# Patient Record
Sex: Male | Born: 1940
Health system: Southern US, Community
[De-identification: ages and names within clinical notes are randomized; demographics above are authoritative.]

## PROBLEM LIST (undated history)

## (undated) DIAGNOSIS — M545 Low back pain: Secondary | ICD-10-CM

## (undated) DIAGNOSIS — E119 Type 2 diabetes mellitus without complications: Secondary | ICD-10-CM

## (undated) DIAGNOSIS — E1140 Type 2 diabetes mellitus with diabetic neuropathy, unspecified: Secondary | ICD-10-CM

## (undated) DIAGNOSIS — B354 Tinea corporis: Secondary | ICD-10-CM

## (undated) DIAGNOSIS — N529 Male erectile dysfunction, unspecified: Secondary | ICD-10-CM

## (undated) DIAGNOSIS — E1121 Type 2 diabetes mellitus with diabetic nephropathy: Secondary | ICD-10-CM

## (undated) DIAGNOSIS — Z0000 Encounter for general adult medical examination without abnormal findings: Secondary | ICD-10-CM

## (undated) DIAGNOSIS — E785 Hyperlipidemia, unspecified: Secondary | ICD-10-CM

## (undated) DIAGNOSIS — I10 Essential (primary) hypertension: Secondary | ICD-10-CM

## (undated) DIAGNOSIS — R972 Elevated prostate specific antigen [PSA]: Secondary | ICD-10-CM

## (undated) DIAGNOSIS — E782 Mixed hyperlipidemia: Secondary | ICD-10-CM

## (undated) DIAGNOSIS — R3911 Hesitancy of micturition: Secondary | ICD-10-CM

## (undated) DIAGNOSIS — D649 Anemia, unspecified: Secondary | ICD-10-CM

## (undated) HISTORY — DX: Hesitancy of micturition: R39.11

## (undated) HISTORY — DX: Type 2 diabetes mellitus with diabetic nephropathy: E11.21

## (undated) HISTORY — DX: Elevated prostate specific antigen (PSA): R97.20

## (undated) HISTORY — DX: Low back pain: M54.5

## (undated) HISTORY — DX: Type 2 diabetes mellitus with diabetic neuropathy, unspecified: E11.40

## (undated) HISTORY — DX: Anemia, unspecified: D64.9

## (undated) HISTORY — DX: Encounter for general adult medical examination without abnormal findings: Z00.00

## (undated) HISTORY — DX: Hyperlipidemia, unspecified: E78.5

## (undated) HISTORY — PX: CATARACT EXTRACTION, BILATERAL: SHX1313

## (undated) HISTORY — DX: Type 2 diabetes mellitus without complications: E11.9

## (undated) HISTORY — DX: Mixed hyperlipidemia: E78.2

## (undated) HISTORY — PX: SKIN GRAFT: SHX250

## (undated) HISTORY — DX: Essential (primary) hypertension: I10

## (undated) HISTORY — DX: Male erectile dysfunction, unspecified: N52.9

## (undated) HISTORY — DX: Tinea corporis: B35.4

## (undated) HISTORY — PX: EYE SURGERY: SHX253

---

## 1988-06-05 DIAGNOSIS — E119 Type 2 diabetes mellitus without complications: Secondary | ICD-10-CM

## 1988-06-05 HISTORY — DX: Type 2 diabetes mellitus without complications: E11.9

## 1998-01-11 ENCOUNTER — Encounter: Admission: RE | Admit: 1998-01-11 | Discharge: 1998-01-11 | Payer: Self-pay | Admitting: Family Medicine

## 1998-02-04 ENCOUNTER — Encounter: Admission: RE | Admit: 1998-02-04 | Discharge: 1998-02-04 | Payer: Self-pay | Admitting: Family Medicine

## 1998-03-30 ENCOUNTER — Encounter: Admission: RE | Admit: 1998-03-30 | Discharge: 1998-03-30 | Payer: Self-pay | Admitting: Sports Medicine

## 1998-08-24 ENCOUNTER — Encounter: Admission: RE | Admit: 1998-08-24 | Discharge: 1998-08-24 | Payer: Self-pay | Admitting: Family Medicine

## 1998-11-16 ENCOUNTER — Emergency Department (HOSPITAL_COMMUNITY): Admission: EM | Admit: 1998-11-16 | Discharge: 1998-11-16 | Payer: Self-pay | Admitting: Emergency Medicine

## 1999-02-03 ENCOUNTER — Encounter: Admission: RE | Admit: 1999-02-03 | Discharge: 1999-02-03 | Payer: Self-pay | Admitting: Family Medicine

## 1999-03-10 ENCOUNTER — Encounter: Admission: RE | Admit: 1999-03-10 | Discharge: 1999-03-10 | Payer: Self-pay | Admitting: Family Medicine

## 1999-04-08 ENCOUNTER — Encounter: Admission: RE | Admit: 1999-04-08 | Discharge: 1999-04-08 | Payer: Self-pay | Admitting: Family Medicine

## 1999-09-13 ENCOUNTER — Encounter: Admission: RE | Admit: 1999-09-13 | Discharge: 1999-09-13 | Payer: Self-pay | Admitting: Family Medicine

## 1999-09-15 ENCOUNTER — Encounter: Admission: RE | Admit: 1999-09-15 | Discharge: 1999-09-15 | Payer: Self-pay | Admitting: Family Medicine

## 1999-09-19 ENCOUNTER — Encounter: Admission: RE | Admit: 1999-09-19 | Discharge: 1999-12-18 | Payer: Self-pay | Admitting: *Deleted

## 1999-10-20 ENCOUNTER — Encounter: Admission: RE | Admit: 1999-10-20 | Discharge: 1999-10-20 | Payer: Self-pay | Admitting: Family Medicine

## 2000-01-28 ENCOUNTER — Emergency Department (HOSPITAL_COMMUNITY): Admission: EM | Admit: 2000-01-28 | Discharge: 2000-01-28 | Payer: Self-pay | Admitting: Emergency Medicine

## 2000-05-31 ENCOUNTER — Encounter: Admission: RE | Admit: 2000-05-31 | Discharge: 2000-05-31 | Payer: Self-pay | Admitting: Family Medicine

## 2000-08-15 ENCOUNTER — Encounter: Admission: RE | Admit: 2000-08-15 | Discharge: 2000-08-15 | Payer: Self-pay | Admitting: Family Medicine

## 2000-10-31 ENCOUNTER — Encounter: Payer: Self-pay | Admitting: Emergency Medicine

## 2000-10-31 ENCOUNTER — Emergency Department (HOSPITAL_COMMUNITY): Admission: EM | Admit: 2000-10-31 | Discharge: 2000-10-31 | Payer: Self-pay | Admitting: Emergency Medicine

## 2001-10-06 ENCOUNTER — Emergency Department (HOSPITAL_COMMUNITY): Admission: EM | Admit: 2001-10-06 | Discharge: 2001-10-06 | Payer: Self-pay | Admitting: *Deleted

## 2004-12-02 ENCOUNTER — Emergency Department (HOSPITAL_COMMUNITY): Admission: EM | Admit: 2004-12-02 | Discharge: 2004-12-03 | Payer: Self-pay | Admitting: Emergency Medicine

## 2004-12-29 ENCOUNTER — Ambulatory Visit (HOSPITAL_COMMUNITY): Admission: RE | Admit: 2004-12-29 | Discharge: 2004-12-30 | Payer: Self-pay | Admitting: Ophthalmology

## 2004-12-29 IMAGING — CR DG CHEST 2V
2 series · 2 of 2 positions shown · non-contrast
Comparison: Report dated [DATE].

CLINICAL DATA: Cough. Upper respiratory tract infection. Hypertension. Vitreous
hemorrhage. Preoperative evaluation.

CHEST - 2 VIEW

[view not recorded (1 of 2)]
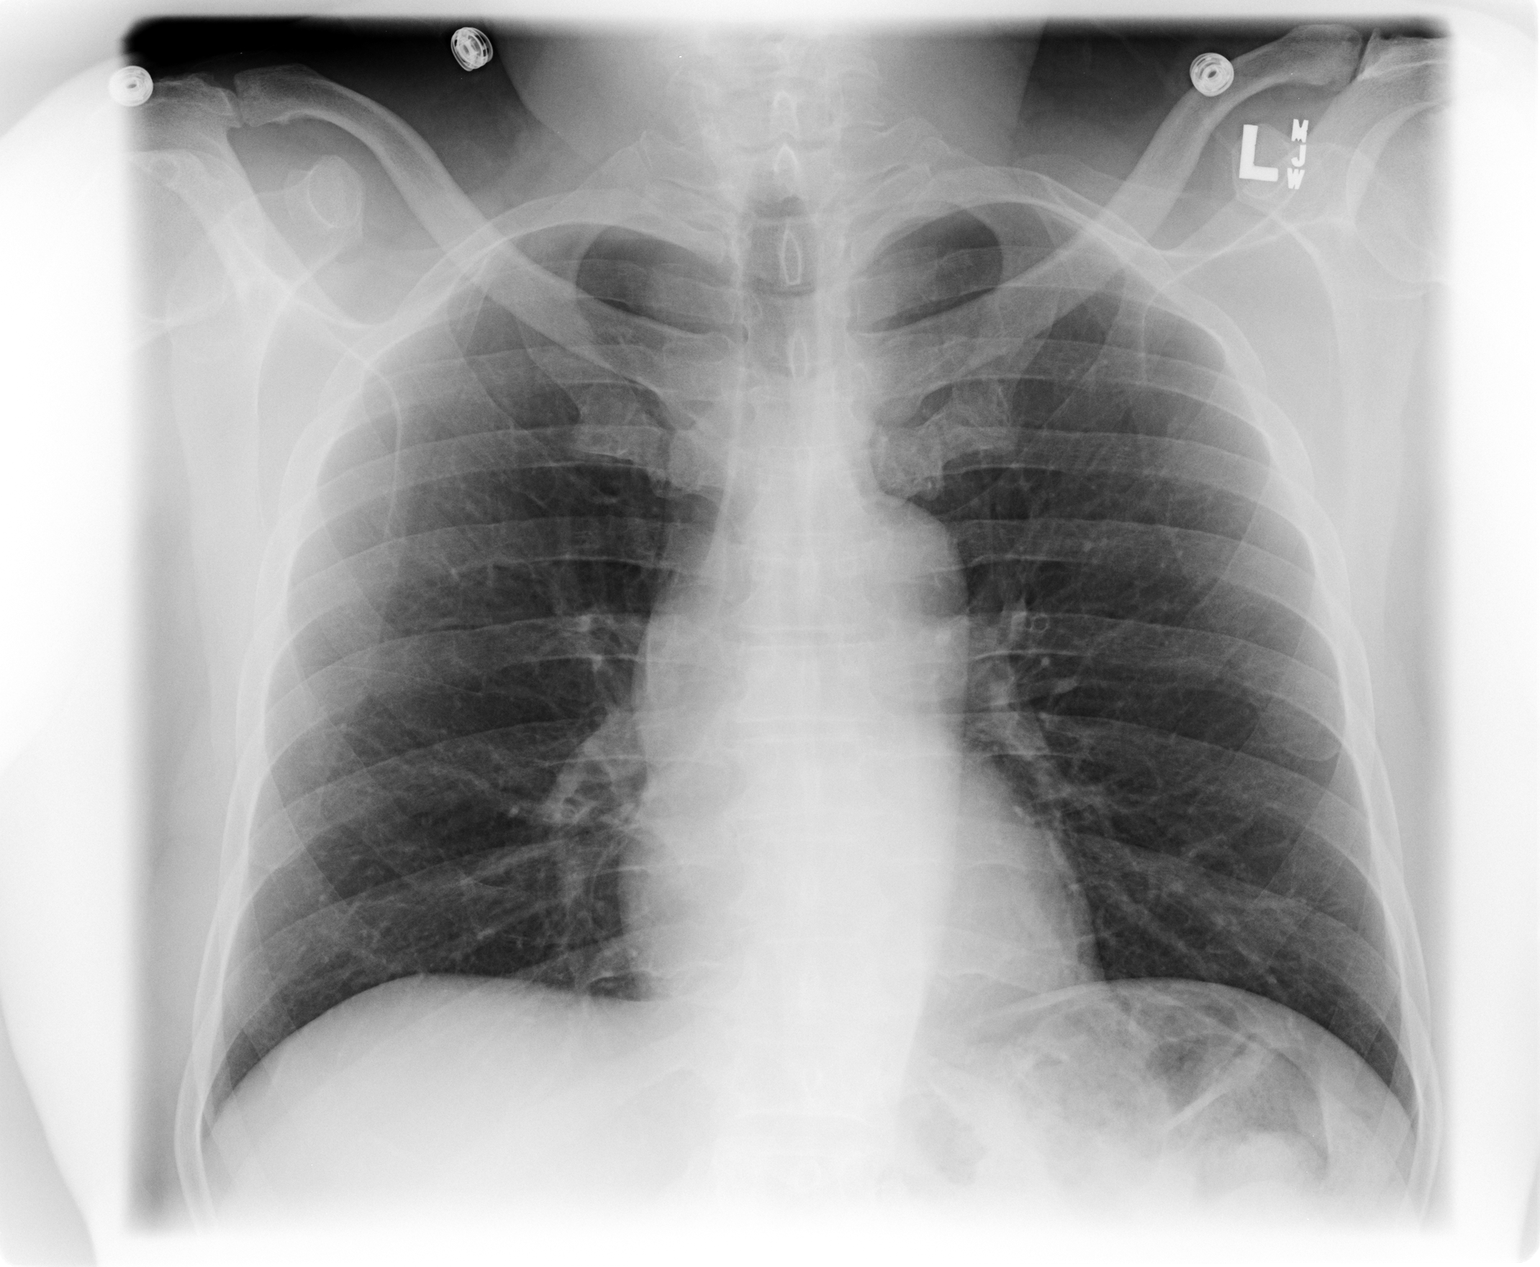

[view not recorded (2 of 2)]
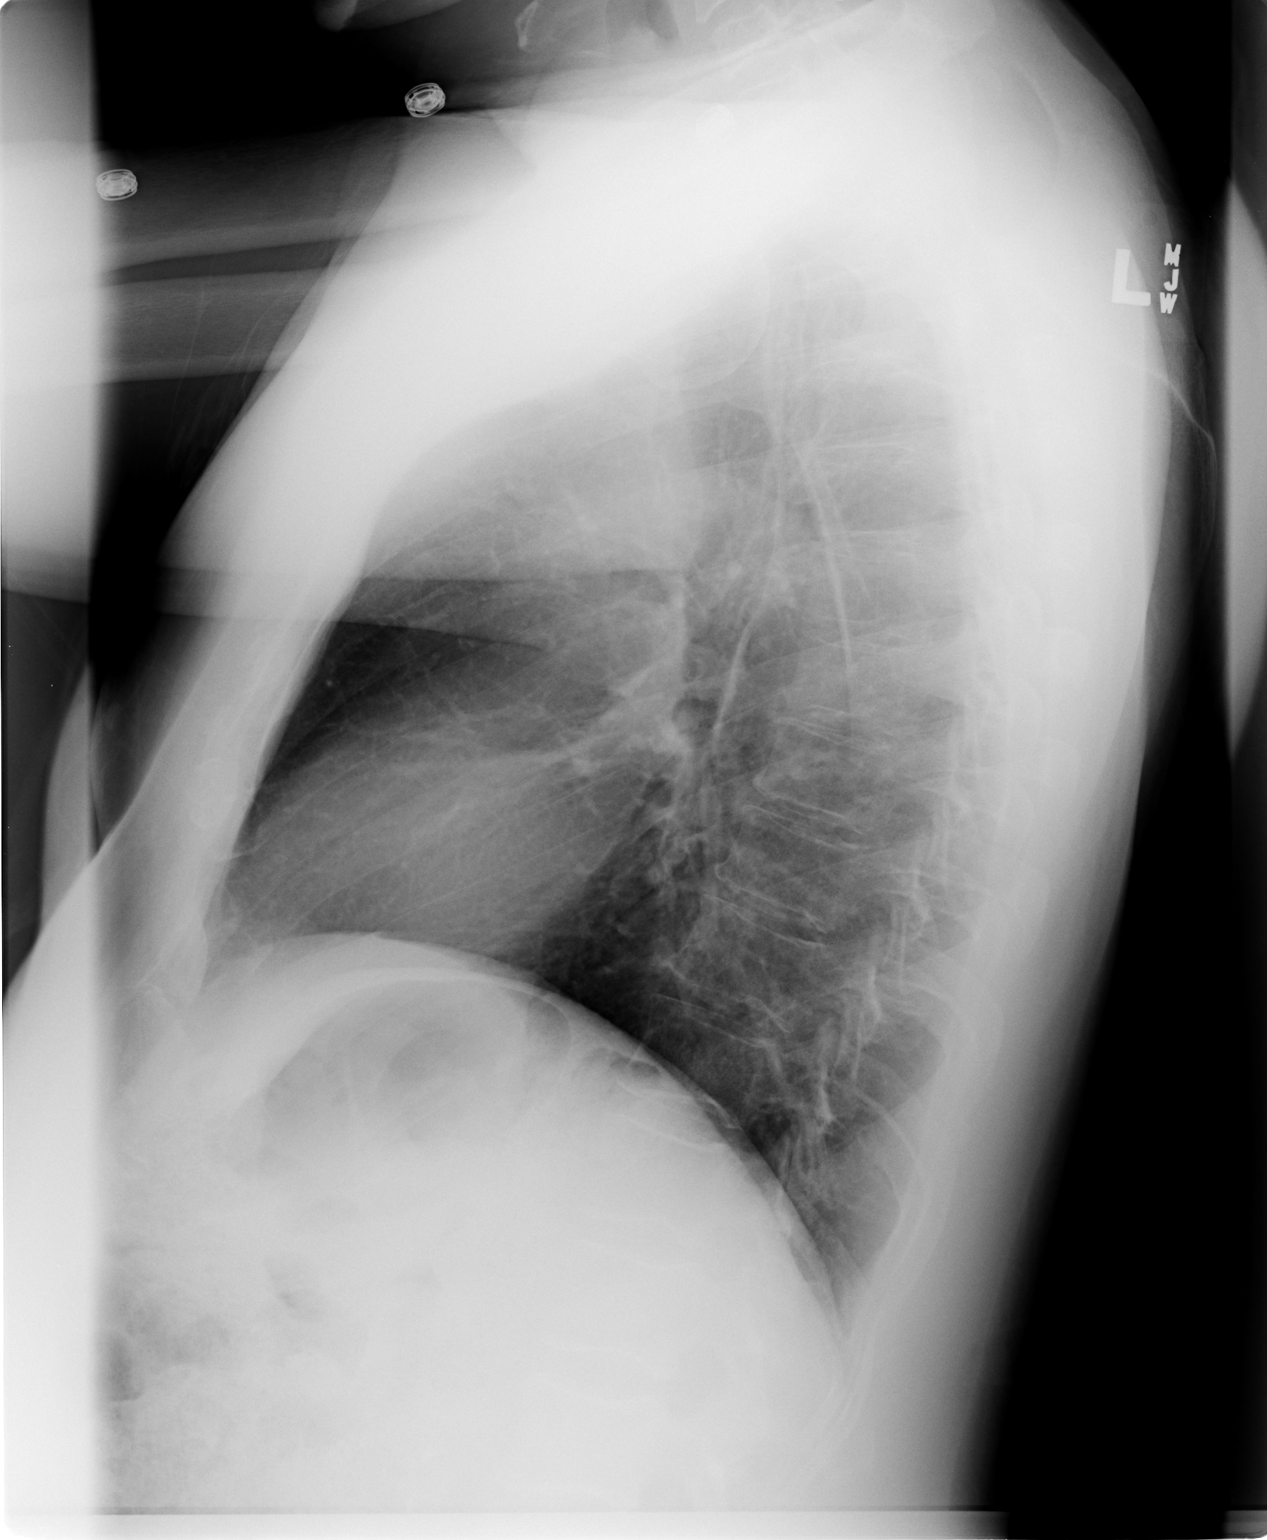

[2 of 2 positions shown; findings below may reference images not displayed]

FINDINGS: The heart remains normal in size. Mild diffuse peribronchial
thickening is again demonstrated. The aorta remains mildly tortuous with mild
prominence of the ascending thoracic aorta and aortic arch. Mild thoracic spine
degenerative changes.

IMPRESSION

1. Mild chronic bronchitic changes.

2. Previously described tortuous aorta with mild prominence of the ascending
thoracic aorta and aortic arch.

## 2005-02-21 ENCOUNTER — Ambulatory Visit (HOSPITAL_COMMUNITY): Admission: RE | Admit: 2005-02-21 | Discharge: 2005-02-22 | Payer: Self-pay | Admitting: Ophthalmology

## 2005-07-15 ENCOUNTER — Emergency Department (HOSPITAL_COMMUNITY): Admission: EM | Admit: 2005-07-15 | Discharge: 2005-07-15 | Payer: Self-pay | Admitting: Emergency Medicine

## 2008-06-15 ENCOUNTER — Ambulatory Visit: Payer: Self-pay | Admitting: *Deleted

## 2008-06-15 DIAGNOSIS — E1165 Type 2 diabetes mellitus with hyperglycemia: Secondary | ICD-10-CM

## 2008-06-15 DIAGNOSIS — E1151 Type 2 diabetes mellitus with diabetic peripheral angiopathy without gangrene: Secondary | ICD-10-CM | POA: Insufficient documentation

## 2008-06-15 DIAGNOSIS — E782 Mixed hyperlipidemia: Secondary | ICD-10-CM

## 2008-06-15 DIAGNOSIS — E1139 Type 2 diabetes mellitus with other diabetic ophthalmic complication: Secondary | ICD-10-CM

## 2008-06-15 DIAGNOSIS — E1149 Type 2 diabetes mellitus with other diabetic neurological complication: Secondary | ICD-10-CM

## 2008-06-15 DIAGNOSIS — I10 Essential (primary) hypertension: Secondary | ICD-10-CM | POA: Insufficient documentation

## 2008-06-15 HISTORY — DX: Mixed hyperlipidemia: E78.2

## 2008-06-15 LAB — CONVERTED CEMR LAB
ALT: 37 units/L (ref 0–53)
AST: 34 units/L (ref 0–37)
Albumin: 3.6 g/dL (ref 3.5–5.2)
Alkaline Phosphatase: 51 units/L (ref 39–117)
BUN: 26 mg/dL — ABNORMAL HIGH (ref 6–23)
Basophils Absolute: 0 10*3/uL (ref 0.0–0.1)
Basophils Relative: 0.3 % (ref 0.0–3.0)
CO2: 28 meq/L (ref 19–32)
Calcium: 9.5 mg/dL (ref 8.4–10.5)
Chloride: 107 meq/L (ref 96–112)
Creatinine, Ser: 1.1 mg/dL (ref 0.4–1.5)
Creatinine,U: 75.7 mg/dL
Eosinophils Absolute: 0.2 10*3/uL (ref 0.0–0.7)
Eosinophils Relative: 2.9 % (ref 0.0–5.0)
GFR calc Af Amer: 86 mL/min
GFR calc non Af Amer: 71 mL/min
Glucose, Bld: 296 mg/dL — ABNORMAL HIGH (ref 70–99)
HCT: 36.4 % — ABNORMAL LOW (ref 39.0–52.0)
Hemoglobin: 12.2 g/dL — ABNORMAL LOW (ref 13.0–17.0)
Hgb A1c MFr Bld: 10.1 % — ABNORMAL HIGH (ref 4.6–6.0)
Lymphocytes Relative: 21.4 % (ref 12.0–46.0)
MCHC: 33.5 g/dL (ref 30.0–36.0)
MCV: 86.3 fL (ref 78.0–100.0)
Microalb Creat Ratio: 236.5 mg/g — ABNORMAL HIGH (ref 0.0–30.0)
Microalb, Ur: 17.9 mg/dL — ABNORMAL HIGH (ref 0.0–1.9)
Monocytes Absolute: 0.5 10*3/uL (ref 0.1–1.0)
Monocytes Relative: 8.5 % (ref 3.0–12.0)
Neutro Abs: 4.1 10*3/uL (ref 1.4–7.7)
Neutrophils Relative %: 66.9 % (ref 43.0–77.0)
Platelets: 216 10*3/uL (ref 150–400)
Potassium: 4.5 meq/L (ref 3.5–5.1)
RBC: 4.22 M/uL (ref 4.22–5.81)
RDW: 12.6 % (ref 11.5–14.6)
Sodium: 140 meq/L (ref 135–145)
TSH: 0.9 microintl units/mL (ref 0.35–5.50)
Total Bilirubin: 0.9 mg/dL (ref 0.3–1.2)
Total Protein: 7.2 g/dL (ref 6.0–8.3)
WBC: 6.1 10*3/uL (ref 4.5–10.5)

## 2008-06-17 DIAGNOSIS — R809 Proteinuria, unspecified: Secondary | ICD-10-CM

## 2008-06-17 DIAGNOSIS — D649 Anemia, unspecified: Secondary | ICD-10-CM | POA: Insufficient documentation

## 2008-06-18 ENCOUNTER — Ambulatory Visit: Payer: Self-pay | Admitting: *Deleted

## 2008-06-18 DIAGNOSIS — N529 Male erectile dysfunction, unspecified: Secondary | ICD-10-CM

## 2008-06-18 DIAGNOSIS — R209 Unspecified disturbances of skin sensation: Secondary | ICD-10-CM

## 2008-06-19 LAB — CONVERTED CEMR LAB
Folate: 10.6 ng/mL
Testosterone: 476.37 ng/dL (ref 350.00–890)
Vitamin B-12: 497 pg/mL (ref 211–911)

## 2008-06-26 ENCOUNTER — Encounter (INDEPENDENT_AMBULATORY_CARE_PROVIDER_SITE_OTHER): Payer: Self-pay | Admitting: *Deleted

## 2008-07-03 ENCOUNTER — Ambulatory Visit: Payer: Self-pay | Admitting: Endocrinology

## 2008-07-03 ENCOUNTER — Telehealth (INDEPENDENT_AMBULATORY_CARE_PROVIDER_SITE_OTHER): Payer: Self-pay | Admitting: *Deleted

## 2008-07-21 ENCOUNTER — Ambulatory Visit: Payer: Self-pay | Admitting: *Deleted

## 2008-07-21 LAB — CONVERTED CEMR LAB
ALT: 27 units/L (ref 0–53)
AST: 25 units/L (ref 0–37)
Albumin: 3.6 g/dL (ref 3.5–5.2)
Alkaline Phosphatase: 52 units/L (ref 39–117)
BUN: 18 mg/dL (ref 6–23)
Basophils Absolute: 0 10*3/uL (ref 0.0–0.1)
Basophils Relative: 0 % (ref 0.0–3.0)
CO2: 29 meq/L (ref 19–32)
Calcium: 9.5 mg/dL (ref 8.4–10.5)
Chloride: 107 meq/L (ref 96–112)
Cholesterol: 133 mg/dL (ref 0–200)
Creatinine, Ser: 1.1 mg/dL (ref 0.4–1.5)
Eosinophils Absolute: 0.2 10*3/uL (ref 0.0–0.7)
Eosinophils Relative: 3.9 % (ref 0.0–5.0)
GFR calc Af Amer: 86 mL/min
GFR calc non Af Amer: 71 mL/min
Glucose, Bld: 312 mg/dL — ABNORMAL HIGH (ref 70–99)
HCT: 36.9 % — ABNORMAL LOW (ref 39.0–52.0)
HDL: 36 mg/dL — ABNORMAL LOW (ref >39.0)
Hemoglobin: 12.8 g/dL — ABNORMAL LOW (ref 13.0–17.0)
Iron: 82 ug/dL (ref 42–165)
LDL Cholesterol: 81 mg/dL (ref 0–99)
Lymphocytes Relative: 21.5 % (ref 12.0–46.0)
MCHC: 34.6 g/dL (ref 30.0–36.0)
MCV: 84.1 fL (ref 78.0–100.0)
Monocytes Absolute: 0.5 10*3/uL (ref 0.1–1.0)
Monocytes Relative: 8.3 % (ref 3.0–12.0)
Neutro Abs: 4.2 10*3/uL (ref 1.4–7.7)
Neutrophils Relative %: 66.3 % (ref 43.0–77.0)
Platelets: 228 10*3/uL (ref 150–400)
Potassium: 4.8 meq/L (ref 3.5–5.1)
RBC: 4.38 M/uL (ref 4.22–5.81)
RDW: 12.6 % (ref 11.5–14.6)
Saturation Ratios: 28.7 % (ref 20.0–50.0)
Sodium: 142 meq/L (ref 135–145)
Total Bilirubin: 0.8 mg/dL (ref 0.3–1.2)
Total CHOL/HDL Ratio: 3.7
Total Protein: 7.5 g/dL (ref 6.0–8.3)
Transferrin: 204.3 mg/dL — ABNORMAL LOW (ref >=212.0)
Triglycerides: 82 mg/dL (ref 0–149)
VLDL: 16 mg/dL (ref 0–40)
Vitamin B-12: 456 pg/mL (ref 211–911)
WBC: 6.2 10*3/uL (ref 4.5–10.5)

## 2008-08-25 ENCOUNTER — Ambulatory Visit: Payer: Self-pay | Admitting: Endocrinology

## 2008-08-25 DIAGNOSIS — J309 Allergic rhinitis, unspecified: Secondary | ICD-10-CM | POA: Insufficient documentation

## 2008-08-25 LAB — CONVERTED CEMR LAB: Hgb A1c MFr Bld: 9.7 % — ABNORMAL HIGH (ref 4.6–6.5)

## 2008-10-05 ENCOUNTER — Telehealth (INDEPENDENT_AMBULATORY_CARE_PROVIDER_SITE_OTHER): Payer: Self-pay | Admitting: *Deleted

## 2008-11-24 ENCOUNTER — Ambulatory Visit: Payer: Self-pay | Admitting: Endocrinology

## 2008-11-24 LAB — CONVERTED CEMR LAB: Hgb A1c MFr Bld: 9 % — ABNORMAL HIGH (ref 4.6–6.5)

## 2008-12-09 ENCOUNTER — Ambulatory Visit: Payer: Self-pay | Admitting: Internal Medicine

## 2008-12-17 ENCOUNTER — Telehealth (INDEPENDENT_AMBULATORY_CARE_PROVIDER_SITE_OTHER): Payer: Self-pay | Admitting: *Deleted

## 2009-02-04 ENCOUNTER — Telehealth: Payer: Self-pay | Admitting: Endocrinology

## 2009-02-05 ENCOUNTER — Ambulatory Visit: Payer: Self-pay | Admitting: Internal Medicine

## 2009-02-05 LAB — CONVERTED CEMR LAB
BUN: 27 mg/dL — ABNORMAL HIGH (ref 6–23)
CO2: 30 meq/L (ref 19–32)
Calcium: 9.7 mg/dL (ref 8.4–10.5)
Chloride: 107 meq/L (ref 96–112)
Creatinine, Ser: 1.3 mg/dL (ref 0.4–1.5)
GFR calc non Af Amer: 71.06 mL/min (ref >60)
Glucose, Bld: 273 mg/dL — ABNORMAL HIGH (ref 70–99)
Potassium: 4.4 meq/L (ref 3.5–5.1)
Sodium: 144 meq/L (ref 135–145)

## 2009-02-09 ENCOUNTER — Ambulatory Visit: Payer: Self-pay | Admitting: Internal Medicine

## 2009-02-09 DIAGNOSIS — J069 Acute upper respiratory infection, unspecified: Secondary | ICD-10-CM | POA: Insufficient documentation

## 2009-02-11 ENCOUNTER — Encounter: Payer: Self-pay | Admitting: Internal Medicine

## 2009-03-16 ENCOUNTER — Telehealth: Payer: Self-pay | Admitting: Internal Medicine

## 2009-04-07 ENCOUNTER — Ambulatory Visit: Payer: Self-pay | Admitting: Endocrinology

## 2009-04-07 LAB — CONVERTED CEMR LAB
Creatinine,U: 134.8 mg/dL
Microalb, Ur: 65.8 mg/dL — ABNORMAL HIGH (ref 0.0–1.9)

## 2009-04-15 ENCOUNTER — Ambulatory Visit: Payer: Self-pay | Admitting: Internal Medicine

## 2009-05-07 ENCOUNTER — Ambulatory Visit: Payer: Self-pay | Admitting: Endocrinology

## 2009-10-20 ENCOUNTER — Telehealth: Payer: Self-pay | Admitting: Internal Medicine

## 2009-11-05 ENCOUNTER — Ambulatory Visit: Payer: Self-pay | Admitting: Internal Medicine

## 2010-01-03 ENCOUNTER — Encounter: Payer: Self-pay | Admitting: Internal Medicine

## 2010-05-17 ENCOUNTER — Telehealth: Payer: Self-pay | Admitting: Internal Medicine

## 2010-05-19 ENCOUNTER — Ambulatory Visit: Payer: Self-pay | Admitting: Internal Medicine

## 2010-05-23 ENCOUNTER — Telehealth: Payer: Self-pay | Admitting: Internal Medicine

## 2010-06-08 ENCOUNTER — Ambulatory Visit: Admit: 2010-06-08 | Payer: Self-pay | Admitting: Endocrinology

## 2010-06-10 ENCOUNTER — Ambulatory Visit
Admission: RE | Admit: 2010-06-10 | Discharge: 2010-06-10 | Payer: Self-pay | Source: Home / Self Care | Attending: Endocrinology | Admitting: Endocrinology

## 2010-06-10 ENCOUNTER — Other Ambulatory Visit: Payer: Self-pay | Admitting: Endocrinology

## 2010-06-10 LAB — HEMOGLOBIN A1C: Hgb A1c MFr Bld: 10 % — ABNORMAL HIGH (ref 4.6–6.5)

## 2010-06-21 ENCOUNTER — Ambulatory Visit: Admit: 2010-06-21 | Payer: Self-pay | Admitting: Internal Medicine

## 2010-06-21 DIAGNOSIS — Z0289 Encounter for other administrative examinations: Secondary | ICD-10-CM

## 2010-07-05 NOTE — Medication Information (Signed)
Summary: Order for Diabetic Supplies  Order for Diabetic Supplies   Imported By: Laural Benes 01/11/2010 14:17:47  _____________________________________________________________________  External Attachment:    Type:   Image     Comment:   External Document

## 2010-07-05 NOTE — Progress Notes (Signed)
Summary: Lisinopril Refill  Phone Note Refill Request Message from:  Fax from Pharmacy on Oct 20, 2009 9:03 AM  Refills Requested: Medication #1:  LISINOPRIL-HYDROCHLOROTHIAZIDE 20-12.5 MG TABS 2 by mouth q am.   Dosage confirmed as above?Dosage Confirmed   Brand Name Necessary? No   Supply Requested: 1 month   Last Refilled: 08/09/2009  Method Requested: Electronic Next Appointment Scheduled: None Initial call taken by: Jiles Garter CMA,  Oct 20, 2009 9:04 AM  Follow-up for Phone Call        refill x 1.  needs ov for addt'l refills Follow-up by: D. Drema Pry DO,  Oct 20, 2009 12:10 PM  Additional Follow-up for Phone Call Additional follow up Details #1::        calll placed to patient at [phone], wife stated patient  was not available. She was advised to inform patient office visit was needed prior to future refills. Patients wife has scheduled office visit for 10/26/2009 Additional Follow-up by: Jiles Garter CMA,  Oct 20, 2009 12:27 PM    Prescriptions: LISINOPRIL-HYDROCHLOROTHIAZIDE 20-12.5 MG TABS (LISINOPRIL-HYDROCHLOROTHIAZIDE) 2 by mouth q am  #60 x 0   Entered by:   Jiles Garter CMA   Authorized by:   D. Drema Pry DO   Signed by:   Jiles Garter CMA on 10/20/2009   Method used:   Electronically to        [facility]. [id number]* (retail)       [address].       [place], [place]  [address]       Ph: [phone] or [phone]       Fax: [phone]   RxID:   [id number]

## 2010-07-07 NOTE — Progress Notes (Signed)
Summary: refill--amlodipine, carvedilol  Phone Note Refill Request Message from:  Fax from [facility] on May 17, 2010 10:29 AM  Refills Requested: Medication #1:  CARVEDILOL 25 MG TABS one by mouth two times a day   Dosage confirmed as above?Dosage Confirmed   Supply Requested: 3 months   Last Refilled: 04/11/2009   Notes: Request was for 12.5mg  1 twice a day. This was changed on 04/15/09  Medication #2:  AMLODIPINE BESYLATE 10 MG TABS one tablet once daily   Dosage confirmed as above?Dosage Confirmed   Supply Requested: 3 months   Last Refilled: 12/09/2008 Pt last seen 04/15/09.  Benazepril requested but pt was taken off of this 04/15/09.  Next Appointment Scheduled: 05/19/10 Dr Shawna Orleans Initial call taken by: Kelle Darting CMA Deborra Medina),  May 17, 2010 10:31 AM  Follow-up for Phone Call        ok to refill amlodipine and carvedilol plz confirm with pt he is taking lisinopril/hctz instead of benazepril Follow-up by: D. Drema Pry DO,  May 17, 2010 12:59 PM  Additional Follow-up for Phone Call Additional follow up Details #1::        Left message for pt to return my call. Gilmore Laroche Fergerson CMA ([place])  May 17, 2010 1:26 PM   Pt returned my call. Pt last took Lisinopril-HCT in 11/2009.  Still had bottle from June. Has recently been taking Benazepril.  Advised pt he should not be taking Benazepril.  Needs to start Lisinopril--HCT.  Will send Lisinopril-HCT refill to [facility] at pt's request.  Advised pt to keep f/u on 05/19/10 with Dr Shawna Orleans.  Called [facility] and spoke to [facility] to verify last refill dates.  Refilled Lisinopril--HCT on 10/20/09.  Benazepril has been deactivated on pt's profile.  Spoke to [place] at [facility] and verified that pt received refills on Amlodipine, Carvedilol, Lipitor and Benazepril last in 5/11 for 3 months supply on each. They did not have Lisinopril-HCT Rx. Med refilled.  Gilmore Laroche Fergerson CMA Deborra Medina)  May 17, 2010 2:38 PM       Prescriptions: LISINOPRIL-HYDROCHLOROTHIAZIDE 20-12.5 MG TABS (LISINOPRIL-HYDROCHLOROTHIAZIDE) 2 by mouth q am  #180 x 3   Entered by:   Kelle Darting CMA ([place])   Authorized by:   D. Drema Pry DO   Signed by:   Kelle Darting CMA ([place]) on 05/17/2010   Method used:   Faxed to ...       [facility] (mail-order)             , [place]         Ph: [phone]       Fax: [phone]   RxID:   [id number] CARVEDILOL 25 MG TABS (CARVEDILOL) one by mouth two times a day  #180 x 3   Entered by:   Kelle Darting CMA ([place])   Authorized by:   D. Drema Pry DO   Signed by:   Kelle Darting CMA ([place]) on 05/17/2010   Method used:   Faxed to ...       [facility] (mail-order)             , [place]         Ph: [phone]       Fax: [phone]   RxID:   [id number] AMLODIPINE BESYLATE 10 MG TABS (AMLODIPINE BESYLATE) one tablet once daily  #90 x 3   Entered by:   Kelle Darting CMA ([place])   Authorized by:   D. Drema Pry DO  Signed by:   Kelle Darting CMA ([place]) on 05/17/2010   Method used:   Faxed to ...       [facility] (mail-order)             , [place]         Ph: [phone]       Fax: [phone]   RxID:   [id number]
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
## 2010-07-07 NOTE — Assessment & Plan Note (Signed)
Summary: fu Jermaine Mitchell  #   Vital Signs:  Patient profile:   70 year old male Height:      69 inches (175.26 cm) Weight:      184.13 pounds (83.70 kg) BMI:     27.29 O2 Sat:      98 % on Room air Temp:     97.9 degrees F (36.61 degrees C) oral Pulse rate:   78 / minute Pulse rhythm:   regular BP sitting:   138 / 80  (left arm) Cuff size:   regular  Vitals Entered By: Rebeca Alert CMA Deborra Medina) (June 10, 2010 1:09 PM)  O2 Flow:  Room air CC: Follow up on DM/aj Is Patient Diabetic? Yes   Primary Provider:  Jennings Books DO  CC:  Follow up on DM/aj.  History of Present Illness: no cbg record, but states cbg's are often "high."  he sometimes skips the pm insulin dose, if cbg is well-controlled in the afternoon.  he says cbg is sometimes in the 80's, and he gets "jittery," but he does not know the time of day this happens.  he says his insulin is too expensive.    Current Medications (verified): 1)  Novolog Mix 70/30 70-30 % Susp (Insulin Aspart Prot & Aspart) .... Inject 26 Units Qam and 22 Units Qpm Under The Skin 2)  Carvedilol 25 Mg Tabs (Carvedilol) .... One By Mouth Two Times A Day 3)  Amlodipine Besylate 10 Mg Tabs (Amlodipine Besylate) .... One Tablet Once Daily 4)  Lipitor 80 Mg Tabs (Atorvastatin Calcium) .... One Tablet By Mouth Every Evening At Bedtime. 5)  Onetouch Ultra Test  Strp (Glucose Blood) .... Use 2-3 Times Daily As Directed 6)  Lisinopril-Hydrochlorothiazide 20-12.5 Mg Tabs (Lisinopril-Hydrochlorothiazide) .... 2 By Mouth Q Am 7)  Gabapentin 100 Mg Caps (Gabapentin) .... One To Two Tabs By Mouth At Bedtime As Needed  Allergies (verified): 1)  ! Codeine  Past History:  Past Medical History: Last updated: 05/19/2010 Diabetes mellitus, type II - 1990 Hyperlipidemia Hypertension (with some white coat HTN on top of essential HTN) diabetic retinopathy - sees optho every 6 months diabetic neuropathy   diabetic nephropathy    anemia ERECTILE DYSFUNCTION,  ORGANIC (ICD-607.84)    Review of Systems  The patient denies syncope.    Physical Exam  General:  normal appearance.   Pulses:  dorsalis pedis intact bilat.   Extremities:  no deformity.  no ulcer on the feet.  feet are of normal color and temp.  no edema. mycotic toenails.   Neurologic:  sensation is intact to touch on the feet.    Impression & Recommendations:  Problem # 1:  DIABETES MELLITUS, WITH RENAL COMPLICATIONS ([id number]) a1c is prob high due to missing pm insulin. therapy limited by pt's request for least expensive meds  Medications Added to Medication List This Visit: 1)  Humulin 70/30 70-30 % Susp (Insulin isophane & regular) .... 22 units two times a day (with the first and last meals of the day).  Other Orders: TLB-A1C / Hgb A1C (Glycohemoglobin) (83036-A1C) Est. Patient Level III [id number]  Patient Instructions: 1)  check your blood sugar 2 times a day.  vary the time of day when you check, between before the 3 meals, and at bedtime.  also check if you have symptoms of your blood sugar being too high or too low.  please keep a record of the readings and bring it to your next appointment here.  please call us  sooner if you are having low blood sugar episodes.   2)  return 1 month.   3)  tests are being ordered for you today.  a few days after the test(s), please call [phone] to hear your test results.   4)  change novolog 70/30 to humulin 70/30, 22 units two times a day (with breakfast, and with evening meal).  5)  blood tests are being ordered for you today.  please call [phone] to hear your test results. 6)  (update: i left message on phone-tree:  rx as we discussed) Prescriptions: HUMULIN 70/30 70-30 % SUSP (INSULIN ISOPHANE & REGULAR) 22 units two times a day (with the first and last meals of the day).  #2 vials x 11   Entered and Authorized by:   Donavan Foil MD   Signed by:   Donavan Foil MD on 06/10/2010   Method used:   Print then Give to Patient    RxID:   [id number] HUMULIN 70/30 70-30 % SUSP (INSULIN ISOPHANE & REGULAR) 22 units two times a day (with the first and last meals of the day).  #2 vials x 11   Entered and Authorized by:   Donavan Foil MD   Signed by:   Donavan Foil MD on 06/10/2010   Method used:   Electronically to        [facility]. [id number]* (retail)       [place].       [place], [place]  [address]       Ph: [phone] or [phone]       Fax: [phone]   RxID:   [id number]    Orders Added: 1)  TLB-A1C / Hgb A1C (Glycohemoglobin) [83036-A1C] 2)  Est. Patient Level III [id number]

## 2010-07-07 NOTE — Progress Notes (Signed)
Summary: refill lisinopril-hctz denied  Phone Note Refill Request Message from:  Fax from Pharmacy on May 23, 2010 9:10 AM  Refills Requested: Medication #1:  LISINOPRIL-HYDROCHLOROTHIAZIDE 20-12.5 MG TABS 2 by mouth q am   Dosage confirmed as above?Dosage Confirmed   Supply Requested: 60   Last Refilled: 10/20/2009 Received electronic refill request from [facility].  Med refilled to [facility] on 05/17/10 #180. Is this an automatic request or is pt needing a  30 day supply sent to local pharmacy? Left message on machine to return my call.  Next Appointment Scheduled: 06-21-09 Dr Shawna Orleans Initial call taken by: Kelle Darting CMA Deborra Medina),  May 23, 2010 9:11 AM  Follow-up for Phone Call        Left message on machine to return my call. Gilmore Laroche Fergerson CMA Deborra Medina)  May 24, 2010 9:47 AM   Pt has not returned my call. Refill denied. Gilmore Laroche Fergerson CMA Deborra Medina)  May 25, 2010 11:34 AM

## 2010-07-11 ENCOUNTER — Ambulatory Visit: Payer: Self-pay | Admitting: Endocrinology

## 2010-10-21 NOTE — Op Note (Signed)
NAME:  Jermaine Mitchell, Jermaine Mitchell               ACCOUNT NO.:  [id number]   MEDICAL RECORD NO.:  [id number]          PATIENT TYPE:  OIB   LOCATION:  5707                         FACILITY:  [facility]   PHYSICIAN:  Chrystie Nose. Zigmund Daniel, M.D. 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   PROCEDURES:  Pars plana vitrectomy, retinal photocoagulation, membrane peel,  intravitreal Kenalog injection, right eye.   SURGEON:  Chrystie Nose. Zigmund Daniel, M.D.   ASSISTANT:  Deatra Ina, MA   ANESTHESIA:  General.   DETAILS:  Usual prep and drape, peritomies at 8, 10 and 2 o'clock.  The 5-mm  infusion port anchored into place at 8 o'clock, the lighted pick and the  cutter were placed at 10 and 2 o'clock, respectively.  Provisc placed on the  corneal surface and the BIOM viewing system moved into place.  Pars plana  vitrectomy was performed removing all vitreous and vitreous blood from the  central vitreous cavity.  Posterior traction was seen along the upper arcade  and the macular region.  This area was circumcised with the vitreous cutter  from the outside in.  Sharp and blunt dissection was performed with vitreous  scissors to remove fingers of the membrane on the upper arcade and nasal  arcades.  The stump of vitreous on the disk was removed with the with the  vitreous forceps.  Membranes were peeled the posterior segment.  Scleral depression was used to gain access to the peripheral vitreous base  and the vitreous base was trimmed for 360 degrees.  All blood was vacuumed  from the macular surface.  The endolaser was positioned in the eye, 753  burns were placed around the retinal periphery with a power 1000 mW, 1000  microns each and 0.1 second  each.  A washout procedure was performed.  Decadron 0.1 mL was injected in the vitreous cavity overlying the macular  region.  The instruments were removed from the eye and 9-0 nylon was used to  close the sclerotomy sites.  They were tested and found to be tight.  The  conjunctiva was closed with wet-field cautery.  Polymyxin and gentamicin  were irrigated into Tenon's space.  Atropine solution was applied, Decadron,  10 mg was injected into the lower subconjunctival space.  TobraDex  ophthalmic ointment, a patch and shield were placed.  The closing pressure  was 10 with a Barraquer keratometer.   COMPLICATIONS:  None.   DURATION:  One  hour.   The patient is awakened and taken to recovery in satisfactory condition.  The patch and shield were placed.       JDM/MEDQ  D:  12/29/2004  T:  12/30/2004  Job:  [id number]

## 2010-10-21 NOTE — Op Note (Signed)
NAME:  [name], Jermaine Mitchell               ACCOUNT NO.:  [id number]   MEDICAL RECORD NO.:  [id number]          PATIENT TYPE:  OIB   LOCATION:  5727                         FACILITY:  [facility]   PHYSICIAN:  Chrystie Nose. Zigmund Daniel, M.D. DATE OF BIRTH:  April 09, 1941   DATE OF PROCEDURE:  02/21/2005  DATE OF DISCHARGE:                                 OPERATIVE REPORT   ADMISSION DIAGNOSES:  1.  Combined traction rhegmatogenous retinal detachment, left eye.  2.  Proliferative diabetic retinopathy.  3.  Vitreous hemorrhage, left eye.   PROCEDURES:  Pars plana vitrectomy with sectioning of vitreous membranes,  retinal photocoagulation, gas-fluid exchange, membrane peel,  perfluoropropane 16% injection, left eye.   SURGEON:  Tempie Hoist, MD.   ASSISTANT:  Deatra Ina, MA   ANESTHESIA:  General.   DETAILS:  Usual prep and drape, peritomies at 10, 2 and 4 o'clock, 5-mm  infusion port anchored into place at 4 o'clock.  The Provisc was placed on  the corneal surface.  the vitrectomy cutter and the lighted pick were placed  at 2 at 10 o'clock, respectively.  The pars plana vitrectomy was begun just  behind the crystalline lens.  Blood and vitreous were carefully removed  under low suction and rapid cutting.  Large areas of vitreal and macular  traction were seen above the fovea and below the fovea.  A large wolf jaw  detachment was noted along the lower arcade which extended onto the disk and  nasally.  All membranes were sectioned with vitreous MPC scissors.  A break  was discovered along the lower arcade.  Blood was vacuumed from the macular  surface.  The vitrectomy was carried into the far periphery and all  anterior/posterior traction was relieved.  The vitreous base was trimmed.  The endolaser was positioned in the eye and 999 burns placed around the  retinal periphery with a power of 1000 milliwatts and 1000 microns each,  duration -- 0.1 seconds each.  The area of rhegmatogenous detachment along  the lower arcade was surrounded with laser.  A total gas-fluid exchange was  then carried out and fluid was removed from the posterior pole.  The  perfluoropropane was mixed to 16%, additional fluid removed with a [facility] brush.  C3F8 16% was exchanged for intravitreal gas.  The  instruments were removed from the eye and 9-0 nylon was used to close the  sclerotomy sites; they were found to be tight with Weck-cel test.  Polymyxin  and gentamicin were irrigated into Tenon's ounces space and atropine  solution was applied.  Decadron 10 mg was injected into the lower  subconjunctival space and Marcaine was injected around the globe for postop  pain.  TobraDex ophthalmic ointment, a patch and shield were placed.  Closing pressure was 10 with a Barraquer keratometer.   COMPLICATIONS:  None.   DURATION:  One hour.      Chrystie Nose. Zigmund Daniel, M.D.  Electronically Signed     JDM/MEDQ  D:  02/21/2005  T:  02/22/2005  Job:  [id number]

## 2011-04-14 ENCOUNTER — Encounter: Payer: Self-pay | Admitting: Internal Medicine

## 2011-04-14 ENCOUNTER — Ambulatory Visit (INDEPENDENT_AMBULATORY_CARE_PROVIDER_SITE_OTHER): Payer: Medicare PPO | Admitting: Internal Medicine

## 2011-04-14 DIAGNOSIS — E119 Type 2 diabetes mellitus without complications: Secondary | ICD-10-CM

## 2011-04-14 DIAGNOSIS — E785 Hyperlipidemia, unspecified: Secondary | ICD-10-CM

## 2011-04-14 DIAGNOSIS — I10 Essential (primary) hypertension: Secondary | ICD-10-CM

## 2011-04-14 DIAGNOSIS — J069 Acute upper respiratory infection, unspecified: Secondary | ICD-10-CM

## 2011-04-14 MED ORDER — INSULIN NPH ISOPHANE & REGULAR (70-30) 100 UNIT/ML ~~LOC~~ SUSP: SUBCUTANEOUS | Status: DC

## 2011-04-14 MED ORDER — AMLODIPINE BESYLATE 10 MG PO TABS: 10.0000 mg | ORAL_TABLET | Freq: Every day | ORAL | Status: DC

## 2011-04-14 MED ORDER — ATORVASTATIN CALCIUM 80 MG PO TABS: 80.0000 mg | ORAL_TABLET | Freq: Every day | ORAL | Status: DC

## 2011-04-14 MED ORDER — GABAPENTIN 100 MG PO CAPS: ORAL_CAPSULE | ORAL | Status: DC

## 2011-04-14 MED ORDER — LISINOPRIL-HYDROCHLOROTHIAZIDE 20-25 MG PO TABS: 2.0000 | ORAL_TABLET | ORAL | Status: DC

## 2011-04-14 MED ORDER — AZITHROMYCIN 250 MG PO TABS: ORAL_TABLET | ORAL | Status: AC

## 2011-04-14 MED ORDER — CARVEDILOL 25 MG PO TABS: 25.0000 mg | ORAL_TABLET | Freq: Two times a day (BID) | ORAL | Status: DC
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
## 2011-04-14 NOTE — Assessment & Plan Note (Signed)
Attempt zpak. Followup if no improvement or worsening.

## 2011-04-14 NOTE — Assessment & Plan Note (Signed)
Obtain lft. Not currently fasting for lipid. Obtain at future visit

## 2011-04-14 NOTE — Assessment & Plan Note (Signed)
suboptimal control. Resume bp medications. Monitor bp as outpt and follow up in one week or sooner if needed.

## 2011-04-14 NOTE — Assessment & Plan Note (Signed)
Obtain cbc, chem7, and a1c. Fasting fsbs goal at least <130.

## 2011-04-15 LAB — CBC
HCT: 37.7 % — ABNORMAL LOW (ref 39.0–52.0)
Hemoglobin: 12.7 g/dL — ABNORMAL LOW (ref 13.0–17.0)
MCH: 27.9 pg (ref 26.0–34.0)
MCHC: 33.7 g/dL (ref 30.0–36.0)
RDW: 13.5 % (ref 11.5–15.5)

## 2011-04-15 LAB — BASIC METABOLIC PANEL
BUN: 23 mg/dL (ref 6–23)
Calcium: 9.2 mg/dL (ref 8.4–10.5)
Creat: 1.38 mg/dL — ABNORMAL HIGH (ref 0.50–1.35)
Glucose, Bld: 166 mg/dL — ABNORMAL HIGH (ref 70–99)

## 2011-04-15 LAB — HEMOGLOBIN A1C
Hgb A1c MFr Bld: 9.2 % — ABNORMAL HIGH (ref <5.7)
Mean Plasma Glucose: 217 mg/dL — ABNORMAL HIGH (ref <117)

## 2011-04-15 LAB — MICROALBUMIN / CREATININE URINE RATIO
Creatinine, Urine: 144.8 mg/dL
Microalb Creat Ratio: 804.4 mg/g — ABNORMAL HIGH (ref 0.0–30.0)
Microalb, Ur: 116.48 mg/dL — ABNORMAL HIGH (ref 0.00–1.89)

## 2011-04-15 LAB — HEPATIC FUNCTION PANEL
ALT: 21 U/L (ref 0–53)
Albumin: 3.8 g/dL (ref 3.5–5.2)
Indirect Bilirubin: 0.6 mg/dL (ref 0.0–0.9)
Total Protein: 7.1 g/dL (ref 6.0–8.3)

## 2011-04-21 ENCOUNTER — Encounter: Payer: Self-pay | Admitting: Internal Medicine

## 2011-04-21 ENCOUNTER — Ambulatory Visit (INDEPENDENT_AMBULATORY_CARE_PROVIDER_SITE_OTHER): Payer: Medicare PPO | Admitting: Internal Medicine

## 2011-04-21 VITALS — BP 140/70 | HR 75 | Temp 98.0°F | Resp 18 | Ht 69.0 in | Wt 186.0 lb

## 2011-04-21 DIAGNOSIS — I10 Essential (primary) hypertension: Secondary | ICD-10-CM

## 2011-04-21 DIAGNOSIS — E119 Type 2 diabetes mellitus without complications: Secondary | ICD-10-CM

## 2011-04-21 DIAGNOSIS — E785 Hyperlipidemia, unspecified: Secondary | ICD-10-CM

## 2011-04-21 LAB — LIPID PANEL
HDL: 29 mg/dL — ABNORMAL LOW (ref >39)
LDL Cholesterol: 88 mg/dL (ref 0–99)
Total CHOL/HDL Ratio: 5 Ratio
Triglycerides: 133 mg/dL (ref <150)
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
## 2011-04-21 NOTE — Patient Instructions (Signed)
Please increase your insulin 2 units every 3 days until your before eating blood sugars are less than 130 without dropping low. Please schedule chem7, a1c 250.0 prior to next visit

## 2011-04-26 NOTE — Assessment & Plan Note (Signed)
Obtain lipid profile. 

## 2011-04-26 NOTE — Assessment & Plan Note (Signed)
suboptimal control. Long discussion held regarding mechanism and dosing of insulin. Provided with guidelines for titrating insulin based on fsbs. Call with any questions or concern re blood sugar prior to next appt.

## 2011-04-26 NOTE — Assessment & Plan Note (Signed)
Improving control since resuming medications. Continue current dosing, monitor bp and report values for review

## 2011-04-28 ENCOUNTER — Other Ambulatory Visit: Payer: Self-pay | Admitting: Internal Medicine

## 2011-04-28 DIAGNOSIS — E785 Hyperlipidemia, unspecified: Secondary | ICD-10-CM

## 2011-07-25 ENCOUNTER — Ambulatory Visit: Payer: Medicare PPO | Admitting: Internal Medicine

## 2011-07-28 ENCOUNTER — Ambulatory Visit: Payer: Medicare PPO | Admitting: Internal Medicine

## 2011-07-28 DIAGNOSIS — Z0289 Encounter for other administrative examinations: Secondary | ICD-10-CM

## 2011-11-22 ENCOUNTER — Telehealth: Payer: Self-pay | Admitting: Internal Medicine

## 2011-11-22 MED ORDER — LISINOPRIL-HYDROCHLOROTHIAZIDE 20-25 MG PO TABS: 2.0000 | ORAL_TABLET | ORAL | Status: DC

## 2011-11-22 MED ORDER — AMLODIPINE BESYLATE 10 MG PO TABS: 10.0000 mg | ORAL_TABLET | Freq: Every day | ORAL | Status: DC

## 2011-11-22 MED ORDER — CARVEDILOL 25 MG PO TABS: 25.0000 mg | ORAL_TABLET | Freq: Two times a day (BID) | ORAL | Status: DC

## 2011-11-22 NOTE — Telephone Encounter (Signed)
30 day supply of each medication sent to pharmacy x no refills with note that office visit is needed for further refills.  Pt was due for follow up in February.  Please call pt to arrange appt.

## 2011-11-22 NOTE — Telephone Encounter (Signed)
Refill- lisino hctz 20-25mg  tab. Take two tablets by mouth in the morning. Qty 60 last fill 5.15.13  Refill- norvasc 10mg  tab. Take one tablet by mouth every day. Qty 30 last fill 5.15.13  Refill- carvedilol 25mg  tab. Take one tablet by mouth twice daily with meals. Qty 60 last fill 5.15.13

## 2011-11-23 NOTE — Telephone Encounter (Signed)
Left message with a man for patient to return my call.

## 2011-11-27 NOTE — Telephone Encounter (Signed)
Left message with a man for patient to return my call.

## 2011-12-12 ENCOUNTER — Ambulatory Visit: Payer: Medicare PPO | Admitting: Internal Medicine

## 2011-12-20 ENCOUNTER — Encounter: Payer: Self-pay | Admitting: Internal Medicine

## 2011-12-20 ENCOUNTER — Ambulatory Visit (INDEPENDENT_AMBULATORY_CARE_PROVIDER_SITE_OTHER): Payer: Medicare PPO | Admitting: Internal Medicine

## 2011-12-20 VITALS — BP 191/95 | HR 66 | Temp 98.0°F | Resp 16 | Wt 183.5 lb

## 2011-12-20 DIAGNOSIS — E1149 Type 2 diabetes mellitus with other diabetic neurological complication: Secondary | ICD-10-CM

## 2011-12-20 DIAGNOSIS — I10 Essential (primary) hypertension: Secondary | ICD-10-CM

## 2011-12-20 DIAGNOSIS — E119 Type 2 diabetes mellitus without complications: Secondary | ICD-10-CM

## 2011-12-20 DIAGNOSIS — E1142 Type 2 diabetes mellitus with diabetic polyneuropathy: Secondary | ICD-10-CM

## 2011-12-20 DIAGNOSIS — E785 Hyperlipidemia, unspecified: Secondary | ICD-10-CM

## 2011-12-20 LAB — LIPID PANEL
HDL: 29 mg/dL — ABNORMAL LOW (ref >39)
LDL Cholesterol: 89 mg/dL (ref 0–99)
Total CHOL/HDL Ratio: 4.8 Ratio
VLDL: 21 mg/dL (ref 0–40)

## 2011-12-20 LAB — CBC WITH DIFFERENTIAL/PLATELET
HCT: 34.2 % — ABNORMAL LOW (ref 39.0–52.0)
Hemoglobin: 11.9 g/dL — ABNORMAL LOW (ref 13.0–17.0)
Lymphocytes Relative: 22 % (ref 12–46)
MCHC: 34.8 g/dL (ref 30.0–36.0)
MCV: 82.8 fL (ref 78.0–100.0)
Monocytes Absolute: 0.5 10*3/uL (ref 0.1–1.0)
Monocytes Relative: 8 % (ref 3–12)
Neutro Abs: 4 10*3/uL (ref 1.7–7.7)

## 2011-12-20 LAB — HEPATIC FUNCTION PANEL
Albumin: 4 g/dL (ref 3.5–5.2)
Total Bilirubin: 0.5 mg/dL (ref 0.3–1.2)

## 2011-12-20 LAB — BASIC METABOLIC PANEL
BUN: 27 mg/dL — ABNORMAL HIGH (ref 6–23)
Chloride: 105 mEq/L (ref 96–112)
Glucose, Bld: 232 mg/dL — ABNORMAL HIGH (ref 70–99)
Potassium: 4.5 mEq/L (ref 3.5–5.3)

## 2011-12-20 MED ORDER — AMLODIPINE BESYLATE 10 MG PO TABS: 10.0000 mg | ORAL_TABLET | Freq: Every day | ORAL | Status: DC

## 2011-12-20 MED ORDER — ATORVASTATIN CALCIUM 80 MG PO TABS: 80.0000 mg | ORAL_TABLET | Freq: Every day | ORAL | Status: DC

## 2011-12-20 MED ORDER — CARVEDILOL 25 MG PO TABS: 25.0000 mg | ORAL_TABLET | Freq: Two times a day (BID) | ORAL | Status: DC

## 2011-12-20 MED ORDER — INSULIN NPH ISOPHANE & REGULAR (70-30) 100 UNIT/ML ~~LOC~~ SUSP: SUBCUTANEOUS | Status: DC

## 2011-12-20 MED ORDER — GABAPENTIN 100 MG PO CAPS: ORAL_CAPSULE | ORAL | Status: DC

## 2011-12-20 MED ORDER — LISINOPRIL-HYDROCHLOROTHIAZIDE 20-25 MG PO TABS: 2.0000 | ORAL_TABLET | ORAL | Status: DC

## 2011-12-20 MED ORDER — NIACIN ER 500 MG PO CPCR: 500.0000 mg | ORAL_CAPSULE | Freq: Every day | ORAL | Status: DC

## 2011-12-20 NOTE — Assessment & Plan Note (Signed)
suboptimal control. Asx. Resume medication. Monitor bp and call results to clinic within 1-2 weeks.
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
## 2011-12-20 NOTE — Patient Instructions (Signed)
Please schedule labs prior to next visit Chem7, a1c-250.00 

## 2011-12-20 NOTE — Assessment & Plan Note (Signed)
Obtain lipid/lft. 

## 2011-12-20 NOTE — Assessment & Plan Note (Signed)
suboptimal control complicated by noncompliance. RF's provided. Strongly encouraged to focus on appropriate diabetic diet with regular cardio exercise (has treadmill at home not being used). Obtain cbc, chem7, a1c, urine microalbumin. States will call optho for his f/u exam. Explained the potential complications of uncontrolled diabetes.

## 2011-12-21 LAB — MICROALBUMIN / CREATININE URINE RATIO
Microalb Creat Ratio: 80.2 mg/g — ABNORMAL HIGH (ref 0.0–30.0)
Microalb, Ur: 12.08 mg/dL — ABNORMAL HIGH (ref 0.00–1.89)

## 2012-01-17 ENCOUNTER — Telehealth: Payer: Self-pay | Admitting: *Deleted

## 2012-01-17 DIAGNOSIS — E119 Type 2 diabetes mellitus without complications: Secondary | ICD-10-CM

## 2012-01-17 DIAGNOSIS — D649 Anemia, unspecified: Secondary | ICD-10-CM

## 2012-01-17 NOTE — Telephone Encounter (Signed)
Second attempt to reach patient via phone, [facility] with contact name & number for pt call back to discuss results & further MD instructions; did inform pt that MD requesting 3-wk daily log of CBGs to be kept. Future lab orders placed for 10.2013/SLS

## 2012-01-17 NOTE — Telephone Encounter (Signed)
Message copied by Rockwell Germany on Wed Jan 17, 2012  5:15 PM ------      Message from: Burnice Logan      Created: Thu Dec 28, 2011  9:44 PM       Blood sugar avg above 200. Poorly controlled. Need blood sugar log in ~3wks. pls add cbc, b12, iron dx-anemia to next labs

## 2012-02-08 NOTE — Telephone Encounter (Signed)
Pt not available, spoke with wife, Jana Half; she is keeping CBGs off of pt's monitor QHs-aware of 10.15.13 appointment & will have fasting labs done prior/SLS

## 2012-03-15 LAB — BASIC METABOLIC PANEL
CO2: 22 mEq/L (ref 19–32)
Glucose, Bld: 205 mg/dL — ABNORMAL HIGH (ref 70–99)
Potassium: 4.1 mEq/L (ref 3.5–5.3)
Sodium: 142 mEq/L (ref 135–145)

## 2012-03-15 LAB — CBC WITH DIFFERENTIAL/PLATELET
Eosinophils Absolute: 0.5 10*3/uL (ref 0.0–0.7)
HCT: 33.2 % — ABNORMAL LOW (ref 39.0–52.0)
Hemoglobin: 11.3 g/dL — ABNORMAL LOW (ref 13.0–17.0)
Lymphs Abs: 1.7 10*3/uL (ref 0.7–4.0)
MCH: 28.3 pg (ref 26.0–34.0)
Monocytes Relative: 10 % (ref 3–12)
Neutro Abs: 3.6 10*3/uL (ref 1.7–7.7)
Neutrophils Relative %: 54 % (ref 43–77)
RBC: 4 MIL/uL — ABNORMAL LOW (ref 4.22–5.81)

## 2012-03-15 LAB — IRON: Iron: 62 ug/dL (ref 42–165)

## 2012-03-15 NOTE — Telephone Encounter (Signed)
Pt presented to the lab, future orders released. 

## 2012-03-15 NOTE — Addendum Note (Signed)
Addended by: Kelle Darting A on: 03/15/2012 09:18 AM   Modules accepted: Orders

## 2012-03-19 ENCOUNTER — Encounter: Payer: Self-pay | Admitting: Internal Medicine

## 2012-03-19 ENCOUNTER — Ambulatory Visit (INDEPENDENT_AMBULATORY_CARE_PROVIDER_SITE_OTHER): Payer: Medicare PPO | Admitting: Internal Medicine

## 2012-03-19 VITALS — BP 130/80 | HR 65 | Temp 97.6°F | Resp 16 | Wt 182.0 lb

## 2012-03-19 DIAGNOSIS — Z1211 Encounter for screening for malignant neoplasm of colon: Secondary | ICD-10-CM

## 2012-03-19 DIAGNOSIS — E1149 Type 2 diabetes mellitus with other diabetic neurological complication: Secondary | ICD-10-CM

## 2012-03-19 DIAGNOSIS — E1142 Type 2 diabetes mellitus with diabetic polyneuropathy: Secondary | ICD-10-CM

## 2012-03-19 DIAGNOSIS — E1165 Type 2 diabetes mellitus with hyperglycemia: Secondary | ICD-10-CM

## 2012-03-19 DIAGNOSIS — D649 Anemia, unspecified: Secondary | ICD-10-CM

## 2012-03-19 MED ORDER — METFORMIN HCL 500 MG PO TABS: 500.0000 mg | ORAL_TABLET | Freq: Two times a day (BID) | ORAL | Status: DC

## 2012-03-19 NOTE — Patient Instructions (Signed)
Please schedule fasting labs prior to next visit Cbc, spep/upep-anemia, chem7, a1c-250.00 and lipid/lft-272.4

## 2012-03-23 NOTE — Assessment & Plan Note (Signed)
Recommend dietary modification to minimize blood sugar variability. Begin metformin 500 mg twice a day. Call clinic with blood sugar report in approximately 2 weeks.

## 2012-03-23 NOTE — Assessment & Plan Note (Signed)
Discussed the need for colorectal cancer screening. Patient understands recommendations and agrees. GI referral.
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
## 2012-05-16 ENCOUNTER — Encounter: Payer: Medicare PPO | Admitting: Internal Medicine

## 2012-06-18 ENCOUNTER — Ambulatory Visit: Payer: Medicare PPO | Admitting: Internal Medicine

## 2012-06-19 ENCOUNTER — Ambulatory Visit: Payer: Medicare PPO | Admitting: Family

## 2012-06-19 DIAGNOSIS — Z0289 Encounter for other administrative examinations: Secondary | ICD-10-CM

## 2012-07-24 ENCOUNTER — Encounter: Payer: Self-pay | Admitting: Family

## 2012-07-24 ENCOUNTER — Ambulatory Visit (INDEPENDENT_AMBULATORY_CARE_PROVIDER_SITE_OTHER): Payer: Medicare PPO | Admitting: Family

## 2012-07-24 VITALS — BP 120/88 | HR 66 | Temp 98.2°F | Resp 16 | Ht 69.0 in | Wt 183.1 lb

## 2012-07-24 DIAGNOSIS — E1165 Type 2 diabetes mellitus with hyperglycemia: Secondary | ICD-10-CM

## 2012-07-24 DIAGNOSIS — I10 Essential (primary) hypertension: Secondary | ICD-10-CM

## 2012-07-24 DIAGNOSIS — D649 Anemia, unspecified: Secondary | ICD-10-CM

## 2012-07-24 DIAGNOSIS — E785 Hyperlipidemia, unspecified: Secondary | ICD-10-CM

## 2012-07-24 DIAGNOSIS — E1149 Type 2 diabetes mellitus with other diabetic neurological complication: Secondary | ICD-10-CM

## 2012-07-24 DIAGNOSIS — E1142 Type 2 diabetes mellitus with diabetic polyneuropathy: Secondary | ICD-10-CM

## 2012-07-24 DIAGNOSIS — Z23 Encounter for immunization: Secondary | ICD-10-CM

## 2012-07-24 LAB — BASIC METABOLIC PANEL
BUN: 32 mg/dL — ABNORMAL HIGH (ref 6–23)
Creat: 1.58 mg/dL — ABNORMAL HIGH (ref 0.50–1.35)

## 2012-07-24 LAB — HEMOGLOBIN A1C: Hgb A1c MFr Bld: 8.8 % — ABNORMAL HIGH (ref <5.7)

## 2012-07-24 MED ORDER — AMLODIPINE BESYLATE 10 MG PO TABS: 10.0000 mg | ORAL_TABLET | Freq: Every day | ORAL | Status: DC

## 2012-07-24 MED ORDER — LISINOPRIL-HYDROCHLOROTHIAZIDE 20-25 MG PO TABS: 2.0000 | ORAL_TABLET | ORAL | Status: DC

## 2012-07-24 MED ORDER — CARVEDILOL 25 MG PO TABS: 25.0000 mg | ORAL_TABLET | Freq: Two times a day (BID) | ORAL | Status: DC

## 2012-07-24 MED ORDER — METFORMIN HCL 500 MG PO TABS: 500.0000 mg | ORAL_TABLET | Freq: Two times a day (BID) | ORAL | Status: DC

## 2012-07-24 MED ORDER — NIACIN ER 500 MG PO CPCR: 500.0000 mg | ORAL_CAPSULE | Freq: Every day | ORAL | Status: DC

## 2012-07-24 MED ORDER — GABAPENTIN 100 MG PO CAPS: ORAL_CAPSULE | ORAL | Status: DC

## 2012-07-24 MED ORDER — ATORVASTATIN CALCIUM 80 MG PO TABS: 80.0000 mg | ORAL_TABLET | Freq: Every day | ORAL | Status: DC
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
## 2012-07-24 NOTE — Patient Instructions (Addendum)
Complete your lab work prior to leaving.  Please call [facility] to reschedule your colonoscopy.  Let us know if you have trouble rescheduling.  [phone]  Schedule a follow up appointment with your eye doctor for your eye exam. Follow up in 3 months.

## 2012-07-25 ENCOUNTER — Encounter: Payer: Self-pay | Admitting: Family

## 2012-07-25 ENCOUNTER — Telehealth: Payer: Self-pay | Admitting: Family

## 2012-07-25 DIAGNOSIS — N289 Disorder of kidney and ureter, unspecified: Secondary | ICD-10-CM | POA: Insufficient documentation

## 2012-07-25 LAB — CBC WITH DIFFERENTIAL/PLATELET
Lymphocytes Relative: 23 % (ref 12–46)
Lymphs Abs: 1.3 10*3/uL (ref 0.7–4.0)
Neutrophils Relative %: 62 % (ref 43–77)
Platelets: 245 10*3/uL (ref 150–400)
RBC: 4.19 MIL/uL — ABNORMAL LOW (ref 4.22–5.81)
WBC: 5.6 10*3/uL (ref 4.0–10.5)

## 2012-07-25 MED ORDER — SITAGLIPTIN PHOSPHATE 50 MG PO TABS: 50.0000 mg | ORAL_TABLET | Freq: Every day | ORAL | Status: DC

## 2012-07-25 NOTE — Telephone Encounter (Signed)
Diabetes uncontrolled.  I would like to set him up to see endocrinology for further management of his diabetes.  Kidney function has worsened slightly. This is likely related to his poorly controlled DM.  He should stop metformin and start Januvia. Rx sent.

## 2012-07-25 NOTE — Telephone Encounter (Signed)
Notified pt's wife of below instructions and she voices understanding.

## 2012-07-28 NOTE — Assessment & Plan Note (Signed)
BP Readings from Last 3 Encounters:  07/24/12 120/88  03/19/12 130/80  12/20/11 191/95   BP stable on lisinopril/hctz, coreg and amlodipine.  Continue same.

## 2012-07-28 NOTE — Assessment & Plan Note (Signed)
LDL at goal, continue statin. 

## 2012-07-28 NOTE — Assessment & Plan Note (Signed)
Uncontrolled. A1C is 8.8.  Stop metformin due to elevated creatinine (1.58). Start Januvia.  Refer to endocrinology for further assist with glucose control.

## 2012-07-28 NOTE — Assessment & Plan Note (Signed)
Reinforced importance of following through with GI referral.  I have asked pt to reschedule his consult.

## 2012-07-29 ENCOUNTER — Telehealth: Payer: Self-pay | Admitting: *Deleted

## 2012-07-29 MED ORDER — INSULIN NPH ISOPHANE & REGULAR (70-30) 100 UNIT/ML ~~LOC~~ SUSP: SUBCUTANEOUS | Status: DC

## 2012-07-29 NOTE — Telephone Encounter (Signed)
Received call from pt's wife stating the pharmacy never received the Januvia rx. Spoke with pharmacist at [facility] and he reports rx never came through. Rx given verbally per 07/25/12 authorization. Notified pt's wife.

## 2012-07-29 NOTE — Telephone Encounter (Signed)
Received message from pt's wife requesting refill on his novolog. Refill sent. Pt has appt with endocrinologist in March. Advised him they will need to send future refills once he establishes with them. Pt voices understanding.

## 2012-08-05 ENCOUNTER — Ambulatory Visit: Payer: Medicare PPO | Admitting: Internal Medicine

## 2012-08-22 ENCOUNTER — Encounter: Payer: Self-pay | Admitting: Internal Medicine

## 2012-08-22 ENCOUNTER — Ambulatory Visit (INDEPENDENT_AMBULATORY_CARE_PROVIDER_SITE_OTHER): Payer: Medicare PPO | Admitting: Internal Medicine

## 2012-08-22 VITALS — BP 130/70 | HR 73 | Temp 97.9°F | Resp 10 | Ht 68.0 in | Wt 184.0 lb

## 2012-08-22 DIAGNOSIS — E1149 Type 2 diabetes mellitus with other diabetic neurological complication: Secondary | ICD-10-CM

## 2012-08-22 DIAGNOSIS — E1142 Type 2 diabetes mellitus with diabetic polyneuropathy: Secondary | ICD-10-CM

## 2012-08-22 MED ORDER — INSULIN NPH ISOPHANE & REGULAR (70-30) 100 UNIT/ML ~~LOC~~ SUSP: SUBCUTANEOUS | Status: DC

## 2012-08-22 NOTE — Patient Instructions (Addendum)
Please return in a month with your sugar log. Please inject the pm insulin 30 min before dinner every day.  Patient instructions for Diabetes mellitus type 2:  DIET AND EXERCISE Diet and exercise is an important part of diabetic treatment.  We recommended aerobic exercise in the form of brisk walking (working between 40-60% of maximal aerobic capacity, similar to brisk walking) for 150 minutes per week (such as 30 minutes five days per week) along with 3 times per week performing 'resistance' training (using various gauge rubber tubes with handles) 5-10 exercises involving the major muscle groups (upper body, lower body and core) performing 10-15 repetitions (or near fatigue) each exercise. Start at half the above goal but build slowly to reach the above goals. If limited by weight, joint pain, or disability, we recommend daily walking in a swimming pool with water up to waist to reduce pressure from joints while allow for adequate exercise.    BLOOD GLUCOSES Monitoring your blood glucoses is important for continued management of your diabetes. Please check your blood glucoses 2-4 times a day: fasting, before meals and at bedtime (you can rotate these measurements - e.g. one day check before the 3 meals, the next day check before 2 of the meals and before bedtime, etc.   HYPOGLYCEMIA (low blood sugar) Hypoglycemia is usually a reaction to not eating, exercising, or taking too much insulin/ other diabetes drugs.  Symptoms include tremors, sweating, hunger, confusion, headache, etc. Treat IMMEDIATELY with 15 grams of Carbs:   4 glucose tablets    cup regular juice/soda   2 tablespoons raisins   4 teaspoons sugar   1 tablespoon honey Recheck blood glucose in 15 mins and repeat above if still symptomatic/blood glucose <100. Please contact our office at [phone] if you have questions about how to next handle your insulin.  RECOMMENDATIONS TO REDUCE YOUR RISK OF DIABETIC COMPLICATIONS: * Take  your prescribed MEDICATION(S). * Follow a DIABETIC diet: Complex carbs, fiber rich foods, heart healthy fish twice weekly, (monounsaturated and polyunsaturated) fats * AVOID saturated/trans fats, high fat foods, >2,300 mg salt per day. * EXERCISE at least 5 times a week for 30 minutes or preferably daily.  * DO NOT SMOKE OR DRINK more than 1 drink a day. * Check your FEET every day. Do not wear tightfitting shoes. Contact us if you develop an ulcer * See your EYE doctor once a year or more if needed * Get a FLU shot once a year * Get a PNEUMONIA vaccine every 5 years and once after age 69 years  GOALS:  * Your Hemoglobin A1c of 7%  * Your Systolic BP should be [id number] or lower  * Your Diastolic BP should be 80 or lower  * Your HDL (Good Cholesterol) should be 40 or higher  * Your LDL (Bad Cholesterol) should be 100 or lower  * Your Triglycerides should be 150 or lower  * Your Urine microalbumin (kidney function) of <30 * Your Body Mass Index should be 25 or lower  We will be glad to help you achieve these goals. Our telephone number is: [phone].
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
## 2012-09-27 ENCOUNTER — Ambulatory Visit (INDEPENDENT_AMBULATORY_CARE_PROVIDER_SITE_OTHER): Payer: Medicare PPO | Admitting: Internal Medicine

## 2012-09-27 ENCOUNTER — Encounter: Payer: Self-pay | Admitting: Internal Medicine

## 2012-09-27 VITALS — BP 146/80 | HR 81 | Wt 184.0 lb

## 2012-09-27 DIAGNOSIS — E1142 Type 2 diabetes mellitus with diabetic polyneuropathy: Secondary | ICD-10-CM

## 2012-09-27 DIAGNOSIS — E1149 Type 2 diabetes mellitus with other diabetic neurological complication: Secondary | ICD-10-CM

## 2012-09-27 NOTE — Patient Instructions (Signed)
Please return in 1 month. Do not forget your sugar log. Please check your sugars twice a day, in a.m., and before meals or at bedtime, rotating check times. Please increase your morning insulin to 35 units, and keep your evening insulin at 20 units. Please remember that these should be taken 30 minutes before a meal.
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
## 2012-10-23 ENCOUNTER — Ambulatory Visit: Payer: Medicare PPO | Admitting: Family

## 2012-10-23 ENCOUNTER — Ambulatory Visit: Payer: Medicare PPO | Admitting: Family Medicine

## 2012-10-25 ENCOUNTER — Ambulatory Visit: Payer: Medicare PPO | Admitting: Internal Medicine

## 2012-10-25 DIAGNOSIS — Z0289 Encounter for other administrative examinations: Secondary | ICD-10-CM

## 2012-11-08 ENCOUNTER — Ambulatory Visit (INDEPENDENT_AMBULATORY_CARE_PROVIDER_SITE_OTHER): Payer: Medicare PPO | Admitting: Family Medicine

## 2012-11-08 ENCOUNTER — Encounter: Payer: Self-pay | Admitting: Family Medicine

## 2012-11-08 VITALS — BP 180/78 | HR 76 | Temp 97.6°F | Ht 69.0 in | Wt 185.0 lb

## 2012-11-08 DIAGNOSIS — E1142 Type 2 diabetes mellitus with diabetic polyneuropathy: Secondary | ICD-10-CM

## 2012-11-08 DIAGNOSIS — E785 Hyperlipidemia, unspecified: Secondary | ICD-10-CM

## 2012-11-08 DIAGNOSIS — I10 Essential (primary) hypertension: Secondary | ICD-10-CM

## 2012-11-08 DIAGNOSIS — E1149 Type 2 diabetes mellitus with other diabetic neurological complication: Secondary | ICD-10-CM

## 2012-11-08 DIAGNOSIS — E119 Type 2 diabetes mellitus without complications: Secondary | ICD-10-CM

## 2012-11-08 DIAGNOSIS — J309 Allergic rhinitis, unspecified: Secondary | ICD-10-CM

## 2012-11-08 DIAGNOSIS — D649 Anemia, unspecified: Secondary | ICD-10-CM

## 2012-11-08 LAB — RENAL FUNCTION PANEL
Albumin: 4 g/dL (ref 3.5–5.2)
BUN: 28 mg/dL — ABNORMAL HIGH (ref 6–23)
Calcium: 9.1 mg/dL (ref 8.4–10.5)
Chloride: 107 mEq/L (ref 96–112)
Phosphorus: 3 mg/dL (ref 2.3–4.6)
Potassium: 4.2 mEq/L (ref 3.5–5.3)
Sodium: 136 mEq/L (ref 135–145)

## 2012-11-08 LAB — LIPID PANEL
HDL: 28 mg/dL — ABNORMAL LOW (ref >39)
LDL Cholesterol: 80 mg/dL (ref 0–99)
Triglycerides: 102 mg/dL (ref <150)
VLDL: 20 mg/dL (ref 0–40)

## 2012-11-08 LAB — HEPATIC FUNCTION PANEL
Albumin: 4 g/dL (ref 3.5–5.2)
Indirect Bilirubin: 0.6 mg/dL (ref 0.0–0.9)
Total Bilirubin: 0.8 mg/dL (ref 0.3–1.2)
Total Protein: 7.4 g/dL (ref 6.0–8.3)

## 2012-11-08 MED ORDER — FLUTICASONE PROPIONATE 50 MCG/ACT NA SUSP: 2.0000 | Freq: Every day | NASAL | Status: DC
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
## 2012-11-08 NOTE — Patient Instructions (Addendum)
Zyrtec 10 mg daily for next month and as needed Mucinex 600 mg po twice a day x 10 days Increase hydration Nasal saline flushes in nose when coming in from outside   Allergies, Generic Allergies may happen from anything your body is sensitive to. This may be food, medicines, pollens, chemicals, and nearly anything around you in everyday life that produces allergens. An allergen is anything that causes an allergy producing substance. Heredity is often a factor in causing these problems. This means you may have some of the same allergies as your parents. Food allergies happen in all age groups. Food allergies are some of the most severe and life threatening. Some common food allergies are cow's milk, seafood, eggs, nuts, wheat, and soybeans. SYMPTOMS   Swelling around the mouth.  An itchy red rash or hives.  Vomiting or diarrhea.  Difficulty breathing. SEVERE ALLERGIC REACTIONS ARE LIFE-THREATENING. This reaction is called anaphylaxis. It can cause the mouth and throat to swell and cause difficulty with breathing and swallowing. In severe reactions only a trace amount of food (for example, peanut oil in a salad) may cause death within seconds. Seasonal allergies occur in all age groups. These are seasonal because they usually occur during the same season every year. They may be a reaction to molds, grass pollens, or tree pollens. Other causes of problems are house dust mite allergens, pet dander, and mold spores. The symptoms often consist of nasal congestion, a runny itchy nose associated with sneezing, and tearing itchy eyes. There is often an associated itching of the mouth and ears. The problems happen when you come in contact with pollens and other allergens. Allergens are the particles in the air that the body reacts to with an allergic reaction. This causes you to release allergic antibodies. Through a chain of events, these eventually cause you to release histamine into the blood stream.  Although it is meant to be protective to the body, it is this release that causes your discomfort. This is why you were given anti-histamines to feel better. If you are unable to pinpoint the offending allergen, it may be determined by skin or blood testing. Allergies cannot be cured but can be controlled with medicine. Hay fever is a collection of all or some of the seasonal allergy problems. It may often be treated with simple over-the-counter medicine such as diphenhydramine. Take medicine as directed. Do not drink alcohol or drive while taking this medicine. Check with your caregiver or package insert for child dosages. If these medicines are not effective, there are many new medicines your caregiver can prescribe. Stronger medicine such as nasal spray, eye drops, and corticosteroids may be used if the first things you try do not work well. Other treatments such as immunotherapy or desensitizing injections can be used if all else fails. Follow up with your caregiver if problems continue. These seasonal allergies are usually not life threatening. They are generally more of a nuisance that can often be handled using medicine. HOME CARE INSTRUCTIONS   If unsure what causes a reaction, keep a diary of foods eaten and symptoms that follow. Avoid foods that cause reactions.  If hives or rash are present:  Take medicine as directed.  You may use an over-the-counter antihistamine (diphenhydramine) for hives and itching as needed.  Apply cold compresses (cloths) to the skin or take baths in cool water. Avoid hot baths or showers. Heat will make a rash and itching worse.  If you are severely allergic:  Following a  treatment for a severe reaction, hospitalization is often required for closer follow-up.  Wear a medic-alert bracelet or necklace stating the allergy.  You and your family must learn how to give adrenaline or use an anaphylaxis kit.  If you have had a severe reaction, always carry your  anaphylaxis kit or EpiPen with you. Use this medicine as directed by your caregiver if a severe reaction is occurring. Failure to do so could have a fatal outcome. SEEK MEDICAL CARE IF:  You suspect a food allergy. Symptoms generally happen within 30 minutes of eating a food.  Your symptoms have not gone away within 2 days or are getting worse.  You develop new symptoms.  You want to retest yourself or your child with a food or drink you think causes an allergic reaction. Never do this if an anaphylactic reaction to that food or drink has happened before. Only do this under the care of a caregiver. SEEK IMMEDIATE MEDICAL CARE IF:   You have difficulty breathing, are wheezing, or have a tight feeling in your chest or throat.  You have a swollen mouth, or you have hives, swelling, or itching all over your body.  You have had a severe reaction that has responded to your anaphylaxis kit or an EpiPen. These reactions may return when the medicine has worn off. These reactions should be considered life threatening. MAKE SURE YOU:   Understand these instructions.  Will watch your condition.  Will get help right away if you are not doing well or get worse. Document Released: 08/15/2002 Document Revised: 08/14/2011 Document Reviewed: 01/20/2008 [facility] Patient Information 2014 [place].

## 2012-11-09 LAB — CBC
MCH: 27.4 pg (ref 26.0–34.0)
MCHC: 33.7 g/dL (ref 30.0–36.0)
MCV: 81.3 fL (ref 78.0–100.0)
Platelets: 238 10*3/uL (ref 150–400)
RBC: 4.27 MIL/uL (ref 4.22–5.81)

## 2012-11-10 NOTE — Assessment & Plan Note (Signed)
Poorly controlled, but did not take his meds this morning, he will take his meds prior to next visit.

## 2012-11-10 NOTE — Assessment & Plan Note (Signed)
Minimize simple carbs, increase insulin doses by 2 units.

## 2012-11-10 NOTE — Assessment & Plan Note (Signed)
Tolerating Lipitor. Avoid trans fats, start krill oil caps daily

## 2012-11-10 NOTE — Assessment & Plan Note (Signed)
Stable and mild, will monitor

## 2012-11-13 NOTE — Addendum Note (Signed)
Addended by: Varney Daily on: 11/13/2012 03:52 PM   Modules accepted: Orders

## 2012-11-13 NOTE — Progress Notes (Signed)
Quick Note:  Patient Informed and voiced understanding.  I updated med list also ______

## 2013-02-12 ENCOUNTER — Other Ambulatory Visit: Payer: Self-pay | Admitting: *Deleted

## 2013-02-12 MED ORDER — INSULIN NPH ISOPHANE & REGULAR (70-30) 100 UNIT/ML ~~LOC~~ SUSP: SUBCUTANEOUS | Status: DC

## 2013-02-12 NOTE — Telephone Encounter (Signed)
Rx request to pharmacy/[facility]  

## 2013-02-14 ENCOUNTER — Encounter: Payer: Self-pay | Admitting: Family Medicine

## 2013-02-14 ENCOUNTER — Ambulatory Visit (INDEPENDENT_AMBULATORY_CARE_PROVIDER_SITE_OTHER): Payer: Medicare PPO | Admitting: Family Medicine

## 2013-02-14 VITALS — BP 120/80 | HR 77 | Temp 97.7°F | Ht 69.0 in | Wt 185.8 lb

## 2013-02-14 DIAGNOSIS — E1142 Type 2 diabetes mellitus with diabetic polyneuropathy: Secondary | ICD-10-CM

## 2013-02-14 DIAGNOSIS — E785 Hyperlipidemia, unspecified: Secondary | ICD-10-CM

## 2013-02-14 DIAGNOSIS — E1149 Type 2 diabetes mellitus with other diabetic neurological complication: Secondary | ICD-10-CM

## 2013-02-14 DIAGNOSIS — B354 Tinea corporis: Secondary | ICD-10-CM

## 2013-02-14 DIAGNOSIS — N289 Disorder of kidney and ureter, unspecified: Secondary | ICD-10-CM

## 2013-02-14 DIAGNOSIS — I10 Essential (primary) hypertension: Secondary | ICD-10-CM

## 2013-02-14 DIAGNOSIS — E119 Type 2 diabetes mellitus without complications: Secondary | ICD-10-CM

## 2013-02-14 HISTORY — DX: Tinea corporis: B35.4

## 2013-02-14 LAB — HEPATIC FUNCTION PANEL
Bilirubin, Direct: 0.1 mg/dL (ref 0.0–0.3)
Indirect Bilirubin: 0.6 mg/dL (ref 0.0–0.9)
Total Bilirubin: 0.7 mg/dL (ref 0.3–1.2)
Total Protein: 7.3 g/dL (ref 6.0–8.3)

## 2013-02-14 LAB — CBC
HCT: 33.5 % — ABNORMAL LOW (ref 39.0–52.0)
Hemoglobin: 11.6 g/dL — ABNORMAL LOW (ref 13.0–17.0)
MCHC: 34.6 g/dL (ref 30.0–36.0)

## 2013-02-14 LAB — LIPID PANEL
LDL Cholesterol: 84 mg/dL (ref 0–99)
VLDL: 31 mg/dL (ref 0–40)

## 2013-02-14 LAB — RENAL FUNCTION PANEL
BUN: 34 mg/dL — ABNORMAL HIGH (ref 6–23)
CO2: 25 mEq/L (ref 19–32)
Chloride: 105 mEq/L (ref 96–112)
Glucose, Bld: 234 mg/dL — ABNORMAL HIGH (ref 70–99)
Phosphorus: 2.9 mg/dL (ref 2.3–4.6)
Potassium: 4.1 mEq/L (ref 3.5–5.3)

## 2013-02-14 MED ORDER — INSULIN NPH ISOPHANE & REGULAR (70-30) 100 UNIT/ML ~~LOC~~ SUSP: SUBCUTANEOUS | Status: DC

## 2013-02-14 MED ORDER — CLOTRIMAZOLE-BETAMETHASONE 1-0.05 % EX CREA: TOPICAL_CREAM | Freq: Two times a day (BID) | CUTANEOUS | Status: DC

## 2013-02-14 NOTE — Assessment & Plan Note (Signed)
Continue current meds recheck lipids today

## 2013-02-14 NOTE — Assessment & Plan Note (Signed)
hgba1c today. Avoid simple carbs has been doing 32 and 20 units of his mix, will increase both by 2 units each dose.

## 2013-02-14 NOTE — Patient Instructions (Signed)
Body Ringworm °Ringworm (tinea corporis) is a fungal infection of the skin on the body. This infection is not caused by worms, but is actually caused by a fungus. Fungus normally lives on the top of your skin and can be useful. However, in the case of ringworms, the fungus grows out of control and causes a skin infection. It can involve any area of skin on the body and can spread easily from one person to another (contagious). Ringworm is a common problem for children, but it can affect adults as well. Ringworm is also often found in athletes, especially wrestlers who share equipment and mats.  °CAUSES  °Ringworm of the body is caused by a fungus called dermatophyte. It can spread by: °· Touching other people who are infected. °· Touching infected pets. °· Touching or sharing objects that have been in contact with the infected person or pet (hats, combs, towels, clothing, sports equipment). °SYMPTOMS  °· Itchy, raised red spots and bumps on the skin. °· Ring-shaped rash. °· Redness near the border of the rash with a clear center. °· Dry and scaly skin on or around the rash. °Not every person develops a ring-shaped rash. Some develop only the red, scaly patches. °DIAGNOSIS  °Most often, ringworm can be diagnosed by performing a skin exam. Your caregiver may choose to take a skin scraping from the affected area. The sample will be examined under the microscope to see if the fungus is present.  °TREATMENT  °Body ringworm may be treated with a topical antifungal cream or ointment. Sometimes, an antifungal shampoo that can be used on your body is prescribed. You may be prescribed antifungal medicines to take by mouth if your ringworm is severe, keeps coming back, or lasts a long time.  °HOME CARE INSTRUCTIONS  °· Only take over-the-counter or prescription medicines as directed by your caregiver. °· Wash the infected area and dry it completely before applying your cream or ointment. °· When using antifungal shampoo to  treat the ringworm, leave the shampoo on the body for 3 5 minutes before rinsing.    °· Wear loose clothing to stop clothes from rubbing and irritating the rash. °· Wash or change your bed sheets every night while you have the rash. °· Have your pet treated by your veterinarian if it has the same infection. °To prevent ringworm:  °· Practice good hygiene. °· Wear sandals or shoes in public places and showers. °· Do not share personal items with others. °· Avoid touching red patches of skin on other people. °· Avoid touching pets that have bald spots or wash your hands after doing so. °SEEK MEDICAL CARE IF:  °· Your rash continues to spread after 7 days of treatment. °· Your rash is not gone in 4 weeks. °· The area around your rash becomes red, warm, tender, and swollen. °Document Released: 05/19/2000 Document Revised: 02/14/2012 Document Reviewed: 12/04/2011 °[employer]® Patient Information ©2014 [employer]. ° °

## 2013-02-14 NOTE — Assessment & Plan Note (Signed)
Lotrisone bid to chest and back as needed
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
## 2013-02-14 NOTE — Assessment & Plan Note (Signed)
Well controlled, no changes 

## 2013-02-14 NOTE — Assessment & Plan Note (Signed)
Stable. Will continue to monitor.

## 2013-02-19 ENCOUNTER — Other Ambulatory Visit: Payer: Self-pay | Admitting: Family

## 2013-02-19 NOTE — Telephone Encounter (Signed)
Rx request to pharmacy/[facility]  

## 2013-02-21 ENCOUNTER — Telehealth: Payer: Self-pay | Admitting: Internal Medicine

## 2013-02-21 MED ORDER — LISINOPRIL-HYDROCHLOROTHIAZIDE 20-25 MG PO TABS: 2.0000 | ORAL_TABLET | ORAL | Status: DC

## 2013-02-21 MED ORDER — AMLODIPINE BESYLATE 10 MG PO TABS: 10.0000 mg | ORAL_TABLET | Freq: Every day | ORAL | Status: DC

## 2013-02-21 NOTE — Telephone Encounter (Signed)
Patients wife is requesting a refill of amlodipine and lisinopril to be sent to [facility] on [address]

## 2013-02-26 ENCOUNTER — Ambulatory Visit (INDEPENDENT_AMBULATORY_CARE_PROVIDER_SITE_OTHER): Payer: Medicare PPO | Admitting: Family Medicine

## 2013-02-26 ENCOUNTER — Encounter: Payer: Self-pay | Admitting: Family Medicine

## 2013-02-26 VITALS — BP 160/80 | HR 72 | Temp 98.2°F | Ht 69.0 in | Wt 185.0 lb

## 2013-02-26 DIAGNOSIS — E1142 Type 2 diabetes mellitus with diabetic polyneuropathy: Secondary | ICD-10-CM

## 2013-02-26 DIAGNOSIS — J309 Allergic rhinitis, unspecified: Secondary | ICD-10-CM

## 2013-02-26 DIAGNOSIS — E785 Hyperlipidemia, unspecified: Secondary | ICD-10-CM

## 2013-02-26 DIAGNOSIS — E1149 Type 2 diabetes mellitus with other diabetic neurological complication: Secondary | ICD-10-CM

## 2013-02-26 DIAGNOSIS — I10 Essential (primary) hypertension: Secondary | ICD-10-CM

## 2013-02-26 DIAGNOSIS — E119 Type 2 diabetes mellitus without complications: Secondary | ICD-10-CM

## 2013-02-26 MED ORDER — LISINOPRIL-HYDROCHLOROTHIAZIDE 20-12.5 MG PO TABS: 1.0000 | ORAL_TABLET | Freq: Every day | ORAL | Status: DC

## 2013-02-26 MED ORDER — INSULIN NPH ISOPHANE & REGULAR (70-30) 100 UNIT/ML ~~LOC~~ SUSP: SUBCUTANEOUS | Status: DC

## 2013-02-26 MED ORDER — LISINOPRIL-HYDROCHLOROTHIAZIDE 20-12.5 MG PO TABS: 1.0000 | ORAL_TABLET | Freq: Two times a day (BID) | ORAL | Status: DC

## 2013-02-26 NOTE — Assessment & Plan Note (Signed)
Encouraged heart healthy diet, increase exercise, avoid trans fats, consider a krill oil cap daily 

## 2013-02-26 NOTE — Patient Instructions (Addendum)
Start a probiotic daily such as Digestive Advantage 1 cap daily or a generic Start a fish oil cap or a krill oil cap (MegaRed) daily  Basic Carbohydrate Counting Basic carbohydrate counting is a way to plan meals. It is done by counting the amount of carbohydrate in foods. Foods that have carbohydrates are starches (grains, beans, starchy vegetables) and sweets. Eating carbohydrates increases blood glucose (sugar) levels. People with diabetes use carbohydrate counting to help keep their blood glucose at a normal level.  COUNTING CARBOHYDRATES IN FOODS The first step in counting carbohydrates is to learn how many carbohydrate servings you should have in every meal. A dietitian can plan this for you. After learning the amount of carbohydrates to include in your meal plan, you can start to choose the carbohydrate-containing foods you want to eat.  There are 2 ways to identify the amount of carbohydrates in the foods you eat.  Read the Nutrition Facts panel on food labels. You need 2 pieces of information from the Nutrition Facts panel to count carbohydrates this way:  Serving size.  Total carbohydrate (in grams). Decide how many servings you will be eating. If it is 1 serving, you will be eating the amount of carbohydrate listed on the panel. If you will be eating 2 servings, you will be eating double the amount of carbohydrate listed on the panel.   Learn serving sizes. A serving size of most carbohydrate-containing foods is about 15 grams (g). Listed below are single serving sizes of common carbohydrate-containing foods:  1 slice bread.   cup unsweetened, dry cereal.   cup hot cereal.   cup rice.   cup mashed potatoes.   cup pasta.  1 cup fresh fruit.   cup canned fruit.  1 cup milk (whole, 2%, or skim).   cup starchy vegetables (peas, corn, or potatoes). Counting carbohydrates this way is similar to looking on the Nutrition Facts panel. Decide how many servings you will eat  first. Multiply the number of servings you eat by 15 g. For example, if you have 2 cups of strawberries, you had 2 servings. That means you had 30 g of carbohydrate (2 servings x 15 g = 30 g). CALCULATING CARBOHYDRATES IN A MEAL Sample dinner  3 oz chicken breast.   cup brown rice.   cup corn.  1 cup fat-free milk.  1 cup strawberries with sugar-free whipped topping. Carbohydrate calculation First, identify the foods that contain carbohydrate:  Rice.  Corn.  Milk.  Strawberries. Calculate the number of servings eaten:  2 servings rice.  1 serving corn.  1 serving milk.  1 serving strawberries. Multiply the number of servings by 15 g:  2 servings rice x 15 g = 30 g.  1 serving corn x 15 g = 15 g.  1 serving milk x 15 g = 15 g.  1 serving strawberries x 15 g = 15 g. Add the amounts to find the total carbohydrates eaten: 30 g + 15 g + 15 g + 15 g = 75 g carbohydrate eaten at dinner. Document Released: 05/22/2005 Document Revised: 08/14/2011 Document Reviewed: 04/07/2011 [facility] Patient Information 2014 [place], [place].

## 2013-03-02 ENCOUNTER — Encounter: Payer: Self-pay | Admitting: Family Medicine

## 2013-03-02 NOTE — Assessment & Plan Note (Signed)
Will adjust lisinopril hct to 20/12.5 bid, Norvasc at 10 mg and Coreg at 25 bid, encouraged DASH diet and monitor

## 2013-03-02 NOTE — Assessment & Plan Note (Signed)
Encouraged daily antihistamine and Flonase, add a probiotics and report worsening symptoms
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
## 2013-03-02 NOTE — Assessment & Plan Note (Signed)
Increase Novolin 70/30 by 2 units each dose, remember to use complex carbs and one per meal. Protein needed with each meal. Monitor. Declines flu shot today

## 2013-05-19 ENCOUNTER — Ambulatory Visit: Payer: Medicare PPO | Admitting: Family Medicine

## 2013-05-22 ENCOUNTER — Encounter: Payer: Self-pay | Admitting: Family Medicine

## 2013-05-22 ENCOUNTER — Telehealth: Payer: Self-pay | Admitting: Family Medicine

## 2013-05-22 ENCOUNTER — Ambulatory Visit (INDEPENDENT_AMBULATORY_CARE_PROVIDER_SITE_OTHER): Payer: Medicare PPO | Admitting: Family Medicine

## 2013-05-22 VITALS — BP 144/80 | HR 84 | Temp 98.4°F | Ht 69.0 in | Wt 186.1 lb

## 2013-05-22 DIAGNOSIS — E785 Hyperlipidemia, unspecified: Secondary | ICD-10-CM

## 2013-05-22 DIAGNOSIS — IMO0002 Reserved for concepts with insufficient information to code with codable children: Secondary | ICD-10-CM

## 2013-05-22 DIAGNOSIS — E119 Type 2 diabetes mellitus without complications: Secondary | ICD-10-CM

## 2013-05-22 DIAGNOSIS — I10 Essential (primary) hypertension: Secondary | ICD-10-CM

## 2013-05-22 DIAGNOSIS — E1149 Type 2 diabetes mellitus with other diabetic neurological complication: Secondary | ICD-10-CM

## 2013-05-22 DIAGNOSIS — E1142 Type 2 diabetes mellitus with diabetic polyneuropathy: Secondary | ICD-10-CM

## 2013-05-22 DIAGNOSIS — N289 Disorder of kidney and ureter, unspecified: Secondary | ICD-10-CM

## 2013-05-22 DIAGNOSIS — J209 Acute bronchitis, unspecified: Secondary | ICD-10-CM

## 2013-05-22 LAB — HEPATIC FUNCTION PANEL
ALT: 13 U/L (ref 0–53)
Albumin: 3.5 g/dL (ref 3.5–5.2)
Alkaline Phosphatase: 55 U/L (ref 39–117)
Indirect Bilirubin: 0.4 mg/dL (ref 0.0–0.9)
Total Protein: 7 g/dL (ref 6.0–8.3)

## 2013-05-22 LAB — RENAL FUNCTION PANEL
BUN: 29 mg/dL — ABNORMAL HIGH (ref 6–23)
Calcium: 8.8 mg/dL (ref 8.4–10.5)
Creat: 1.36 mg/dL — ABNORMAL HIGH (ref 0.50–1.35)
Glucose, Bld: 249 mg/dL — ABNORMAL HIGH (ref 70–99)
Phosphorus: 3.1 mg/dL (ref 2.3–4.6)
Potassium: 4.2 mEq/L (ref 3.5–5.3)

## 2013-05-22 LAB — CBC
MCH: 27.3 pg (ref 26.0–34.0)
MCV: 81.1 fL (ref 78.0–100.0)
Platelets: 265 10*3/uL (ref 150–400)
RDW: 14.2 % (ref 11.5–15.5)
WBC: 6.3 10*3/uL (ref 4.0–10.5)

## 2013-05-22 LAB — LIPID PANEL
Cholesterol: 135 mg/dL (ref 0–200)
Total CHOL/HDL Ratio: 4.5 Ratio
Triglycerides: 90 mg/dL (ref <150)
VLDL: 18 mg/dL (ref 0–40)

## 2013-05-22 LAB — HEMOGLOBIN A1C: Mean Plasma Glucose: 203 mg/dL — ABNORMAL HIGH (ref <117)

## 2013-05-22 MED ORDER — AMOXICILLIN-POT CLAVULANATE 875-125 MG PO TABS: 1.0000 | ORAL_TABLET | Freq: Two times a day (BID) | ORAL | Status: DC

## 2013-05-22 NOTE — Progress Notes (Signed)
Pre visit review using our clinic review tool, if applicable. No additional management support is needed unless otherwise documented below in the visit note. 

## 2013-05-22 NOTE — Telephone Encounter (Signed)
LAB ORDER WEEK OF 08-11-2013 Labs prior to next visit. Lipid, renal, cbc, hepatic, tsh, hgba1c

## 2013-05-22 NOTE — Patient Instructions (Signed)
Mucinex 600 mg twice daily x 10 days Probiotics daily, Align etc, generic is fine Zinc 50 mg daily when feeling ill  Basic Carbohydrate Counting Basic carbohydrate counting is a way to plan meals. It is done by counting the amount of carbohydrate in foods. Foods that have carbohydrates are starches (grains, beans, starchy vegetables) and sweets. Eating carbohydrates increases blood glucose (sugar) levels. People with diabetes use carbohydrate counting to help keep their blood glucose at a normal level.  COUNTING CARBOHYDRATES IN FOODS The first step in counting carbohydrates is to learn how many carbohydrate servings you should have in every meal. A dietitian can plan this for you. After learning the amount of carbohydrates to include in your meal plan, you can start to choose the carbohydrate-containing foods you want to eat.  There are 2 ways to identify the amount of carbohydrates in the foods you eat.  Read the Nutrition Facts panel on food labels. You need 2 pieces of information from the Nutrition Facts panel to count carbohydrates this way:  Serving size.  Total carbohydrate (in grams). Decide how many servings you will be eating. If it is 1 serving, you will be eating the amount of carbohydrate listed on the panel. If you will be eating 2 servings, you will be eating double the amount of carbohydrate listed on the panel.   Learn serving sizes. A serving size of most carbohydrate-containing foods is about 15 grams (g). Listed below are single serving sizes of common carbohydrate-containing foods:  1 slice bread.   cup unsweetened, dry cereal.   cup hot cereal.   cup rice.   cup mashed potatoes.   cup pasta.  1 cup fresh fruit.   cup canned fruit.  1 cup milk (whole, 2%, or skim).   cup starchy vegetables (peas, corn, or potatoes). Counting carbohydrates this way is similar to looking on the Nutrition Facts panel. Decide how many servings you will eat first. Multiply  the number of servings you eat by 15 g. For example, if you have 2 cups of strawberries, you had 2 servings. That means you had 30 g of carbohydrate (2 servings x 15 g = 30 g). CALCULATING CARBOHYDRATES IN A MEAL Sample dinner  3 oz chicken breast.   cup brown rice.   cup corn.  1 cup fat-free milk.  1 cup strawberries with sugar-free whipped topping. Carbohydrate calculation First, identify the foods that contain carbohydrate:  Rice.  Corn.  Milk.  Strawberries. Calculate the number of servings eaten:  2 servings rice.  1 serving corn.  1 serving milk.  1 serving strawberries. Multiply the number of servings by 15 g:  2 servings rice x 15 g = 30 g.  1 serving corn x 15 g = 15 g.  1 serving milk x 15 g = 15 g.  1 serving strawberries x 15 g = 15 g. Add the amounts to find the total carbohydrates eaten: 30 g + 15 g + 15 g + 15 g = 75 g carbohydrate eaten at dinner. Document Released: 05/22/2005 Document Revised: 08/14/2011 Document Reviewed: 04/07/2011 [facility] Patient Information 2014 [place], [place].

## 2013-05-23 ENCOUNTER — Other Ambulatory Visit: Payer: Self-pay | Admitting: Family Medicine

## 2013-05-23 NOTE — Telephone Encounter (Signed)
Lab order placed.

## 2013-05-26 ENCOUNTER — Encounter: Payer: Self-pay | Admitting: Family Medicine

## 2013-05-26 DIAGNOSIS — J209 Acute bronchitis, unspecified: Secondary | ICD-10-CM | POA: Insufficient documentation

## 2013-05-26 NOTE — Assessment & Plan Note (Signed)
Good response to Atorvastatin, avoid trans fats

## 2013-05-26 NOTE — Assessment & Plan Note (Signed)
Started on Augmetin, mucinex and probiotics

## 2013-05-26 NOTE — Assessment & Plan Note (Signed)
hgba1c improved since last visit needs more control, avoid simple carbs and increase insulin doses by 2 units.

## 2013-05-26 NOTE — Assessment & Plan Note (Signed)
Improved on recheck, no changes today

## 2013-05-26 NOTE — Assessment & Plan Note (Signed)
Somewhat improved since last visit, no changes
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
## 2013-05-27 ENCOUNTER — Encounter (INDEPENDENT_AMBULATORY_CARE_PROVIDER_SITE_OTHER): Payer: Medicare PPO | Admitting: Ophthalmology

## 2013-05-27 DIAGNOSIS — H35039 Hypertensive retinopathy, unspecified eye: Secondary | ICD-10-CM

## 2013-05-27 DIAGNOSIS — E11359 Type 2 diabetes mellitus with proliferative diabetic retinopathy without macular edema: Secondary | ICD-10-CM

## 2013-05-27 DIAGNOSIS — H43819 Vitreous degeneration, unspecified eye: Secondary | ICD-10-CM

## 2013-05-27 DIAGNOSIS — H2510 Age-related nuclear cataract, unspecified eye: Secondary | ICD-10-CM

## 2013-05-27 DIAGNOSIS — E1139 Type 2 diabetes mellitus with other diabetic ophthalmic complication: Secondary | ICD-10-CM

## 2013-05-27 DIAGNOSIS — I10 Essential (primary) hypertension: Secondary | ICD-10-CM

## 2013-06-24 ENCOUNTER — Other Ambulatory Visit: Payer: Self-pay | Admitting: Family Medicine

## 2013-07-04 ENCOUNTER — Telehealth: Payer: Self-pay | Admitting: Family Medicine

## 2013-07-04 NOTE — Telephone Encounter (Signed)
Needs [employer] referral to Dr. Monna Fam NPI [id number], Dx [phone]

## 2013-07-06 ENCOUNTER — Other Ambulatory Visit: Payer: Self-pay | Admitting: Family Medicine

## 2013-07-06 DIAGNOSIS — E119 Type 2 diabetes mellitus without complications: Secondary | ICD-10-CM

## 2013-07-06 NOTE — Telephone Encounter (Signed)
So I placed the referral but this is a good example of why I need certain info. I reviewed her chart and could notfind any notes from Dr Ryken Deaner let alone what he sees Dr Azarion Deaner for. So may of these patient have been seeing these specialists since well before I got to this office this year. I happen to know Dr Avion Deaner is an opthamologist but I often do not know what specialty. No reference in chart as to what he sees her so I placed referral for diabetes. Long story short, need to know what the specialist does and what the patient sees them for. If the patient calls ask them, if the office calls ask them.   Thanks [employer]

## 2013-07-07 NOTE — Telephone Encounter (Signed)
Can you please call and ask pt what he sees Roberto Deaner for?

## 2013-07-07 NOTE — Telephone Encounter (Signed)
Left message for patient to return my call.

## 2013-07-25 ENCOUNTER — Other Ambulatory Visit: Payer: Self-pay | Admitting: Family Medicine

## 2013-07-25 NOTE — Telephone Encounter (Signed)
Rx request to pharmacy/[facility]  

## 2013-08-15 LAB — RENAL FUNCTION PANEL
Albumin: 3.7 g/dL (ref 3.5–5.2)
BUN: 30 mg/dL — ABNORMAL HIGH (ref 6–23)
CO2: 24 mEq/L (ref 19–32)
Calcium: 9 mg/dL (ref 8.4–10.5)
Chloride: 106 mEq/L (ref 96–112)
Creat: 1.38 mg/dL — ABNORMAL HIGH (ref 0.50–1.35)
Glucose, Bld: 230 mg/dL — ABNORMAL HIGH (ref 70–99)
Phosphorus: 2.9 mg/dL (ref 2.3–4.6)
Potassium: 3.9 mEq/L (ref 3.5–5.3)
Sodium: 140 mEq/L (ref 135–145)

## 2013-08-15 LAB — LIPID PANEL
Cholesterol: 142 mg/dL (ref 0–200)
HDL: 31 mg/dL — ABNORMAL LOW (ref >39)
LDL Cholesterol: 87 mg/dL (ref 0–99)
Total CHOL/HDL Ratio: 4.6 Ratio
Triglycerides: 120 mg/dL (ref <150)
VLDL: 24 mg/dL (ref 0–40)

## 2013-08-15 LAB — HEMOGLOBIN A1C
Hgb A1c MFr Bld: 9 % — ABNORMAL HIGH (ref <5.7)
Mean Plasma Glucose: 212 mg/dL — ABNORMAL HIGH (ref <117)

## 2013-08-15 LAB — CBC
HCT: 35.2 % — ABNORMAL LOW (ref 39.0–52.0)
Hemoglobin: 12.1 g/dL — ABNORMAL LOW (ref 13.0–17.0)
MCH: 28.5 pg (ref 26.0–34.0)
MCHC: 34.4 g/dL (ref 30.0–36.0)
MCV: 82.8 fL (ref 78.0–100.0)
Platelets: 262 10*3/uL (ref 150–400)
RBC: 4.25 MIL/uL (ref 4.22–5.81)
RDW: 14.4 % (ref 11.5–15.5)
WBC: 6.6 10*3/uL (ref 4.0–10.5)

## 2013-08-15 LAB — HEPATIC FUNCTION PANEL
ALT: 17 U/L (ref 0–53)
AST: 19 U/L (ref 0–37)
Albumin: 3.7 g/dL (ref 3.5–5.2)
Alkaline Phosphatase: 54 U/L (ref 39–117)
Bilirubin, Direct: 0.1 mg/dL (ref 0.0–0.3)
Indirect Bilirubin: 0.3 mg/dL (ref 0.2–1.2)
Total Bilirubin: 0.4 mg/dL (ref 0.2–1.2)
Total Protein: 6.9 g/dL (ref 6.0–8.3)

## 2013-08-15 LAB — TSH: TSH: 1.516 u[IU]/mL (ref 0.350–4.500)

## 2013-08-18 ENCOUNTER — Ambulatory Visit (INDEPENDENT_AMBULATORY_CARE_PROVIDER_SITE_OTHER): Payer: Medicare PPO | Admitting: Family Medicine

## 2013-08-18 ENCOUNTER — Encounter: Payer: Self-pay | Admitting: Family Medicine

## 2013-08-18 VITALS — BP 188/84 | HR 78 | Temp 98.5°F | Ht 69.0 in | Wt 182.1 lb

## 2013-08-18 DIAGNOSIS — Z0000 Encounter for general adult medical examination without abnormal findings: Secondary | ICD-10-CM

## 2013-08-18 DIAGNOSIS — E119 Type 2 diabetes mellitus without complications: Secondary | ICD-10-CM

## 2013-08-18 DIAGNOSIS — IMO0002 Reserved for concepts with insufficient information to code with codable children: Secondary | ICD-10-CM

## 2013-08-18 DIAGNOSIS — I10 Essential (primary) hypertension: Secondary | ICD-10-CM

## 2013-08-18 DIAGNOSIS — E785 Hyperlipidemia, unspecified: Secondary | ICD-10-CM

## 2013-08-18 DIAGNOSIS — E1165 Type 2 diabetes mellitus with hyperglycemia: Secondary | ICD-10-CM

## 2013-08-18 DIAGNOSIS — E1149 Type 2 diabetes mellitus with other diabetic neurological complication: Secondary | ICD-10-CM

## 2013-08-18 MED ORDER — INSULIN NPH ISOPHANE & REGULAR (70-30) 100 UNIT/ML ~~LOC~~ SUSP: SUBCUTANEOUS | Status: DC

## 2013-08-18 NOTE — Assessment & Plan Note (Signed)
Poor control but declines referral to nutrition and/or endocrine

## 2013-08-18 NOTE — Progress Notes (Signed)
Pre visit review using our clinic review tool, if applicable. No additional management support is needed unless otherwise documented below in the visit note. 

## 2013-08-18 NOTE — Patient Instructions (Signed)
DASH Diet  The DASH diet stands for "Dietary Approaches to Stop Hypertension." It is a healthy eating plan that has been shown to reduce high blood pressure (hypertension) in as little as 14 days, while also possibly providing other significant health benefits. These other health benefits include reducing the risk of breast cancer after menopause and reducing the risk of type 2 diabetes, heart disease, colon cancer, and stroke. Health benefits also include weight loss and slowing kidney failure in patients with chronic kidney disease.   DIET GUIDELINES  · Limit salt (sodium). Your diet should contain less than 1500 mg of sodium daily.  · Limit refined or processed carbohydrates. Your diet should include mostly whole grains. Desserts and added sugars should be used sparingly.  · Include small amounts of heart-healthy fats. These types of fats include nuts, oils, and tub margarine. Limit saturated and trans fats. These fats have been shown to be harmful in the body.  CHOOSING FOODS   The following food groups are based on a 2000 calorie diet. See your Registered Dietitian for individual calorie needs.  Grains and Grain Products (6 to 8 servings daily)  · Eat More Often: Whole-wheat bread, brown rice, whole-grain or wheat pasta, quinoa, popcorn without added fat or salt (air popped).  · Eat Less Often: White bread, white pasta, white rice, cornbread.  Vegetables (4 to 5 servings daily)  · Eat More Often: Fresh, frozen, and canned vegetables. Vegetables may be raw, steamed, roasted, or grilled with a minimal amount of fat.  · Eat Less Often/Avoid: Creamed or fried vegetables. Vegetables in a cheese sauce.  Fruit (4 to 5 servings daily)  · Eat More Often: All fresh, canned (in natural juice), or frozen fruits. Dried fruits without added sugar. One hundred percent fruit juice (½ cup [237 mL] daily).  · Eat Less Often: Dried fruits with added sugar. Canned fruit in light or heavy syrup.  Lean Meats, Fish, and Poultry (2  servings or less daily. One serving is 3 to 4 oz [85-114 g]).  · Eat More Often: Ninety percent or leaner ground beef, tenderloin, sirloin. Round cuts of beef, chicken breast, turkey breast. All fish. Grill, bake, or broil your meat. Nothing should be fried.  · Eat Less Often/Avoid: Fatty cuts of meat, turkey, or chicken leg, thigh, or wing. Fried cuts of meat or fish.  Dairy (2 to 3 servings)  · Eat More Often: Low-fat or fat-free milk, low-fat plain or light yogurt, reduced-fat or part-skim cheese.  · Eat Less Often/Avoid: Milk (whole, 2%). Whole milk yogurt. Full-fat cheeses.  Nuts, Seeds, and Legumes (4 to 5 servings per week)  · Eat More Often: All without added salt.  · Eat Less Often/Avoid: Salted nuts and seeds, canned beans with added salt.  Fats and Sweets (limited)  · Eat More Often: Vegetable oils, tub margarines without trans fats, sugar-free gelatin. Mayonnaise and salad dressings.  · Eat Less Often/Avoid: Coconut oils, palm oils, butter, stick margarine, cream, half and half, cookies, candy, pie.  FOR MORE INFORMATION  The Dash Diet Eating Plan: www.dashdiet.org  Document Released: 05/11/2011 Document Revised: 08/14/2011 Document Reviewed: 05/11/2011  [employer]® Patient Information ©2014 [employer].

## 2013-08-18 NOTE — Assessment & Plan Note (Signed)
Poorly controlled but did not take meds today and usually takes all 3 in am, encouraged DASH diet, minimize caffeine and obtain adequate sleep. Report concerning symptoms and follow up as directed and as needed. Recheck with meds in 6 weeks

## 2013-08-18 NOTE — Assessment & Plan Note (Signed)
Tolerating statin, encouraged heart healthy diet, avoid trans fats, minimize simple carbs and saturated fats. Increase exercise as tolerated 

## 2013-08-19 ENCOUNTER — Telehealth: Payer: Self-pay | Admitting: Family Medicine

## 2013-08-19 NOTE — Telephone Encounter (Signed)
Relevant patient education assigned to patient using Emmi. ° °

## 2013-08-21 ENCOUNTER — Telehealth: Payer: Self-pay

## 2013-08-21 ENCOUNTER — Other Ambulatory Visit: Payer: Self-pay | Admitting: Family Medicine

## 2013-08-21 NOTE — Telephone Encounter (Signed)
Relevant patient education assigned to patient using Emmi. ° °

## 2013-08-24 ENCOUNTER — Encounter: Payer: Self-pay | Admitting: Family Medicine

## 2013-08-24 DIAGNOSIS — Z0000 Encounter for general adult medical examination without abnormal findings: Secondary | ICD-10-CM

## 2013-08-24 HISTORY — DX: Encounter for general adult medical examination without abnormal findings: Z00.00

## 2013-08-24 NOTE — Assessment & Plan Note (Signed)
Discussed need for colonoscopy again and patient continues to decline. Reminded of increased risk of colon cancer and colon cancer death if do not screen.
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
## 2013-08-29 ENCOUNTER — Telehealth: Payer: Self-pay | Admitting: Family Medicine

## 2013-08-29 NOTE — Telephone Encounter (Signed)
Left detailed message informing patient that jury duty forms are ready for pickup

## 2013-08-29 NOTE — Telephone Encounter (Signed)
Please inform pts spouse this is ready.

## 2013-08-29 NOTE — Telephone Encounter (Signed)
Patient wife called in stating that she had dropped off jury duty forms last week to have filled out. She is calling to see if forms have been completed.

## 2013-09-18 ENCOUNTER — Other Ambulatory Visit: Payer: Self-pay | Admitting: Family Medicine

## 2013-09-19 ENCOUNTER — Telehealth: Payer: Self-pay | Admitting: Family Medicine

## 2013-09-19 MED ORDER — ATORVASTATIN CALCIUM 80 MG PO TABS: 80.0000 mg | ORAL_TABLET | Freq: Every day | ORAL | Status: DC

## 2013-09-19 NOTE — Telephone Encounter (Signed)
Rx sent to pharmacy   

## 2013-09-19 NOTE — Telephone Encounter (Signed)
Refill lipitor

## 2013-09-23 ENCOUNTER — Ambulatory Visit: Payer: Medicare PPO | Admitting: Family Medicine

## 2013-10-07 ENCOUNTER — Ambulatory Visit: Payer: Medicare PPO | Admitting: Family Medicine

## 2013-10-07 DIAGNOSIS — Z0289 Encounter for other administrative examinations: Secondary | ICD-10-CM

## 2013-10-30 ENCOUNTER — Ambulatory Visit (INDEPENDENT_AMBULATORY_CARE_PROVIDER_SITE_OTHER): Payer: Commercial Managed Care - HMO | Admitting: Family Medicine

## 2013-10-30 ENCOUNTER — Encounter: Payer: Self-pay | Admitting: Family Medicine

## 2013-10-30 VITALS — BP 186/82 | HR 90 | Temp 98.1°F | Ht 69.0 in | Wt 185.1 lb

## 2013-10-30 DIAGNOSIS — E119 Type 2 diabetes mellitus without complications: Secondary | ICD-10-CM

## 2013-10-30 DIAGNOSIS — G609 Hereditary and idiopathic neuropathy, unspecified: Secondary | ICD-10-CM

## 2013-10-30 DIAGNOSIS — N289 Disorder of kidney and ureter, unspecified: Secondary | ICD-10-CM

## 2013-10-30 DIAGNOSIS — E785 Hyperlipidemia, unspecified: Secondary | ICD-10-CM

## 2013-10-30 DIAGNOSIS — E1149 Type 2 diabetes mellitus with other diabetic neurological complication: Secondary | ICD-10-CM

## 2013-10-30 DIAGNOSIS — E1165 Type 2 diabetes mellitus with hyperglycemia: Secondary | ICD-10-CM

## 2013-10-30 DIAGNOSIS — I10 Essential (primary) hypertension: Secondary | ICD-10-CM

## 2013-10-30 DIAGNOSIS — IMO0002 Reserved for concepts with insufficient information to code with codable children: Secondary | ICD-10-CM

## 2013-10-30 MED ORDER — CARVEDILOL 25 MG PO TABS: 25.0000 mg | ORAL_TABLET | Freq: Two times a day (BID) | ORAL | Status: DC

## 2013-10-30 MED ORDER — LISINOPRIL-HYDROCHLOROTHIAZIDE 20-12.5 MG PO TABS: 1.0000 | ORAL_TABLET | Freq: Two times a day (BID) | ORAL | Status: DC

## 2013-10-30 MED ORDER — AMLODIPINE BESYLATE 10 MG PO TABS: 10.0000 mg | ORAL_TABLET | Freq: Every day | ORAL | Status: DC

## 2013-10-30 MED ORDER — GABAPENTIN 100 MG PO CAPS: ORAL_CAPSULE | ORAL | Status: DC

## 2013-10-30 NOTE — Patient Instructions (Addendum)
  Needs BP check with nurse in 2 weeks   Hypertension As your heart beats, it forces blood through your arteries. This force is your blood pressure. If the pressure is too high, it is called hypertension (HTN) or high blood pressure. HTN is dangerous because you may have it and not know it. High blood pressure may mean that your heart has to work harder to pump blood. Your arteries may be narrow or stiff. The extra work puts you at risk for heart disease, stroke, and other problems.  Blood pressure consists of two numbers, a higher number over a lower, 110/72, for example. It is stated as "110 over 72." The ideal is below 120 for the top number (systolic) and under 80 for the bottom (diastolic). Write down your blood pressure today. You should pay close attention to your blood pressure if you have certain conditions such as:  Heart failure.  Prior heart attack.  Diabetes  Chronic kidney disease.  Prior stroke.  Multiple risk factors for heart disease. To see if you have HTN, your blood pressure should be measured while you are seated with your arm held at the level of the heart. It should be measured at least twice. A one-time elevated blood pressure reading (especially in the Emergency Department) does not mean that you need treatment. There may be conditions in which the blood pressure is different between your right and left arms. It is important to see your caregiver soon for a recheck. Most people have essential hypertension which means that there is not a specific cause. This type of high blood pressure may be lowered by changing lifestyle factors such as:  Stress.  Smoking.  Lack of exercise.  Excessive weight.  Drug/tobacco/alcohol use.  Eating less salt. Most people do not have symptoms from high blood pressure until it has caused damage to the body. Effective treatment can often prevent, delay or reduce that damage. TREATMENT  When a cause has been identified, treatment  for high blood pressure is directed at the cause. There are a large number of medications to treat HTN. These fall into several categories, and your caregiver will help you select the medicines that are best for you. Medications may have side effects. You should review side effects with your caregiver. If your blood pressure stays high after you have made lifestyle changes or started on medicines,   Your medication(s) may need to be changed.  Other problems may need to be addressed.  Be certain you understand your prescriptions, and know how and when to take your medicine.  Be sure to follow up with your caregiver within the time frame advised (usually within two weeks) to have your blood pressure rechecked and to review your medications.  If you are taking more than one medicine to lower your blood pressure, make sure you know how and at what times they should be taken. Taking two medicines at the same time can result in blood pressure that is too low. SEEK IMMEDIATE MEDICAL CARE IF:  You develop a severe headache, blurred or changing vision, or confusion.  You have unusual weakness or numbness, or a faint feeling.  You have severe chest or abdominal pain, vomiting, or breathing problems. MAKE SURE YOU:   Understand these instructions.  Will watch your condition.  Will get help right away if you are not doing well or get worse. Document Released: 05/22/2005 Document Revised: 08/14/2011 Document Reviewed: 01/10/2008 [facility] Patient Information 2014 [place].

## 2013-10-30 NOTE — Progress Notes (Signed)
Pre visit review using our clinic review tool, if applicable. No additional management support is needed unless otherwise documented below in the visit note. 

## 2013-11-02 ENCOUNTER — Encounter: Payer: Self-pay | Admitting: Family Medicine

## 2013-11-02 MED ORDER — GABAPENTIN 100 MG PO CAPS: ORAL_CAPSULE | ORAL | Status: DC

## 2013-11-02 NOTE — Assessment & Plan Note (Signed)
stable °

## 2013-11-02 NOTE — Assessment & Plan Note (Signed)
Agrees to return for labs and minimize simple carbs, continue Insulin, eat small frequent meals with lean proteins and complex carbs.
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
## 2013-11-02 NOTE — Assessment & Plan Note (Signed)
Has been out of his bp meds for several days. Poorly controlled will restart  medications, encouraged DASH diet, minimize caffeine and obtain adequate sleep. Report concerning symptoms and follow up as directed and as needed

## 2013-11-02 NOTE — Assessment & Plan Note (Signed)
Tolerating statin, encouraged heart healthy diet, avoid trans fats, minimize simple carbs and saturated fats. Increase exercise as tolerated 

## 2013-11-03 DEATH — deceased

## 2014-02-02 ENCOUNTER — Ambulatory Visit: Payer: Commercial Managed Care - HMO | Admitting: Family Medicine

## 2014-02-26 ENCOUNTER — Ambulatory Visit (INDEPENDENT_AMBULATORY_CARE_PROVIDER_SITE_OTHER): Payer: Commercial Managed Care - HMO | Admitting: Family Medicine

## 2014-02-26 ENCOUNTER — Encounter: Payer: Self-pay | Admitting: Family Medicine

## 2014-02-26 VITALS — BP 126/84 | HR 76 | Temp 98.0°F | Ht 69.0 in | Wt 188.6 lb

## 2014-02-26 DIAGNOSIS — E1165 Type 2 diabetes mellitus with hyperglycemia: Secondary | ICD-10-CM

## 2014-02-26 DIAGNOSIS — E1149 Type 2 diabetes mellitus with other diabetic neurological complication: Secondary | ICD-10-CM

## 2014-02-26 DIAGNOSIS — D649 Anemia, unspecified: Secondary | ICD-10-CM

## 2014-02-26 DIAGNOSIS — IMO0002 Reserved for concepts with insufficient information to code with codable children: Secondary | ICD-10-CM

## 2014-02-26 DIAGNOSIS — I10 Essential (primary) hypertension: Secondary | ICD-10-CM

## 2014-02-26 DIAGNOSIS — E785 Hyperlipidemia, unspecified: Secondary | ICD-10-CM

## 2014-02-26 DIAGNOSIS — N289 Disorder of kidney and ureter, unspecified: Secondary | ICD-10-CM

## 2014-02-26 NOTE — Patient Instructions (Signed)

## 2014-02-26 NOTE — Progress Notes (Signed)
Pre visit review using our clinic review tool, if applicable. No additional management support is needed unless otherwise documented below in the visit note. 

## 2014-02-27 LAB — RENAL FUNCTION PANEL
Albumin: 3.9 g/dL (ref 3.5–5.2)
BUN: 27 mg/dL — ABNORMAL HIGH (ref 6–23)
CO2: 23 mEq/L (ref 19–32)
CREATININE: 1.6 mg/dL — AB (ref 0.4–1.5)
Calcium: 9.4 mg/dL (ref 8.4–10.5)
Chloride: 109 mEq/L (ref 96–112)
GFR: 53.92 mL/min — AB (ref >60.00)
GLUCOSE: 99 mg/dL (ref 70–99)
Phosphorus: 2.8 mg/dL (ref 2.3–4.6)
Potassium: 4.2 mEq/L (ref 3.5–5.1)
Sodium: 139 mEq/L (ref 135–145)

## 2014-02-27 LAB — CBC
HCT: 35.9 % — ABNORMAL LOW (ref 39.0–52.0)
HEMOGLOBIN: 12 g/dL — AB (ref 13.0–17.0)
MCHC: 33.5 g/dL (ref 30.0–36.0)
MCV: 85 fl (ref 78.0–100.0)
Platelets: 240 10*3/uL (ref 150.0–400.0)
RBC: 4.23 Mil/uL (ref 4.22–5.81)
RDW: 13.6 % (ref 11.5–15.5)
WBC: 7.5 10*3/uL (ref 4.0–10.5)

## 2014-02-27 LAB — LIPID PANEL
Cholesterol: 142 mg/dL (ref 0–200)
HDL: 29 mg/dL — ABNORMAL LOW (ref >39.00)
LDL CALC: 85 mg/dL (ref 0–99)
NONHDL: 113
Total CHOL/HDL Ratio: 5
Triglycerides: 139 mg/dL (ref 0.0–149.0)
VLDL: 27.8 mg/dL (ref 0.0–40.0)

## 2014-02-27 LAB — HEPATIC FUNCTION PANEL
ALT: 19 U/L (ref 0–53)
AST: 26 U/L (ref 0–37)
Albumin: 3.9 g/dL (ref 3.5–5.2)
Alkaline Phosphatase: 52 U/L (ref 39–117)
BILIRUBIN TOTAL: 0.6 mg/dL (ref 0.2–1.2)
Bilirubin, Direct: 0.1 mg/dL (ref 0.0–0.3)
Total Protein: 8.1 g/dL (ref 6.0–8.3)

## 2014-02-27 LAB — HEMOGLOBIN A1C: Hgb A1c MFr Bld: 8.7 % — ABNORMAL HIGH (ref 4.6–6.5)

## 2014-02-27 LAB — TSH: TSH: 1.25 u[IU]/mL (ref 0.35–4.50)

## 2014-03-01 ENCOUNTER — Encounter: Payer: Self-pay | Admitting: Family Medicine

## 2014-03-01 NOTE — Assessment & Plan Note (Signed)
Improving but inadequate. Encouraged to minimize simple carbs and increase insulin by units every 3 days if all sugars above 130

## 2014-03-01 NOTE — Assessment & Plan Note (Signed)
Improved on recheck, no changes
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
## 2014-03-01 NOTE — Assessment & Plan Note (Signed)
Mild, increase leafy greens and red meat. monitor

## 2014-03-01 NOTE — Assessment & Plan Note (Signed)
Mild, maintain adequate hydration and monitor

## 2014-03-01 NOTE — Assessment & Plan Note (Signed)
Tolerating statin, encouraged heart healthy diet, avoid trans fats, minimize simple carbs and saturated fats. Increase exercise as tolerated 

## 2014-03-26 ENCOUNTER — Other Ambulatory Visit: Payer: Self-pay | Admitting: Family Medicine

## 2014-04-24 ENCOUNTER — Other Ambulatory Visit: Payer: Self-pay | Admitting: Family Medicine

## 2014-06-01 ENCOUNTER — Ambulatory Visit (INDEPENDENT_AMBULATORY_CARE_PROVIDER_SITE_OTHER): Payer: Medicare PPO | Admitting: Ophthalmology

## 2014-06-04 ENCOUNTER — Other Ambulatory Visit: Payer: Self-pay | Admitting: Family Medicine

## 2014-06-04 NOTE — Telephone Encounter (Signed)
Med filled.  

## 2014-07-23 ENCOUNTER — Other Ambulatory Visit: Payer: Self-pay | Admitting: Family Medicine

## 2014-08-04 ENCOUNTER — Telehealth: Payer: Self-pay | Admitting: *Deleted

## 2014-08-04 ENCOUNTER — Encounter: Payer: Self-pay | Admitting: *Deleted

## 2014-08-04 NOTE — Telephone Encounter (Signed)
Pre-Visit Call completed with patient and chart updated.   Pre-Visit Info documented in Specialty Comments under [employer].  eal

## 2014-08-06 ENCOUNTER — Ambulatory Visit (INDEPENDENT_AMBULATORY_CARE_PROVIDER_SITE_OTHER): Payer: Commercial Managed Care - HMO | Admitting: Family Medicine

## 2014-08-06 ENCOUNTER — Encounter: Payer: Self-pay | Admitting: Family Medicine

## 2014-08-06 VITALS — BP 144/82 | HR 87 | Temp 97.8°F | Ht 69.0 in | Wt 172.5 lb

## 2014-08-06 DIAGNOSIS — B351 Tinea unguium: Secondary | ICD-10-CM

## 2014-08-06 DIAGNOSIS — IMO0002 Reserved for concepts with insufficient information to code with codable children: Secondary | ICD-10-CM

## 2014-08-06 DIAGNOSIS — E1149 Type 2 diabetes mellitus with other diabetic neurological complication: Secondary | ICD-10-CM | POA: Diagnosis not present

## 2014-08-06 DIAGNOSIS — N289 Disorder of kidney and ureter, unspecified: Secondary | ICD-10-CM

## 2014-08-06 DIAGNOSIS — R059 Cough, unspecified: Secondary | ICD-10-CM

## 2014-08-06 DIAGNOSIS — I10 Essential (primary) hypertension: Secondary | ICD-10-CM

## 2014-08-06 DIAGNOSIS — E669 Obesity, unspecified: Secondary | ICD-10-CM | POA: Diagnosis not present

## 2014-08-06 DIAGNOSIS — E1169 Type 2 diabetes mellitus with other specified complication: Secondary | ICD-10-CM

## 2014-08-06 DIAGNOSIS — E782 Mixed hyperlipidemia: Secondary | ICD-10-CM

## 2014-08-06 DIAGNOSIS — E119 Type 2 diabetes mellitus without complications: Secondary | ICD-10-CM | POA: Diagnosis not present

## 2014-08-06 DIAGNOSIS — R05 Cough: Secondary | ICD-10-CM

## 2014-08-06 DIAGNOSIS — E1165 Type 2 diabetes mellitus with hyperglycemia: Secondary | ICD-10-CM

## 2014-08-06 DIAGNOSIS — D649 Anemia, unspecified: Secondary | ICD-10-CM | POA: Diagnosis not present

## 2014-08-06 LAB — CBC
HCT: 37.3 % — ABNORMAL LOW (ref 39.0–52.0)
Hemoglobin: 12.6 g/dL — ABNORMAL LOW (ref 13.0–17.0)
MCHC: 33.7 g/dL (ref 30.0–36.0)
MCV: 82.5 fl (ref 78.0–100.0)
Platelets: 241 10*3/uL (ref 150.0–400.0)
RBC: 4.52 Mil/uL (ref 4.22–5.81)
RDW: 13.8 % (ref 11.5–15.5)
WBC: 7.4 10*3/uL (ref 4.0–10.5)

## 2014-08-06 LAB — COMPREHENSIVE METABOLIC PANEL
ALBUMIN: 3.8 g/dL (ref 3.5–5.2)
ALT: 15 U/L (ref 0–53)
AST: 19 U/L (ref 0–37)
Alkaline Phosphatase: 60 U/L (ref 39–117)
BILIRUBIN TOTAL: 0.7 mg/dL (ref 0.2–1.2)
BUN: 37 mg/dL — ABNORMAL HIGH (ref 6–23)
CO2: 26 meq/L (ref 19–32)
Calcium: 9.4 mg/dL (ref 8.4–10.5)
Chloride: 106 mEq/L (ref 96–112)
Creatinine, Ser: 1.66 mg/dL — ABNORMAL HIGH (ref 0.40–1.50)
GFR: 52.73 mL/min — AB (ref >60.00)
Glucose, Bld: 278 mg/dL — ABNORMAL HIGH (ref 70–99)
POTASSIUM: 4.4 meq/L (ref 3.5–5.1)
Sodium: 138 mEq/L (ref 135–145)
TOTAL PROTEIN: 7.8 g/dL (ref 6.0–8.3)

## 2014-08-06 LAB — LIPID PANEL
Cholesterol: 161 mg/dL (ref 0–200)
HDL: 28.2 mg/dL — AB (ref >39.00)
LDL Cholesterol: 107 mg/dL — ABNORMAL HIGH (ref 0–99)
NONHDL: 132.8
Total CHOL/HDL Ratio: 6
Triglycerides: 129 mg/dL (ref 0.0–149.0)
VLDL: 25.8 mg/dL (ref 0.0–40.0)

## 2014-08-06 LAB — TSH: TSH: 1.86 u[IU]/mL (ref 0.35–4.50)

## 2014-08-06 LAB — HEMOGLOBIN A1C: HEMOGLOBIN A1C: 8.7 % — AB (ref 4.6–6.5)

## 2014-08-06 MED ORDER — INSULIN NPH ISOPHANE & REGULAR (70-30) 100 UNIT/ML ~~LOC~~ SUSP: SUBCUTANEOUS | Status: DC

## 2014-08-06 MED ORDER — CARVEDILOL 25 MG PO TABS
25.0000 mg | ORAL_TABLET | Freq: Two times a day (BID) | ORAL | Status: DC
Start: 2014-08-06 — End: 2015-03-26

## 2014-08-06 MED ORDER — ATORVASTATIN CALCIUM 80 MG PO TABS: 80.0000 mg | ORAL_TABLET | Freq: Every day | ORAL | Status: DC

## 2014-08-06 MED ORDER — AMLODIPINE BESYLATE 10 MG PO TABS: 10.0000 mg | ORAL_TABLET | Freq: Every day | ORAL | Status: DC

## 2014-08-06 MED ORDER — LISINOPRIL-HYDROCHLOROTHIAZIDE 20-12.5 MG PO TABS: 1.0000 | ORAL_TABLET | Freq: Two times a day (BID) | ORAL | Status: DC

## 2014-08-06 NOTE — Patient Instructions (Addendum)
Basic Carbohydrate Counting for Diabetes Mellitus Carbohydrate counting is a method for keeping track of the amount of carbohydrates you eat. Eating carbohydrates naturally increases the level of sugar (glucose) in your blood, so it is important for you to know the amount that is okay for you to have in every meal. Carbohydrate counting helps keep the level of glucose in your blood within normal limits. The amount of carbohydrates allowed is different for every person. A dietitian can help you calculate the amount that is right for you. Once you know the amount of carbohydrates you can have, you can count the carbohydrates in the foods you want to eat. Carbohydrates are found in the following foods:  Grains, such as breads and cereals.  Dried beans and soy products.  Starchy vegetables, such as potatoes, peas, and corn.  Fruit and fruit juices.  Milk and yogurt.  Sweets and snack foods, such as cake, cookies, candy, chips, soft drinks, and fruit drinks. CARBOHYDRATE COUNTING There are two ways to count the carbohydrates in your food. You can use either of the methods or a combination of both. Reading the "Nutrition Facts" on [place] The "Nutrition Facts" is an area that is included on the labels of almost all packaged food and beverages in the [place]. It includes the serving size of that food or beverage and information about the nutrients in each serving of the food, including the grams (g) of carbohydrate per serving.  Decide the number of servings of this food or beverage that you will be able to eat or drink. Multiply that number of servings by the number of grams of carbohydrate that is listed on the label for that serving. The total will be the amount of carbohydrates you will be having when you eat or drink this food or beverage. Learning Standard Serving Sizes of Food When you eat food that is not packaged or does not include "Nutrition Facts" on the label, you need to  measure the servings in order to count the amount of carbohydrates.A serving of most carbohydrate-rich foods contains about 15 g of carbohydrates. The following list includes serving sizes of carbohydrate-rich foods that provide 15 g ofcarbohydrate per serving:   1 slice of bread (1 oz) or 1 six-inch tortilla.    of a hamburger bun or English muffin.  4-6 crackers.   cup unsweetened dry cereal.    cup hot cereal.   cup rice or pasta.    cup mashed potatoes or  of a large baked potato.  1 cup fresh fruit or one small piece of fruit.    cup canned or frozen fruit or fruit juice.  1 cup milk.   cup plain fat-free yogurt or yogurt sweetened with artificial sweeteners.   cup cooked dried beans or starchy vegetable, such as peas, corn, or potatoes.  Decide the number of standard-size servings that you will eat. Multiply that number of servings by 15 (the grams of carbohydrates in that serving). For example, if you eat 2 cups of strawberries, you will have eaten 2 servings and 30 g of carbohydrates (2 servings x 15 g = 30 g). For foods such as soups and casseroles, in which more than one food is mixed in, you will need to count the carbohydrates in each food that is included. EXAMPLE OF CARBOHYDRATE COUNTING Sample Dinner  3 oz chicken breast.   cup of brown rice.   cup of corn.  1 cup milk.   1 cup strawberries with  sugar-free whipped topping.  Carbohydrate Calculation Step 1: Identify the foods that contain carbohydrates:   Rice.   Corn.   Milk.   Strawberries. Step 2:Calculate the number of servings eaten of each:   2 servings of rice.   1 serving of corn.   1 serving of milk.   1 serving of strawberries. Step 3: Multiply each of those number of servings by 15 g:   2 servings of rice x 15 g = 30 g.   1 serving of corn x 15 g = 15 g.   1 serving of milk x 15 g = 15 g.   1 serving of strawberries x 15 g = 15 g. Step 4: Add  together all of the amounts to find the total grams of carbohydrates eaten: 30 g + 15 g + 15 g + 15 g = 75 g. Document Released: 05/22/2005 Document Revised: 10/06/2013 Document Reviewed: 04/18/2013 [facility] Patient Information 2015 [place], [place]. This information is not intended to replace advice given to you by your health care provider. Make sure you discuss any questions you have with your health care provider.   Preventive Care for Adults A healthy lifestyle and preventive care can promote health and wellness. Preventive health guidelines for men include the following key practices:  A routine yearly physical is a good way to check with your health care provider about your health and preventative screening. It is a chance to share any concerns and updates on your health and to receive a thorough exam.  Visit your dentist for a routine exam and preventative care every 6 months. Brush your teeth twice a day and floss once a day. Good oral hygiene prevents tooth decay and gum disease.  The frequency of eye exams is based on your age, health, family medical history, use of contact lenses, and other factors. Follow your health care provider's recommendations for frequency of eye exams.  Eat a healthy diet. Foods such as vegetables, fruits, whole grains, low-fat dairy products, and lean protein foods contain the nutrients you need without too many calories. Decrease your intake of foods high in solid fats, added sugars, and salt. Eat the right amount of calories for you.Get information about a proper diet from your health care provider, if necessary.  Regular physical exercise is one of the most important things you can do for your health. Most adults should get at least 150 minutes of moderate-intensity exercise (any activity that increases your heart rate and causes you to sweat) each week. In addition, most adults need muscle-strengthening exercises on 2 or more days a week.  Maintain a  healthy weight. The body mass index (BMI) is a screening tool to identify possible weight problems. It provides an estimate of body fat based on height and weight. Your health care provider can find your BMI and can help you achieve or maintain a healthy weight.For adults 20 years and older:  A BMI below 18.5 is considered underweight.  A BMI of 18.5 to 24.9 is normal.  A BMI of 25 to 29.9 is considered overweight.  A BMI of 30 and above is considered obese.  Maintain normal blood lipids and cholesterol levels by exercising and minimizing your intake of saturated fat. Eat a balanced diet with plenty of fruit and vegetables. Blood tests for lipids and cholesterol should begin at age 28 and be repeated every 5 years. If your lipid or cholesterol levels are high, you are over 50, or you are at high risk  for heart disease, you may need your cholesterol levels checked more frequently.Ongoing high lipid and cholesterol levels should be treated with medicines if diet and exercise are not working.  If you smoke, find out from your health care provider how to quit. If you do not use tobacco, do not start.  Lung cancer screening is recommended for adults aged 72-80 years who are at high risk for developing lung cancer because of a history of smoking. A yearly low-dose CT scan of the lungs is recommended for people who have at least a 30-pack-year history of smoking and are a current smoker or have quit within the past 15 years. A pack year of smoking is smoking an average of 1 pack of cigarettes a day for 1 year (for example: 1 pack a day for 30 years or 2 packs a day for 15 years). Yearly screening should continue until the smoker has stopped smoking for at least 15 years. Yearly screening should be stopped for people who develop a health problem that would prevent them from having lung cancer treatment.  If you choose to drink alcohol, do not have more than 2 drinks per day. One drink is considered to be  12 ounces (355 mL) of beer, 5 ounces (148 mL) of wine, or 1.5 ounces (44 mL) of liquor.  Avoid use of street drugs. Do not share needles with anyone. Ask for help if you need support or instructions about stopping the use of drugs.  High blood pressure causes heart disease and increases the risk of stroke. Your blood pressure should be checked at least every 1-2 years. Ongoing high blood pressure should be treated with medicines, if weight loss and exercise are not effective.  If you are 44-82 years old, ask your health care provider if you should take aspirin to prevent heart disease.  Diabetes screening involves taking a blood sample to check your fasting blood sugar level. This should be done once every 3 years, after age 46, if you are within normal weight and without risk factors for diabetes. Testing should be considered at a younger age or be carried out more frequently if you are overweight and have at least 1 risk factor for diabetes.  Colorectal cancer can be detected and often prevented. Most routine colorectal cancer screening begins at the age of 11 and continues through age 38. However, your health care provider may recommend screening at an earlier age if you have risk factors for colon cancer. On a yearly basis, your health care provider may provide home test kits to check for hidden blood in the stool. Use of a small camera at the end of a tube to directly examine the colon (sigmoidoscopy or colonoscopy) can detect the earliest forms of colorectal cancer. Talk to your health care provider about this at age 82, when routine screening begins. Direct exam of the colon should be repeated every 5-10 years through age 74, unless early forms of precancerous polyps or small growths are found.  People who are at an increased risk for hepatitis B should be screened for this virus. You are considered at high risk for hepatitis B if:  You were born in a country where hepatitis B occurs often. Talk  with your health care provider about which countries are considered high risk.  Your parents were born in a high-risk country and you have not received a shot to protect against hepatitis B (hepatitis B vaccine).  You have HIV or AIDS.  You use needles  to inject street drugs.  You live with, or have sex with, someone who has hepatitis B.  You are a man who has sex with other men (MSM).  You get hemodialysis treatment.  You take certain medicines for conditions such as cancer, organ transplantation, and autoimmune conditions.  Hepatitis C blood testing is recommended for all people born from 1 through 1965 and any individual with known risks for hepatitis C.  Practice safe sex. Use condoms and avoid high-risk sexual practices to reduce the spread of sexually transmitted infections (STIs). STIs include gonorrhea, chlamydia, syphilis, trichomonas, herpes, HPV, and human immunodeficiency virus (HIV). Herpes, HIV, and HPV are viral illnesses that have no cure. They can result in disability, cancer, and death.  If you are at risk of being infected with HIV, it is recommended that you take a prescription medicine daily to prevent HIV infection. This is called preexposure prophylaxis (PrEP). You are considered at risk if:  You are a man who has sex with other men (MSM) and have other risk factors.  You are a heterosexual man, are sexually active, and are at increased risk for HIV infection.  You take drugs by injection.  You are sexually active with a partner who has HIV.  Talk with your health care provider about whether you are at high risk of being infected with HIV. If you choose to begin PrEP, you should first be tested for HIV. You should then be tested every 3 months for as long as you are taking PrEP.  A one-time screening for abdominal aortic aneurysm (AAA) and surgical repair of large AAAs by ultrasound are recommended for men ages 79 to 36 years who are current or former  smokers.  Healthy men should no longer receive prostate-specific antigen (PSA) blood tests as part of routine cancer screening. Talk with your health care provider about prostate cancer screening.  Testicular cancer screening is not recommended for adult males who have no symptoms. Screening includes self-exam, a health care provider exam, and other screening tests. Consult with your health care provider about any symptoms you have or any concerns you have about testicular cancer.  Use sunscreen. Apply sunscreen liberally and repeatedly throughout the day. You should seek shade when your shadow is shorter than you. Protect yourself by wearing long sleeves, pants, a wide-brimmed hat, and sunglasses year round, whenever you are outdoors.  Once a month, do a whole-body skin exam, using a mirror to look at the skin on your back. Tell your health care provider about new moles, moles that have irregular borders, moles that are larger than a pencil eraser, or moles that have changed in shape or color.  Stay current with required vaccines (immunizations).  Influenza vaccine. All adults should be immunized every year.  Tetanus, diphtheria, and acellular pertussis (Td, Tdap) vaccine. An adult who has not previously received Tdap or who does not know his vaccine status should receive 1 dose of Tdap. This initial dose should be followed by tetanus and diphtheria toxoids (Td) booster doses every 10 years. Adults with an unknown or incomplete history of completing a 3-dose immunization series with Td-containing vaccines should begin or complete a primary immunization series including a Tdap dose. Adults should receive a Td booster every 10 years.  Varicella vaccine. An adult without evidence of immunity to varicella should receive 2 doses or a second dose if he has previously received 1 dose.  Human papillomavirus (HPV) vaccine. Males aged 90-21 years who have not received the vaccine  previously should receive  the 3-dose series. Males aged 22-26 years may be immunized. Immunization is recommended through the age of 59 years for any male who has sex with males and did not get any or all doses earlier. Immunization is recommended for any person with an immunocompromised condition through the age of 78 years if he did not get any or all doses earlier. During the 3-dose series, the second dose should be obtained 4-8 weeks after the first dose. The third dose should be obtained 24 weeks after the first dose and 16 weeks after the second dose.  Zoster vaccine. One dose is recommended for adults aged 58 years or older unless certain conditions are present.  Measles, mumps, and rubella (MMR) vaccine. Adults born before [birth year] generally are considered immune to measles and mumps. Adults born in [birth year] or later should have 1 or more doses of MMR vaccine unless there is a contraindication to the vaccine or there is laboratory evidence of immunity to each of the three diseases. A routine second dose of MMR vaccine should be obtained at least 28 days after the first dose for students attending postsecondary schools, health care workers, or international travelers. People who received inactivated measles vaccine or an unknown type of measles vaccine during 1963-1967 should receive 2 doses of MMR vaccine. People who received inactivated mumps vaccine or an unknown type of mumps vaccine before 1979 and are at high risk for mumps infection should consider immunization with 2 doses of MMR vaccine. Unvaccinated health care workers born before [birth year] who lack laboratory evidence of measles, mumps, or rubella immunity or laboratory confirmation of disease should consider measles and mumps immunization with 2 doses of MMR vaccine or rubella immunization with 1 dose of MMR vaccine.  Pneumococcal 13-valent conjugate (PCV13) vaccine. When indicated, a person who is uncertain of his immunization history and has no record of immunization should  receive the PCV13 vaccine. An adult aged 11 years or older who has certain medical conditions and has not been previously immunized should receive 1 dose of PCV13 vaccine. This PCV13 should be followed with a dose of pneumococcal polysaccharide (PPSV23) vaccine. The PPSV23 vaccine dose should be obtained at least 8 weeks after the dose of PCV13 vaccine. An adult aged 8 years or older who has certain medical conditions and previously received 1 or more doses of PPSV23 vaccine should receive 1 dose of PCV13. The PCV13 vaccine dose should be obtained 1 or more years after the last PPSV23 vaccine dose.  Pneumococcal polysaccharide (PPSV23) vaccine. When PCV13 is also indicated, PCV13 should be obtained first. All adults aged 51 years and older should be immunized. An adult younger than age 21 years who has certain medical conditions should be immunized. Any person who resides in a nursing home or long-term care facility should be immunized. An adult smoker should be immunized. People with an immunocompromised condition and certain other conditions should receive both PCV13 and PPSV23 vaccines. People with human immunodeficiency virus (HIV) infection should be immunized as soon as possible after diagnosis. Immunization during chemotherapy or radiation therapy should be avoided. Routine use of PPSV23 vaccine is not recommended for American Indians, [place] Natives, or people younger than 65 years unless there are medical conditions that require PPSV23 vaccine. When indicated, people who have unknown immunization and have no record of immunization should receive PPSV23 vaccine. One-time revaccination 5 years after the first dose of PPSV23 is recommended for people aged 19-64 years who have chronic kidney failure, nephrotic  syndrome, asplenia, or immunocompromised conditions. People who received 1-2 doses of PPSV23 before age 83 years should receive another dose of PPSV23 vaccine at age 42 years or later if at least 5  years have passed since the previous dose. Doses of PPSV23 are not needed for people immunized with PPSV23 at or after age 95 years.  Meningococcal vaccine. Adults with asplenia or persistent complement component deficiencies should receive 2 doses of quadrivalent meningococcal conjugate (MenACWY-D) vaccine. The doses should be obtained at least 2 months apart. Microbiologists working with certain meningococcal bacteria, [employer] recruits, people at risk during an outbreak, and people who travel to or live in countries with a high rate of meningitis should be immunized. A first-year college student up through age 54 years who is living in a residence hall should receive a dose if he did not receive a dose on or after his 16th birthday. Adults who have certain high-risk conditions should receive one or more doses of vaccine.  Hepatitis A vaccine. Adults who wish to be protected from this disease, have certain high-risk conditions, work with hepatitis A-infected animals, work in hepatitis A research labs, or travel to or work in countries with a high rate of hepatitis A should be immunized. Adults who were previously unvaccinated and who anticipate close contact with an international adoptee during the first 60 days after arrival in the [place] from a country with a high rate of hepatitis A should be immunized.  Hepatitis B vaccine. Adults should be immunized if they wish to be protected from this disease, have certain high-risk conditions, may be exposed to blood or other infectious body fluids, are household contacts or sex partners of hepatitis B positive people, are clients or workers in certain care facilities, or travel to or work in countries with a high rate of hepatitis B.  Haemophilus influenzae type b (Hib) vaccine. A previously unvaccinated person with asplenia or sickle cell disease or having a scheduled splenectomy should receive 1 dose of Hib vaccine. Regardless of previous immunization, a  recipient of a hematopoietic stem cell transplant should receive a 3-dose series 6-12 months after his successful transplant. Hib vaccine is not recommended for adults with HIV infection. Preventive Service / Frequency Ages 40 to 22  Blood pressure check.** / Every 1 to 2 years.  Lipid and cholesterol check.** / Every 5 years beginning at age 44.  Hepatitis C blood test.** / For any individual with known risks for hepatitis C.  Skin self-exam. / Monthly.  Influenza vaccine. / Every year.  Tetanus, diphtheria, and acellular pertussis (Tdap, Td) vaccine.** / Consult your health care provider. 1 dose of Td every 10 years.  Varicella vaccine.** / Consult your health care provider.  HPV vaccine. / 3 doses over 6 months, if 65 or younger.  Measles, mumps, rubella (MMR) vaccine.** / You need at least 1 dose of MMR if you were born in 1957 or later. You may also need a second dose.  Pneumococcal 13-valent conjugate (PCV13) vaccine.** / Consult your health care provider.  Pneumococcal polysaccharide (PPSV23) vaccine.** / 1 to 2 doses if you smoke cigarettes or if you have certain conditions.  Meningococcal vaccine.** / 1 dose if you are age 67 to 56 years and a Market researcher living in a residence hall, or have one of several medical conditions. You may also need additional booster doses.  Hepatitis A vaccine.** / Consult your health care provider.  Hepatitis B vaccine.** / Consult your health care provider.  Haemophilus influenzae type b (Hib) vaccine.** / Consult your health care provider. Ages 43 to 48  Blood pressure check.** / Every 1 to 2 years.  Lipid and cholesterol check.** / Every 5 years beginning at age 50.  Lung cancer screening. / Every year if you are aged 48-80 years and have a 30-pack-year history of smoking and currently smoke or have quit within the past 15 years. Yearly screening is stopped once you have quit smoking for at least 15 years or develop a  health problem that would prevent you from having lung cancer treatment.  Fecal occult blood test (FOBT) of stool. / Every year beginning at age 93 and continuing until age 65. You may not have to do this test if you get a colonoscopy every 10 years.  Flexible sigmoidoscopy** or colonoscopy.** / Every 5 years for a flexible sigmoidoscopy or every 10 years for a colonoscopy beginning at age 44 and continuing until age 43.  Hepatitis C blood test.** / For all people born from [birth year] through 1965 and any individual with known risks for hepatitis C.  Skin self-exam. / Monthly.  Influenza vaccine. / Every year.  Tetanus, diphtheria, and acellular pertussis (Tdap/Td) vaccine.** / Consult your health care provider. 1 dose of Td every 10 years.  Varicella vaccine.** / Consult your health care provider.  Zoster vaccine.** / 1 dose for adults aged 53 years or older.  Measles, mumps, rubella (MMR) vaccine.** / You need at least 1 dose of MMR if you were born in 1957 or later. You may also need a second dose.  Pneumococcal 13-valent conjugate (PCV13) vaccine.** / Consult your health care provider.  Pneumococcal polysaccharide (PPSV23) vaccine.** / 1 to 2 doses if you smoke cigarettes or if you have certain conditions.  Meningococcal vaccine.** / Consult your health care provider.  Hepatitis A vaccine.** / Consult your health care provider.  Hepatitis B vaccine.** / Consult your health care provider.  Haemophilus influenzae type b (Hib) vaccine.** / Consult your health care provider. Ages 72 and over  Blood pressure check.** / Every 1 to 2 years.  Lipid and cholesterol check.**/ Every 5 years beginning at age 92.  Lung cancer screening. / Every year if you are aged 73-80 years and have a 30-pack-year history of smoking and currently smoke or have quit within the past 15 years. Yearly screening is stopped once you have quit smoking for at least 15 years or develop a health problem that would  prevent you from having lung cancer treatment.  Fecal occult blood test (FOBT) of stool. / Every year beginning at age 19 and continuing until age 32. You may not have to do this test if you get a colonoscopy every 10 years.  Flexible sigmoidoscopy** or colonoscopy.** / Every 5 years for a flexible sigmoidoscopy or every 10 years for a colonoscopy beginning at age 81 and continuing until age 30.  Hepatitis C blood test.** / For all people born from [birth year] through 1963 and any individual with known risks for hepatitis C.  Abdominal aortic aneurysm (AAA) screening.** / A one-time screening for ages 57 to 65 years who are current or former smokers.  Skin self-exam. / Monthly.  Influenza vaccine. / Every year.  Tetanus, diphtheria, and acellular pertussis (Tdap/Td) vaccine.** / 1 dose of Td every 10 years.  Varicella vaccine.** / Consult your health care provider.  Zoster vaccine.** / 1 dose for adults aged 72 years or older.  Pneumococcal 13-valent conjugate (PCV13) vaccine.** / Consult your health  care provider.  Pneumococcal polysaccharide (PPSV23) vaccine.** / 1 dose for all adults aged 85 years and older.  Meningococcal vaccine.** / Consult your health care provider.  Hepatitis A vaccine.** / Consult your health care provider.  Hepatitis B vaccine.** / Consult your health care provider.  Haemophilus influenzae type b (Hib) vaccine.** / Consult your health care provider. **Family history and personal history of risk and conditions may change your health care provider's recommendations. Document Released: 07/18/2001 Document Revised: 05/27/2013 Document Reviewed: 10/17/2010 [facility] Patient Information 2015 [place], [place]. This information is not intended to replace advice given to you by your health care provider. Make sure you discuss any questions you have with your health care provider.

## 2014-08-06 NOTE — Progress Notes (Signed)
Pre visit review using our clinic review tool, if applicable. No additional management support is needed unless otherwise documented below in the visit note. 

## 2014-08-16 ENCOUNTER — Encounter: Payer: Self-pay | Admitting: Family Medicine

## 2014-08-16 NOTE — Assessment & Plan Note (Signed)
Labile, hi of 347 and low of 120. Discussed need for small, frequent meals with lean proteins. Increase insulin by 2 units bid and report numbers in 2 weeks

## 2014-08-16 NOTE — Assessment & Plan Note (Signed)
Tolerating statin, encouraged heart healthy diet, avoid trans fats, minimize simple carbs and saturated fats. Increase exercise as tolerated 
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
## 2014-08-16 NOTE — Assessment & Plan Note (Signed)
Stable--will continue to monitor

## 2014-08-16 NOTE — Assessment & Plan Note (Signed)
Stable, mild. Increase leafy greens, consider increased lean red meat and using cast iron cookware. Continue to monitor, report any concerns

## 2014-08-25 ENCOUNTER — Ambulatory Visit: Payer: Commercial Managed Care - HMO

## 2014-11-17 ENCOUNTER — Ambulatory Visit: Payer: Medicare PPO | Admitting: Family Medicine

## 2014-11-27 ENCOUNTER — Ambulatory Visit (INDEPENDENT_AMBULATORY_CARE_PROVIDER_SITE_OTHER): Payer: Commercial Managed Care - HMO | Admitting: Family Medicine

## 2014-11-27 ENCOUNTER — Encounter: Payer: Self-pay | Admitting: Family Medicine

## 2014-11-27 VITALS — BP 140/80 | HR 72 | Temp 98.0°F | Ht 69.0 in | Wt 186.5 lb

## 2014-11-27 DIAGNOSIS — E1165 Type 2 diabetes mellitus with hyperglycemia: Secondary | ICD-10-CM

## 2014-11-27 DIAGNOSIS — E1149 Type 2 diabetes mellitus with other diabetic neurological complication: Secondary | ICD-10-CM

## 2014-11-27 DIAGNOSIS — Z23 Encounter for immunization: Secondary | ICD-10-CM

## 2014-11-27 DIAGNOSIS — N289 Disorder of kidney and ureter, unspecified: Secondary | ICD-10-CM

## 2014-11-27 DIAGNOSIS — I10 Essential (primary) hypertension: Secondary | ICD-10-CM

## 2014-11-27 DIAGNOSIS — IMO0002 Reserved for concepts with insufficient information to code with codable children: Secondary | ICD-10-CM

## 2014-11-27 DIAGNOSIS — E782 Mixed hyperlipidemia: Secondary | ICD-10-CM

## 2014-11-27 DIAGNOSIS — D649 Anemia, unspecified: Secondary | ICD-10-CM

## 2014-11-27 LAB — LIPID PANEL
CHOL/HDL RATIO: 5
Cholesterol: 145 mg/dL (ref 0–200)
HDL: 30.9 mg/dL — AB (ref >39.00)
LDL Cholesterol: 84 mg/dL (ref 0–99)
NONHDL: 114.1
Triglycerides: 153 mg/dL — ABNORMAL HIGH (ref 0.0–149.0)
VLDL: 30.6 mg/dL (ref 0.0–40.0)

## 2014-11-27 LAB — CBC
HCT: 35.8 % — ABNORMAL LOW (ref 39.0–52.0)
HEMOGLOBIN: 11.9 g/dL — AB (ref 13.0–17.0)
MCHC: 33.1 g/dL (ref 30.0–36.0)
MCV: 84.2 fl (ref 78.0–100.0)
PLATELETS: 232 10*3/uL (ref 150.0–400.0)
RBC: 4.25 Mil/uL (ref 4.22–5.81)
RDW: 13.8 % (ref 11.5–15.5)
WBC: 7.2 10*3/uL (ref 4.0–10.5)

## 2014-11-27 LAB — COMPREHENSIVE METABOLIC PANEL
ALK PHOS: 58 U/L (ref 39–117)
ALT: 15 U/L (ref 0–53)
AST: 19 U/L (ref 0–37)
Albumin: 3.9 g/dL (ref 3.5–5.2)
BILIRUBIN TOTAL: 0.7 mg/dL (ref 0.2–1.2)
BUN: 31 mg/dL — ABNORMAL HIGH (ref 6–23)
CO2: 26 mEq/L (ref 19–32)
Calcium: 9.3 mg/dL (ref 8.4–10.5)
Chloride: 106 mEq/L (ref 96–112)
Creatinine, Ser: 1.61 mg/dL — ABNORMAL HIGH (ref 0.40–1.50)
GFR: 54.57 mL/min — ABNORMAL LOW (ref >60.00)
Glucose, Bld: 279 mg/dL — ABNORMAL HIGH (ref 70–99)
POTASSIUM: 4.1 meq/L (ref 3.5–5.1)
SODIUM: 137 meq/L (ref 135–145)
TOTAL PROTEIN: 7.9 g/dL (ref 6.0–8.3)

## 2014-11-27 LAB — TSH: TSH: 2.18 u[IU]/mL (ref 0.35–4.50)

## 2014-11-27 LAB — HEMOGLOBIN A1C: Hgb A1c MFr Bld: 8.3 % — ABNORMAL HIGH (ref 4.6–6.5)

## 2014-11-27 MED ORDER — INSULIN NPH ISOPHANE & REGULAR (70-30) 100 UNIT/ML ~~LOC~~ SUSP: SUBCUTANEOUS | Status: DC

## 2014-11-27 NOTE — Assessment & Plan Note (Signed)
Tolerating statin, encouraged heart healthy diet, avoid trans fats, minimize simple carbs and saturated fats. Increase exercise as tolerated 

## 2014-11-27 NOTE — Progress Notes (Signed)
Pre visit review using our clinic review tool, if applicable. No additional management support is needed unless otherwise documented below in the visit note. 

## 2014-11-27 NOTE — Assessment & Plan Note (Signed)
Well controlled, no changes to meds. Encouraged heart healthy diet such as the DASH diet and exercise as tolerated.  °

## 2014-11-27 NOTE — Patient Instructions (Signed)
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
## 2014-12-13 ENCOUNTER — Encounter: Payer: Self-pay | Admitting: Family Medicine

## 2014-12-13 NOTE — Assessment & Plan Note (Signed)
Numbers improving. Lowest sugar recently at 125

## 2014-12-13 NOTE — Assessment & Plan Note (Signed)
Increase leafy greens, consider increased lean red meat and using cast iron cookware. Continue to monitor, report any concerns 

## 2014-12-13 NOTE — Assessment & Plan Note (Signed)
Stable will continue to monitor, patient reminded to maintain adequate hydration

## 2015-02-22 ENCOUNTER — Ambulatory Visit (INDEPENDENT_AMBULATORY_CARE_PROVIDER_SITE_OTHER): Payer: Commercial Managed Care - HMO | Admitting: Ophthalmology

## 2015-02-26 ENCOUNTER — Telehealth: Payer: Self-pay | Admitting: *Deleted

## 2015-02-26 ENCOUNTER — Ambulatory Visit (INDEPENDENT_AMBULATORY_CARE_PROVIDER_SITE_OTHER): Payer: Commercial Managed Care - HMO | Admitting: Ophthalmology

## 2015-02-26 NOTE — Telephone Encounter (Signed)
Patient's wife called stating he needs a referral to Ophthalmologist Dr. Zigmund Daniel for cataracts.    Please advise.

## 2015-02-27 ENCOUNTER — Other Ambulatory Visit: Payer: Self-pay | Admitting: Family Medicine

## 2015-02-27 DIAGNOSIS — H269 Unspecified cataract: Secondary | ICD-10-CM

## 2015-03-16 ENCOUNTER — Other Ambulatory Visit: Payer: Commercial Managed Care - HMO

## 2015-03-17 ENCOUNTER — Other Ambulatory Visit (INDEPENDENT_AMBULATORY_CARE_PROVIDER_SITE_OTHER): Payer: Commercial Managed Care - HMO

## 2015-03-17 DIAGNOSIS — I10 Essential (primary) hypertension: Secondary | ICD-10-CM

## 2015-03-17 DIAGNOSIS — E1165 Type 2 diabetes mellitus with hyperglycemia: Secondary | ICD-10-CM | POA: Diagnosis not present

## 2015-03-17 DIAGNOSIS — E782 Mixed hyperlipidemia: Secondary | ICD-10-CM

## 2015-03-17 DIAGNOSIS — IMO0002 Reserved for concepts with insufficient information to code with codable children: Secondary | ICD-10-CM

## 2015-03-17 DIAGNOSIS — E1149 Type 2 diabetes mellitus with other diabetic neurological complication: Secondary | ICD-10-CM | POA: Diagnosis not present

## 2015-03-17 LAB — HEMOGLOBIN A1C: HEMOGLOBIN A1C: 8.9 % — AB (ref 4.6–6.5)

## 2015-03-17 LAB — COMPREHENSIVE METABOLIC PANEL
ALT: 15 U/L (ref 0–53)
AST: 18 U/L (ref 0–37)
Albumin: 3.6 g/dL (ref 3.5–5.2)
Alkaline Phosphatase: 57 U/L (ref 39–117)
BUN: 28 mg/dL — ABNORMAL HIGH (ref 6–23)
CALCIUM: 9 mg/dL (ref 8.4–10.5)
CHLORIDE: 107 meq/L (ref 96–112)
CO2: 25 meq/L (ref 19–32)
Creatinine, Ser: 1.44 mg/dL (ref 0.40–1.50)
GFR: 62.02 mL/min (ref >60.00)
GLUCOSE: 309 mg/dL — AB (ref 70–99)
POTASSIUM: 4.2 meq/L (ref 3.5–5.1)
SODIUM: 139 meq/L (ref 135–145)
Total Bilirubin: 0.6 mg/dL (ref 0.2–1.2)
Total Protein: 7.4 g/dL (ref 6.0–8.3)

## 2015-03-17 LAB — CBC
HCT: 35 % — ABNORMAL LOW (ref 39.0–52.0)
HEMOGLOBIN: 11.5 g/dL — AB (ref 13.0–17.0)
MCHC: 32.8 g/dL (ref 30.0–36.0)
MCV: 84.6 fl (ref 78.0–100.0)
Platelets: 206 10*3/uL (ref 150.0–400.0)
RBC: 4.13 Mil/uL — ABNORMAL LOW (ref 4.22–5.81)
RDW: 14 % (ref 11.5–15.5)
WBC: 6.8 10*3/uL (ref 4.0–10.5)

## 2015-03-17 LAB — TSH: TSH: 1.44 u[IU]/mL (ref 0.35–4.50)

## 2015-03-17 LAB — LIPID PANEL
CHOL/HDL RATIO: 4
Cholesterol: 127 mg/dL (ref 0–200)
HDL: 29.4 mg/dL — AB (ref >39.00)
LDL Cholesterol: 74 mg/dL (ref 0–99)
NONHDL: 97.23
Triglycerides: 114 mg/dL (ref 0.0–149.0)
VLDL: 22.8 mg/dL (ref 0.0–40.0)

## 2015-03-23 ENCOUNTER — Ambulatory Visit (INDEPENDENT_AMBULATORY_CARE_PROVIDER_SITE_OTHER): Payer: Commercial Managed Care - HMO | Admitting: Family Medicine

## 2015-03-23 ENCOUNTER — Encounter: Payer: Self-pay | Admitting: Family Medicine

## 2015-03-23 VITALS — BP 170/81 | HR 72 | Temp 97.7°F | Ht 69.0 in | Wt 186.0 lb

## 2015-03-23 DIAGNOSIS — E1169 Type 2 diabetes mellitus with other specified complication: Secondary | ICD-10-CM

## 2015-03-23 DIAGNOSIS — E782 Mixed hyperlipidemia: Secondary | ICD-10-CM

## 2015-03-23 DIAGNOSIS — D649 Anemia, unspecified: Secondary | ICD-10-CM | POA: Diagnosis not present

## 2015-03-23 DIAGNOSIS — E119 Type 2 diabetes mellitus without complications: Secondary | ICD-10-CM | POA: Diagnosis not present

## 2015-03-23 DIAGNOSIS — I10 Essential (primary) hypertension: Secondary | ICD-10-CM | POA: Diagnosis not present

## 2015-03-23 DIAGNOSIS — E669 Obesity, unspecified: Secondary | ICD-10-CM

## 2015-03-23 DIAGNOSIS — B351 Tinea unguium: Secondary | ICD-10-CM | POA: Diagnosis not present

## 2015-03-23 DIAGNOSIS — N289 Disorder of kidney and ureter, unspecified: Secondary | ICD-10-CM

## 2015-03-23 MED ORDER — FERROUS FUMARATE-FOLIC ACID 324-1 MG PO TABS: 1.0000 | ORAL_TABLET | Freq: Every day | ORAL | Status: DC

## 2015-03-23 MED ORDER — ATORVASTATIN CALCIUM 80 MG PO TABS: 80.0000 mg | ORAL_TABLET | Freq: Every day | ORAL | Status: DC

## 2015-03-23 NOTE — Patient Instructions (Signed)

## 2015-03-23 NOTE — Progress Notes (Signed)
Pre visit review using our clinic review tool, if applicable. No additional management support is needed unless otherwise documented below in the visit note. 
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
## 2015-03-26 ENCOUNTER — Other Ambulatory Visit: Payer: Self-pay | Admitting: Family Medicine

## 2015-03-31 ENCOUNTER — Encounter: Payer: Self-pay | Admitting: Endocrinology

## 2015-03-31 ENCOUNTER — Ambulatory Visit (INDEPENDENT_AMBULATORY_CARE_PROVIDER_SITE_OTHER): Payer: Commercial Managed Care - HMO | Admitting: Endocrinology

## 2015-03-31 VITALS — BP 143/84 | HR 80 | Temp 97.0°F | Ht 69.0 in | Wt 184.0 lb

## 2015-03-31 DIAGNOSIS — Z794 Long term (current) use of insulin: Secondary | ICD-10-CM

## 2015-03-31 DIAGNOSIS — N181 Chronic kidney disease, stage 1: Secondary | ICD-10-CM | POA: Diagnosis not present

## 2015-03-31 DIAGNOSIS — E1122 Type 2 diabetes mellitus with diabetic chronic kidney disease: Secondary | ICD-10-CM | POA: Diagnosis not present

## 2015-03-31 MED ORDER — INSULIN REGULAR HUMAN 100 UNIT/ML IJ SOLN: INTRAMUSCULAR | Status: DC

## 2015-03-31 NOTE — Patient Instructions (Addendum)
good diet and exercise significantly improve the control of your diabetes.  please let me know if you wish to be referred to a dietician.  high blood sugar is very risky to your health.  you should see an eye doctor and dentist every year.  It is very important to get all recommended vaccinations.  controlling your blood pressure and cholesterol drastically reduces the damage diabetes does to your body.  Those who smoke should quit.  please discuss these with your doctor.  check your blood sugar twice a day.  vary the time of day when you check, between before the 3 meals, and at bedtime.  also check if you have symptoms of your blood sugar being too high or too low.  please keep a record of the readings and bring it to your next appointment here (or you can bring the meter itself).  You can write it on any piece of paper.  please call us sooner if your blood sugar goes below 70, or if you have a lot of readings over 200.   Please change your current insulin to regular insulin 3 times a day (just before each meal), 20-10-20 units. Please come back for a follow-up appointment in 2 weeks.
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
## 2015-04-01 ENCOUNTER — Ambulatory Visit: Payer: Commercial Managed Care - HMO | Admitting: Podiatry

## 2015-04-01 DIAGNOSIS — E119 Type 2 diabetes mellitus without complications: Secondary | ICD-10-CM | POA: Insufficient documentation

## 2015-04-04 DIAGNOSIS — B351 Tinea unguium: Secondary | ICD-10-CM | POA: Insufficient documentation

## 2015-04-04 DIAGNOSIS — D649 Anemia, unspecified: Secondary | ICD-10-CM | POA: Insufficient documentation

## 2015-04-04 NOTE — Assessment & Plan Note (Signed)
Tolerating statin, encouraged heart healthy diet, avoid trans fats, minimize simple carbs and saturated fats. Increase exercise as tolerated 

## 2015-04-04 NOTE — Assessment & Plan Note (Addendum)
Referred to endocrinology for further consideration due to poor control persistent. hgba1c unacceptable, minimize simple carbs. Increase exercise as tolerated. Continue current meds, increase insulin and monitor sugars tid adjusting as needed

## 2015-04-04 NOTE — Assessment & Plan Note (Signed)
Started on iron, check iron studies, referred to gastroenterology for further evaluation.

## 2015-04-04 NOTE — Assessment & Plan Note (Addendum)
Improved on recheck. no changes to meds. Encouraged heart healthy diet such as the DASH diet and exercise as tolerated.

## 2015-04-04 NOTE — Assessment & Plan Note (Signed)
Slightly improved continue adequate hydration and be cautious about OTC meds.

## 2015-04-04 NOTE — Assessment & Plan Note (Signed)
Referred to podiatry for treatment and possibel diabetic shoes

## 2015-04-08 ENCOUNTER — Ambulatory Visit: Payer: Commercial Managed Care - HMO | Admitting: Podiatry

## 2015-04-09 ENCOUNTER — Ambulatory Visit: Payer: Commercial Managed Care - HMO | Admitting: Family Medicine

## 2015-04-12 ENCOUNTER — Emergency Department (HOSPITAL_BASED_OUTPATIENT_CLINIC_OR_DEPARTMENT_OTHER)
Admission: EM | Admit: 2015-04-12 | Discharge: 2015-04-12 | Disposition: A | Discharge status: Home / Self Care | Payer: Commercial Managed Care - HMO | Attending: Emergency Medicine | Admitting: Emergency Medicine

## 2015-04-12 ENCOUNTER — Encounter (HOSPITAL_BASED_OUTPATIENT_CLINIC_OR_DEPARTMENT_OTHER): Payer: Self-pay | Admitting: Emergency Medicine

## 2015-04-12 DIAGNOSIS — Z8619 Personal history of other infectious and parasitic diseases: Secondary | ICD-10-CM | POA: Insufficient documentation

## 2015-04-12 DIAGNOSIS — I10 Essential (primary) hypertension: Secondary | ICD-10-CM | POA: Diagnosis not present

## 2015-04-12 DIAGNOSIS — E1140 Type 2 diabetes mellitus with diabetic neuropathy, unspecified: Secondary | ICD-10-CM | POA: Insufficient documentation

## 2015-04-12 DIAGNOSIS — Z87891 Personal history of nicotine dependence: Secondary | ICD-10-CM | POA: Insufficient documentation

## 2015-04-12 DIAGNOSIS — E119 Type 2 diabetes mellitus without complications: Secondary | ICD-10-CM | POA: Insufficient documentation

## 2015-04-12 DIAGNOSIS — N471 Phimosis: Secondary | ICD-10-CM | POA: Insufficient documentation

## 2015-04-12 DIAGNOSIS — Z7982 Long term (current) use of aspirin: Secondary | ICD-10-CM | POA: Insufficient documentation

## 2015-04-12 DIAGNOSIS — N3941 Urge incontinence: Secondary | ICD-10-CM | POA: Diagnosis not present

## 2015-04-12 DIAGNOSIS — Z794 Long term (current) use of insulin: Secondary | ICD-10-CM | POA: Insufficient documentation

## 2015-04-12 DIAGNOSIS — R339 Retention of urine, unspecified: Secondary | ICD-10-CM | POA: Diagnosis not present

## 2015-04-12 DIAGNOSIS — E785 Hyperlipidemia, unspecified: Secondary | ICD-10-CM | POA: Diagnosis not present

## 2015-04-12 DIAGNOSIS — Z79899 Other long term (current) drug therapy: Secondary | ICD-10-CM | POA: Insufficient documentation

## 2015-04-12 DIAGNOSIS — E1121 Type 2 diabetes mellitus with diabetic nephropathy: Secondary | ICD-10-CM | POA: Insufficient documentation

## 2015-04-12 DIAGNOSIS — N401 Enlarged prostate with lower urinary tract symptoms: Secondary | ICD-10-CM | POA: Diagnosis not present

## 2015-04-12 DIAGNOSIS — R351 Nocturia: Secondary | ICD-10-CM | POA: Diagnosis not present

## 2015-04-12 DIAGNOSIS — N138 Other obstructive and reflux uropathy: Secondary | ICD-10-CM | POA: Diagnosis not present

## 2015-04-12 LAB — URINE MICROSCOPIC-ADD ON

## 2015-04-12 LAB — URINALYSIS, ROUTINE W REFLEX MICROSCOPIC
Bilirubin Urine: NEGATIVE
Glucose, UA: 250 mg/dL — AB
Ketones, ur: NEGATIVE mg/dL
Leukocytes, UA: NEGATIVE
NITRITE: NEGATIVE
Protein, ur: 30 mg/dL — AB
SPECIFIC GRAVITY, URINE: 1.014 (ref 1.005–1.030)
UROBILINOGEN UA: 1 mg/dL (ref 0.0–1.0)
pH: 5.5 (ref 5.0–8.0)
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
## 2015-04-12 NOTE — Discharge Instructions (Signed)
Acute Urinary Retention, Male °Acute urinary retention is the temporary inability to urinate. °This is a common problem in older men. As men age their prostates become larger and block the flow of urine from the bladder. This is usually a problem that has come on gradually.  °HOME CARE INSTRUCTIONS °If you are sent home with a Foley catheter and a drainage system, you will need to discuss the best course of action with your health care provider. While the catheter is in, maintain a good intake of fluids. Keep the drainage bag emptied and lower than your catheter. This is so that contaminated urine will not flow back into your bladder, which could lead to a urinary tract infection. °There are two main types of drainage bags. One is a large bag that usually is used at night. It has a good capacity that will allow you to sleep through the night without having to empty it. The second type is called a leg bag. It has a smaller capacity, so it needs to be emptied more frequently. However, the main advantage is that it can be attached by a leg strap and can go underneath your clothing, allowing you the freedom to move about or leave your home. °Only take over-the-counter or prescription medicines for pain, discomfort, or fever as directed by your health care provider.  °SEEK MEDICAL CARE IF: °· You develop a low-grade fever. °· You experience spasms or leakage of urine with the spasms. °SEEK IMMEDIATE MEDICAL CARE IF:  °· You develop chills or fever. °· Your catheter stops draining urine. °· Your catheter falls out. °· You start to develop increased bleeding that does not respond to rest and increased fluid intake. °MAKE SURE YOU: °· Understand these instructions. °· Will watch your condition. °· Will get help right away if you are not doing well or get worse. °  °This information is not intended to replace advice given to you by your health care provider. Make sure you discuss any questions you have with your health care  provider. °  °Document Released: 08/28/2000 Document Revised: 10/06/2014 Document Reviewed: 10/31/2012 °Elsevier Interactive Patient Education ©2016 [employer] ° °

## 2015-04-12 NOTE — ED Notes (Signed)
Pt states the end of his penis is swollen and is not allowing him to pass urine. Last void was 0100 today. Reports some drainage from penis, but no other symptoms.

## 2015-04-12 NOTE — ED Notes (Signed)
Attempt to place 16 fr f/c unable x 2 RN's. Attempt to place cudae cath 16 fr unable also. Dr Tamera Punt in room.

## 2015-04-12 NOTE — ED Notes (Signed)
Bladder scan 617cc urine in bladder. Tolerated well.

## 2015-04-13 ENCOUNTER — Ambulatory Visit: Payer: Commercial Managed Care - HMO | Admitting: Podiatry

## 2015-04-13 ENCOUNTER — Other Ambulatory Visit: Payer: Self-pay | Admitting: Urology

## 2015-04-14 ENCOUNTER — Ambulatory Visit: Payer: Commercial Managed Care - HMO | Admitting: Endocrinology

## 2015-04-14 LAB — URINE CULTURE: SPECIAL REQUESTS: NORMAL

## 2015-04-22 ENCOUNTER — Encounter: Payer: Self-pay | Admitting: Endocrinology

## 2015-04-22 ENCOUNTER — Ambulatory Visit (INDEPENDENT_AMBULATORY_CARE_PROVIDER_SITE_OTHER): Payer: Commercial Managed Care - HMO | Admitting: Endocrinology

## 2015-04-22 VITALS — BP 137/88 | HR 77 | Temp 97.5°F | Ht 69.0 in | Wt 185.0 lb

## 2015-04-22 DIAGNOSIS — N181 Chronic kidney disease, stage 1: Secondary | ICD-10-CM | POA: Diagnosis not present

## 2015-04-22 DIAGNOSIS — Z794 Long term (current) use of insulin: Secondary | ICD-10-CM | POA: Diagnosis not present

## 2015-04-22 DIAGNOSIS — E1122 Type 2 diabetes mellitus with diabetic chronic kidney disease: Secondary | ICD-10-CM

## 2015-04-22 LAB — POCT GLYCOSYLATED HEMOGLOBIN (HGB A1C): Hemoglobin A1C: 8.6

## 2015-04-22 MED ORDER — INSULIN REGULAR HUMAN 100 UNIT/ML IJ SOLN: INTRAMUSCULAR | Status: DC
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
## 2015-04-22 NOTE — Patient Instructions (Addendum)
check your blood sugar twice a day.  vary the time of day when you check, between before the 3 meals, and at bedtime.  also check if you have symptoms of your blood sugar being too high or too low.  please keep a record of the readings and bring it to your next appointment here (or you can bring the meter itself).  You can write it on any piece of paper.  please call us sooner if your blood sugar goes below 70, or if you have a lot of readings over 200.   Please change your insulin to 3 times a day (just before each meal), 15-10-25 units. Please come back for a follow-up appointment in 2 months.

## 2015-05-12 ENCOUNTER — Encounter (HOSPITAL_BASED_OUTPATIENT_CLINIC_OR_DEPARTMENT_OTHER): Admission: RE | Payer: Self-pay | Source: Ambulatory Visit

## 2015-05-12 ENCOUNTER — Ambulatory Visit (HOSPITAL_BASED_OUTPATIENT_CLINIC_OR_DEPARTMENT_OTHER): Admission: RE | Admit: 2015-05-12 | Payer: Commercial Managed Care - HMO | Source: Ambulatory Visit | Admitting: Urology

## 2015-05-12 SURGERY — CIRCUMCISION, ADULT
Anesthesia: General

## 2015-05-18 ENCOUNTER — Encounter: Payer: Self-pay | Admitting: Family Medicine

## 2015-05-29 ENCOUNTER — Other Ambulatory Visit: Payer: Self-pay | Admitting: Family Medicine

## 2015-06-22 ENCOUNTER — Encounter: Payer: Self-pay | Admitting: Endocrinology

## 2015-06-22 ENCOUNTER — Ambulatory Visit (INDEPENDENT_AMBULATORY_CARE_PROVIDER_SITE_OTHER): Payer: Commercial Managed Care - HMO | Admitting: Endocrinology

## 2015-06-22 VITALS — BP 136/78 | HR 87 | Temp 97.8°F | Ht 69.0 in | Wt 176.0 lb

## 2015-06-22 DIAGNOSIS — N181 Chronic kidney disease, stage 1: Secondary | ICD-10-CM

## 2015-06-22 DIAGNOSIS — Z794 Long term (current) use of insulin: Secondary | ICD-10-CM | POA: Diagnosis not present

## 2015-06-22 DIAGNOSIS — E1122 Type 2 diabetes mellitus with diabetic chronic kidney disease: Secondary | ICD-10-CM

## 2015-06-22 LAB — POCT GLYCOSYLATED HEMOGLOBIN (HGB A1C): HEMOGLOBIN A1C: 9.1

## 2015-06-22 MED ORDER — INSULIN NPH ISOPHANE & REGULAR (70-30) 100 UNIT/ML ~~LOC~~ SUSP: 40.0000 [IU] | Freq: Every day | SUBCUTANEOUS | Status: DC

## 2015-06-22 NOTE — Patient Instructions (Addendum)
check your blood sugar twice a day.  vary the time of day when you check, between before the 3 meals, and at bedtime.  also check if you have symptoms of your blood sugar being too high or too low.  please keep a record of the readings and bring it to your next appointment here (or you can bring the meter itself).  You can write it on any piece of paper.  please call us sooner if your blood sugar goes below 70, or if you have a lot of readings over 200.   Please change your insulin back to 70/30, 40 units with breakfast.  i have sent a prescription to your pharmacy.   On this type of insulin schedule, you should eat meals on a regular schedule.  If a meal is missed or significantly delayed, your blood sugar could go low. Please come back for a follow-up appointment in 2 weeks.
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
## 2015-07-02 ENCOUNTER — Ambulatory Visit: Payer: Commercial Managed Care - HMO | Admitting: Family Medicine

## 2015-07-06 ENCOUNTER — Ambulatory Visit: Payer: Commercial Managed Care - HMO | Admitting: Endocrinology

## 2015-07-15 ENCOUNTER — Ambulatory Visit: Payer: Commercial Managed Care - HMO | Admitting: Family Medicine

## 2015-07-15 ENCOUNTER — Telehealth: Payer: Self-pay | Admitting: Family Medicine

## 2015-07-15 NOTE — Telephone Encounter (Signed)
No charge. 

## 2015-07-15 NOTE — Telephone Encounter (Signed)
Patient's wife called @ 10:45 stating that they are having Septic Tank issues and can not make the 11:15 appt. CHARGE or NO CHARGE

## 2015-08-26 ENCOUNTER — Ambulatory Visit (INDEPENDENT_AMBULATORY_CARE_PROVIDER_SITE_OTHER): Payer: Commercial Managed Care - HMO | Admitting: Family Medicine

## 2015-08-26 ENCOUNTER — Encounter: Payer: Self-pay | Admitting: Family Medicine

## 2015-08-26 VITALS — BP 132/80 | HR 73 | Temp 98.0°F | Ht 69.0 in | Wt 181.4 lb

## 2015-08-26 DIAGNOSIS — N289 Disorder of kidney and ureter, unspecified: Secondary | ICD-10-CM | POA: Diagnosis not present

## 2015-08-26 DIAGNOSIS — R3911 Hesitancy of micturition: Secondary | ICD-10-CM

## 2015-08-26 DIAGNOSIS — E1122 Type 2 diabetes mellitus with diabetic chronic kidney disease: Secondary | ICD-10-CM | POA: Diagnosis not present

## 2015-08-26 DIAGNOSIS — I10 Essential (primary) hypertension: Secondary | ICD-10-CM | POA: Diagnosis not present

## 2015-08-26 DIAGNOSIS — E782 Mixed hyperlipidemia: Secondary | ICD-10-CM | POA: Diagnosis not present

## 2015-08-26 DIAGNOSIS — Z794 Long term (current) use of insulin: Secondary | ICD-10-CM

## 2015-08-26 DIAGNOSIS — N181 Chronic kidney disease, stage 1: Secondary | ICD-10-CM

## 2015-08-26 HISTORY — DX: Hesitancy of micturition: R39.11

## 2015-08-26 LAB — LIPID PANEL
CHOL/HDL RATIO: 5
Cholesterol: 160 mg/dL (ref 0–200)
HDL: 31.7 mg/dL — AB (ref >39.00)
LDL Cholesterol: 101 mg/dL — ABNORMAL HIGH (ref 0–99)
NonHDL: 127.93
TRIGLYCERIDES: 135 mg/dL (ref 0.0–149.0)
VLDL: 27 mg/dL (ref 0.0–40.0)

## 2015-08-26 LAB — COMPREHENSIVE METABOLIC PANEL
ALT: 13 U/L (ref 0–53)
AST: 17 U/L (ref 0–37)
Albumin: 3.7 g/dL (ref 3.5–5.2)
Alkaline Phosphatase: 52 U/L (ref 39–117)
BILIRUBIN TOTAL: 0.6 mg/dL (ref 0.2–1.2)
BUN: 33 mg/dL — ABNORMAL HIGH (ref 6–23)
CALCIUM: 9.2 mg/dL (ref 8.4–10.5)
CHLORIDE: 108 meq/L (ref 96–112)
CO2: 24 meq/L (ref 19–32)
Creatinine, Ser: 1.54 mg/dL — ABNORMAL HIGH (ref 0.40–1.50)
GFR: 57.33 mL/min — AB (ref >60.00)
GLUCOSE: 256 mg/dL — AB (ref 70–99)
POTASSIUM: 4.2 meq/L (ref 3.5–5.1)
Sodium: 139 mEq/L (ref 135–145)
Total Protein: 7.6 g/dL (ref 6.0–8.3)

## 2015-08-26 LAB — TSH: TSH: 1.82 u[IU]/mL (ref 0.35–4.50)

## 2015-08-26 LAB — CBC
HEMATOCRIT: 34.9 % — AB (ref 39.0–52.0)
HEMOGLOBIN: 11.6 g/dL — AB (ref 13.0–17.0)
MCHC: 33.4 g/dL (ref 30.0–36.0)
MCV: 83.9 fl (ref 78.0–100.0)
PLATELETS: 214 10*3/uL (ref 150.0–400.0)
RBC: 4.16 Mil/uL — ABNORMAL LOW (ref 4.22–5.81)
RDW: 13.9 % (ref 11.5–15.5)
WBC: 7.4 10*3/uL (ref 4.0–10.5)

## 2015-08-26 NOTE — Assessment & Plan Note (Addendum)
Unacceptable at 9.1 but with the adjustments made by endocrinology he is seeing sugars frequently around 115-125. Highest has been 200 only once  minimize simple carbs. Increase exercise as tolerated. Continue current meds for now. He is eating better more often now but not always. Has been pleased with the return to the 70/30.  Lowest has been 66 and he felt poorly but it has not occurred

## 2015-08-26 NOTE — Assessment & Plan Note (Signed)
Well controlled, no changes to meds. Encouraged heart healthy diet such as the DASH diet and exercise as tolerated.  °

## 2015-08-26 NOTE — Assessment & Plan Note (Signed)
Sudden onset, was seen in ER and sent to [facility], given a trial of likely Tamulosin and resolved. No longer taking and feels well

## 2015-08-26 NOTE — Assessment & Plan Note (Signed)
Stay well hydrated, check CMP today

## 2015-08-26 NOTE — Assessment & Plan Note (Signed)
Encouraged heart healthy diet, increase exercise, avoid trans fats, consider a krill oil cap daily 

## 2015-08-26 NOTE — Progress Notes (Signed)
Pre visit review using our clinic review tool, if applicable. No additional management support is needed unless otherwise documented below in the visit note. 
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
## 2015-08-26 NOTE — Patient Instructions (Signed)
[employer] company has a 10 strain at [email or web address] or at [place] for Diabetes Mellitus Carbohydrate counting is a method for keeping track of the amount of carbohydrates you eat. Eating carbohydrates naturally increases the level of sugar (glucose) in your blood, so it is important for you to know the amount that is okay for you to have in every meal. Carbohydrate counting helps keep the level of glucose in your blood within normal limits. The amount of carbohydrates allowed is different for every person. A dietitian can help you calculate the amount that is right for you. Once you know the amount of carbohydrates you can have, you can count the carbohydrates in the foods you want to eat. Carbohydrates are found in the following foods:  Grains, such as breads and cereals.  Dried beans and soy products.  Starchy vegetables, such as potatoes, peas, and corn.  Fruit and fruit juices.  Milk and yogurt.  Sweets and snack foods, such as cake, cookies, candy, chips, soft drinks, and fruit drinks. CARBOHYDRATE COUNTING There are two ways to count the carbohydrates in your food. You can use either of the methods or a combination of both. Reading the "Nutrition Facts" on [place] The "Nutrition Facts" is an area that is included on the labels of almost all packaged food and beverages in the [place]. It includes the serving size of that food or beverage and information about the nutrients in each serving of the food, including the grams (g) of carbohydrate per serving.  Decide the number of servings of this food or beverage that you will be able to eat or drink. Multiply that number of servings by the number of grams of carbohydrate that is listed on the label for that serving. The total will be the amount of carbohydrates you will be having when you eat or drink this food or beverage. Learning Standard Serving Sizes of Food When you eat food  that is not packaged or does not include "Nutrition Facts" on the label, you need to measure the servings in order to count the amount of carbohydrates.A serving of most carbohydrate-rich foods contains about 15 g of carbohydrates. The following list includes serving sizes of carbohydrate-rich foods that provide 15 g ofcarbohydrate per serving:   1 slice of bread (1 oz) or 1 six-inch tortilla.    of a hamburger bun or English muffin.  4-6 crackers.   cup unsweetened dry cereal.    cup hot cereal.   cup rice or pasta.    cup mashed potatoes or  of a large baked potato.  1 cup fresh fruit or one small piece of fruit.    cup canned or frozen fruit or fruit juice.  1 cup milk.   cup plain fat-free yogurt or yogurt sweetened with artificial sweeteners.   cup cooked dried beans or starchy vegetable, such as peas, corn, or potatoes.  Decide the number of standard-size servings that you will eat. Multiply that number of servings by 15 (the grams of carbohydrates in that serving). For example, if you eat 2 cups of strawberries, you will have eaten 2 servings and 30 g of carbohydrates (2 servings x 15 g = 30 g). For foods such as soups and casseroles, in which more than one food is mixed in, you will need to count the carbohydrates in each food that is included. EXAMPLE OF CARBOHYDRATE COUNTING Sample Dinner  3 oz chicken breast.   cup of brown rice.  cup of corn.  1 cup milk.   1 cup strawberries with sugar-free whipped topping.  Carbohydrate Calculation Step 1: Identify the foods that contain carbohydrates:   Rice.   Corn.   Milk.   Strawberries. Step 2:Calculate the number of servings eaten of each:   2 servings of rice.   1 serving of corn.   1 serving of milk.   1 serving of strawberries. Step 3: Multiply each of those number of servings by 15 g:   2 servings of rice x 15 g = 30 g.   1 serving of corn x 15 g = 15 g.   1 serving  of milk x 15 g = 15 g.   1 serving of strawberries x 15 g = 15 g. Step 4: Add together all of the amounts to find the total grams of carbohydrates eaten: 30 g + 15 g + 15 g + 15 g = 75 g.   This information is not intended to replace advice given to you by your health care provider. Make sure you discuss any questions you have with your health care provider.   Document Released: 05/22/2005 Document Revised: 06/12/2014 Document Reviewed: 04/18/2013 Elsevier Interactive Patient Education [employer].

## 2015-11-26 ENCOUNTER — Ambulatory Visit: Payer: Commercial Managed Care - HMO | Admitting: Family Medicine

## 2015-11-30 ENCOUNTER — Telehealth: Payer: Self-pay | Admitting: Family Medicine

## 2015-11-30 NOTE — Telephone Encounter (Signed)
Patient was No Show on 6/23. Last No Show was 07/25/15. Charge or No Charge?

## 2015-11-30 NOTE — Telephone Encounter (Signed)
No charge. 

## 2015-12-03 ENCOUNTER — Encounter: Payer: Self-pay | Admitting: Family Medicine

## 2016-01-25 ENCOUNTER — Encounter: Payer: Self-pay | Admitting: Endocrinology

## 2016-01-25 ENCOUNTER — Ambulatory Visit (INDEPENDENT_AMBULATORY_CARE_PROVIDER_SITE_OTHER): Payer: Commercial Managed Care - HMO | Admitting: Endocrinology

## 2016-01-25 VITALS — BP 124/60 | HR 78 | Ht 69.0 in | Wt 173.0 lb

## 2016-01-25 DIAGNOSIS — E0800 Diabetes mellitus due to underlying condition with hyperosmolarity without nonketotic hyperglycemic-hyperosmolar coma (NKHHC): Secondary | ICD-10-CM | POA: Diagnosis not present

## 2016-01-25 DIAGNOSIS — E1129 Type 2 diabetes mellitus with other diabetic kidney complication: Secondary | ICD-10-CM | POA: Diagnosis not present

## 2016-01-25 DIAGNOSIS — E11319 Type 2 diabetes mellitus with unspecified diabetic retinopathy without macular edema: Secondary | ICD-10-CM

## 2016-01-25 DIAGNOSIS — E1142 Type 2 diabetes mellitus with diabetic polyneuropathy: Secondary | ICD-10-CM

## 2016-01-25 DIAGNOSIS — Z794 Long term (current) use of insulin: Secondary | ICD-10-CM

## 2016-01-25 LAB — POCT GLYCOSYLATED HEMOGLOBIN (HGB A1C): Hemoglobin A1C: 8.1

## 2016-01-25 MED ORDER — INSULIN NPH ISOPHANE & REGULAR (70-30) 100 UNIT/ML ~~LOC~~ SUSP: 45.0000 [IU] | Freq: Every day | SUBCUTANEOUS | 11 refills | Status: DC

## 2016-01-25 NOTE — Patient Instructions (Addendum)
check your blood sugar twice a day.  vary the time of day when you check, between before the 3 meals, and at bedtime.  also check if you have symptoms of your blood sugar being too high or too low.  please keep a record of the readings and bring it to your next appointment here (or you can bring the meter itself).  You can write it on any piece of paper.  please call us sooner if your blood sugar goes below 70, or if you have a lot of readings over 200.   Please increase the 70/30 to 45 units with breakfast.  i have sent a new prescription to your pharmacy.   On this type of insulin schedule, you should eat meals on a regular schedule.  If a meal is missed or significantly delayed, your blood sugar could go low.  Please come back for a follow-up appointment in 3 months.
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
## 2016-01-27 ENCOUNTER — Other Ambulatory Visit: Payer: Self-pay | Admitting: Family Medicine

## 2016-01-27 DIAGNOSIS — I10 Essential (primary) hypertension: Secondary | ICD-10-CM

## 2016-01-27 MED ORDER — ACCU-CHEK AVIVA PLUS W/DEVICE KIT: PACK | 0 refills | Status: DC

## 2016-01-27 MED ORDER — INSULIN NPH ISOPHANE & REGULAR (70-30) 100 UNIT/ML ~~LOC~~ SUSP: 45.0000 [IU] | Freq: Every day | SUBCUTANEOUS | 0 refills | Status: DC

## 2016-01-27 MED ORDER — LISINOPRIL-HYDROCHLOROTHIAZIDE 20-12.5 MG PO TABS: 1.0000 | ORAL_TABLET | Freq: Two times a day (BID) | ORAL | 0 refills | Status: DC

## 2016-01-27 MED ORDER — AMLODIPINE BESYLATE 10 MG PO TABS: 10.0000 mg | ORAL_TABLET | Freq: Every day | ORAL | 1 refills | Status: DC

## 2016-01-27 MED ORDER — ATORVASTATIN CALCIUM 80 MG PO TABS: 80.0000 mg | ORAL_TABLET | Freq: Every day | ORAL | 1 refills | Status: DC

## 2016-01-27 MED ORDER — CARVEDILOL 25 MG PO TABS: 25.0000 mg | ORAL_TABLET | Freq: Two times a day (BID) | ORAL | 1 refills | Status: DC

## 2016-01-31 ENCOUNTER — Other Ambulatory Visit: Payer: Self-pay | Admitting: Family Medicine

## 2016-01-31 MED ORDER — ACCU-CHEK SOFTCLIX LANCETS MISC: 1 refills | Status: DC

## 2016-01-31 MED ORDER — BD SWAB SINGLE USE REGULAR PADS: MEDICATED_PAD | 6 refills | Status: DC

## 2016-01-31 MED ORDER — GLUCOSE BLOOD VI STRP: ORAL_STRIP | 2 refills | Status: DC

## 2016-02-15 ENCOUNTER — Ambulatory Visit (INDEPENDENT_AMBULATORY_CARE_PROVIDER_SITE_OTHER): Payer: Commercial Managed Care - HMO | Admitting: Family Medicine

## 2016-02-15 ENCOUNTER — Encounter: Payer: Self-pay | Admitting: Family Medicine

## 2016-02-15 VITALS — BP 142/74 | HR 79 | Temp 98.2°F | Ht 69.0 in | Wt 173.1 lb

## 2016-02-15 DIAGNOSIS — E119 Type 2 diabetes mellitus without complications: Secondary | ICD-10-CM | POA: Diagnosis not present

## 2016-02-15 DIAGNOSIS — E0800 Diabetes mellitus due to underlying condition with hyperosmolarity without nonketotic hyperglycemic-hyperosmolar coma (NKHHC): Secondary | ICD-10-CM

## 2016-02-15 DIAGNOSIS — I10 Essential (primary) hypertension: Secondary | ICD-10-CM

## 2016-02-15 DIAGNOSIS — N289 Disorder of kidney and ureter, unspecified: Secondary | ICD-10-CM

## 2016-02-15 DIAGNOSIS — D649 Anemia, unspecified: Secondary | ICD-10-CM

## 2016-02-15 DIAGNOSIS — E782 Mixed hyperlipidemia: Secondary | ICD-10-CM

## 2016-02-15 NOTE — Patient Instructions (Signed)
Encouraged increased rest and hydration, add probiotics, zinc such as Coldeze or Xicam. Treat fevers as needed, Mucinex twice daily and vitamin C  Consider Zyrtec or Claritin Hypertension Hypertension, commonly called high blood pressure, is when the force of blood pumping through your arteries is too strong. Your arteries are the blood vessels that carry blood from your heart throughout your body. A blood pressure reading consists of a higher number over a lower number, such as 110/72. The higher number (systolic) is the pressure inside your arteries when your heart pumps. The lower number (diastolic) is the pressure inside your arteries when your heart relaxes. Ideally you want your blood pressure below 120/80. Hypertension forces your heart to work harder to pump blood. Your arteries may become narrow or stiff. Having untreated or uncontrolled hypertension can cause heart attack, stroke, kidney disease, and other problems. RISK FACTORS Some risk factors for high blood pressure are controllable. Others are not.  Risk factors you cannot control include:   Race. You may be at higher risk if you are African American.  Age. Risk increases with age.  Gender. Men are at higher risk than women before age 76 years. After age 35, women are at higher risk than men. Risk factors you can control include:  Not getting enough exercise or physical activity.  Being overweight.  Getting too much fat, sugar, calories, or salt in your diet.  Drinking too much alcohol. SIGNS AND SYMPTOMS Hypertension does not usually cause signs or symptoms. Extremely high blood pressure (hypertensive crisis) may cause headache, anxiety, shortness of breath, and nosebleed. DIAGNOSIS To check if you have hypertension, your health care provider will measure your blood pressure while you are seated, with your arm held at the level of your heart. It should be measured at least twice using the same arm. Certain conditions can  cause a difference in blood pressure between your right and left arms. A blood pressure reading that is higher than normal on one occasion does not mean that you need treatment. If it is not clear whether you have high blood pressure, you may be asked to return on a different day to have your blood pressure checked again. Or, you may be asked to monitor your blood pressure at home for 1 or more weeks. TREATMENT Treating high blood pressure includes making lifestyle changes and possibly taking medicine. Living a healthy lifestyle can help lower high blood pressure. You may need to change some of your habits. Lifestyle changes may include:  Following the DASH diet. This diet is high in fruits, vegetables, and whole grains. It is low in salt, red meat, and added sugars.  Keep your sodium intake below 2,300 mg per day.  Getting at least 30-45 minutes of aerobic exercise at least 4 times per week.  Losing weight if necessary.  Not smoking.  Limiting alcoholic beverages.  Learning ways to reduce stress. Your health care provider may prescribe medicine if lifestyle changes are not enough to get your blood pressure under control, and if one of the following is true:  You are 41-49 years of age and your systolic blood pressure is above 140.  You are 1 years of age or older, and your systolic blood pressure is above 150.  Your diastolic blood pressure is above 90.  You have diabetes, and your systolic blood pressure is over 962 or your diastolic blood pressure is over 90.  You have kidney disease and your blood pressure is above 140/90.  You have heart  disease and your blood pressure is above 140/90. Your personal target blood pressure may vary depending on your medical conditions, your age, and other factors. HOME CARE INSTRUCTIONS  Have your blood pressure rechecked as directed by your health care provider.   Take medicines only as directed by your health care provider. Follow the  directions carefully. Blood pressure medicines must be taken as prescribed. The medicine does not work as well when you skip doses. Skipping doses also puts you at risk for problems.  Do not smoke.   Monitor your blood pressure at home as directed by your health care provider. SEEK MEDICAL CARE IF:   You think you are having a reaction to medicines taken.  You have recurrent headaches or feel dizzy.  You have swelling in your ankles.  You have trouble with your vision. SEEK IMMEDIATE MEDICAL CARE IF:  You develop a severe headache or confusion.  You have unusual weakness, numbness, or feel faint.  You have severe chest or abdominal pain.  You vomit repeatedly.  You have trouble breathing. MAKE SURE YOU:   Understand these instructions.  Will watch your condition.  Will get help right away if you are not doing well or get worse.   This information is not intended to replace advice given to you by your health care provider. Make sure you discuss any questions you have with your health care provider.   Document Released: 05/22/2005 Document Revised: 10/06/2014 Document Reviewed: 03/14/2013 Elsevier Interactive Patient Education 2016 [place] 10 strain caps probiotic, 1 cap daily [employer] or [place]

## 2016-02-15 NOTE — Progress Notes (Signed)
Pre visit review using our clinic review tool, if applicable. No additional management support is needed unless otherwise documented below in the visit note. 

## 2016-02-20 NOTE — Assessment & Plan Note (Signed)
Mild, stable, increase hydration and minimize OTC meds.

## 2016-02-20 NOTE — Assessment & Plan Note (Signed)
Increase leafy greens, consider increased lean red meat and using cast iron cookware. Continue to monitor, report any concerns 
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
## 2016-02-20 NOTE — Assessment & Plan Note (Signed)
Tolerating statin, encouraged heart healthy diet, avoid trans fats, minimize simple carbs and saturated fats. Increase exercise as tolerated 

## 2016-02-20 NOTE — Assessment & Plan Note (Signed)
Well controlled, no changes to meds. Encouraged heart healthy diet such as the DASH diet and exercise as tolerated.  °

## 2016-03-01 ENCOUNTER — Other Ambulatory Visit: Payer: Self-pay | Admitting: Family Medicine

## 2016-03-01 DIAGNOSIS — G609 Hereditary and idiopathic neuropathy, unspecified: Secondary | ICD-10-CM

## 2016-03-01 DIAGNOSIS — E119 Type 2 diabetes mellitus without complications: Secondary | ICD-10-CM

## 2016-03-01 DIAGNOSIS — I10 Essential (primary) hypertension: Secondary | ICD-10-CM

## 2016-03-01 NOTE — Telephone Encounter (Signed)
Rx request to pharmacy/[facility]  

## 2016-04-10 ENCOUNTER — Other Ambulatory Visit: Payer: Commercial Managed Care - HMO

## 2016-04-11 ENCOUNTER — Other Ambulatory Visit (INDEPENDENT_AMBULATORY_CARE_PROVIDER_SITE_OTHER): Payer: Commercial Managed Care - HMO

## 2016-04-11 DIAGNOSIS — E782 Mixed hyperlipidemia: Secondary | ICD-10-CM | POA: Diagnosis not present

## 2016-04-11 DIAGNOSIS — I10 Essential (primary) hypertension: Secondary | ICD-10-CM | POA: Diagnosis not present

## 2016-04-11 LAB — CBC
HEMATOCRIT: 35.7 % — AB (ref 39.0–52.0)
HEMOGLOBIN: 11.9 g/dL — AB (ref 13.0–17.0)
MCHC: 33.3 g/dL (ref 30.0–36.0)
MCV: 82.8 fl (ref 78.0–100.0)
PLATELETS: 227 10*3/uL (ref 150.0–400.0)
RBC: 4.31 Mil/uL (ref 4.22–5.81)
RDW: 14.5 % (ref 11.5–15.5)
WBC: 6.5 10*3/uL (ref 4.0–10.5)

## 2016-04-11 LAB — LIPID PANEL
CHOL/HDL RATIO: 4
Cholesterol: 151 mg/dL (ref 0–200)
HDL: 33.8 mg/dL — AB (ref >39.00)
LDL CALC: 92 mg/dL (ref 0–99)
NONHDL: 116.92
Triglycerides: 123 mg/dL (ref 0.0–149.0)
VLDL: 24.6 mg/dL (ref 0.0–40.0)

## 2016-04-11 LAB — COMPREHENSIVE METABOLIC PANEL
ALK PHOS: 49 U/L (ref 39–117)
ALT: 17 U/L (ref 0–53)
AST: 20 U/L (ref 0–37)
Albumin: 3.7 g/dL (ref 3.5–5.2)
BUN: 34 mg/dL — ABNORMAL HIGH (ref 6–23)
CHLORIDE: 109 meq/L (ref 96–112)
CO2: 24 mEq/L (ref 19–32)
Calcium: 9.4 mg/dL (ref 8.4–10.5)
Creatinine, Ser: 1.61 mg/dL — ABNORMAL HIGH (ref 0.40–1.50)
GFR: 54.36 mL/min — AB (ref >60.00)
GLUCOSE: 187 mg/dL — AB (ref 70–99)
POTASSIUM: 4.3 meq/L (ref 3.5–5.1)
Sodium: 140 mEq/L (ref 135–145)
TOTAL PROTEIN: 7.6 g/dL (ref 6.0–8.3)
Total Bilirubin: 0.6 mg/dL (ref 0.2–1.2)

## 2016-04-11 LAB — TSH: TSH: 1.69 u[IU]/mL (ref 0.35–4.50)

## 2016-04-17 ENCOUNTER — Telehealth: Payer: Self-pay | Admitting: Family Medicine

## 2016-04-17 ENCOUNTER — Ambulatory Visit: Payer: Commercial Managed Care - HMO | Admitting: Family Medicine

## 2016-04-17 NOTE — Telephone Encounter (Signed)
Caller name:Mrs Mees Relationship to patient:spouse Can be reached: Pharmacy:  Reason for call:Unable to make appointment today, due to scheduling errors. Did rescheduled appt for 04/24/16, NOS charge?

## 2016-04-17 NOTE — Telephone Encounter (Signed)
No chargeje

## 2016-04-23 NOTE — Progress Notes (Signed)
Subjective:     Patient ID: Jermaine Mitchell, male   DOB: 18-Jan-1941, 75 y.o.   MRN: [id number]  HPI   Review of Systems     Objective:   Physical Exam     Assessment:         Plan:
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
## 2016-04-24 ENCOUNTER — Ambulatory Visit (INDEPENDENT_AMBULATORY_CARE_PROVIDER_SITE_OTHER): Payer: Commercial Managed Care - HMO | Admitting: Family Medicine

## 2016-04-24 ENCOUNTER — Encounter: Payer: Self-pay | Admitting: Family Medicine

## 2016-04-24 VITALS — BP 132/72 | HR 68 | Temp 97.6°F | Ht 69.0 in | Wt 177.4 lb

## 2016-04-24 DIAGNOSIS — R350 Frequency of micturition: Secondary | ICD-10-CM | POA: Diagnosis not present

## 2016-04-24 DIAGNOSIS — N183 Chronic kidney disease, stage 3 unspecified: Secondary | ICD-10-CM

## 2016-04-24 DIAGNOSIS — Z794 Long term (current) use of insulin: Secondary | ICD-10-CM

## 2016-04-24 DIAGNOSIS — E782 Mixed hyperlipidemia: Secondary | ICD-10-CM

## 2016-04-24 DIAGNOSIS — E1122 Type 2 diabetes mellitus with diabetic chronic kidney disease: Secondary | ICD-10-CM

## 2016-04-24 DIAGNOSIS — I10 Essential (primary) hypertension: Secondary | ICD-10-CM | POA: Diagnosis not present

## 2016-04-24 DIAGNOSIS — N289 Disorder of kidney and ureter, unspecified: Secondary | ICD-10-CM

## 2016-04-24 NOTE — Progress Notes (Signed)
Pre visit review using our clinic review tool, if applicable. No additional management support is needed unless otherwise documented below in the visit note. 

## 2016-04-24 NOTE — Patient Instructions (Addendum)
Mucinex twice daily x 10 days ClaritinZyrtec 10 mg daily and Flonase in nose daily Hydrate well need roughly 64 oz of clear  Vitamin C and Zinc, Coldeeze or Xicam  Carbohydrate Counting for Diabetes Mellitus, Adult Carbohydrate counting is a method for keeping track of how many carbohydrates you eat. Eating carbohydrates naturally increases the amount of sugar (glucose) in the blood. Counting how many carbohydrates you eat helps keep your blood glucose within normal limits, which helps you manage your diabetes (diabetes mellitus). It is important to know how many carbohydrates you can safely have in each meal. This is different for every person. A diet and nutrition specialist (registered dietitian) can help you make a meal plan and calculate how many carbohydrates you should have at each meal and snack. Carbohydrates are found in the following foods:  Grains, such as breads and cereals.  Dried beans and soy products.  Starchy vegetables, such as potatoes, peas, and corn.  Fruit and fruit juices.  Milk and yogurt.  Sweets and snack foods, such as cake, cookies, candy, chips, and soft drinks. How do I count carbohydrates? There are two ways to count carbohydrates in food. You can use either of the methods or a combination of both. Reading "Nutrition Facts" on packaged food  The "Nutrition Facts" list is included on the labels of almost all packaged foods and beverages in the [place] It includes:  The serving size.  Information about nutrients in each serving, including the grams (g) of carbohydrate per serving. To use the "Nutrition Facts":  Decide how many servings you will have.  Multiply the number of servings by the number of carbohydrates per serving.  The resulting number is the total amount of carbohydrates that you will be having. Learning standard serving sizes of other foods  When you eat foods containing carbohydrates that are not packaged or do not include "Nutrition  Facts" on the label, you need to measure the servings in order to count the amount of carbohydrates:  Measure the foods that you will eat with a food scale or measuring cup, if needed.  Decide how many standard-size servings you will eat.  Multiply the number of servings by 15. Most carbohydrate-rich foods have about 15 g of carbohydrates per serving.  For example, if you eat 8 oz (170 g) of strawberries, you will have eaten 2 servings and 30 g of carbohydrates (2 servings x 15 g = 30 g).  For foods that have more than one food mixed, such as soups and casseroles, you must count the carbohydrates in each food that is included. The following list contains standard serving sizes of common carbohydrate-rich foods. Each of these servings has about 15 g of carbohydrates:   hamburger bun or  English muffin.   oz (15 mL) syrup.   oz (14 g) jelly.  1 slice of bread.  1 six-inch tortilla.  3 oz (85 g) cooked rice or pasta.  4 oz (113 g) cooked dried beans.  4 oz (113 g) starchy vegetable, such as peas, corn, or potatoes.  4 oz (113 g) hot cereal.  4 oz (113 g) mashed potatoes or  of a large baked potato.  4 oz (113 g) canned or frozen fruit.  4 oz (120 mL) fruit juice.  4-6 crackers.  6 chicken nuggets.  6 oz (170 g) unsweetened dry cereal.  6 oz (170 g) plain fat-free yogurt or yogurt sweetened with artificial sweeteners.  8 oz (240 mL) milk.  8 oz (170  g) fresh fruit or one small piece of fruit.  24 oz (680 g) popped popcorn. Example of carbohydrate counting Sample meal  3 oz (85 g) chicken breast.  6 oz (170 g) brown rice.  4 oz (113 g) corn.  8 oz (240 mL) milk.  8 oz (170 g) strawberries with sugar-free whipped topping. Carbohydrate calculation 1. Identify the foods that contain carbohydrates:  Rice.  Corn.  Milk.  Strawberries. 2. Calculate how many servings you have of each food:  2 servings rice.  1 serving corn.  1 serving milk.  1  serving strawberries. 3. Multiply each number of servings by 15 g:  2 servings rice x 15 g = 30 g.  1 serving corn x 15 g = 15 g.  1 serving milk x 15 g = 15 g.  1 serving strawberries x 15 g = 15 g. 4. Add together all of the amounts to find the total grams of carbohydrates eaten:  30 g + 15 g + 15 g + 15 g = 75 g of carbohydrates total. This information is not intended to replace advice given to you by your health care provider. Make sure you discuss any questions you have with your health care provider. Document Released: 05/22/2005 Document Revised: 12/10/2015 Document Reviewed: 11/03/2015 Elsevier Interactive Patient Education  2017 [employer].

## 2016-04-25 ENCOUNTER — Ambulatory Visit (INDEPENDENT_AMBULATORY_CARE_PROVIDER_SITE_OTHER): Payer: Commercial Managed Care - HMO | Admitting: Endocrinology

## 2016-04-25 ENCOUNTER — Encounter: Payer: Self-pay | Admitting: Endocrinology

## 2016-04-25 VITALS — BP 122/68 | HR 74 | Wt 176.0 lb

## 2016-04-25 DIAGNOSIS — Z794 Long term (current) use of insulin: Secondary | ICD-10-CM | POA: Diagnosis not present

## 2016-04-25 DIAGNOSIS — E1122 Type 2 diabetes mellitus with diabetic chronic kidney disease: Secondary | ICD-10-CM | POA: Diagnosis not present

## 2016-04-25 DIAGNOSIS — N183 Chronic kidney disease, stage 3 unspecified: Secondary | ICD-10-CM

## 2016-04-25 LAB — POCT GLYCOSYLATED HEMOGLOBIN (HGB A1C): HEMOGLOBIN A1C: 8.4

## 2016-04-25 NOTE — Patient Instructions (Addendum)
check your blood sugar twice a day.  vary the time of day when you check, between before the 3 meals, and at bedtime.  also check if you have symptoms of your blood sugar being too high or too low.  please keep a record of the readings and bring it to your next appointment here (or you can bring the meter itself).  You can write it on any piece of paper.  please call us sooner if your blood sugar goes below 70, or if you have a lot of readings over 200.   Please continue the 70/30 insulin, 45 units with breakfast.  Please take this, no matter what your blood sugar is.   On this type of insulin schedule, you should eat meals on a regular schedule.  If a meal is missed or significantly delayed, your blood sugar could go low.  Please come back for a follow-up appointment in 3 months.

## 2016-04-26 ENCOUNTER — Ambulatory Visit: Payer: Commercial Managed Care - HMO | Admitting: Endocrinology

## 2016-05-07 NOTE — Assessment & Plan Note (Signed)
Follows with endocrinology and is feeling well, has an appt there this week.

## 2016-05-07 NOTE — Assessment & Plan Note (Signed)
Tolerating statin, encouraged heart healthy diet, avoid trans fats, minimize simple carbs and saturated fats. Increase exercise as tolerated 
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
## 2016-05-07 NOTE — Assessment & Plan Note (Signed)
Persistent and will needs monitoring. Maintain adequate hydration and avoid OTC meds.

## 2016-05-07 NOTE — Assessment & Plan Note (Signed)
Well controlled, no changes to meds. Encouraged heart healthy diet such as the DASH diet and exercise as tolerated.  °

## 2016-06-07 ENCOUNTER — Other Ambulatory Visit: Payer: Self-pay | Admitting: Family Medicine

## 2016-06-29 ENCOUNTER — Telehealth: Payer: Self-pay | Admitting: Family Medicine

## 2016-06-29 NOTE — Telephone Encounter (Signed)
Spoke to patient regarding awv. Patient stated that his wife, Jermaine Mitchell, will have to give the office a call back to schedule his appt.

## 2016-07-17 ENCOUNTER — Other Ambulatory Visit: Payer: Self-pay | Admitting: Endocrinology

## 2016-07-25 ENCOUNTER — Ambulatory Visit: Payer: Commercial Managed Care - HMO | Admitting: Family Medicine

## 2016-07-26 ENCOUNTER — Encounter: Payer: Self-pay | Admitting: Endocrinology

## 2016-07-26 ENCOUNTER — Ambulatory Visit (INDEPENDENT_AMBULATORY_CARE_PROVIDER_SITE_OTHER): Payer: Commercial Managed Care - HMO | Admitting: Endocrinology

## 2016-07-26 ENCOUNTER — Encounter: Payer: Commercial Managed Care - HMO | Attending: Endocrinology | Admitting: Nutrition

## 2016-07-26 DIAGNOSIS — Z794 Long term (current) use of insulin: Secondary | ICD-10-CM | POA: Diagnosis not present

## 2016-07-26 DIAGNOSIS — E1122 Type 2 diabetes mellitus with diabetic chronic kidney disease: Secondary | ICD-10-CM | POA: Diagnosis not present

## 2016-07-26 DIAGNOSIS — E119 Type 2 diabetes mellitus without complications: Secondary | ICD-10-CM

## 2016-07-26 DIAGNOSIS — Z713 Dietary counseling and surveillance: Secondary | ICD-10-CM | POA: Insufficient documentation

## 2016-07-26 DIAGNOSIS — N183 Chronic kidney disease, stage 3 (moderate): Secondary | ICD-10-CM

## 2016-07-26 LAB — POCT GLYCOSYLATED HEMOGLOBIN (HGB A1C): Hemoglobin A1C: 7.7

## 2016-07-26 MED ORDER — GLUCOSE BLOOD VI STRP: 1.0000 | ORAL_STRIP | Freq: Two times a day (BID) | 3 refills | Status: AC

## 2016-07-26 MED ORDER — INSULIN NPH ISOPHANE & REGULAR (70-30) 100 UNIT/ML ~~LOC~~ SUSP: SUBCUTANEOUS | 2 refills | Status: DC

## 2016-07-26 NOTE — Patient Instructions (Addendum)
check your blood sugar twice a day.  vary the time of day when you check, between before the 3 meals, and at bedtime.  also check if you have symptoms of your blood sugar being too high or too low.  please keep a record of the readings and bring it to your next appointment here (or you can bring the meter itself).  You can write it on any piece of paper.  please call us sooner if your blood sugar goes below 70, or if you have a lot of readings over 200.   Please continue the 70/30 insulin, 35 units with breakfast, and 5 units with the evening meal.  Please take this, no matter what your blood sugar is.   On this type of insulin schedule, you should eat meals on a regular schedule.  If a meal is missed or significantly delayed, your blood sugar could go low.  Please come back for a follow-up appointment in 3 months.
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
## 2016-07-27 NOTE — Progress Notes (Signed)
Pt. Is here with his wife  He Is injecting Novolin 70/30 insulin via pen twice daily.  He is taking 35u before breakfast and 5u before supper. He reports no difficulty with giving injections.   He is not waiting 30 min. After injecting before eating, and often skips lunch. Typical day: 10AM: up and breakfast, balanced with 15-30 carbs, 6PM: supper with meat 4-6 ounces, 30 carbs.   HS: snack of popcorn, or sugar free jello with fruit.  Denies snacks between meals.  [place] suggested snack at 2PM: suggestions given for this.  He agreed to eat peanut butter or cheese crackers with this.  He has expierence low blood sugar on occasion when active at home in the afternoon, and explained the reason for this.  Other questions were answered about storing the drug.    They had no final questions.

## 2016-07-27 NOTE — Patient Instructions (Signed)
Take 35 u  30 min. Before breakfast, and 5u 30 min. Before supper. Eat a snack every day around 2PM of 6 crackers with cheese or peanut butter

## 2016-08-10 ENCOUNTER — Encounter (HOSPITAL_BASED_OUTPATIENT_CLINIC_OR_DEPARTMENT_OTHER): Payer: Self-pay

## 2016-08-10 ENCOUNTER — Ambulatory Visit (HOSPITAL_BASED_OUTPATIENT_CLINIC_OR_DEPARTMENT_OTHER)
Admission: RE | Admit: 2016-08-10 | Discharge: 2016-08-10 | Disposition: A | Discharge status: Home / Self Care | Payer: Medicare HMO | Source: Ambulatory Visit | Attending: Family Medicine | Admitting: Family Medicine

## 2016-08-10 ENCOUNTER — Ambulatory Visit (INDEPENDENT_AMBULATORY_CARE_PROVIDER_SITE_OTHER): Payer: Medicare HMO | Admitting: Family Medicine

## 2016-08-10 ENCOUNTER — Encounter: Payer: Self-pay | Admitting: Family Medicine

## 2016-08-10 VITALS — BP 138/90 | HR 74 | Temp 98.0°F | Resp 18 | Wt 179.4 lb

## 2016-08-10 DIAGNOSIS — M545 Low back pain, unspecified: Secondary | ICD-10-CM

## 2016-08-10 DIAGNOSIS — I10 Essential (primary) hypertension: Secondary | ICD-10-CM | POA: Diagnosis not present

## 2016-08-10 DIAGNOSIS — M47896 Other spondylosis, lumbar region: Secondary | ICD-10-CM | POA: Diagnosis not present

## 2016-08-10 DIAGNOSIS — Z794 Long term (current) use of insulin: Secondary | ICD-10-CM

## 2016-08-10 DIAGNOSIS — E785 Hyperlipidemia, unspecified: Secondary | ICD-10-CM

## 2016-08-10 DIAGNOSIS — R972 Elevated prostate specific antigen [PSA]: Secondary | ICD-10-CM

## 2016-08-10 DIAGNOSIS — N529 Male erectile dysfunction, unspecified: Secondary | ICD-10-CM

## 2016-08-10 DIAGNOSIS — N183 Chronic kidney disease, stage 3 unspecified: Secondary | ICD-10-CM

## 2016-08-10 DIAGNOSIS — B351 Tinea unguium: Secondary | ICD-10-CM

## 2016-08-10 DIAGNOSIS — N289 Disorder of kidney and ureter, unspecified: Secondary | ICD-10-CM

## 2016-08-10 DIAGNOSIS — E1122 Type 2 diabetes mellitus with diabetic chronic kidney disease: Secondary | ICD-10-CM

## 2016-08-10 DIAGNOSIS — R350 Frequency of micturition: Secondary | ICD-10-CM

## 2016-08-10 HISTORY — DX: Low back pain, unspecified: M54.50

## 2016-08-10 LAB — CBC
HCT: 35.6 % — ABNORMAL LOW (ref 39.0–52.0)
HEMOGLOBIN: 12.1 g/dL — AB (ref 13.0–17.0)
MCHC: 34 g/dL (ref 30.0–36.0)
MCV: 84.9 fl (ref 78.0–100.0)
PLATELETS: 230 10*3/uL (ref 150.0–400.0)
RBC: 4.2 Mil/uL — AB (ref 4.22–5.81)
RDW: 13.4 % (ref 11.5–15.5)
WBC: 6 10*3/uL (ref 4.0–10.5)

## 2016-08-10 LAB — LIPID PANEL
Cholesterol: 156 mg/dL (ref 0–200)
HDL: 29.4 mg/dL — ABNORMAL LOW (ref >39.00)
LDL CALC: 92 mg/dL (ref 0–99)
NonHDL: 126.7
TRIGLYCERIDES: 176 mg/dL — AB (ref 0.0–149.0)
Total CHOL/HDL Ratio: 5
VLDL: 35.2 mg/dL (ref 0.0–40.0)

## 2016-08-10 LAB — COMPREHENSIVE METABOLIC PANEL
ALBUMIN: 3.6 g/dL (ref 3.5–5.2)
ALT: 17 U/L (ref 0–53)
AST: 21 U/L (ref 0–37)
Alkaline Phosphatase: 65 U/L (ref 39–117)
BUN: 29 mg/dL — ABNORMAL HIGH (ref 6–23)
CALCIUM: 9.2 mg/dL (ref 8.4–10.5)
CHLORIDE: 105 meq/L (ref 96–112)
CO2: 23 mEq/L (ref 19–32)
Creatinine, Ser: 1.63 mg/dL — ABNORMAL HIGH (ref 0.40–1.50)
GFR: 53.55 mL/min — ABNORMAL LOW (ref >60.00)
Glucose, Bld: 303 mg/dL — ABNORMAL HIGH (ref 70–99)
POTASSIUM: 4.1 meq/L (ref 3.5–5.1)
SODIUM: 136 meq/L (ref 135–145)
Total Bilirubin: 0.5 mg/dL (ref 0.2–1.2)
Total Protein: 7.3 g/dL (ref 6.0–8.3)

## 2016-08-10 LAB — URINALYSIS, ROUTINE W REFLEX MICROSCOPIC
Bilirubin Urine: NEGATIVE
Ketones, ur: NEGATIVE
Leukocytes, UA: NEGATIVE
Nitrite: NEGATIVE
Specific Gravity, Urine: 1.02 (ref 1.000–1.030)
Total Protein, Urine: 30 — AB
Urine Glucose: 250 — AB
Urobilinogen, UA: 0.2 (ref 0.0–1.0)
pH: 5.5 (ref 5.0–8.0)

## 2016-08-10 LAB — PSA, MEDICARE: PSA: 4.37 ng/mL — AB (ref 0.10–4.00)

## 2016-08-10 LAB — TSH: TSH: 1.74 u[IU]/mL (ref 0.35–4.50)

## 2016-08-10 IMAGING — DX DG LUMBAR SPINE 2-3V
4 series · 4 of 4 positions shown · non-contrast
Comparison: None.

CLINICAL DATA: Low back pain without sciatica

EXAM:
LUMBAR SPINE - 2-3 VIEW

[l-spine ap (1 of 2)]
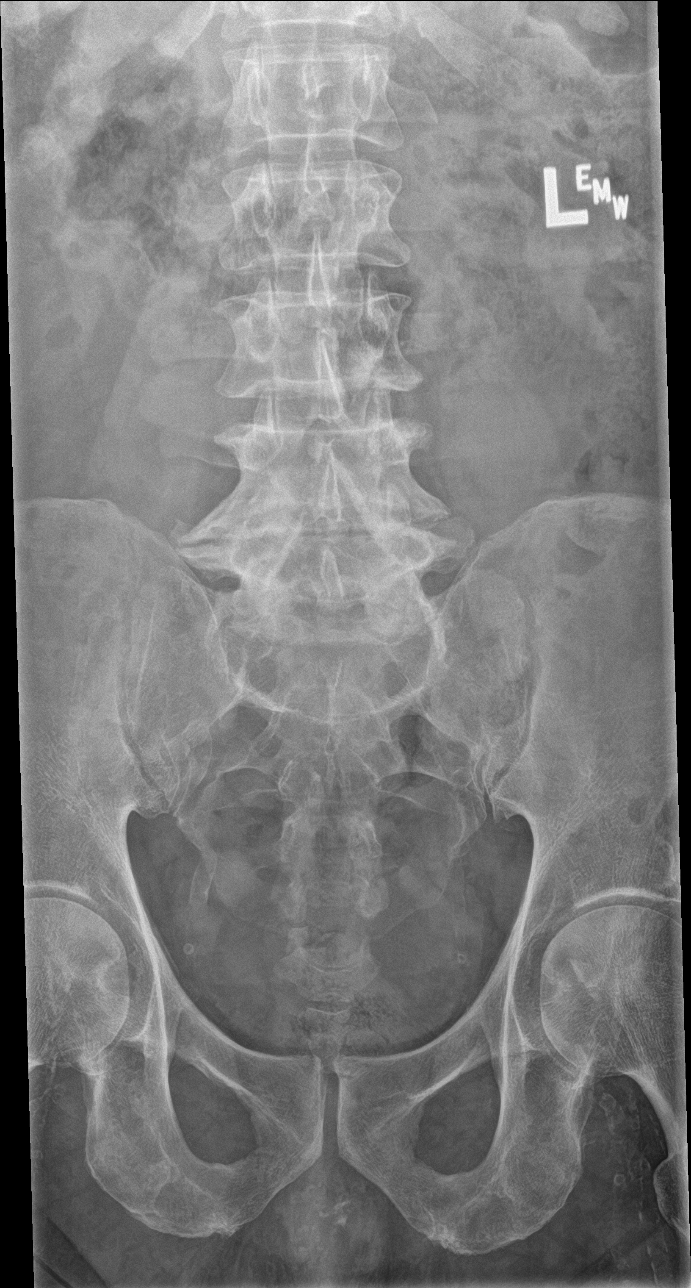

[l-spine lat]
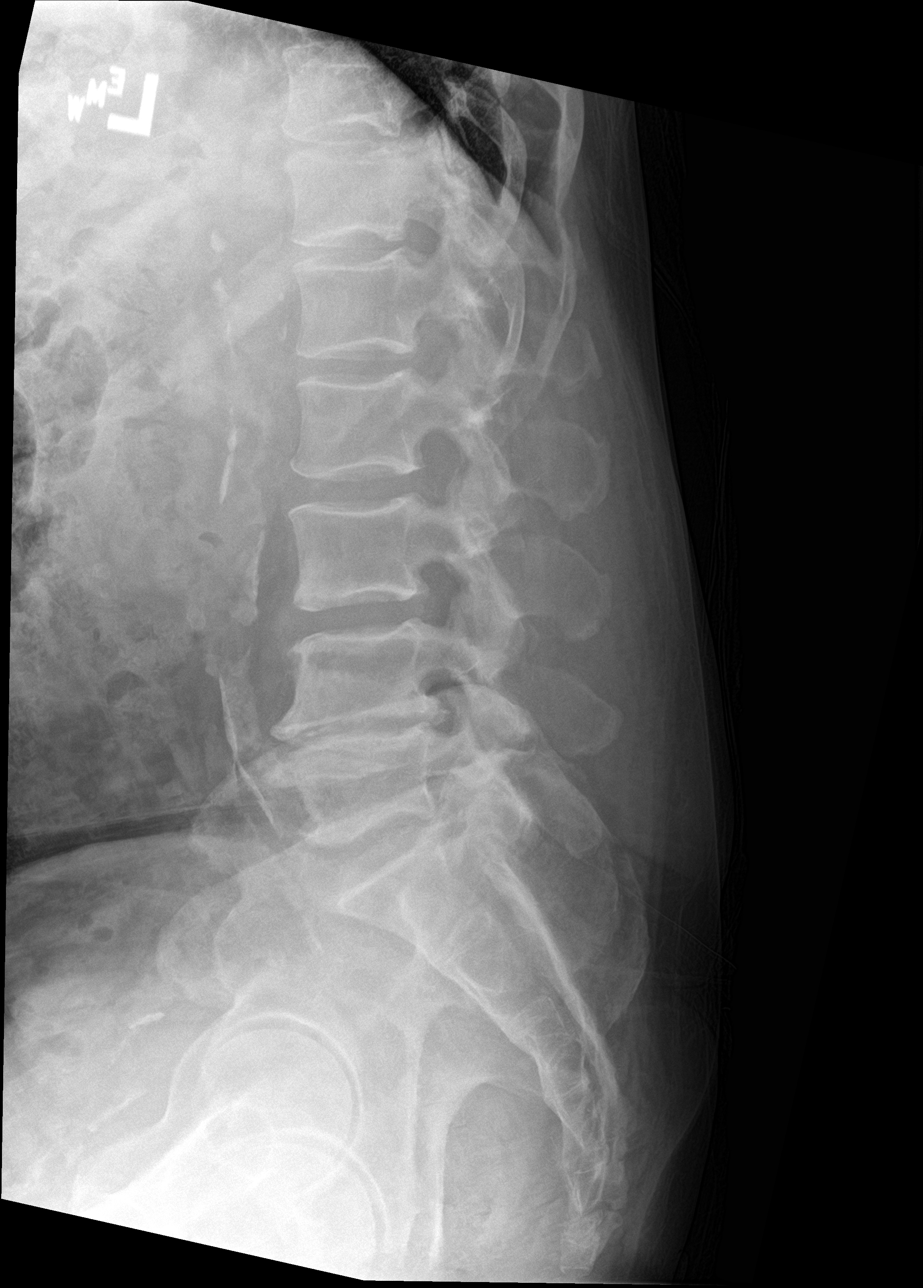

[l-spine spot]
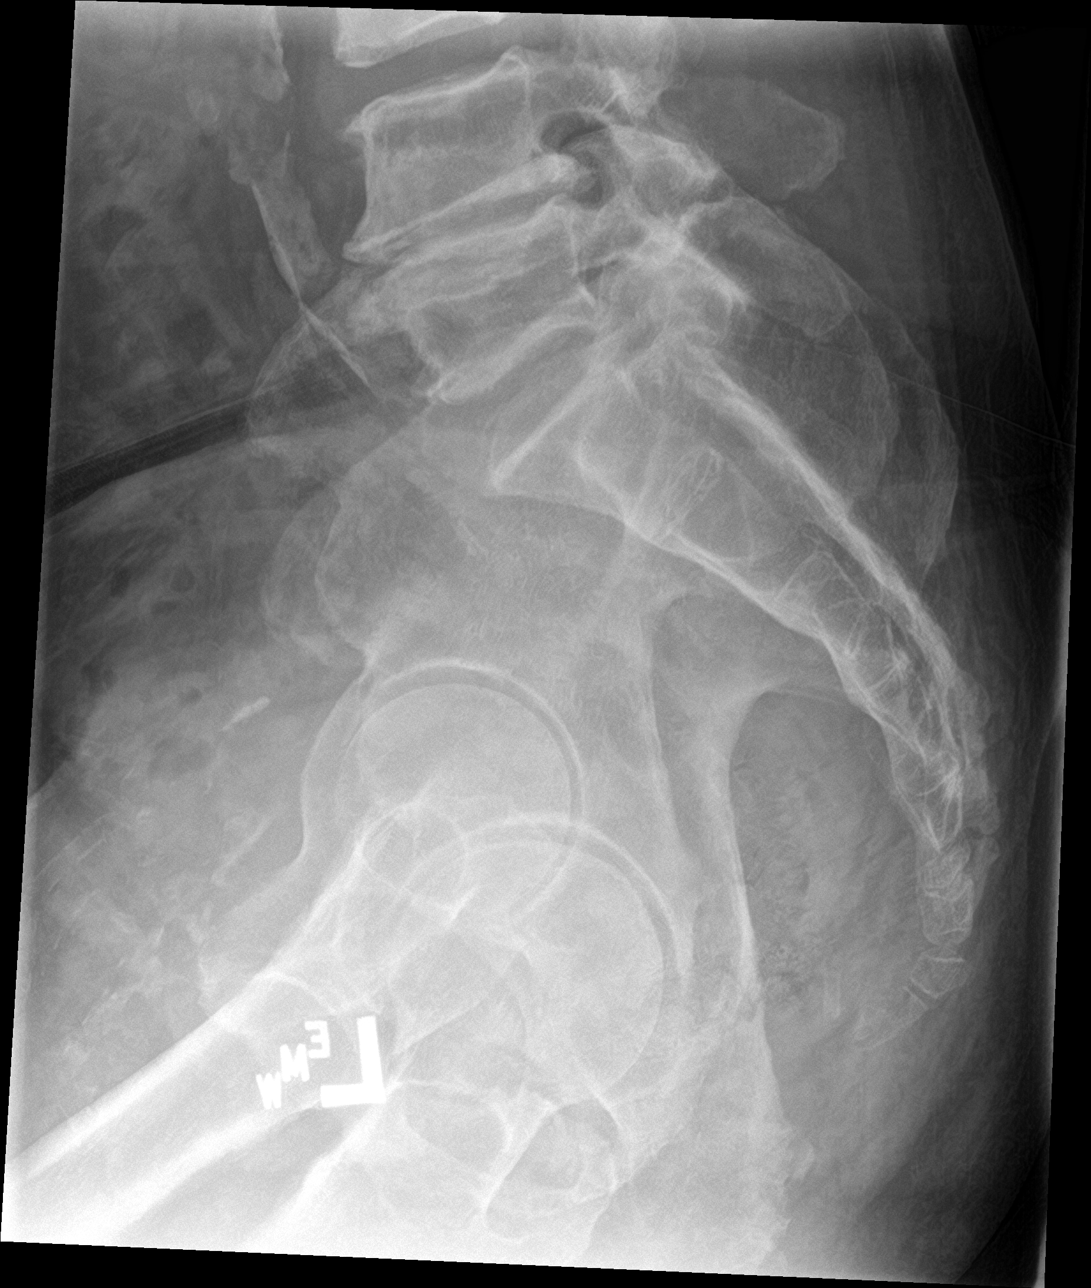

[l-spine ap (2 of 2)]
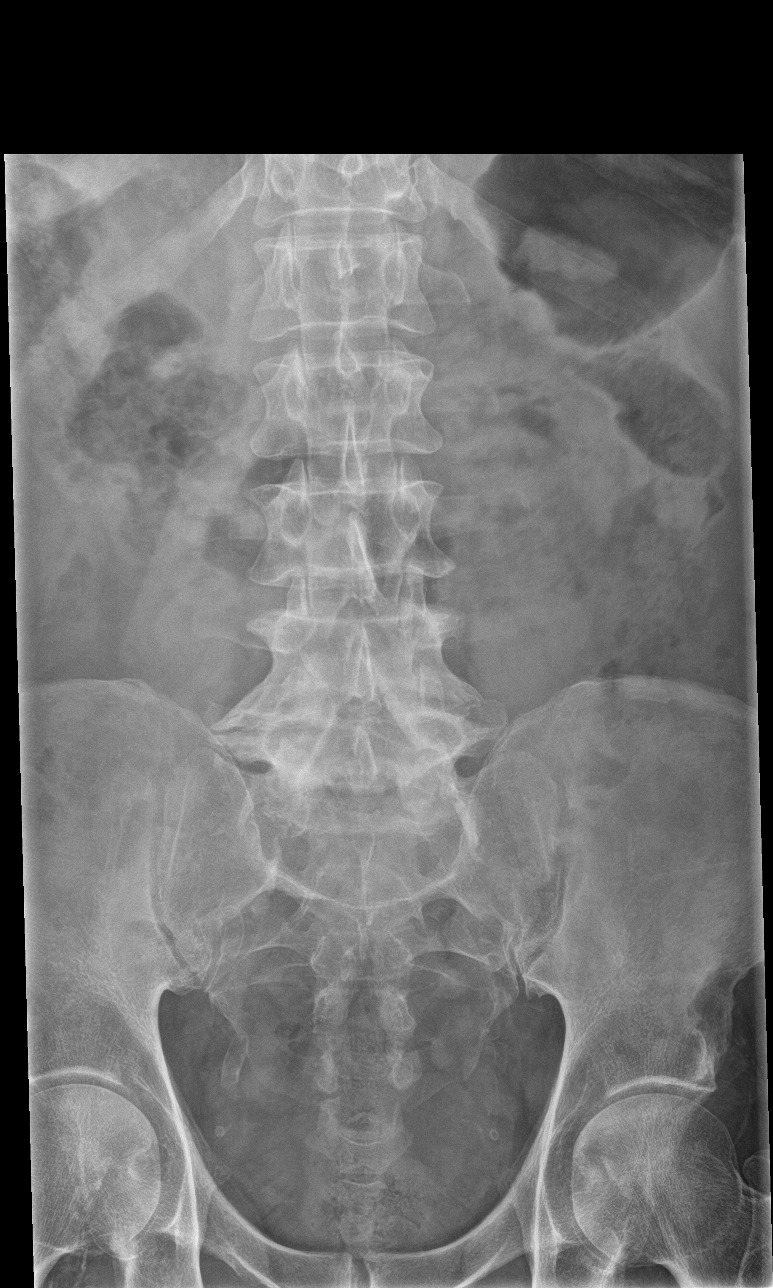

[4 of 4 positions shown; findings below may reference images not displayed]

FINDINGS: Advanced degenerative change L4-5 with disc space narrowing and
prominent spurring. Mild disc degeneration at L2-3 and L3-4. Normal
alignment. Negative for fracture.

Atherosclerotic aorta
IMPRESSION: Lumbar degenerative change most notably at L4-5. No acute
abnormality.
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
## 2016-08-10 NOTE — Patient Instructions (Addendum)
OTC Lidocaine patches/ gel or Salon pas   3 to 4 drops of Peroxide in the ears after the shower to release the ear wax.    Back Pain, Adult Back pain is very common in adults.The cause of back pain is rarely dangerous and the pain often gets better over time.The cause of your back pain may not be known. Some common causes of back pain include:  Strain of the muscles or ligaments supporting the spine.  Wear and tear (degeneration) of the spinal disks.  Arthritis.  Direct injury to the back. For many people, back pain may return. Since back pain is rarely dangerous, most people can learn to manage this condition on their own. Follow these instructions at home: Watch your back pain for any changes. The following actions may help to lessen any discomfort you are feeling:  Remain active. It is stressful on your back to sit or stand in one place for long periods of time. Do not sit, drive, or stand in one place for more than 30 minutes at a time. Take short walks on even surfaces as soon as you are able.Try to increase the length of time you walk each day.  Exercise regularly as directed by your health care provider. Exercise helps your back heal faster. It also helps avoid future injury by keeping your muscles strong and flexible.  Do not stay in bed.Resting more than 1-2 days can delay your recovery.  Pay attention to your body when you bend and lift. The most comfortable positions are those that put less stress on your recovering back. Always use proper lifting techniques, including:  Bending your knees.  Keeping the load close to your body.  Avoiding twisting.  Find a comfortable position to sleep. Use a firm mattress and lie on your side with your knees slightly bent. If you lie on your back, put a pillow under your knees.  Avoid feeling anxious or stressed.Stress increases muscle tension and can worsen back pain.It is important to recognize when you are anxious or stressed  and learn ways to manage it, such as with exercise.  Take medicines only as directed by your health care provider. Over-the-counter medicines to reduce pain and inflammation are often the most helpful.Your health care provider may prescribe muscle relaxant drugs.These medicines help dull your pain so you can more quickly return to your normal activities and healthy exercise.  Apply ice to the injured area:  Put ice in a plastic bag.  Place a towel between your skin and the bag.  Leave the ice on for 20 minutes, 2-3 times a day for the first 2-3 days. After that, ice and heat may be alternated to reduce pain and spasms.  Maintain a healthy weight. Excess weight puts extra stress on your back and makes it difficult to maintain good posture. Contact a health care provider if:  You have pain that is not relieved with rest or medicine.  You have increasing pain going down into the legs or buttocks.  You have pain that does not improve in one week.  You have night pain.  You lose weight.  You have a fever or chills. Get help right away if:  You develop new bowel or bladder control problems.  You have unusual weakness or numbness in your arms or legs.  You develop nausea or vomiting.  You develop abdominal pain.  You feel faint. This information is not intended to replace advice given to you by your health care provider.  Make sure you discuss any questions you have with your health care provider. Document Released: 05/22/2005 Document Revised: 09/30/2015 Document Reviewed: 09/23/2013 Elsevier Interactive Patient Education  2017 [employer].

## 2016-08-10 NOTE — Assessment & Plan Note (Signed)
hgba1c acceptable, minimize simple carbs. Increase exercise as tolerated. Continue current meds, following with endocrinology 
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
## 2016-08-10 NOTE — Assessment & Plan Note (Signed)
Well controlled, no changes to meds. Encouraged heart healthy diet such as the DASH diet and exercise as tolerated.  °

## 2016-08-10 NOTE — Progress Notes (Signed)
Pre visit review using our clinic review tool, if applicable. No additional management support is needed unless otherwise documented below in the visit note. 

## 2016-08-13 ENCOUNTER — Encounter: Payer: Self-pay | Admitting: Family Medicine

## 2016-08-13 DIAGNOSIS — R972 Elevated prostate specific antigen [PSA]: Secondary | ICD-10-CM

## 2016-08-13 HISTORY — DX: Elevated prostate specific antigen (PSA): R97.20

## 2016-08-13 LAB — URINE CULTURE: ORGANISM ID, BACTERIA: NO GROWTH

## 2016-08-13 NOTE — Assessment & Plan Note (Signed)
Soak feet in distilled white vinegar and hot water, apply Lamisil cream daily

## 2016-08-13 NOTE — Assessment & Plan Note (Signed)
Urinary urgency. He is offered a referral to urology for further consideration but is not sure he wants to accept it.  He will let us know.

## 2016-10-23 ENCOUNTER — Ambulatory Visit: Payer: Commercial Managed Care - HMO | Admitting: Endocrinology

## 2016-11-10 ENCOUNTER — Telehealth: Payer: Self-pay | Admitting: *Deleted
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
## 2016-11-10 NOTE — Telephone Encounter (Signed)
Spoke w/ patient's wife, AWV scheduled 11/13/16 immediately following PCP appointment.

## 2016-11-13 ENCOUNTER — Ambulatory Visit (INDEPENDENT_AMBULATORY_CARE_PROVIDER_SITE_OTHER): Payer: Medicare HMO | Admitting: Family Medicine

## 2016-11-13 ENCOUNTER — Encounter: Payer: Self-pay | Admitting: Family Medicine

## 2016-11-13 VITALS — BP 150/65 | HR 74 | Temp 98.1°F | Resp 18 | Wt 177.0 lb

## 2016-11-13 DIAGNOSIS — N289 Disorder of kidney and ureter, unspecified: Secondary | ICD-10-CM

## 2016-11-13 DIAGNOSIS — E1151 Type 2 diabetes mellitus with diabetic peripheral angiopathy without gangrene: Secondary | ICD-10-CM | POA: Diagnosis not present

## 2016-11-13 DIAGNOSIS — E782 Mixed hyperlipidemia: Secondary | ICD-10-CM

## 2016-11-13 DIAGNOSIS — Z0000 Encounter for general adult medical examination without abnormal findings: Secondary | ICD-10-CM | POA: Diagnosis not present

## 2016-11-13 DIAGNOSIS — G609 Hereditary and idiopathic neuropathy, unspecified: Secondary | ICD-10-CM

## 2016-11-13 DIAGNOSIS — B354 Tinea corporis: Secondary | ICD-10-CM | POA: Diagnosis not present

## 2016-11-13 DIAGNOSIS — E138 Other specified diabetes mellitus with unspecified complications: Secondary | ICD-10-CM | POA: Diagnosis not present

## 2016-11-13 DIAGNOSIS — R972 Elevated prostate specific antigen [PSA]: Secondary | ICD-10-CM | POA: Diagnosis not present

## 2016-11-13 DIAGNOSIS — I10 Essential (primary) hypertension: Secondary | ICD-10-CM | POA: Diagnosis not present

## 2016-11-13 DIAGNOSIS — I159 Secondary hypertension, unspecified: Secondary | ICD-10-CM | POA: Diagnosis not present

## 2016-11-13 LAB — LIPID PANEL
CHOLESTEROL: 142 mg/dL (ref 0–200)
HDL: 28.9 mg/dL — ABNORMAL LOW (ref >39.00)
LDL CALC: 94 mg/dL (ref 0–99)
NonHDL: 112.95
Total CHOL/HDL Ratio: 5
Triglycerides: 96 mg/dL (ref 0.0–149.0)
VLDL: 19.2 mg/dL (ref 0.0–40.0)

## 2016-11-13 LAB — COMPREHENSIVE METABOLIC PANEL
ALBUMIN: 3.6 g/dL (ref 3.5–5.2)
ALK PHOS: 52 U/L (ref 39–117)
ALT: 12 U/L (ref 0–53)
AST: 16 U/L (ref 0–37)
BILIRUBIN TOTAL: 0.5 mg/dL (ref 0.2–1.2)
BUN: 33 mg/dL — AB (ref 6–23)
CALCIUM: 9.2 mg/dL (ref 8.4–10.5)
CO2: 24 mEq/L (ref 19–32)
CREATININE: 1.7 mg/dL — AB (ref 0.40–1.50)
Chloride: 108 mEq/L (ref 96–112)
GFR: 50.97 mL/min — ABNORMAL LOW (ref >60.00)
Glucose, Bld: 245 mg/dL — ABNORMAL HIGH (ref 70–99)
Potassium: 4.7 mEq/L (ref 3.5–5.1)
SODIUM: 139 meq/L (ref 135–145)
TOTAL PROTEIN: 7.3 g/dL (ref 6.0–8.3)

## 2016-11-13 LAB — CBC
HEMATOCRIT: 35.7 % — AB (ref 39.0–52.0)
HEMOGLOBIN: 11.8 g/dL — AB (ref 13.0–17.0)
MCHC: 33 g/dL (ref 30.0–36.0)
MCV: 85.1 fl (ref 78.0–100.0)
PLATELETS: 223 10*3/uL (ref 150.0–400.0)
RBC: 4.19 Mil/uL — AB (ref 4.22–5.81)
RDW: 13.4 % (ref 11.5–15.5)
WBC: 7.7 10*3/uL (ref 4.0–10.5)

## 2016-11-13 LAB — HEMOGLOBIN A1C: HEMOGLOBIN A1C: 9.3 % — AB (ref 4.6–6.5)

## 2016-11-13 LAB — TSH: TSH: 1.29 u[IU]/mL (ref 0.35–4.50)

## 2016-11-13 MED ORDER — METOPROLOL TARTRATE 25 MG PO TABS: 12.5000 mg | ORAL_TABLET | Freq: Two times a day (BID) | ORAL | 3 refills | Status: DC

## 2016-11-13 MED ORDER — ATORVASTATIN CALCIUM 80 MG PO TABS: 80.0000 mg | ORAL_TABLET | Freq: Every day | ORAL | 1 refills | Status: AC

## 2016-11-13 MED ORDER — CLOTRIMAZOLE-BETAMETHASONE 1-0.05 % EX CREA: TOPICAL_CREAM | Freq: Two times a day (BID) | CUTANEOUS | 2 refills | Status: DC

## 2016-11-13 MED ORDER — CARVEDILOL 25 MG PO TABS: 25.0000 mg | ORAL_TABLET | Freq: Two times a day (BID) | ORAL | 1 refills | Status: DC

## 2016-11-13 MED ORDER — ACCU-CHEK AVIVA PLUS W/DEVICE KIT: PACK | 0 refills | Status: DC

## 2016-11-13 MED ORDER — GABAPENTIN 100 MG PO CAPS: ORAL_CAPSULE | ORAL | 2 refills | Status: DC

## 2016-11-13 MED ORDER — LISINOPRIL-HYDROCHLOROTHIAZIDE 20-12.5 MG PO TABS: 1.0000 | ORAL_TABLET | Freq: Two times a day (BID) | ORAL | 2 refills | Status: DC

## 2016-11-13 MED ORDER — ACCU-CHEK SOFTCLIX LANCETS MISC: 1 refills | Status: AC

## 2016-11-13 MED ORDER — AMLODIPINE BESYLATE 10 MG PO TABS: 10.0000 mg | ORAL_TABLET | Freq: Every day | ORAL | 2 refills | Status: DC

## 2016-11-13 MED ORDER — BD SWAB SINGLE USE REGULAR PADS: MEDICATED_PAD | 6 refills | Status: AC

## 2016-11-13 NOTE — Patient Instructions (Addendum)
Jermaine Mitchell , Thank you for taking time to come for your Medicare Wellness Visit. I appreciate your ongoing commitment to your health goals. Please review the following plan we discussed and let me know if I can assist you in the future.   Create and bring a copy of your advance directives to your next office visit.  Consider the new shingles vaccine, Shingrix. This is available at most pharmacies.   Schedule an appointment with Dr. Matthews for your routine diabetic eye exam.  These are the goals we discussed: Goals    . Blood Pressure < 140/90       This is a list of the screening recommended for you and due dates:  Health Maintenance  Topic Date Due  . Eye exam for diabetics  03/24/1951  . Flu Shot  03/12/2017*  . Hemoglobin A1C  01/23/2017  . Complete foot exam   07/26/2017  . Tetanus Vaccine  06/15/2018  . Colon Cancer Screening  04/13/2021  . Pneumonia vaccines  Completed  *Topic was postponed. The date shown is not the original due date.   Carbohydrate Counting for Diabetes Mellitus, Adult Carbohydrate counting is a method for keeping track of how many carbohydrates you eat. Eating carbohydrates naturally increases the amount of sugar (glucose) in the blood. Counting how many carbohydrates you eat helps keep your blood glucose within normal limits, which helps you manage your diabetes (diabetes mellitus). It is important to know how many carbohydrates you can safely have in each meal. This is different for every person. A diet and nutrition specialist (registered dietitian) can help you make a meal plan and calculate how many carbohydrates you should have at each meal and snack. Carbohydrates are found in the following foods:  Grains, such as breads and cereals.  Dried beans and soy products.  Starchy vegetables, such as potatoes, peas, and corn.  Fruit and fruit juices.  Milk and yogurt.  Sweets and snack foods, such as cake, cookies, candy, chips, and soft  drinks.  How do I count carbohydrates? There are two ways to count carbohydrates in food. You can use either of the methods or a combination of both. Reading "Nutrition Facts" on packaged food The "Nutrition Facts" list is included on the labels of almost all packaged foods and beverages in the [place] It includes:  The serving size.  Information about nutrients in each serving, including the grams (g) of carbohydrate per serving.  To use the "Nutrition Facts":  Decide how many servings you will have.  Multiply the number of servings by the number of carbohydrates per serving.  The resulting number is the total amount of carbohydrates that you will be having.  Learning standard serving sizes of other foods When you eat foods containing carbohydrates that are not packaged or do not include "Nutrition Facts" on the label, you need to measure the servings in order to count the amount of carbohydrates:  Measure the foods that you will eat with a food scale or measuring cup, if needed.  Decide how many standard-size servings you will eat.  Multiply the number of servings by 15. Most carbohydrate-rich foods have about 15 g of carbohydrates per serving. ? For example, if you eat 8 oz (170 g) of strawberries, you will have eaten 2 servings and 30 g of carbohydrates (2 servings x 15 g = 30 g).  For foods that have more than one food mixed, such as soups and casseroles, you must count the carbohydrates in each food   that is included.  The following list contains standard serving sizes of common carbohydrate-rich foods. Each of these servings has about 15 g of carbohydrates:   hamburger bun or  English muffin.   oz (15 mL) syrup.   oz (14 g) jelly.  1 slice of bread.  1 six-inch tortilla.  3 oz (85 g) cooked rice or pasta.  4 oz (113 g) cooked dried beans.  4 oz (113 g) starchy vegetable, such as peas, corn, or potatoes.  4 oz (113 g) hot cereal.  4 oz (113 g) mashed potatoes  or  of a large baked potato.  4 oz (113 g) canned or frozen fruit.  4 oz (120 mL) fruit juice.  4-6 crackers.  6 chicken nuggets.  6 oz (170 g) unsweetened dry cereal.  6 oz (170 g) plain fat-free yogurt or yogurt sweetened with artificial sweeteners.  8 oz (240 mL) milk.  8 oz (170 g) fresh fruit or one small piece of fruit.  24 oz (680 g) popped popcorn.  Example of carbohydrate counting Sample meal  3 oz (85 g) chicken breast.  6 oz (170 g) brown rice.  4 oz (113 g) corn.  8 oz (240 mL) milk.  8 oz (170 g) strawberries with sugar-free whipped topping. Carbohydrate calculation 1. Identify the foods that contain carbohydrates: ? Rice. ? Corn. ? Milk. ? Strawberries. 2. Calculate how many servings you have of each food: ? 2 servings rice. ? 1 serving corn. ? 1 serving milk. ? 1 serving strawberries. 3. Multiply each number of servings by 15 g: ? 2 servings rice x 15 g = 30 g. ? 1 serving corn x 15 g = 15 g. ? 1 serving milk x 15 g = 15 g. ? 1 serving strawberries x 15 g = 15 g. 4. Add together all of the amounts to find the total grams of carbohydrates eaten: ? 30 g + 15 g + 15 g + 15 g = 75 g of carbohydrates total. This information is not intended to replace advice given to you by your health care provider. Make sure you discuss any questions you have with your health care provider. Document Released: 05/22/2005 Document Revised: 12/10/2015 Document Reviewed: 11/03/2015 Elsevier Interactive Patient Education  2018 Elsevier Inc.   Preventive Care 65 Years and Older, Male Preventive care refers to lifestyle choices and visits with your health care provider that can promote health and wellness. What does preventive care include?  A yearly physical exam. This is also called an annual well check.  Dental exams once or twice a year.  Routine eye exams. Ask your health care provider how often you should have your eyes checked.  Personal lifestyle choices,  including: ? Daily care of your teeth and gums. ? Regular physical activity. ? Eating a healthy diet. ? Avoiding tobacco and drug use. ? Limiting alcohol use. ? Practicing safe sex. ? Taking low doses of aspirin every day. ? Taking vitamin and mineral supplements as recommended by your health care provider. What happens during an annual well check? The services and screenings done by your health care provider during your annual well check will depend on your age, overall health, lifestyle risk factors, and family history of disease. Counseling Your health care provider may ask you questions about your:  Alcohol use.  Tobacco use.  Drug use.  Emotional well-being.  Home and relationship well-being.  Sexual activity.  Eating habits.  History of falls.  Memory and ability to   understand (cognition).  Work and work environment.  Screening You may have the following tests or measurements:  Height, weight, and BMI.  Blood pressure.  Lipid and cholesterol levels. These may be checked every 5 years, or more frequently if you are over 50 years old.  Skin check.  Lung cancer screening. You may have this screening every year starting at age 55 if you have a 30-pack-year history of smoking and currently smoke or have quit within the past 15 years.  Fecal occult blood test (FOBT) of the stool. You may have this test every year starting at age 50.  Flexible sigmoidoscopy or colonoscopy. You may have a sigmoidoscopy every 5 years or a colonoscopy every 10 years starting at age 50.  Prostate cancer screening. Recommendations will vary depending on your family history and other risks.  Hepatitis C blood test.  Hepatitis B blood test.  Sexually transmitted disease (STD) testing.  Diabetes screening. This is done by checking your blood sugar (glucose) after you have not eaten for a while (fasting). You may have this done every 1-3 years.  Abdominal aortic aneurysm (AAA)  screening. You may need this if you are a current or former smoker.  Osteoporosis. You may be screened starting at age 70 if you are at high risk.  Talk with your health care provider about your test results, treatment options, and if necessary, the need for more tests. Vaccines Your health care provider may recommend certain vaccines, such as:  Influenza vaccine. This is recommended every year.  Tetanus, diphtheria, and acellular pertussis (Tdap, Td) vaccine. You may need a Td booster every 10 years.  Varicella vaccine. You may need this if you have not been vaccinated.  Zoster vaccine. You may need this after age 60.  Measles, mumps, and rubella (MMR) vaccine. You may need at least one dose of MMR if you were born in 1957 or later. You may also need a second dose.  Pneumococcal 13-valent conjugate (PCV13) vaccine. One dose is recommended after age 65.  Pneumococcal polysaccharide (PPSV23) vaccine. One dose is recommended after age 65.  Meningococcal vaccine. You may need this if you have certain conditions.  Hepatitis A vaccine. You may need this if you have certain conditions or if you travel or work in places where you may be exposed to hepatitis A.  Hepatitis B vaccine. You may need this if you have certain conditions or if you travel or work in places where you may be exposed to hepatitis B.  Haemophilus influenzae type b (Hib) vaccine. You may need this if you have certain risk factors.  Talk to your health care provider about which screenings and vaccines you need and how often you need them. This information is not intended to replace advice given to you by your health care provider. Make sure you discuss any questions you have with your health care provider. Document Released: 06/18/2015 Document Revised: 02/09/2016 Document Reviewed: 03/23/2015 Elsevier Interactive Patient Education  2017 Elsevier Inc.   

## 2016-11-13 NOTE — Assessment & Plan Note (Signed)
Check cmp 
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
## 2016-11-13 NOTE — Assessment & Plan Note (Signed)
Poorly controlled will alter medications, encouraged DASH diet, minimize caffeine and obtain adequate sleep. Report concerning symptoms and follow up as directed and as needed 

## 2016-11-17 NOTE — Assessment & Plan Note (Signed)
It is keeping him up at night, will try a course of Gabapentin.

## 2016-11-17 NOTE — Assessment & Plan Note (Signed)
minimize simple carbs. Increase exercise as tolerated. Continue current meds  

## 2016-11-17 NOTE — Assessment & Plan Note (Signed)
Tolerating statin, encouraged heart healthy diet, avoid trans fats, minimize simple carbs and saturated fats. Increase exercise as tolerated 

## 2016-11-28 ENCOUNTER — Encounter: Payer: Self-pay | Admitting: Endocrinology

## 2016-11-28 ENCOUNTER — Ambulatory Visit (INDEPENDENT_AMBULATORY_CARE_PROVIDER_SITE_OTHER): Payer: Medicare HMO | Admitting: Endocrinology

## 2016-11-28 VITALS — BP 140/80 | HR 68 | Wt 176.0 lb

## 2016-11-28 DIAGNOSIS — N183 Chronic kidney disease, stage 3 (moderate): Secondary | ICD-10-CM | POA: Diagnosis not present

## 2016-11-28 DIAGNOSIS — Z794 Long term (current) use of insulin: Secondary | ICD-10-CM | POA: Diagnosis not present

## 2016-11-28 DIAGNOSIS — E1122 Type 2 diabetes mellitus with diabetic chronic kidney disease: Secondary | ICD-10-CM | POA: Diagnosis not present

## 2016-11-28 NOTE — Patient Instructions (Addendum)
check your blood sugar twice a day.  vary the time of day when you check, between before the 3 meals, and at bedtime.  also check if you have symptoms of your blood sugar being too high or too low.  please keep a record of the readings and bring it to your next appointment here (or you can bring the meter itself).  You can write it on any piece of paper.  please call us sooner if your blood sugar goes below 70, or if you have a lot of readings over 200.   Please continue the 70/30 insulin, 35 units with breakfast, and 5 units with the evening meal.  Please take this, no matter what your blood sugar is.   On this type of insulin schedule, you should eat meals on a regular schedule.  If a meal is missed or significantly delayed, your blood sugar could go low.  Please come back for a follow-up appointment in 3 months.
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
## 2016-12-11 ENCOUNTER — Encounter (INDEPENDENT_AMBULATORY_CARE_PROVIDER_SITE_OTHER): Payer: Medicare HMO | Admitting: Ophthalmology

## 2016-12-11 DIAGNOSIS — I10 Essential (primary) hypertension: Secondary | ICD-10-CM | POA: Diagnosis not present

## 2016-12-11 DIAGNOSIS — E10311 Type 1 diabetes mellitus with unspecified diabetic retinopathy with macular edema: Secondary | ICD-10-CM

## 2016-12-11 DIAGNOSIS — H35033 Hypertensive retinopathy, bilateral: Secondary | ICD-10-CM | POA: Diagnosis not present

## 2016-12-11 DIAGNOSIS — E113593 Type 2 diabetes mellitus with proliferative diabetic retinopathy without macular edema, bilateral: Secondary | ICD-10-CM | POA: Diagnosis not present

## 2016-12-11 DIAGNOSIS — H4311 Vitreous hemorrhage, right eye: Secondary | ICD-10-CM | POA: Diagnosis not present

## 2016-12-11 LAB — HM DIABETES EYE EXAM

## 2016-12-15 ENCOUNTER — Encounter: Payer: Self-pay | Admitting: Family Medicine

## 2016-12-18 ENCOUNTER — Encounter (INDEPENDENT_AMBULATORY_CARE_PROVIDER_SITE_OTHER): Payer: Medicare HMO | Admitting: Ophthalmology

## 2016-12-18 DIAGNOSIS — E113511 Type 2 diabetes mellitus with proliferative diabetic retinopathy with macular edema, right eye: Secondary | ICD-10-CM | POA: Diagnosis not present

## 2016-12-18 DIAGNOSIS — E113592 Type 2 diabetes mellitus with proliferative diabetic retinopathy without macular edema, left eye: Secondary | ICD-10-CM | POA: Diagnosis not present

## 2016-12-18 DIAGNOSIS — H35033 Hypertensive retinopathy, bilateral: Secondary | ICD-10-CM | POA: Diagnosis not present

## 2016-12-18 DIAGNOSIS — I10 Essential (primary) hypertension: Secondary | ICD-10-CM | POA: Diagnosis not present

## 2016-12-18 DIAGNOSIS — E11311 Type 2 diabetes mellitus with unspecified diabetic retinopathy with macular edema: Secondary | ICD-10-CM

## 2017-01-22 ENCOUNTER — Encounter (INDEPENDENT_AMBULATORY_CARE_PROVIDER_SITE_OTHER): Payer: Medicare HMO | Admitting: Ophthalmology

## 2017-01-22 DIAGNOSIS — I10 Essential (primary) hypertension: Secondary | ICD-10-CM

## 2017-01-22 DIAGNOSIS — E113512 Type 2 diabetes mellitus with proliferative diabetic retinopathy with macular edema, left eye: Secondary | ICD-10-CM

## 2017-01-22 DIAGNOSIS — H35033 Hypertensive retinopathy, bilateral: Secondary | ICD-10-CM | POA: Diagnosis not present

## 2017-01-22 DIAGNOSIS — E113591 Type 2 diabetes mellitus with proliferative diabetic retinopathy without macular edema, right eye: Secondary | ICD-10-CM

## 2017-01-22 DIAGNOSIS — E11311 Type 2 diabetes mellitus with unspecified diabetic retinopathy with macular edema: Secondary | ICD-10-CM

## 2017-02-20 ENCOUNTER — Other Ambulatory Visit: Payer: Self-pay | Admitting: Endocrinology

## 2017-02-20 ENCOUNTER — Ambulatory Visit: Payer: Medicare HMO | Admitting: Family Medicine

## 2017-02-20 DIAGNOSIS — Z0289 Encounter for other administrative examinations: Secondary | ICD-10-CM

## 2017-02-28 ENCOUNTER — Ambulatory Visit: Payer: Medicare HMO | Admitting: Endocrinology

## 2017-03-16 ENCOUNTER — Ambulatory Visit: Payer: Medicare HMO | Admitting: Endocrinology

## 2017-03-22 ENCOUNTER — Encounter: Payer: Self-pay | Admitting: Endocrinology

## 2017-03-22 ENCOUNTER — Ambulatory Visit (INDEPENDENT_AMBULATORY_CARE_PROVIDER_SITE_OTHER): Payer: Medicare HMO | Admitting: Endocrinology

## 2017-03-22 VITALS — BP 160/80 | HR 73 | Wt 179.2 lb

## 2017-03-22 DIAGNOSIS — E1122 Type 2 diabetes mellitus with diabetic chronic kidney disease: Secondary | ICD-10-CM | POA: Diagnosis not present

## 2017-03-22 DIAGNOSIS — N183 Chronic kidney disease, stage 3 (moderate): Secondary | ICD-10-CM | POA: Diagnosis not present

## 2017-03-22 DIAGNOSIS — Z794 Long term (current) use of insulin: Secondary | ICD-10-CM | POA: Diagnosis not present

## 2017-03-22 LAB — POCT GLYCOSYLATED HEMOGLOBIN (HGB A1C): Hemoglobin A1C: 8.5

## 2017-03-22 MED ORDER — INSULIN NPH ISOPHANE & REGULAR (70-30) 100 UNIT/ML ~~LOC~~ SUSP: SUBCUTANEOUS | 2 refills | Status: DC
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
## 2017-03-22 NOTE — Patient Instructions (Addendum)
check your blood sugar twice a day.  vary the time of day when you check, between before the 3 meals, and at bedtime.  also check if you have symptoms of your blood sugar being too high or too low.  please keep a record of the readings and bring it to your next appointment here (or you can bring the meter itself).  You can write it on any piece of paper.  please call us sooner if your blood sugar goes below 70, or if you have a lot of readings over 200.   Please change the 70/30 insulin to 30 units with breakfast, and 10 units with the evening meal.  Please take this, no matter what your blood sugar is.   On this type of insulin schedule, you should eat meals on a regular schedule.  If a meal is missed or significantly delayed, your blood sugar could go low.  Please come back for a follow-up appointment in 3 months.

## 2017-05-24 ENCOUNTER — Encounter (INDEPENDENT_AMBULATORY_CARE_PROVIDER_SITE_OTHER): Payer: Medicare HMO | Admitting: Ophthalmology

## 2017-06-20 ENCOUNTER — Encounter (INDEPENDENT_AMBULATORY_CARE_PROVIDER_SITE_OTHER): Payer: Medicare HMO | Admitting: Ophthalmology

## 2017-06-20 DIAGNOSIS — E11319 Type 2 diabetes mellitus with unspecified diabetic retinopathy without macular edema: Secondary | ICD-10-CM

## 2017-06-20 DIAGNOSIS — E113593 Type 2 diabetes mellitus with proliferative diabetic retinopathy without macular edema, bilateral: Secondary | ICD-10-CM

## 2017-06-20 DIAGNOSIS — I10 Essential (primary) hypertension: Secondary | ICD-10-CM | POA: Diagnosis not present

## 2017-06-20 DIAGNOSIS — H35033 Hypertensive retinopathy, bilateral: Secondary | ICD-10-CM

## 2017-06-22 ENCOUNTER — Ambulatory Visit: Payer: Medicare HMO | Admitting: Endocrinology

## 2017-06-24 ENCOUNTER — Other Ambulatory Visit: Payer: Self-pay | Admitting: Endocrinology

## 2017-06-26 ENCOUNTER — Other Ambulatory Visit: Payer: Self-pay

## 2017-06-26 MED ORDER — INSULIN NPH ISOPHANE & REGULAR (70-30) 100 UNIT/ML ~~LOC~~ SUSP: SUBCUTANEOUS | 2 refills | Status: DC

## 2017-07-10 ENCOUNTER — Ambulatory Visit: Payer: Medicare HMO | Admitting: Endocrinology

## 2017-07-31 ENCOUNTER — Ambulatory Visit: Payer: Medicare HMO | Admitting: Endocrinology

## 2017-07-31 ENCOUNTER — Encounter: Payer: Self-pay | Admitting: Endocrinology

## 2017-07-31 VITALS — BP 122/82 | HR 78 | Ht 69.0 in | Wt 178.0 lb

## 2017-07-31 DIAGNOSIS — N183 Chronic kidney disease, stage 3 unspecified: Secondary | ICD-10-CM

## 2017-07-31 DIAGNOSIS — E1122 Type 2 diabetes mellitus with diabetic chronic kidney disease: Secondary | ICD-10-CM

## 2017-07-31 DIAGNOSIS — Z794 Long term (current) use of insulin: Secondary | ICD-10-CM

## 2017-07-31 LAB — POCT GLYCOSYLATED HEMOGLOBIN (HGB A1C): HEMOGLOBIN A1C: 8.9

## 2017-07-31 MED ORDER — INSULIN NPH ISOPHANE & REGULAR (70-30) 100 UNIT/ML ~~LOC~~ SUSP: SUBCUTANEOUS | 2 refills | Status: DC

## 2017-07-31 NOTE — Patient Instructions (Addendum)
check your blood sugar twice a day.  vary the time of day when you check, between before the 3 meals, and at bedtime.  also check if you have symptoms of your blood sugar being too high or too low.  please keep a record of the readings and bring it to your next appointment here (or you can bring the meter itself).  You can write it on any piece of paper.  please call us sooner if your blood sugar goes below 70, or if you have a lot of readings over 200.   Please increase the 70/30 insulin to 32 units with breakfast, and 12 units with the evening meal.  Please take this, no matter what your blood sugar is.   On this type of insulin schedule, you should eat meals on a regular schedule.  If a meal is missed or significantly delayed, your blood sugar could go low.  Please come back for a follow-up appointment in 3 months.
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
## 2017-10-31 ENCOUNTER — Ambulatory Visit: Payer: Medicare HMO | Admitting: Endocrinology

## 2017-11-08 ENCOUNTER — Ambulatory Visit: Payer: Medicare HMO | Admitting: Endocrinology

## 2017-11-08 ENCOUNTER — Encounter: Payer: Self-pay | Admitting: Endocrinology

## 2017-11-08 VITALS — BP 180/82 | HR 70 | Wt 176.4 lb

## 2017-11-08 DIAGNOSIS — Z794 Long term (current) use of insulin: Secondary | ICD-10-CM | POA: Diagnosis not present

## 2017-11-08 DIAGNOSIS — N183 Chronic kidney disease, stage 3 unspecified: Secondary | ICD-10-CM

## 2017-11-08 DIAGNOSIS — E1122 Type 2 diabetes mellitus with diabetic chronic kidney disease: Secondary | ICD-10-CM

## 2017-11-08 LAB — POCT GLYCOSYLATED HEMOGLOBIN (HGB A1C): HEMOGLOBIN A1C: 8.7 % — AB (ref 4.0–5.6)

## 2017-11-08 MED ORDER — INSULIN NPH ISOPHANE & REGULAR (70-30) 100 UNIT/ML ~~LOC~~ SUSP: SUBCUTANEOUS | 2 refills | Status: DC

## 2017-11-08 NOTE — Patient Instructions (Addendum)
check your blood sugar twice a day.  vary the time of day when you check, between before the 3 meals, and at bedtime.  also check if you have symptoms of your blood sugar being too high or too low.  please keep a record of the readings and bring it to your next appointment here (or you can bring the meter itself).  You can write it on any piece of paper.  please call us sooner if your blood sugar goes below 70, or if you have a lot of readings over 200.   Please increase the 70/30 insulin to 36 units with breakfast, and 12 units with the evening meal.  Please take this, no matter what your blood sugar is.   On this type of insulin schedule, you should eat meals on a regular schedule.  If a meal is missed or significantly delayed, your blood sugar could go low.  Please come back for a follow-up appointment in 3 months.
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
## 2017-11-15 ENCOUNTER — Ambulatory Visit: Payer: Medicare HMO | Admitting: *Deleted

## 2017-11-21 ENCOUNTER — Ambulatory Visit: Payer: Medicare HMO | Admitting: *Deleted

## 2017-11-28 ENCOUNTER — Other Ambulatory Visit: Payer: Self-pay | Admitting: Family Medicine
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
## 2017-12-04 ENCOUNTER — Ambulatory Visit: Payer: Medicare HMO | Admitting: *Deleted

## 2017-12-18 ENCOUNTER — Encounter (INDEPENDENT_AMBULATORY_CARE_PROVIDER_SITE_OTHER): Payer: Medicare HMO | Admitting: Ophthalmology
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
## 2018-01-01 ENCOUNTER — Encounter: Payer: Self-pay | Admitting: *Deleted

## 2018-01-01 ENCOUNTER — Ambulatory Visit (INDEPENDENT_AMBULATORY_CARE_PROVIDER_SITE_OTHER): Payer: Medicare HMO | Admitting: *Deleted

## 2018-01-01 VITALS — BP 142/78 | HR 72 | Ht 69.0 in | Wt 180.4 lb

## 2018-01-01 DIAGNOSIS — Z0000 Encounter for general adult medical examination without abnormal findings: Secondary | ICD-10-CM | POA: Diagnosis not present

## 2018-01-01 NOTE — Patient Instructions (Signed)
Please schedule your next medicare wellness visit with me in 1 yr.  Follow up with Dr.Blyth as scheduled  Continue to eat heart healthy diet (full of fruits, vegetables, whole grains, lean protein, water--limit salt, fat, and sugar intake) and increase physical activity as tolerated.  Continue doing brain stimulating activities (puzzles, reading, adult coloring books, staying active) to keep memory sharp.    Jermaine Mitchell , Thank you for taking time to come for your Medicare Wellness Visit. I appreciate your ongoing commitment to your health goals. Please review the following plan we discussed and let me know if I can assist you in the future.   These are the goals we discussed: Goals    . Blood Pressure < 140/90       This is a list of the screening recommended for you and due dates:  Health Maintenance  Topic Date Due  . Eye exam for diabetics  12/11/2017  . Flu Shot  01/03/2018  . Hemoglobin A1C  05/10/2018  . Tetanus Vaccine  06/15/2018  . Complete foot exam   11/09/2018  . Colon Cancer Screening  04/13/2021  . Pneumonia vaccines  Completed    Health Maintenance, Male A healthy lifestyle and preventive care is important for your health and wellness. Ask your health care provider about what schedule of regular examinations is right for you. What should I know about weight and diet? Eat a Healthy Diet  Eat plenty of vegetables, fruits, whole grains, low-fat dairy products, and lean protein.  Do not eat a lot of foods high in solid fats, added sugars, or salt.  Maintain a Healthy Weight Regular exercise can help you achieve or maintain a healthy weight. You should:  Do at least 150 minutes of exercise each week. The exercise should increase your heart rate and make you sweat (moderate-intensity exercise).  Do strength-training exercises at least twice a week.  Watch Your Levels of Cholesterol and Blood Lipids  Have your blood tested for lipids and cholesterol every 5 years  starting at 75 years of age. If you are at high risk for heart disease, you should start having your blood tested when you are 75 years old. You may need to have your cholesterol levels checked more often if: ? Your lipid or cholesterol levels are high. ? You are older than 76 years of age. ? You are at high risk for heart disease.  What should I know about cancer screening? Many types of cancers can be detected early and may often be prevented. Lung Cancer  You should be screened every year for lung cancer if: ? You are a current smoker who has smoked for at least 30 years. ? You are a former smoker who has quit within the past 15 years.  Talk to your health care provider about your screening options, when you should start screening, and how often you should be screened.  Colorectal Cancer  Routine colorectal cancer screening usually begins at 75 years of age and should be repeated every 5-10 years until you are 75 years old. You may need to be screened more often if early forms of precancerous polyps or small growths are found. Your health care provider may recommend screening at an earlier age if you have risk factors for colon cancer.  Your health care provider may recommend using home test kits to check for hidden blood in the stool.  A small camera at the end of a tube can be used to examine your colon (  sigmoidoscopy or colonoscopy). This checks for the earliest forms of colorectal cancer.  Prostate and Testicular Cancer  Depending on your age and overall health, your health care provider may do certain tests to screen for prostate and testicular cancer.  Talk to your health care provider about any symptoms or concerns you have about testicular or prostate cancer.  Skin Cancer  Check your skin from head to toe regularly.  Tell your health care provider about any new moles or changes in moles, especially if: ? There is a change in a mole's size, shape, or color. ? You have a  mole that is larger than a pencil eraser.  Always use sunscreen. Apply sunscreen liberally and repeat throughout the day.  Protect yourself by wearing long sleeves, pants, a wide-brimmed hat, and sunglasses when outside.  What should I know about heart disease, diabetes, and high blood pressure?  If you are 66-45 years of age, have your blood pressure checked every 3-5 years. If you are 72 years of age or older, have your blood pressure checked every year. You should have your blood pressure measured twice-once when you are at a hospital or clinic, and once when you are not at a hospital or clinic. Record the average of the two measurements. To check your blood pressure when you are not at a hospital or clinic, you can use: ? An automated blood pressure machine at a pharmacy. ? A home blood pressure monitor.  Talk to your health care provider about your target blood pressure.  If you are between 41-39 years old, ask your health care provider if you should take aspirin to prevent heart disease.  Have regular diabetes screenings by checking your fasting blood sugar level. ? If you are at a normal weight and have a low risk for diabetes, have this test once every three years after the age of 18. ? If you are overweight and have a high risk for diabetes, consider being tested at a younger age or more often.  A one-time screening for abdominal aortic aneurysm (AAA) by ultrasound is recommended for men aged 39-75 years who are current or former smokers. What should I know about preventing infection? Hepatitis B If you have a higher risk for hepatitis B, you should be screened for this virus. Talk with your health care provider to find out if you are at risk for hepatitis B infection. Hepatitis C Blood testing is recommended for:  Everyone born from [birth year] through 1963.  Anyone with known risk factors for hepatitis C.  Sexually Transmitted Diseases (STDs)  You should be screened each year for  STDs including gonorrhea and chlamydia if: ? You are sexually active and are younger than 75 years of age. ? You are older than 75 years of age and your health care provider tells you that you are at risk for this type of infection. ? Your sexual activity has changed since you were last screened and you are at an increased risk for chlamydia or gonorrhea. Ask your health care provider if you are at risk.  Talk with your health care provider about whether you are at high risk of being infected with HIV. Your health care provider may recommend a prescription medicine to help prevent HIV infection.  What else can I do?  Schedule regular health, dental, and eye exams.  Stay current with your vaccines (immunizations).  Do not use any tobacco products, such as cigarettes, chewing tobacco, and e-cigarettes. If you need help quitting, ask  your health care provider.  Limit alcohol intake to no more than 2 drinks per day. One drink equals 12 ounces of beer, 5 ounces of wine, or 1 ounces of hard liquor.  Do not use street drugs.  Do not share needles.  Ask your health care provider for help if you need support or information about quitting drugs.  Tell your health care provider if you often feel depressed.  Tell your health care provider if you have ever been abused or do not feel safe at home. This information is not intended to replace advice given to you by your health care provider. Make sure you discuss any questions you have with your health care provider. Document Released: 11/18/2007 Document Revised: 01/19/2016 Document Reviewed: 02/23/2015 Elsevier Interactive Patient Education  [employer].

## 2018-01-01 NOTE — Progress Notes (Signed)
Reviewed  Yvonne R Lowne Chase, DO  

## 2018-01-17 ENCOUNTER — Encounter (INDEPENDENT_AMBULATORY_CARE_PROVIDER_SITE_OTHER): Payer: Medicare HMO | Admitting: Ophthalmology

## 2018-01-17 DIAGNOSIS — E11311 Type 2 diabetes mellitus with unspecified diabetic retinopathy with macular edema: Secondary | ICD-10-CM | POA: Diagnosis not present

## 2018-01-17 DIAGNOSIS — E113592 Type 2 diabetes mellitus with proliferative diabetic retinopathy without macular edema, left eye: Secondary | ICD-10-CM | POA: Diagnosis not present

## 2018-01-17 DIAGNOSIS — I10 Essential (primary) hypertension: Secondary | ICD-10-CM

## 2018-01-17 DIAGNOSIS — E113511 Type 2 diabetes mellitus with proliferative diabetic retinopathy with macular edema, right eye: Secondary | ICD-10-CM | POA: Diagnosis not present

## 2018-01-17 DIAGNOSIS — H35033 Hypertensive retinopathy, bilateral: Secondary | ICD-10-CM

## 2018-01-29 ENCOUNTER — Ambulatory Visit (INDEPENDENT_AMBULATORY_CARE_PROVIDER_SITE_OTHER): Payer: Medicare HMO | Admitting: Family Medicine

## 2018-01-29 ENCOUNTER — Ambulatory Visit (HOSPITAL_BASED_OUTPATIENT_CLINIC_OR_DEPARTMENT_OTHER)
Admission: RE | Admit: 2018-01-29 | Discharge: 2018-01-29 | Disposition: A | Discharge status: Home / Self Care | Payer: Medicare HMO | Source: Ambulatory Visit | Attending: Family Medicine | Admitting: Family Medicine

## 2018-01-29 ENCOUNTER — Other Ambulatory Visit: Payer: Self-pay | Admitting: Family Medicine

## 2018-01-29 ENCOUNTER — Encounter: Payer: Self-pay | Admitting: Family Medicine

## 2018-01-29 VITALS — BP 138/79 | HR 82 | Temp 97.7°F | Resp 18 | Ht 69.0 in | Wt 180.0 lb

## 2018-01-29 DIAGNOSIS — I159 Secondary hypertension, unspecified: Secondary | ICD-10-CM | POA: Diagnosis not present

## 2018-01-29 DIAGNOSIS — L03031 Cellulitis of right toe: Secondary | ICD-10-CM

## 2018-01-29 DIAGNOSIS — E782 Mixed hyperlipidemia: Secondary | ICD-10-CM | POA: Diagnosis not present

## 2018-01-29 DIAGNOSIS — E138 Other specified diabetes mellitus with unspecified complications: Secondary | ICD-10-CM | POA: Diagnosis not present

## 2018-01-29 DIAGNOSIS — M7989 Other specified soft tissue disorders: Secondary | ICD-10-CM | POA: Diagnosis not present

## 2018-01-29 DIAGNOSIS — L989 Disorder of the skin and subcutaneous tissue, unspecified: Secondary | ICD-10-CM

## 2018-01-29 DIAGNOSIS — D649 Anemia, unspecified: Secondary | ICD-10-CM

## 2018-01-29 DIAGNOSIS — I10 Essential (primary) hypertension: Secondary | ICD-10-CM | POA: Diagnosis not present

## 2018-01-29 DIAGNOSIS — G609 Hereditary and idiopathic neuropathy, unspecified: Secondary | ICD-10-CM | POA: Diagnosis not present

## 2018-01-29 IMAGING — DX DG TOE 3RD 2+V*R*
3 series · 3 of 3 positions shown · non-contrast
Comparison: None.

CLINICAL DATA: Sore on toe.  Third toe dark and swollen.

EXAM:
RIGHT THIRD TOE

[toe ap]
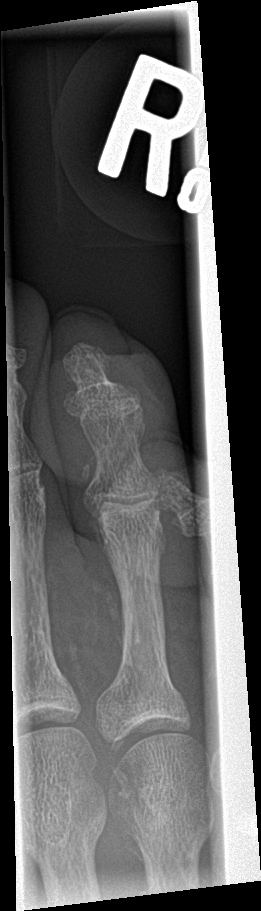

[toe obl]
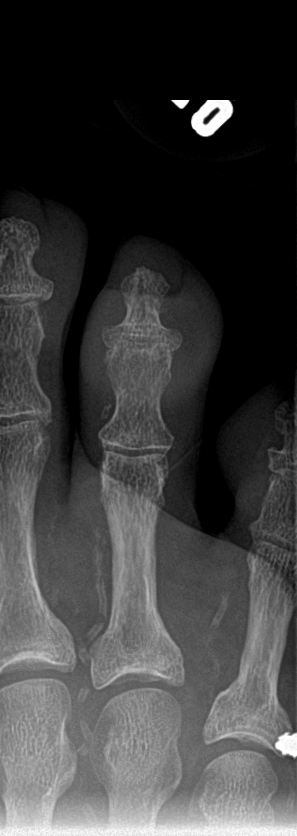

[toe lat]
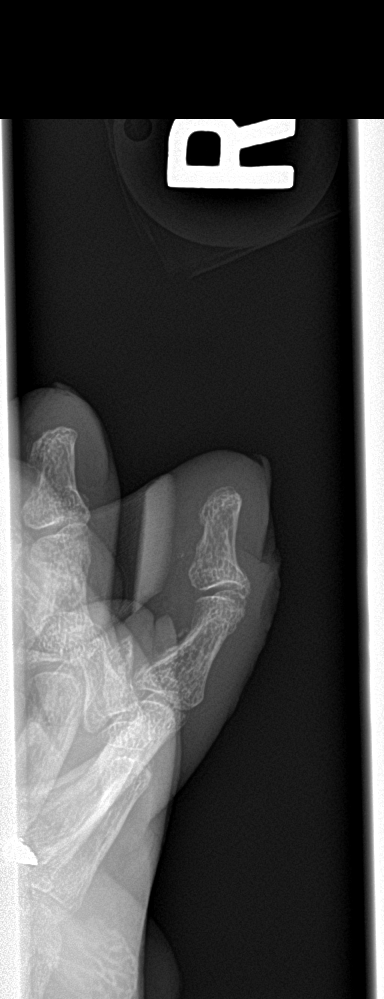

[3 of 3 positions shown; findings below may reference images not displayed]

FINDINGS: Extensive soft tissue swelling is present about the third digit.
There is soft tissue swelling and a probable laceration along the
dorsal aspect of the toe at the level of the DIP joint. No acute
osseous abnormality is present below this level. No radiopaque
foreign body is present.
IMPRESSION: 1. Soft tissue swelling and dorsal wound of the third toe without
acute or focal osseous abnormality.
2. Microvascular calcifications consistent with known diabetes.

## 2018-01-29 MED ORDER — CEFDINIR 300 MG PO CAPS: 300.0000 mg | ORAL_CAPSULE | Freq: Two times a day (BID) | ORAL | 0 refills | Status: AC

## 2018-01-29 MED ORDER — GABAPENTIN 100 MG PO CAPS: ORAL_CAPSULE | ORAL | 2 refills | Status: DC

## 2018-01-29 MED ORDER — SPIRONOLACTONE 25 MG PO TABS: 25.0000 mg | ORAL_TABLET | Freq: Every day | ORAL | 5 refills | Status: DC

## 2018-01-29 NOTE — Assessment & Plan Note (Signed)
Encouraged heart healthy diet, increase exercise, avoid trans fats, consider a krill oil cap daily 

## 2018-01-29 NOTE — Assessment & Plan Note (Signed)
Not welll controlled, no changes to meds. Encouraged heart healthy diet such as the DASH diet and exercise as tolerated.

## 2018-01-29 NOTE — Assessment & Plan Note (Signed)
Increase leafy greens, consider increased lean red meat and using cast iron cookware. Continue to monitor, report any concerns 

## 2018-01-29 NOTE — Patient Instructions (Signed)

## 2018-01-30 ENCOUNTER — Other Ambulatory Visit (INDEPENDENT_AMBULATORY_CARE_PROVIDER_SITE_OTHER): Payer: Medicare HMO

## 2018-01-30 ENCOUNTER — Other Ambulatory Visit: Payer: Self-pay | Admitting: Family Medicine

## 2018-01-30 DIAGNOSIS — E782 Mixed hyperlipidemia: Secondary | ICD-10-CM | POA: Diagnosis not present

## 2018-01-30 DIAGNOSIS — I10 Essential (primary) hypertension: Secondary | ICD-10-CM

## 2018-01-30 DIAGNOSIS — D649 Anemia, unspecified: Secondary | ICD-10-CM

## 2018-01-30 DIAGNOSIS — E138 Other specified diabetes mellitus with unspecified complications: Secondary | ICD-10-CM

## 2018-01-30 LAB — COMPREHENSIVE METABOLIC PANEL
ALBUMIN: 3.7 g/dL (ref 3.5–5.2)
ALT: 12 U/L (ref 0–53)
AST: 15 U/L (ref 0–37)
Alkaline Phosphatase: 63 U/L (ref 39–117)
BILIRUBIN TOTAL: 0.7 mg/dL (ref 0.2–1.2)
BUN: 33 mg/dL — AB (ref 6–23)
CHLORIDE: 107 meq/L (ref 96–112)
CO2: 22 meq/L (ref 19–32)
CREATININE: 1.78 mg/dL — AB (ref 0.40–1.50)
Calcium: 8.9 mg/dL (ref 8.4–10.5)
GFR: 48.18 mL/min — ABNORMAL LOW (ref >60.00)
Glucose, Bld: 151 mg/dL — ABNORMAL HIGH (ref 70–99)
Potassium: 4.1 mEq/L (ref 3.5–5.1)
SODIUM: 139 meq/L (ref 135–145)
Total Protein: 7.5 g/dL (ref 6.0–8.3)

## 2018-01-30 LAB — CBC WITH DIFFERENTIAL/PLATELET
BASOS ABS: 0 10*3/uL (ref 0.0–0.1)
BASOS PCT: 0.7 % (ref 0.0–3.0)
Eosinophils Absolute: 0.5 10*3/uL (ref 0.0–0.7)
Eosinophils Relative: 7.7 % — ABNORMAL HIGH (ref 0.0–5.0)
HEMATOCRIT: 37.8 % — AB (ref 39.0–52.0)
HEMOGLOBIN: 12.6 g/dL — AB (ref 13.0–17.0)
LYMPHS ABS: 1.4 10*3/uL (ref 0.7–4.0)
Lymphocytes Relative: 21.9 % (ref 12.0–46.0)
MCHC: 33.5 g/dL (ref 30.0–36.0)
MCV: 82.6 fl (ref 78.0–100.0)
Monocytes Absolute: 0.6 10*3/uL (ref 0.1–1.0)
Monocytes Relative: 9.2 % (ref 3.0–12.0)
NEUTROS ABS: 3.8 10*3/uL (ref 1.4–7.7)
NEUTROS PCT: 60.5 % (ref 43.0–77.0)
Platelets: 231 10*3/uL (ref 150.0–400.0)
RBC: 4.58 Mil/uL (ref 4.22–5.81)
RDW: 13.7 % (ref 11.5–15.5)
WBC: 6.3 10*3/uL (ref 4.0–10.5)

## 2018-01-30 LAB — HEMOGLOBIN A1C: Hgb A1c MFr Bld: 9.1 % — ABNORMAL HIGH (ref 4.6–6.5)

## 2018-01-30 LAB — LIPID PANEL
CHOLESTEROL: 211 mg/dL — AB (ref 0–200)
HDL: 32.2 mg/dL — ABNORMAL LOW (ref >39.00)
LDL CALC: 152 mg/dL — AB (ref 0–99)
NonHDL: 178.34
Total CHOL/HDL Ratio: 7
Triglycerides: 133 mg/dL (ref 0.0–149.0)
VLDL: 26.6 mg/dL (ref 0.0–40.0)

## 2018-01-30 LAB — TSH: TSH: 1.64 u[IU]/mL (ref 0.35–4.50)

## 2018-02-04 DIAGNOSIS — L0390 Cellulitis, unspecified: Secondary | ICD-10-CM | POA: Insufficient documentation
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
## 2018-02-04 NOTE — Assessment & Plan Note (Signed)
Started on antibiotics and report if symptoms do not improve.

## 2018-02-07 ENCOUNTER — Telehealth: Payer: Self-pay | Admitting: Family Medicine

## 2018-02-07 NOTE — Telephone Encounter (Unsigned)
Copied from [place] [id number]. Topic: Quick Communication - See Telephone Encounter >> Feb 07, 2018  4:34 PM Percell Belt A wrote: CRM for notification. See Telephone encounter for: 02/07/18.  Pt called in and would like someone to call him with the 8/28 lab results.    Best number  [phone]

## 2018-02-08 NOTE — Telephone Encounter (Signed)
Spoke with patient about lab results.

## 2018-02-11 ENCOUNTER — Encounter: Payer: Self-pay | Admitting: Endocrinology

## 2018-02-11 ENCOUNTER — Ambulatory Visit: Payer: Medicare HMO | Admitting: Endocrinology

## 2018-02-11 DIAGNOSIS — L97519 Non-pressure chronic ulcer of other part of right foot with unspecified severity: Secondary | ICD-10-CM | POA: Diagnosis not present

## 2018-02-11 MED ORDER — INSULIN NPH ISOPHANE & REGULAR (70-30) 100 UNIT/ML ~~LOC~~ SUSP: SUBCUTANEOUS | 2 refills | Status: DC
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
## 2018-02-11 NOTE — Patient Instructions (Addendum)
check your blood sugar twice a day.  vary the time of day when you check, between before the 3 meals, and at bedtime.  also check if you have symptoms of your blood sugar being too high or too low.  please keep a record of the readings and bring it to your next appointment here (or you can bring the meter itself).  You can write it on any piece of paper.  please call us sooner if your blood sugar goes below 70, or if you have a lot of readings over 200.   Please increase the 70/30 insulin to 36 units with breakfast, and 8 units with the evening meal.  Please take this, no matter what your blood sugar is.   On this type of insulin schedule, you should eat meals on a regular schedule (especially lunch).  If a meal is missed or significantly delayed, your blood sugar could go low.  Let's recheck the circulation test.  you will receive a phone call, about a day and time for an appointment.   Please come back for a follow-up appointment in 2 months.

## 2018-02-14 ENCOUNTER — Encounter (HOSPITAL_COMMUNITY): Payer: Medicare HMO

## 2018-02-15 ENCOUNTER — Encounter (HOSPITAL_COMMUNITY): Payer: Medicare HMO

## 2018-02-20 ENCOUNTER — Ambulatory Visit (HOSPITAL_COMMUNITY)
Admission: RE | Admit: 2018-02-20 | Discharge: 2018-02-20 | Disposition: A | Discharge status: Home / Self Care | Payer: Medicare HMO | Source: Ambulatory Visit | Attending: Cardiovascular Disease | Admitting: Cardiovascular Disease

## 2018-02-20 DIAGNOSIS — L97519 Non-pressure chronic ulcer of other part of right foot with unspecified severity: Secondary | ICD-10-CM | POA: Diagnosis not present

## 2018-02-24 ENCOUNTER — Other Ambulatory Visit: Payer: Self-pay | Admitting: Endocrinology

## 2018-02-24 DIAGNOSIS — E1151 Type 2 diabetes mellitus with diabetic peripheral angiopathy without gangrene: Secondary | ICD-10-CM

## 2018-03-01 ENCOUNTER — Ambulatory Visit: Payer: Medicare HMO

## 2018-03-09 ENCOUNTER — Other Ambulatory Visit: Payer: Self-pay

## 2018-03-09 ENCOUNTER — Emergency Department (HOSPITAL_COMMUNITY): Payer: Medicare HMO

## 2018-03-09 ENCOUNTER — Encounter (HOSPITAL_COMMUNITY): Payer: Self-pay

## 2018-03-09 ENCOUNTER — Inpatient Hospital Stay (HOSPITAL_COMMUNITY)
Admission: EM | Admit: 2018-03-09 | Discharge: 2018-03-14 | DRG: 603 | Disposition: A | Discharge status: Skilled Nursing Facility | Payer: Medicare HMO | Attending: Internal Medicine | Admitting: Internal Medicine

## 2018-03-09 DIAGNOSIS — M6281 Muscle weakness (generalized): Secondary | ICD-10-CM | POA: Diagnosis not present

## 2018-03-09 DIAGNOSIS — T465X5A Adverse effect of other antihypertensive drugs, initial encounter: Secondary | ICD-10-CM | POA: Diagnosis present

## 2018-03-09 DIAGNOSIS — E860 Dehydration: Secondary | ICD-10-CM | POA: Diagnosis present

## 2018-03-09 DIAGNOSIS — Z820 Family history of epilepsy and other diseases of the nervous system: Secondary | ICD-10-CM

## 2018-03-09 DIAGNOSIS — N179 Acute kidney failure, unspecified: Secondary | ICD-10-CM | POA: Diagnosis not present

## 2018-03-09 DIAGNOSIS — Z807 Family history of other malignant neoplasms of lymphoid, hematopoietic and related tissues: Secondary | ICD-10-CM | POA: Diagnosis not present

## 2018-03-09 DIAGNOSIS — R4182 Altered mental status, unspecified: Secondary | ICD-10-CM | POA: Diagnosis not present

## 2018-03-09 DIAGNOSIS — L03211 Cellulitis of face: Principal | ICD-10-CM | POA: Diagnosis present

## 2018-03-09 DIAGNOSIS — Z823 Family history of stroke: Secondary | ICD-10-CM | POA: Diagnosis not present

## 2018-03-09 DIAGNOSIS — Z9841 Cataract extraction status, right eye: Secondary | ICD-10-CM

## 2018-03-09 DIAGNOSIS — N390 Urinary tract infection, site not specified: Secondary | ICD-10-CM | POA: Diagnosis not present

## 2018-03-09 DIAGNOSIS — E1122 Type 2 diabetes mellitus with diabetic chronic kidney disease: Secondary | ICD-10-CM | POA: Diagnosis not present

## 2018-03-09 DIAGNOSIS — M545 Low back pain: Secondary | ICD-10-CM | POA: Diagnosis present

## 2018-03-09 DIAGNOSIS — J9811 Atelectasis: Secondary | ICD-10-CM | POA: Diagnosis not present

## 2018-03-09 DIAGNOSIS — H538 Other visual disturbances: Secondary | ICD-10-CM | POA: Diagnosis not present

## 2018-03-09 DIAGNOSIS — I129 Hypertensive chronic kidney disease with stage 1 through stage 4 chronic kidney disease, or unspecified chronic kidney disease: Secondary | ICD-10-CM | POA: Diagnosis not present

## 2018-03-09 DIAGNOSIS — E11621 Type 2 diabetes mellitus with foot ulcer: Secondary | ICD-10-CM | POA: Diagnosis not present

## 2018-03-09 DIAGNOSIS — R41841 Cognitive communication deficit: Secondary | ICD-10-CM | POA: Diagnosis not present

## 2018-03-09 DIAGNOSIS — I160 Hypertensive urgency: Secondary | ICD-10-CM | POA: Diagnosis not present

## 2018-03-09 DIAGNOSIS — Z9842 Cataract extraction status, left eye: Secondary | ICD-10-CM

## 2018-03-09 DIAGNOSIS — B964 Proteus (mirabilis) (morganii) as the cause of diseases classified elsewhere: Secondary | ICD-10-CM | POA: Diagnosis not present

## 2018-03-09 DIAGNOSIS — Z833 Family history of diabetes mellitus: Secondary | ICD-10-CM

## 2018-03-09 DIAGNOSIS — E1139 Type 2 diabetes mellitus with other diabetic ophthalmic complication: Secondary | ICD-10-CM | POA: Diagnosis not present

## 2018-03-09 DIAGNOSIS — Z8249 Family history of ischemic heart disease and other diseases of the circulatory system: Secondary | ICD-10-CM

## 2018-03-09 DIAGNOSIS — R509 Fever, unspecified: Secondary | ICD-10-CM | POA: Diagnosis present

## 2018-03-09 DIAGNOSIS — N183 Chronic kidney disease, stage 3 (moderate): Secondary | ICD-10-CM | POA: Diagnosis present

## 2018-03-09 DIAGNOSIS — Z7982 Long term (current) use of aspirin: Secondary | ICD-10-CM | POA: Diagnosis not present

## 2018-03-09 DIAGNOSIS — E1165 Type 2 diabetes mellitus with hyperglycemia: Secondary | ICD-10-CM | POA: Diagnosis not present

## 2018-03-09 DIAGNOSIS — Z794 Long term (current) use of insulin: Secondary | ICD-10-CM

## 2018-03-09 DIAGNOSIS — R079 Chest pain, unspecified: Secondary | ICD-10-CM | POA: Diagnosis not present

## 2018-03-09 DIAGNOSIS — E1140 Type 2 diabetes mellitus with diabetic neuropathy, unspecified: Secondary | ICD-10-CM | POA: Diagnosis not present

## 2018-03-09 DIAGNOSIS — E1151 Type 2 diabetes mellitus with diabetic peripheral angiopathy without gangrene: Secondary | ICD-10-CM | POA: Diagnosis not present

## 2018-03-09 DIAGNOSIS — J019 Acute sinusitis, unspecified: Secondary | ICD-10-CM | POA: Diagnosis not present

## 2018-03-09 DIAGNOSIS — L97519 Non-pressure chronic ulcer of other part of right foot with unspecified severity: Secondary | ICD-10-CM | POA: Diagnosis not present

## 2018-03-09 DIAGNOSIS — I10 Essential (primary) hypertension: Secondary | ICD-10-CM

## 2018-03-09 DIAGNOSIS — R531 Weakness: Secondary | ICD-10-CM | POA: Diagnosis not present

## 2018-03-09 DIAGNOSIS — Z87891 Personal history of nicotine dependence: Secondary | ICD-10-CM

## 2018-03-09 DIAGNOSIS — Z885 Allergy status to narcotic agent status: Secondary | ICD-10-CM

## 2018-03-09 DIAGNOSIS — Z8261 Family history of arthritis: Secondary | ICD-10-CM | POA: Diagnosis not present

## 2018-03-09 DIAGNOSIS — M25871 Other specified joint disorders, right ankle and foot: Secondary | ICD-10-CM | POA: Diagnosis not present

## 2018-03-09 DIAGNOSIS — R222 Localized swelling, mass and lump, trunk: Secondary | ICD-10-CM | POA: Diagnosis not present

## 2018-03-09 DIAGNOSIS — E782 Mixed hyperlipidemia: Secondary | ICD-10-CM | POA: Diagnosis not present

## 2018-03-09 DIAGNOSIS — R609 Edema, unspecified: Secondary | ICD-10-CM | POA: Diagnosis not present

## 2018-03-09 LAB — URINALYSIS, ROUTINE W REFLEX MICROSCOPIC
Bacteria, UA: NONE SEEN
Bilirubin Urine: NEGATIVE
Glucose, UA: >=500 mg/dL — AB
Ketones, ur: NEGATIVE mg/dL
NITRITE: NEGATIVE
Protein, ur: 100 mg/dL — AB
RBC / HPF: >50 RBC/hpf — ABNORMAL HIGH (ref 0–5)
Specific Gravity, Urine: 1.009 (ref 1.005–1.030)
WBC, UA: >50 WBC/hpf — ABNORMAL HIGH (ref 0–5)
pH: 5 (ref 5.0–8.0)

## 2018-03-09 LAB — BLOOD GAS, VENOUS
Acid-base deficit: 6.9 mmol/L — ABNORMAL HIGH (ref 0.0–2.0)
Bicarbonate: 18.8 mmol/L — ABNORMAL LOW (ref 20.0–28.0)
FIO2: 21
O2 SAT: 36.1 %
PATIENT TEMPERATURE: 98.6
pCO2, Ven: 40.1 mmHg — ABNORMAL LOW (ref 44.0–60.0)
pH, Ven: 7.292 (ref 7.250–7.430)

## 2018-03-09 LAB — COMPREHENSIVE METABOLIC PANEL
ALK PHOS: 76 U/L (ref 38–126)
ALT: 26 U/L (ref 0–44)
AST: 52 U/L — ABNORMAL HIGH (ref 15–41)
Albumin: 3 g/dL — ABNORMAL LOW (ref 3.5–5.0)
Anion gap: 10 (ref 5–15)
BILIRUBIN TOTAL: 0.7 mg/dL (ref 0.3–1.2)
BUN: 81 mg/dL — ABNORMAL HIGH (ref 8–23)
CO2: 20 mmol/L — ABNORMAL LOW (ref 22–32)
Calcium: 9.3 mg/dL (ref 8.9–10.3)
Chloride: 110 mmol/L (ref 98–111)
Creatinine, Ser: 4.14 mg/dL — ABNORMAL HIGH (ref 0.61–1.24)
GFR, EST AFRICAN AMERICAN: 15 mL/min — AB (ref >60)
GFR, EST NON AFRICAN AMERICAN: 13 mL/min — AB (ref >60)
Glucose, Bld: 339 mg/dL — ABNORMAL HIGH (ref 70–99)
Potassium: 4.8 mmol/L (ref 3.5–5.1)
Sodium: 140 mmol/L (ref 135–145)
TOTAL PROTEIN: 8.5 g/dL — AB (ref 6.5–8.1)

## 2018-03-09 LAB — CBC WITH DIFFERENTIAL/PLATELET
Basophils Absolute: 0 10*3/uL (ref 0.0–0.1)
Basophils Relative: 0 %
EOS ABS: 0.2 10*3/uL (ref 0.0–0.7)
Eosinophils Relative: 1 %
HEMATOCRIT: 35.6 % — AB (ref 39.0–52.0)
Hemoglobin: 12.2 g/dL — ABNORMAL LOW (ref 13.0–17.0)
LYMPHS ABS: 0.8 10*3/uL (ref 0.7–4.0)
Lymphocytes Relative: 5 %
MCH: 28 pg (ref 26.0–34.0)
MCHC: 34.3 g/dL (ref 30.0–36.0)
MCV: 81.7 fL (ref 78.0–100.0)
MONOS PCT: 10 %
Monocytes Absolute: 1.7 10*3/uL — ABNORMAL HIGH (ref 0.1–1.0)
NEUTROS ABS: 14 10*3/uL — AB (ref 1.7–7.7)
Neutrophils Relative %: 84 %
Platelets: 339 10*3/uL (ref 150–400)
RBC: 4.36 MIL/uL (ref 4.22–5.81)
RDW: 13.4 % (ref 11.5–15.5)
WBC: 16.7 10*3/uL — ABNORMAL HIGH (ref 4.0–10.5)

## 2018-03-09 IMAGING — CT CT CHEST W/O CM
1 of 11 series · 14 of 37 positions shown, 18 images · non-contrast
Comparison: Chest radiograph earlier this day.

CLINICAL DATA: Lung nodule, <1cm, mod-high risk

EXAM:
CT CHEST WITHOUT CONTRAST
TECHNIQUE: Multidetector CT imaging of the chest was performed following the
standard protocol without IV contrast. Performed in conjunction with
CT of the head and face.

[Series 3: thorax · axial · 0.73mm/px · z∈[-466,-230]mm · 14 of 138 slices shown, 18 images]
[im 10/138  mediastinal]
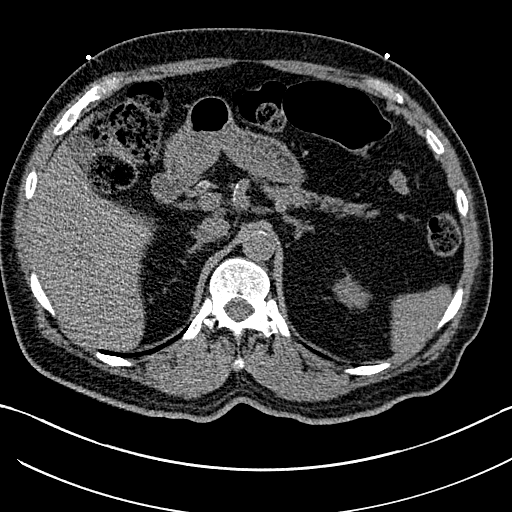
[im 10/138  lung]
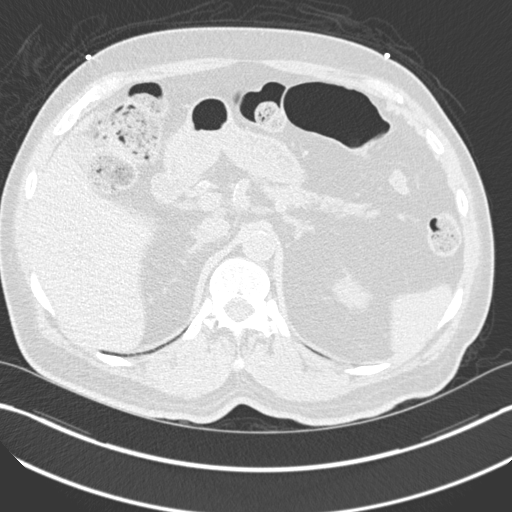
[im 19/138  lung]
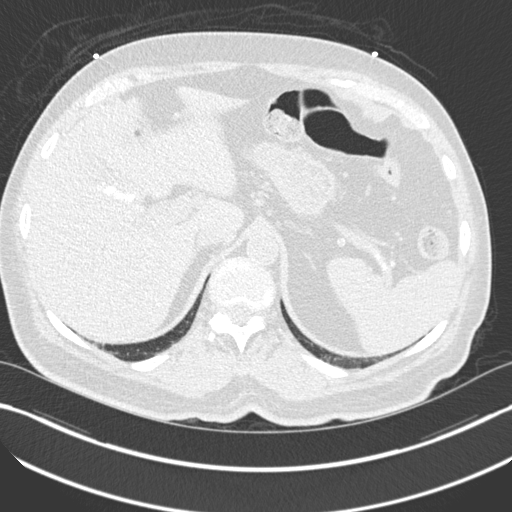
[im 28/138  lung]
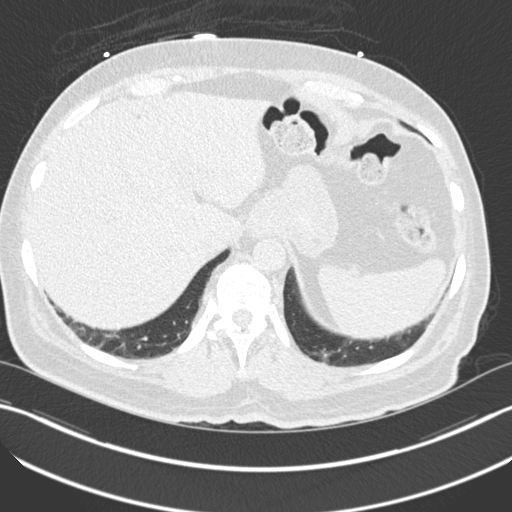
[im 37/138  lung]
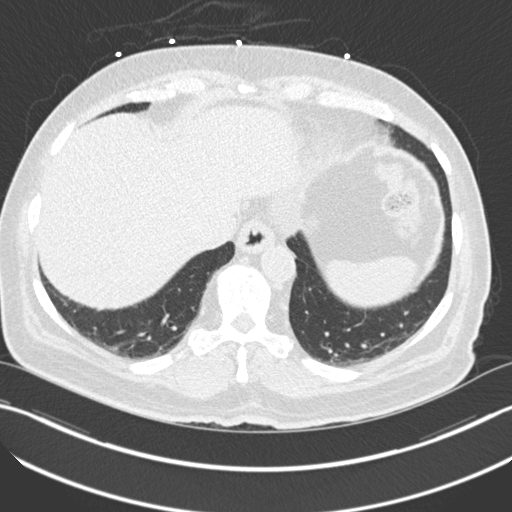
[im 46/138  mediastinal]
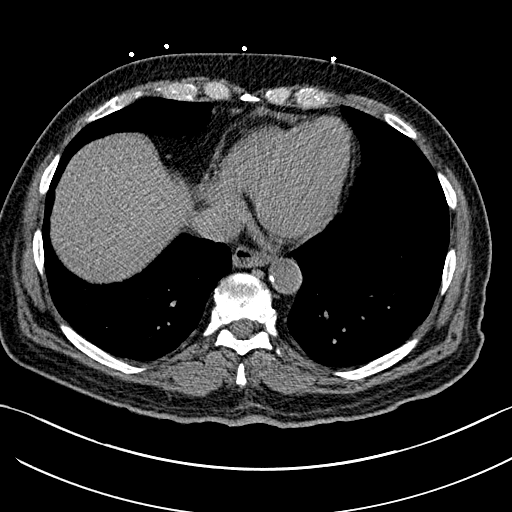
[im 46/138  lung]
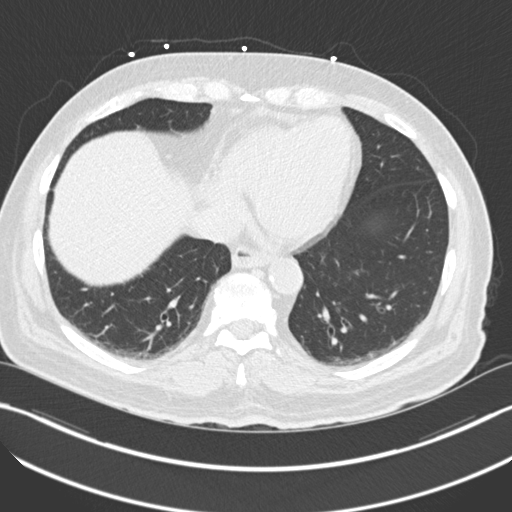
[im 55/138  lung]
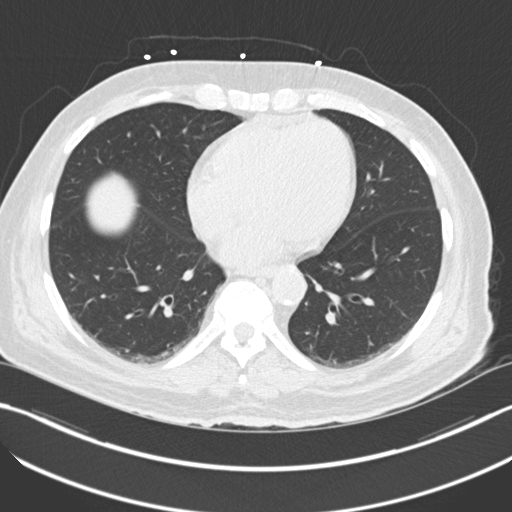
[im 64/138  lung]
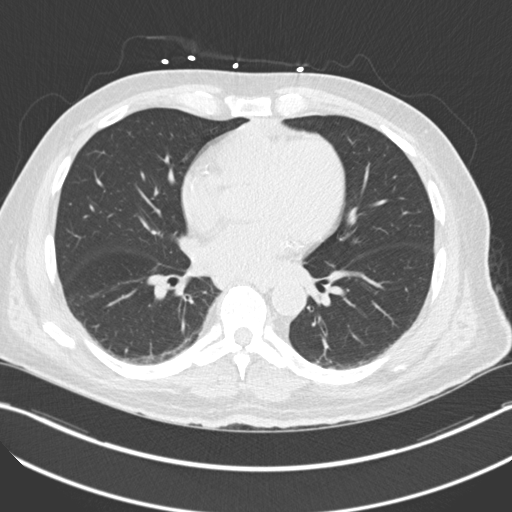
[im 74/138  lung]
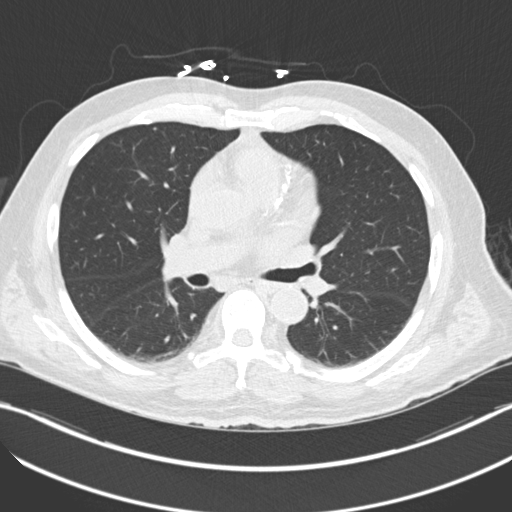
[im 83/138  mediastinal]
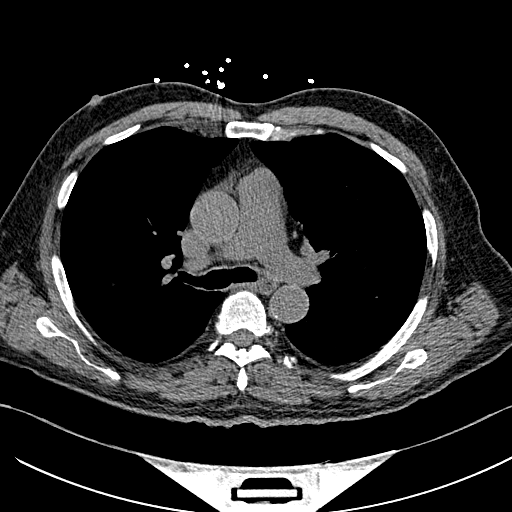
[im 83/138  lung]
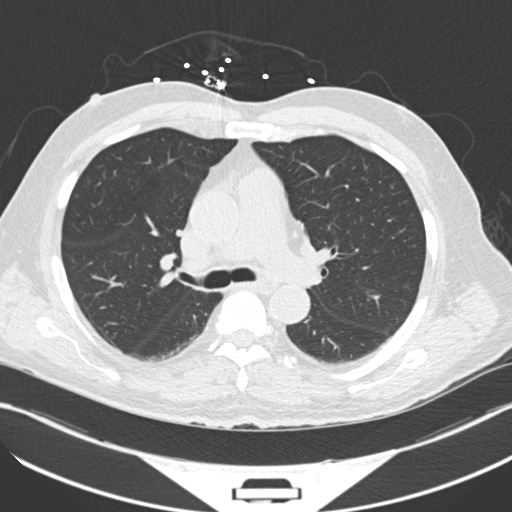
[im 92/138  lung]
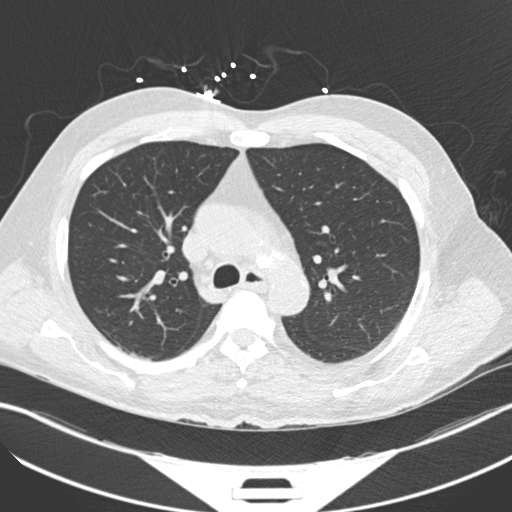
[im 101/138  lung]
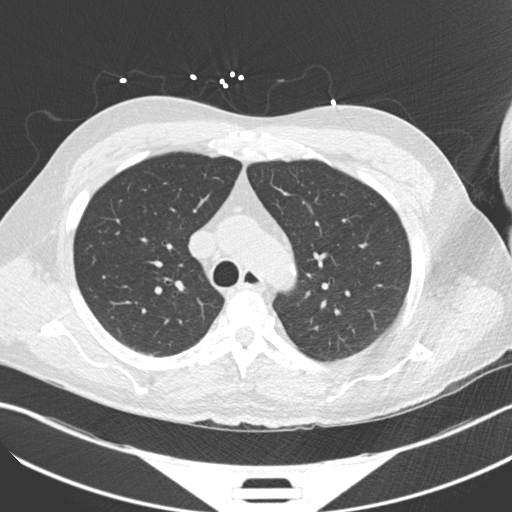
[im 110/138  lung]
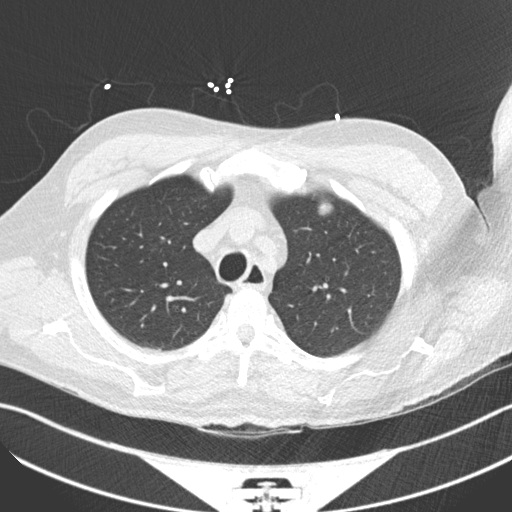
[im 119/138  mediastinal]
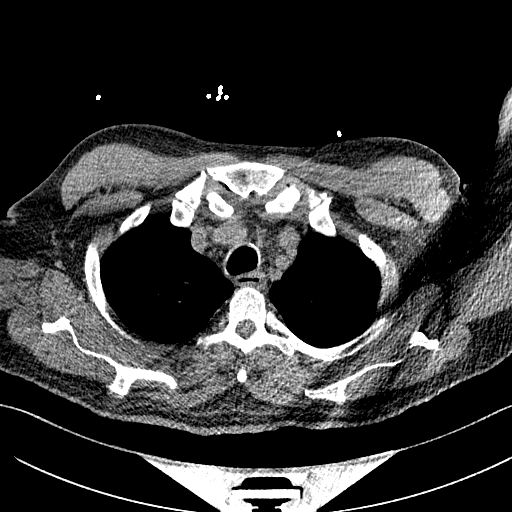
[im 119/138  lung]
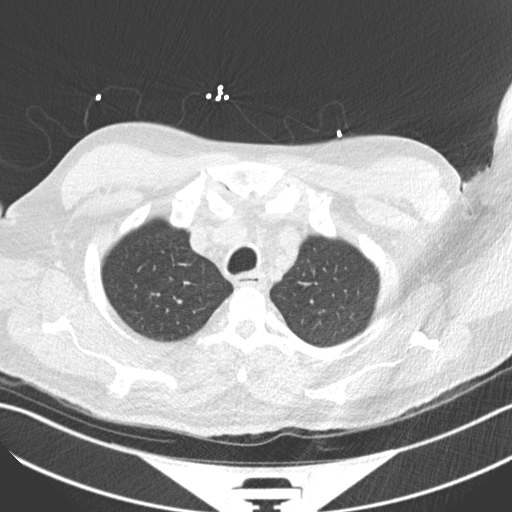
[im 128/138  lung]
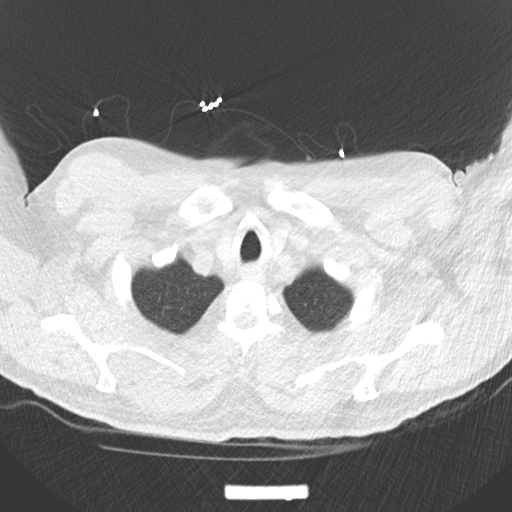

[14 of 37 positions shown; findings below may reference images not displayed]

FINDINGS: Cardiovascular: Aortic atherosclerosis and tortuosity. Heart is
normal in size. There are coronary artery calcifications. Small
amount pericardial fluid or thickening anteriorly.

Mediastinum/Nodes: Small mediastinal nodes not enlarged by size
criteria. No bulky hilar adenopathy, lack of IV contrast limits
assessment. Small hiatal hernia. No visualized thyroid nodule.

Lungs/Pleura: No pulmonary nodule, particularly no nodule at the
left lung apex to correspond to that questioned on radiograph. There
are subclavian artery vascular calcifications in the region of
question nodules, likely causing the appearance on radiograph. Minor
dependent atelectasis in both lower lobes. The lungs are otherwise
clear. No consolidation, pulmonary edema or pleural effusion.

Upper Abdomen: No acute findings.  Incidental gallstones.

Musculoskeletal: There are no acute or suspicious osseous
abnormalities.
IMPRESSION: 1. No pulmonary nodule. The area questioned on radiograph represents
calcification in the subclavian artery.
2. No acute chest finding.
3. Aortic Atherosclerosis ([1O]-[1O]). Coronary artery
calcifications.

## 2018-03-09 IMAGING — DX DG FOOT COMPLETE 3+V*R*
3 series · 3 of 3 positions shown · non-contrast
Comparison: None.

CLINICAL DATA: Evaluate for infection.

EXAM:
RIGHT FOOT COMPLETE - 3+ VIEW

[foot ap]
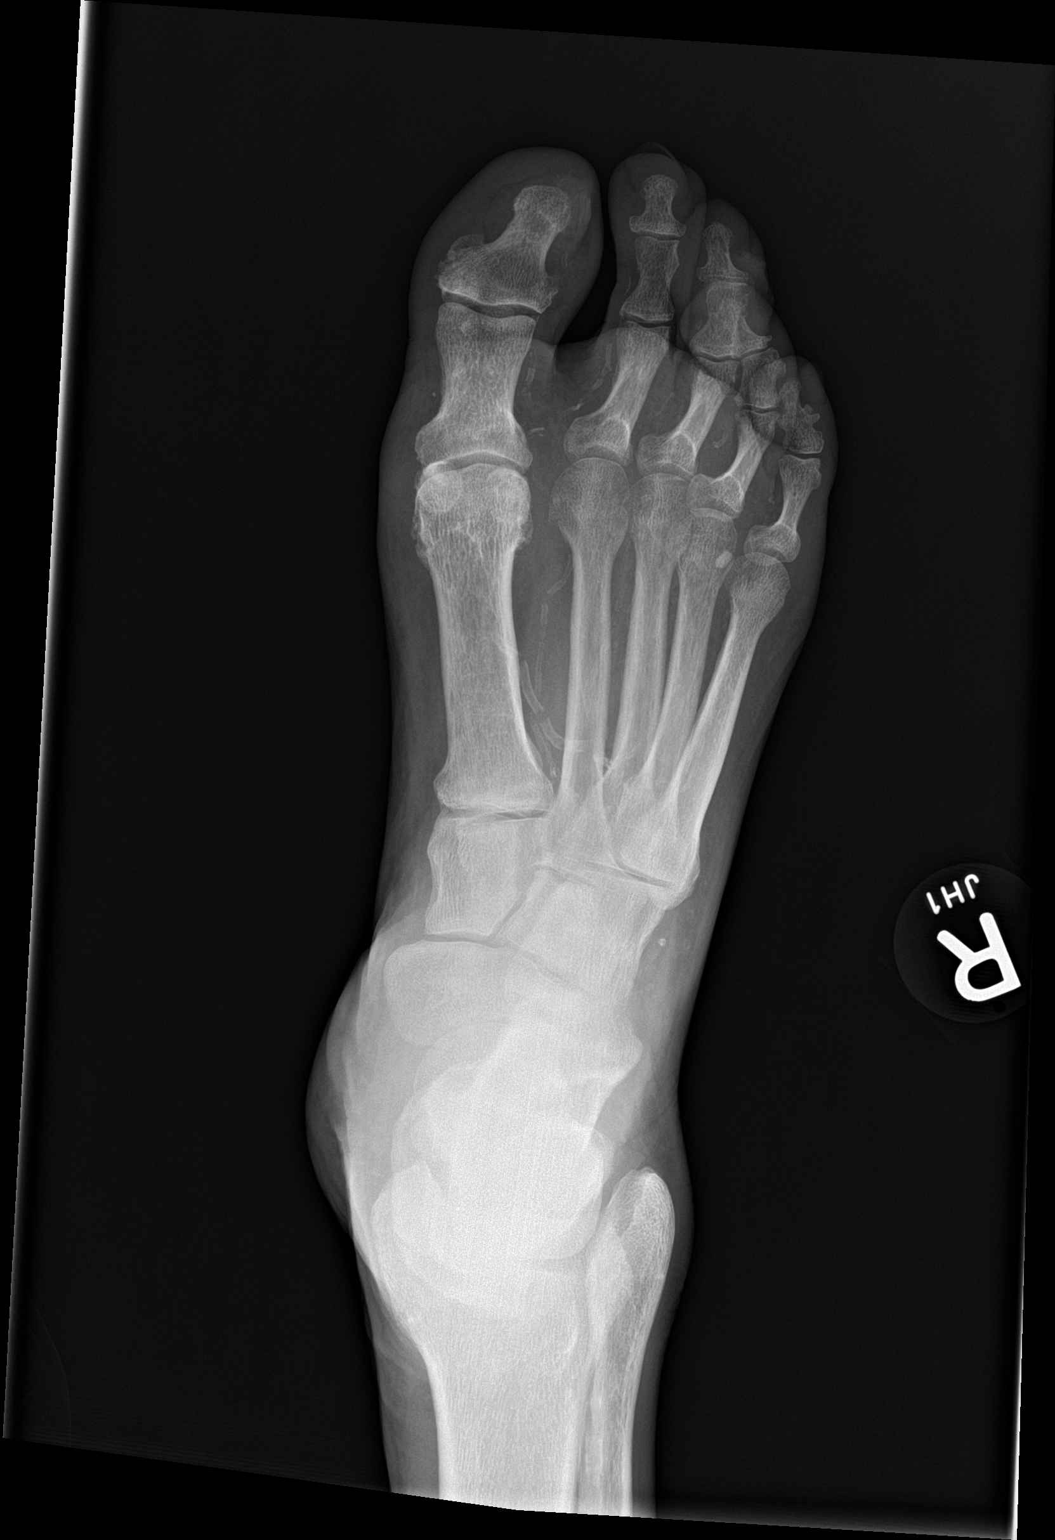

[foot obl]
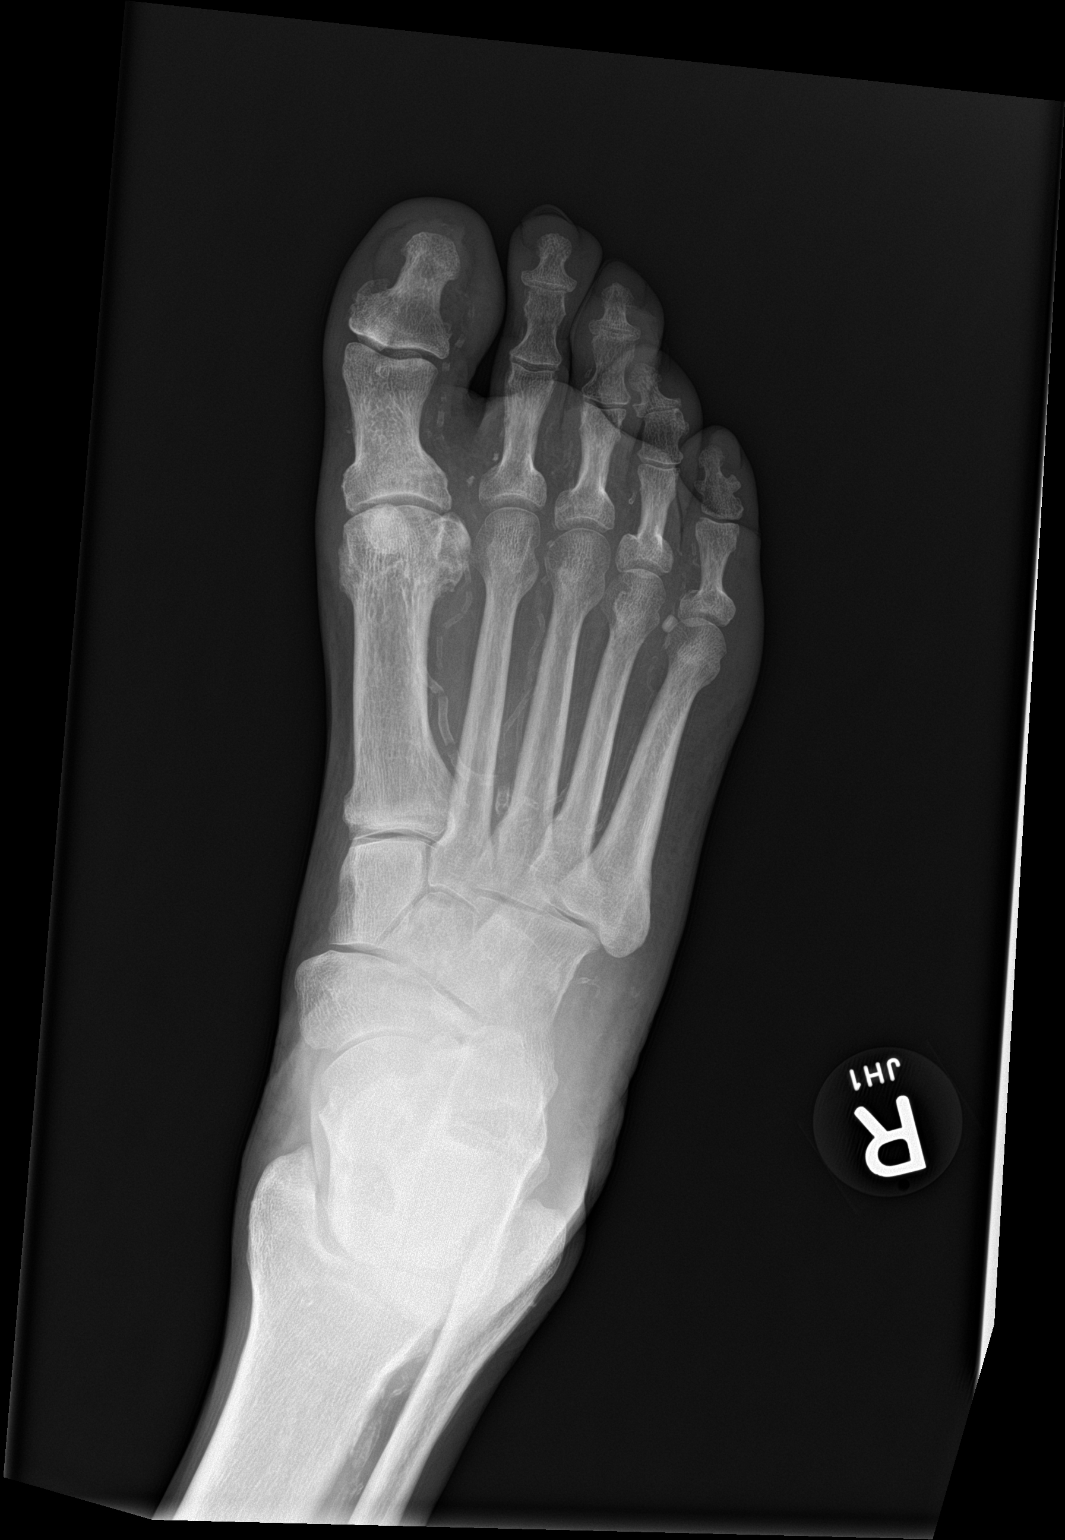

[foot lat]
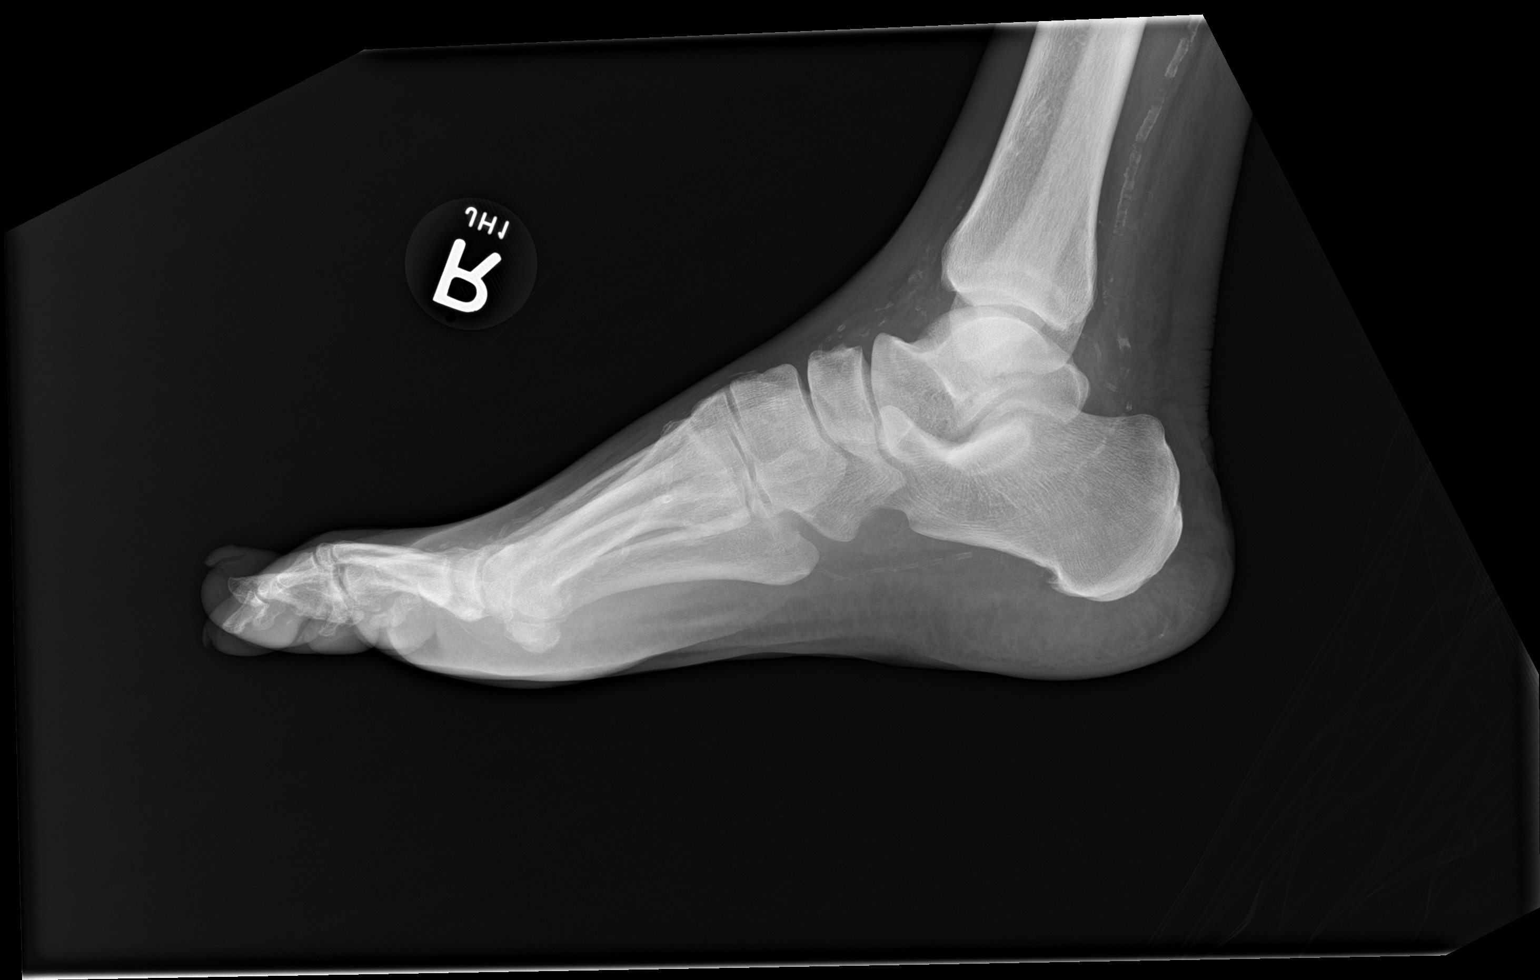

[3 of 3 positions shown; findings below may reference images not displayed]

FINDINGS: Vascular calcifications are noted. No fractures. No bony erosion to
suggest osteomyelitis.
IMPRESSION: Vascular calcifications.  No evidence of osteomyelitis.

## 2018-03-09 IMAGING — CR DG CHEST 2V
2 series · 2 of 2 positions shown · non-contrast
Comparison: [DATE]

CLINICAL DATA: Tooth abscess with worsening pain and swelling.

EXAM:
CHEST - 2 VIEW

[w chest lat]
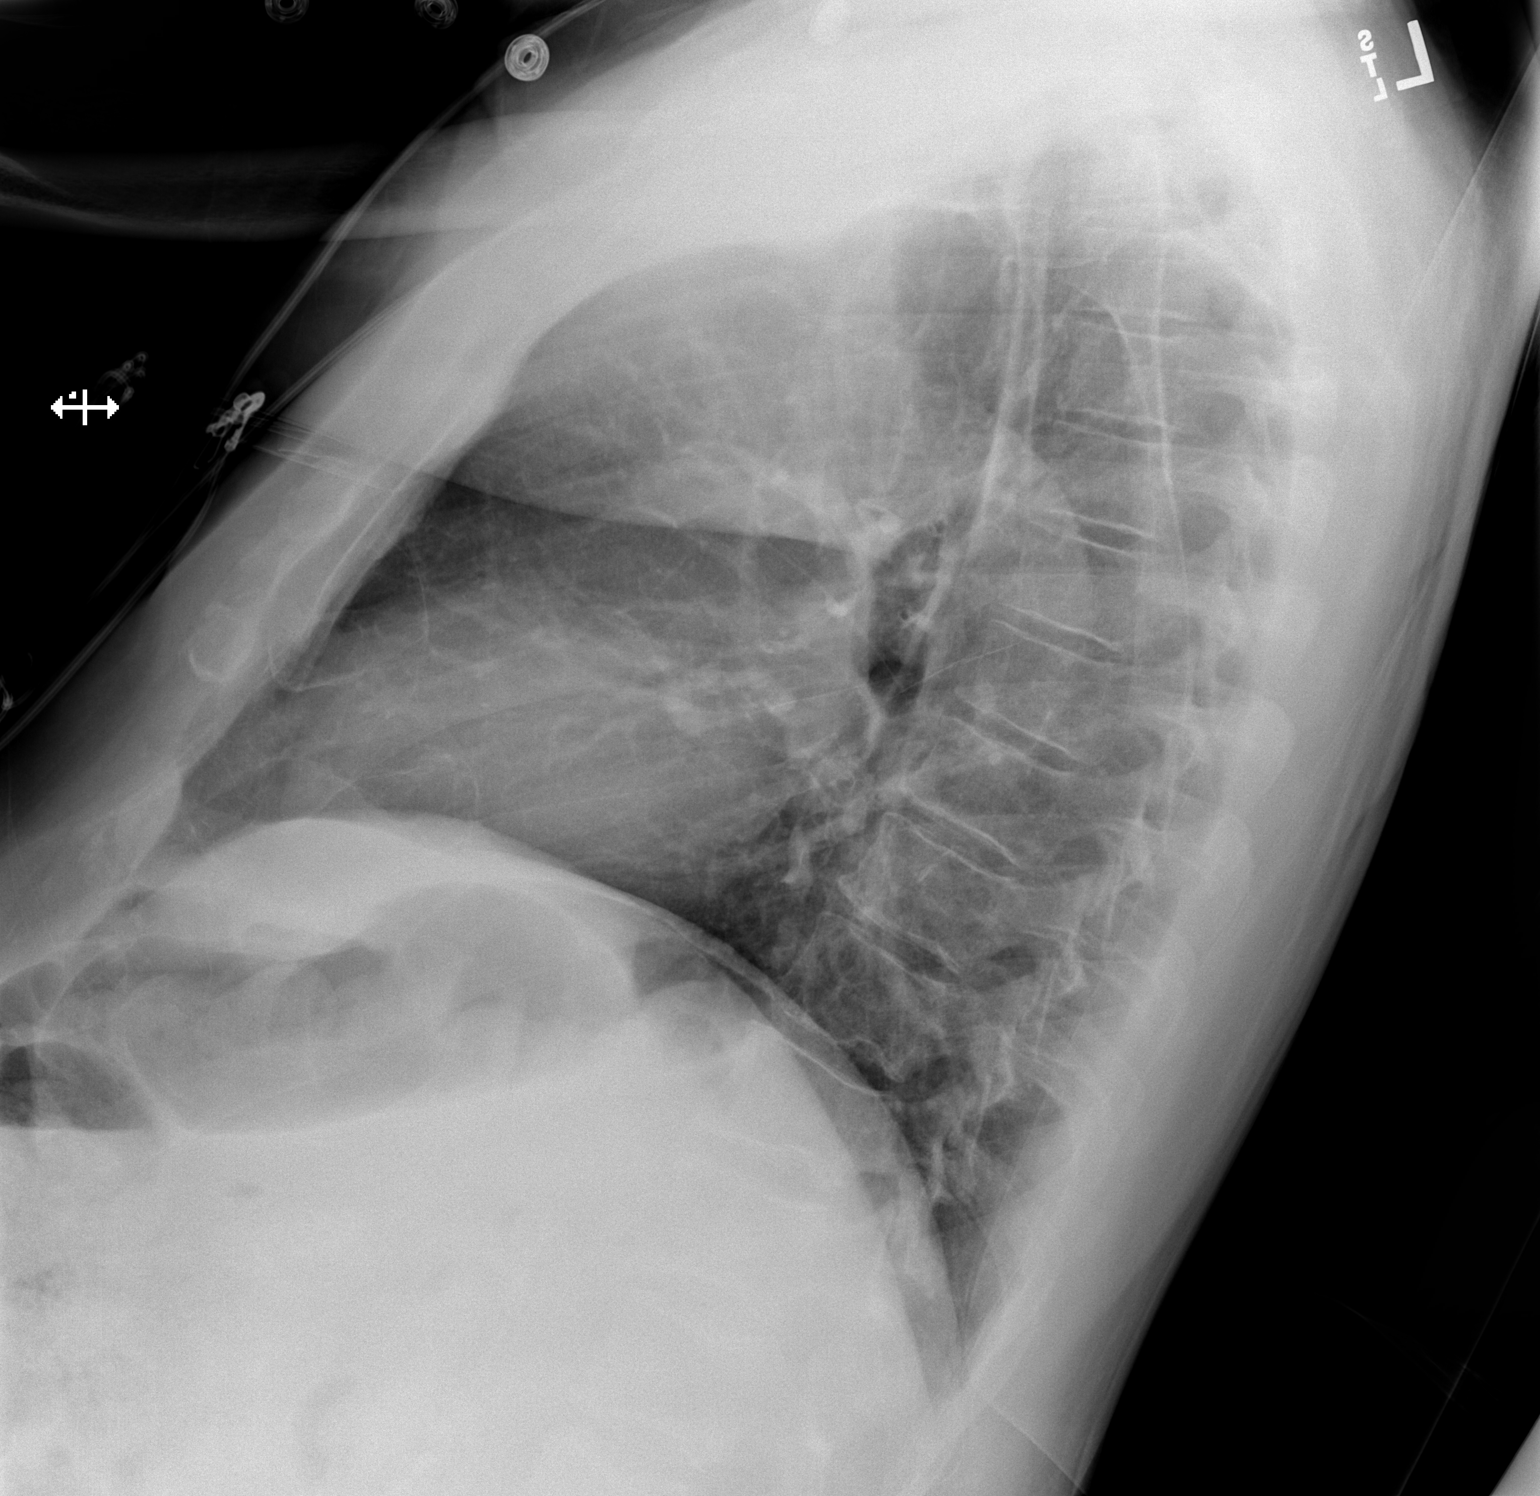

[x chest ap]
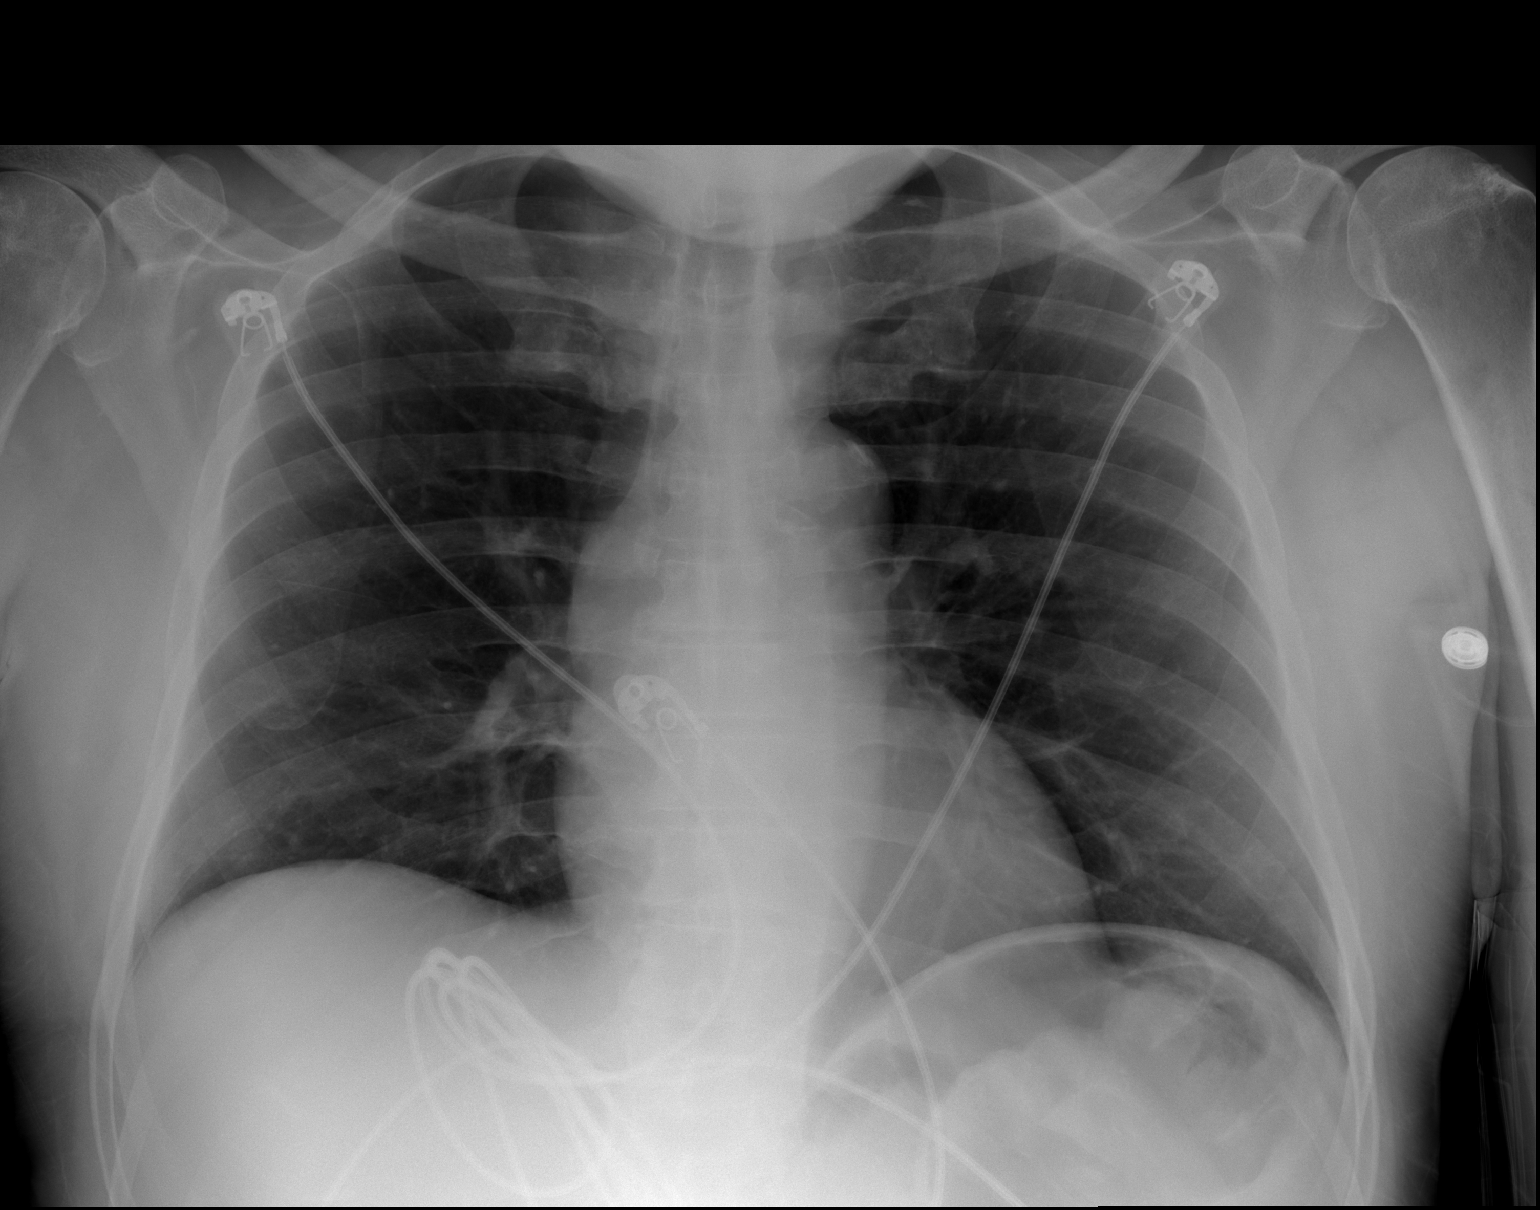

[2 of 2 positions shown; findings below may reference images not displayed]

FINDINGS: AP and lateral views of the chest obtained. The lungs are clear
without focal pneumonia, edema, pneumothorax or pleural effusion.
Tiny nodule identified over the left apex. The cardiopericardial
silhouette is within normal limits for size. The visualized bony
structures of the thorax are intact. Telemetry leads overlie the
chest.
IMPRESSION: Tiny nodular density over the left apex. CT chest without contrast
recommended to further evaluate.

No focal airspace consolidation or pulmonary edema. No pleural
effusion.

## 2018-03-09 MED ORDER — AMLODIPINE BESYLATE 5 MG PO TABS
10.0000 mg | ORAL_TABLET | Freq: Once | ORAL | Status: DC
  Administered 2018-03-09: 10 mg via ORAL

## 2018-03-09 MED ORDER — LACTATED RINGERS IV BOLUS
1000.0000 mL | Freq: Once | INTRAVENOUS | Status: AC
  Administered 2018-03-09: 1000 mL via INTRAVENOUS

## 2018-03-09 MED ORDER — SODIUM CHLORIDE 0.9 % IV SOLN
1.0000 g | INTRAVENOUS | Status: AC
  Administered 2018-03-09: 1 g via INTRAVENOUS
  Filled 2018-03-09: qty 1

## 2018-03-09 MED ORDER — FLUTICASONE PROPIONATE 50 MCG/ACT NA SUSP
2.0000 | Freq: Every day | NASAL | Status: DC
  Administered 2018-03-11 – 2018-03-12 (×2): 2 via NASAL
  Filled 2018-03-09: qty 16

## 2018-03-09 MED ORDER — VANCOMYCIN HCL 10 G IV SOLR
1750.0000 mg | Freq: Once | INTRAVENOUS | Status: AC
  Administered 2018-03-09: 1750 mg via INTRAVENOUS
  Filled 2018-03-09: qty 1750

## 2018-03-09 NOTE — ED Triage Notes (Signed)
Per EMS, pt coming from home c/o swelling of upper left face and nose making it difficult to breathe. Pt has hx of abscess tooth x1 week and has appointment with dentist next week but is concerned because of increased pain and swelling.  Fever of 101.8 temporal. 1000 mg of Tylenol given by EMS.  Increased weakness over the past few days. Reports increased difficulty ambulating with walker.

## 2018-03-09 NOTE — ED Notes (Signed)
Resp called for VBG
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
## 2018-03-09 NOTE — ED Notes (Signed)
Patient transported to X-ray 

## 2018-03-09 NOTE — ED Notes (Addendum)
Pt aware that urine sample is needed. Urine at bedside

## 2018-03-09 NOTE — ED Notes (Signed)
Urine and culture sent to lab  

## 2018-03-09 NOTE — ED Notes (Signed)
Bed: [id number] Expected date: 03/09/18 Expected time: 6:56 PM Means of arrival: Ambulance Comments: Fever, tooth abscess, Hyperglycemia

## 2018-03-10 ENCOUNTER — Other Ambulatory Visit: Payer: Self-pay

## 2018-03-10 ENCOUNTER — Inpatient Hospital Stay (HOSPITAL_COMMUNITY): Payer: Medicare HMO

## 2018-03-10 ENCOUNTER — Encounter (HOSPITAL_COMMUNITY): Payer: Self-pay

## 2018-03-10 DIAGNOSIS — I160 Hypertensive urgency: Secondary | ICD-10-CM

## 2018-03-10 DIAGNOSIS — E860 Dehydration: Secondary | ICD-10-CM | POA: Diagnosis present

## 2018-03-10 DIAGNOSIS — Z9842 Cataract extraction status, left eye: Secondary | ICD-10-CM | POA: Diagnosis not present

## 2018-03-10 DIAGNOSIS — I10 Essential (primary) hypertension: Secondary | ICD-10-CM | POA: Diagnosis not present

## 2018-03-10 DIAGNOSIS — Z9841 Cataract extraction status, right eye: Secondary | ICD-10-CM | POA: Diagnosis not present

## 2018-03-10 DIAGNOSIS — M545 Low back pain: Secondary | ICD-10-CM | POA: Diagnosis present

## 2018-03-10 DIAGNOSIS — I129 Hypertensive chronic kidney disease with stage 1 through stage 4 chronic kidney disease, or unspecified chronic kidney disease: Secondary | ICD-10-CM | POA: Diagnosis present

## 2018-03-10 DIAGNOSIS — Z7982 Long term (current) use of aspirin: Secondary | ICD-10-CM | POA: Diagnosis not present

## 2018-03-10 DIAGNOSIS — E1140 Type 2 diabetes mellitus with diabetic neuropathy, unspecified: Secondary | ICD-10-CM | POA: Diagnosis present

## 2018-03-10 DIAGNOSIS — B964 Proteus (mirabilis) (morganii) as the cause of diseases classified elsewhere: Secondary | ICD-10-CM | POA: Diagnosis present

## 2018-03-10 DIAGNOSIS — N179 Acute kidney failure, unspecified: Secondary | ICD-10-CM | POA: Diagnosis present

## 2018-03-10 DIAGNOSIS — N183 Chronic kidney disease, stage 3 (moderate): Secondary | ICD-10-CM | POA: Diagnosis present

## 2018-03-10 DIAGNOSIS — Z87891 Personal history of nicotine dependence: Secondary | ICD-10-CM | POA: Diagnosis not present

## 2018-03-10 DIAGNOSIS — N390 Urinary tract infection, site not specified: Secondary | ICD-10-CM | POA: Diagnosis present

## 2018-03-10 DIAGNOSIS — Z823 Family history of stroke: Secondary | ICD-10-CM | POA: Diagnosis not present

## 2018-03-10 DIAGNOSIS — E782 Mixed hyperlipidemia: Secondary | ICD-10-CM | POA: Diagnosis present

## 2018-03-10 DIAGNOSIS — Z807 Family history of other malignant neoplasms of lymphoid, hematopoietic and related tissues: Secondary | ICD-10-CM | POA: Diagnosis not present

## 2018-03-10 DIAGNOSIS — Z820 Family history of epilepsy and other diseases of the nervous system: Secondary | ICD-10-CM | POA: Diagnosis not present

## 2018-03-10 DIAGNOSIS — L03211 Cellulitis of face: Secondary | ICD-10-CM | POA: Diagnosis present

## 2018-03-10 DIAGNOSIS — E1151 Type 2 diabetes mellitus with diabetic peripheral angiopathy without gangrene: Secondary | ICD-10-CM | POA: Diagnosis present

## 2018-03-10 DIAGNOSIS — E1122 Type 2 diabetes mellitus with diabetic chronic kidney disease: Secondary | ICD-10-CM | POA: Diagnosis present

## 2018-03-10 DIAGNOSIS — Z833 Family history of diabetes mellitus: Secondary | ICD-10-CM | POA: Diagnosis not present

## 2018-03-10 DIAGNOSIS — R509 Fever, unspecified: Secondary | ICD-10-CM | POA: Diagnosis present

## 2018-03-10 DIAGNOSIS — Z8249 Family history of ischemic heart disease and other diseases of the circulatory system: Secondary | ICD-10-CM | POA: Diagnosis not present

## 2018-03-10 DIAGNOSIS — Z885 Allergy status to narcotic agent status: Secondary | ICD-10-CM | POA: Diagnosis not present

## 2018-03-10 DIAGNOSIS — Z8261 Family history of arthritis: Secondary | ICD-10-CM | POA: Diagnosis not present

## 2018-03-10 DIAGNOSIS — Z794 Long term (current) use of insulin: Secondary | ICD-10-CM | POA: Diagnosis not present

## 2018-03-10 LAB — BASIC METABOLIC PANEL
Anion gap: 9 (ref 5–15)
BUN: 67 mg/dL — AB (ref 8–23)
CHLORIDE: 111 mmol/L (ref 98–111)
CO2: 18 mmol/L — AB (ref 22–32)
Calcium: 8.5 mg/dL — ABNORMAL LOW (ref 8.9–10.3)
Creatinine, Ser: 3.64 mg/dL — ABNORMAL HIGH (ref 0.61–1.24)
GFR calc non Af Amer: 15 mL/min — ABNORMAL LOW (ref >60)
GFR, EST AFRICAN AMERICAN: 18 mL/min — AB (ref >60)
Glucose, Bld: 194 mg/dL — ABNORMAL HIGH (ref 70–99)
POTASSIUM: 4.1 mmol/L (ref 3.5–5.1)
SODIUM: 138 mmol/L (ref 135–145)

## 2018-03-10 LAB — CBC WITH DIFFERENTIAL/PLATELET
Basophils Absolute: 0 10*3/uL (ref 0.0–0.1)
Basophils Relative: 0 %
Eosinophils Absolute: 0.2 10*3/uL (ref 0.0–0.7)
Eosinophils Relative: 1 %
HCT: 32.6 % — ABNORMAL LOW (ref 39.0–52.0)
HEMOGLOBIN: 10.8 g/dL — AB (ref 13.0–17.0)
LYMPHS ABS: 1 10*3/uL (ref 0.7–4.0)
LYMPHS PCT: 6 %
MCH: 27.6 pg (ref 26.0–34.0)
MCHC: 33.1 g/dL (ref 30.0–36.0)
MCV: 83.2 fL (ref 78.0–100.0)
MONOS PCT: 11 %
Monocytes Absolute: 1.7 10*3/uL — ABNORMAL HIGH (ref 0.1–1.0)
NEUTROS PCT: 82 %
Neutro Abs: 13 10*3/uL — ABNORMAL HIGH (ref 1.7–7.7)
Platelets: 310 10*3/uL (ref 150–400)
RBC: 3.92 MIL/uL — AB (ref 4.22–5.81)
RDW: 13.4 % (ref 11.5–15.5)
WBC: 16 10*3/uL — ABNORMAL HIGH (ref 4.0–10.5)

## 2018-03-10 LAB — GLUCOSE, CAPILLARY
GLUCOSE-CAPILLARY: 183 mg/dL — AB (ref 70–99)
GLUCOSE-CAPILLARY: 167 mg/dL — AB (ref 70–99)
Glucose-Capillary: 180 mg/dL — ABNORMAL HIGH (ref 70–99)
Glucose-Capillary: 158 mg/dL — ABNORMAL HIGH (ref 70–99)

## 2018-03-10 IMAGING — US US RENAL
1 series · 14 of 25 positions shown · non-contrast
Comparison: None.

CLINICAL DATA: Acute renal failure.

EXAM:
RENAL / URINARY TRACT ULTRASOUND COMPLETE

[Series 1: us renal · 14 of 35 slices shown]
[im 1/35]
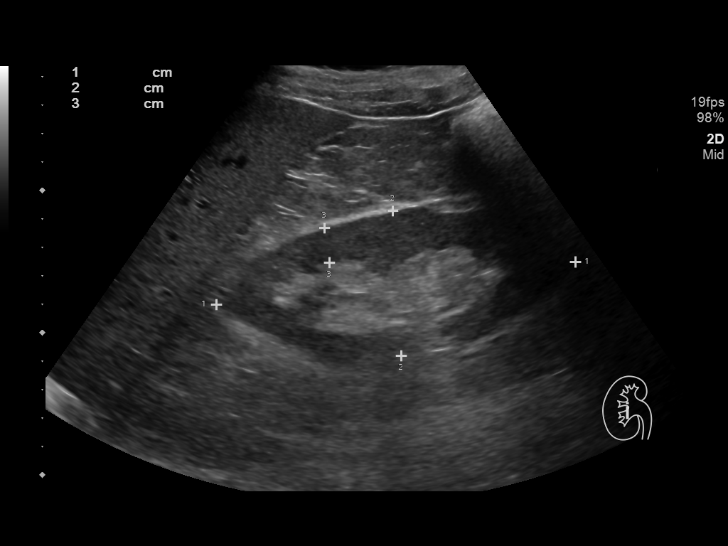
[im 3/35]
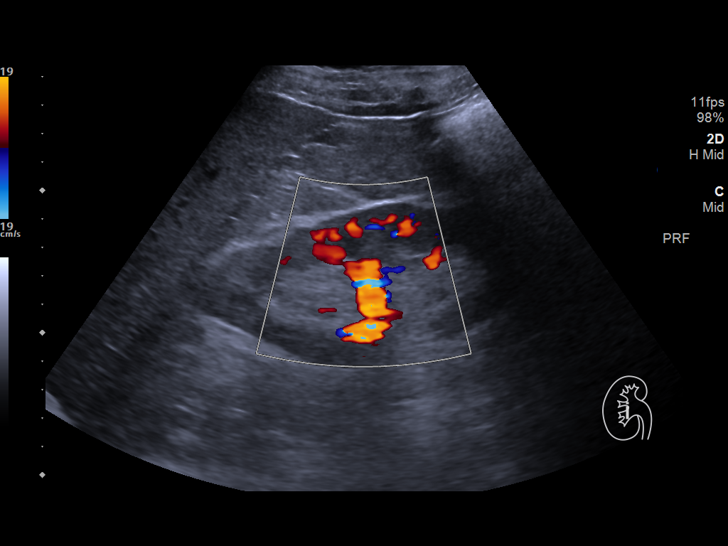
[im 6/35]
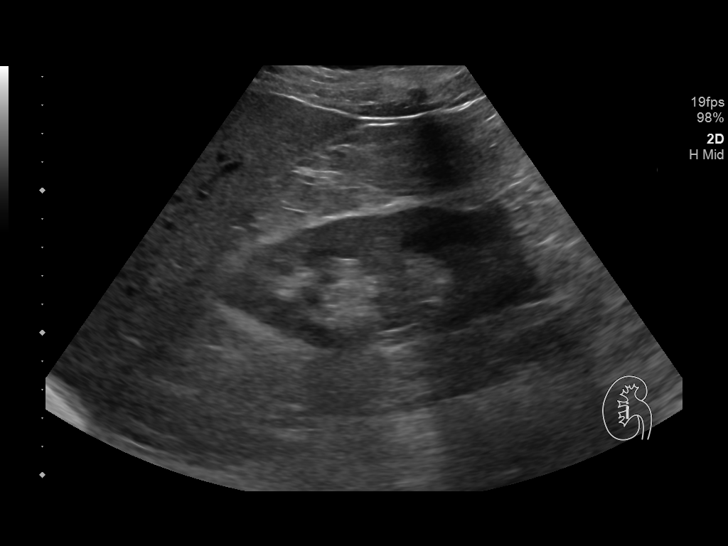
[im 9/35]
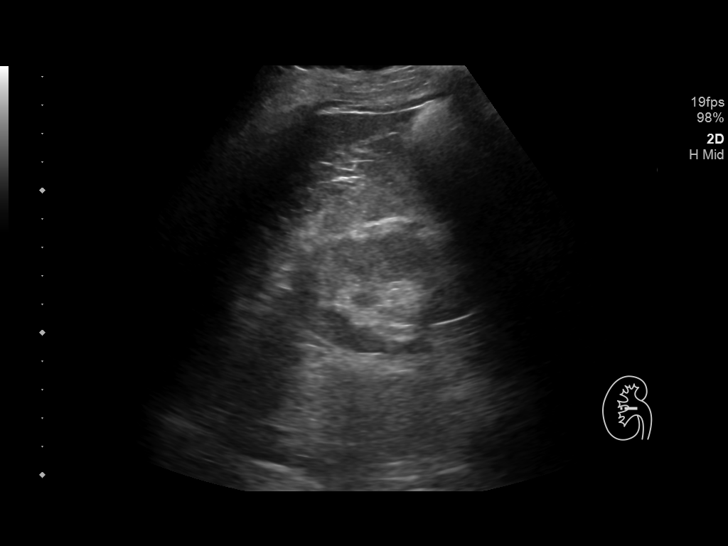
[im 12/35]
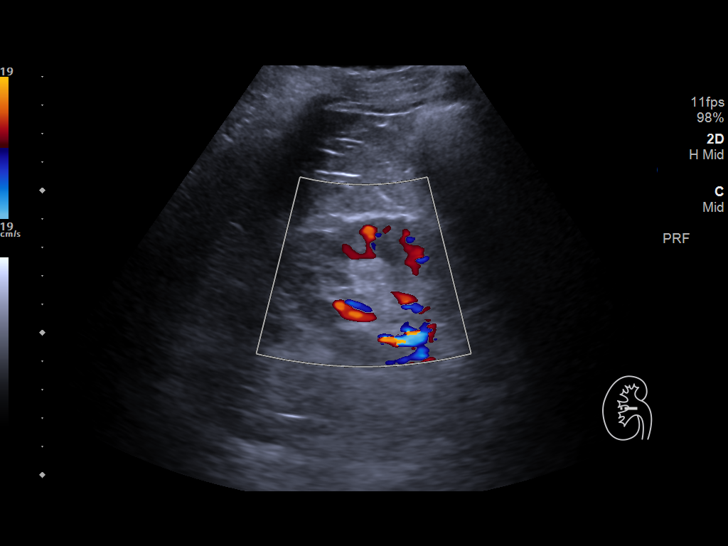
[im 13/35]
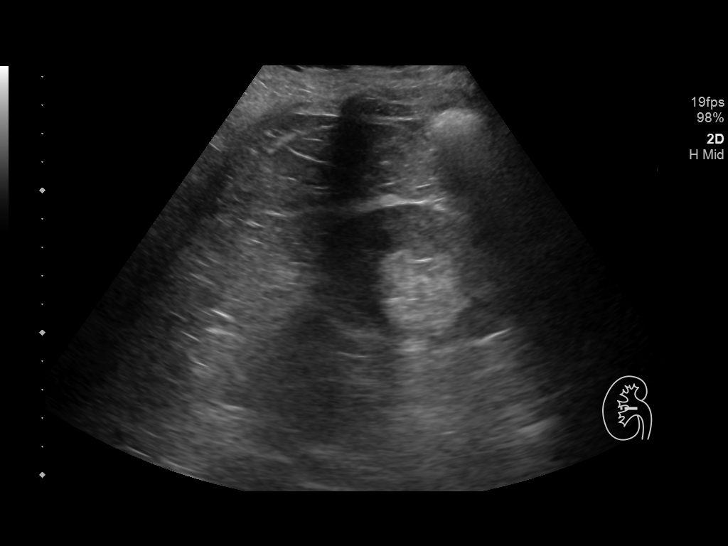
[im 16/35]
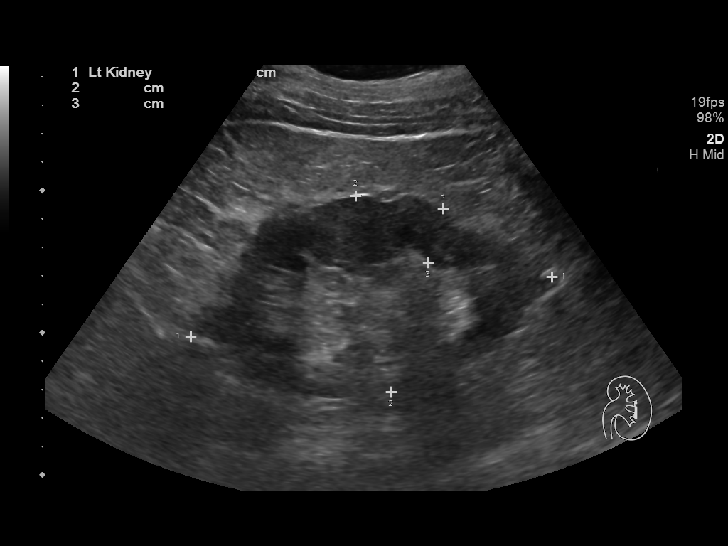
[im 19/35]
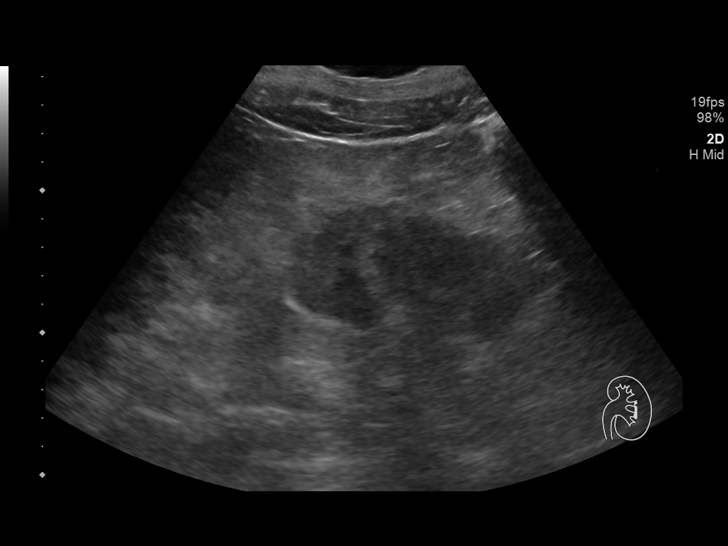
[im 22/35]
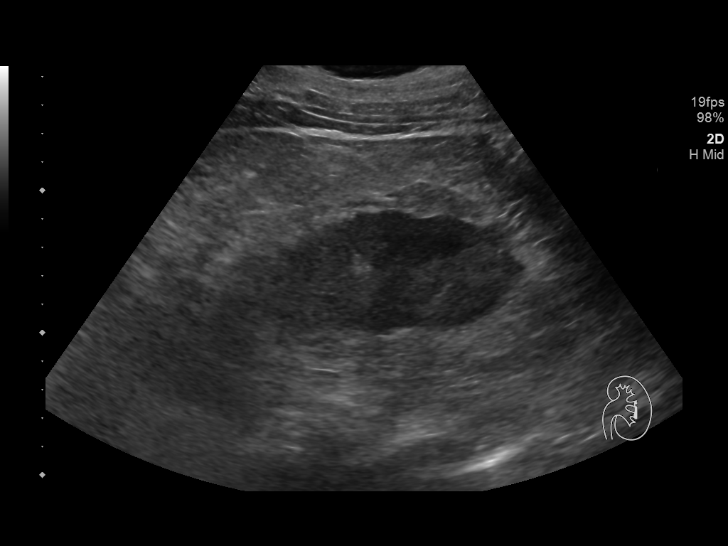
[im 23/35]
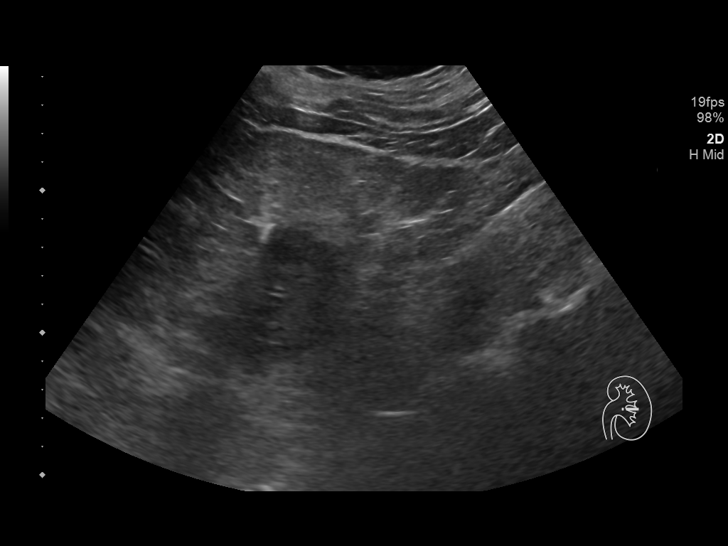
[im 26/35]
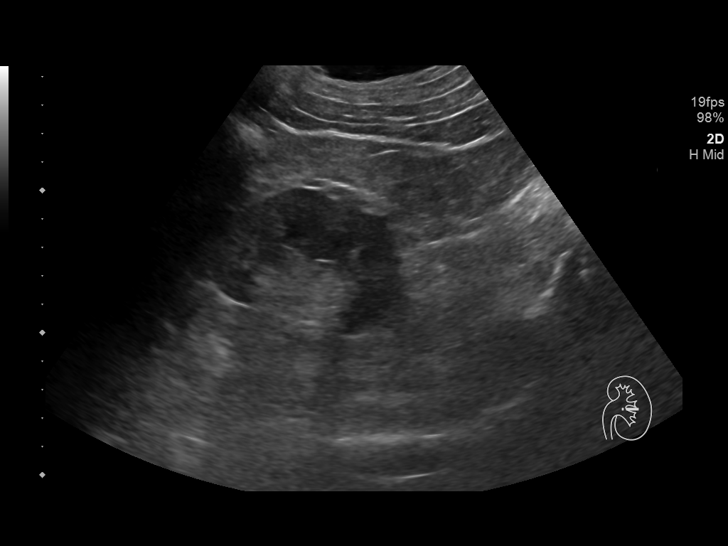
[im 29/35]
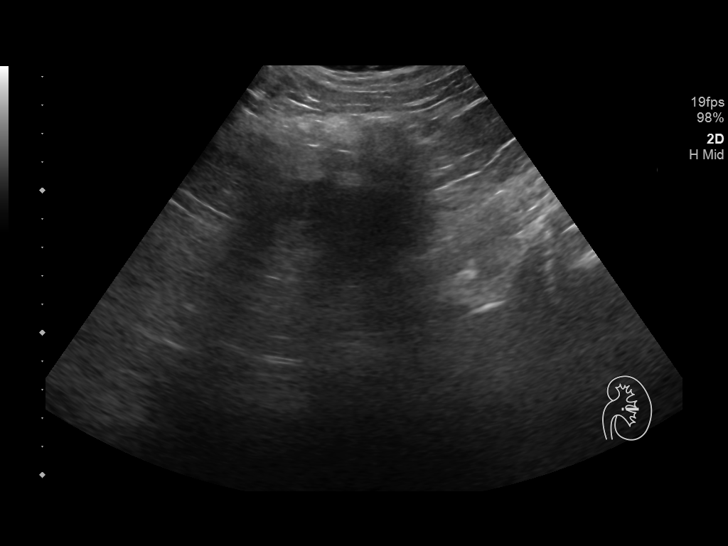
[im 32/35]
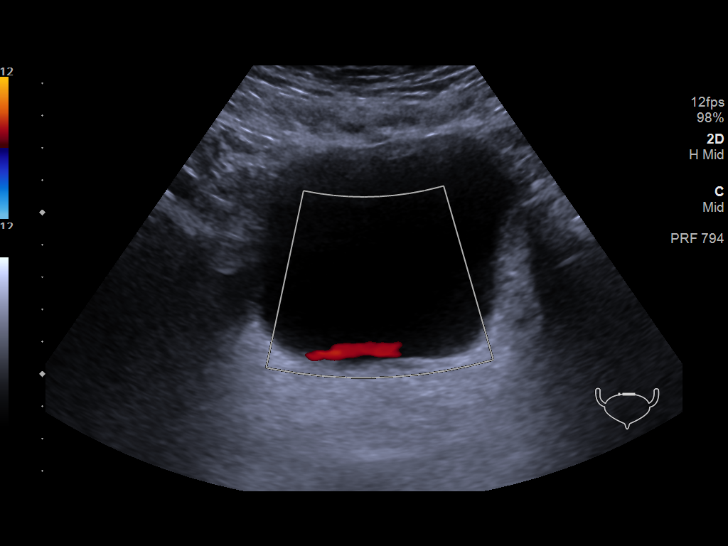
[im 35/35]
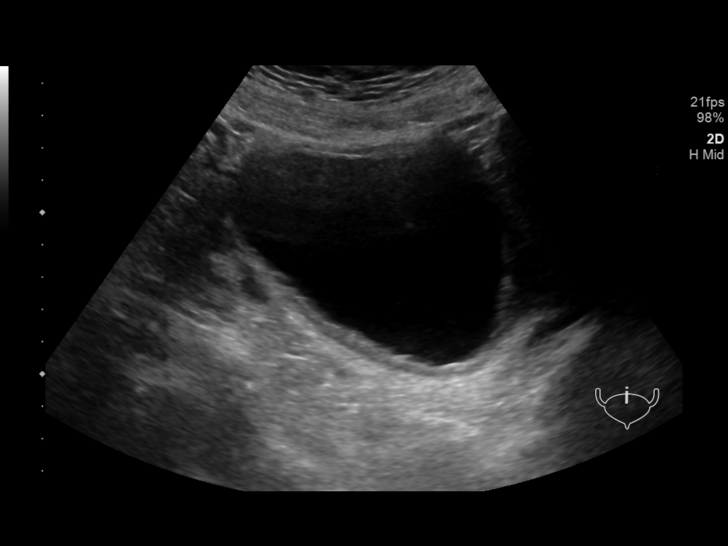

[14 of 25 positions shown; findings below may reference images not displayed]

FINDINGS: Right Kidney:

Length: 12.7 cm. Echogenicity within normal limits. No mass or
hydronephrosis visualized.

Left Kidney:

Length: 12.9 cm. Echogenicity within normal limits. No mass or
hydronephrosis visualized.

Bladder:

Appears normal for degree of bladder distention.
IMPRESSION: No cause for the patient's symptoms identified.  Normal study.

## 2018-03-10 MED ORDER — INSULIN NPH (HUMAN) (ISOPHANE) 100 UNIT/ML ~~LOC~~ SUSP
20.0000 [IU] | Freq: Every day | SUBCUTANEOUS | Status: DC
  Administered 2018-03-10 – 2018-03-12 (×3): 20 [IU] via SUBCUTANEOUS
  Filled 2018-03-10: qty 10

## 2018-03-10 MED ORDER — INSULIN NPH (HUMAN) (ISOPHANE) 100 UNIT/ML ~~LOC~~ SUSP
12.0000 [IU] | Freq: Every day | SUBCUTANEOUS | Status: DC
  Administered 2018-03-10 – 2018-03-13 (×4): 12 [IU] via SUBCUTANEOUS

## 2018-03-10 MED ORDER — INSULIN ASPART 100 UNIT/ML ~~LOC~~ SOLN
0.0000 [IU] | Freq: Three times a day (TID) | SUBCUTANEOUS | Status: DC
  Administered 2018-03-10 (×3): 2 [IU] via SUBCUTANEOUS
  Administered 2018-03-11 (×2): 5 [IU] via SUBCUTANEOUS
  Administered 2018-03-11: 3 [IU] via SUBCUTANEOUS
  Administered 2018-03-12 (×2): 2 [IU] via SUBCUTANEOUS
  Administered 2018-03-12 – 2018-03-13 (×2): 5 [IU] via SUBCUTANEOUS
  Administered 2018-03-13: 3 [IU] via SUBCUTANEOUS
  Administered 2018-03-13: 2 [IU] via SUBCUTANEOUS
  Administered 2018-03-14: 3 [IU] via SUBCUTANEOUS

## 2018-03-10 MED ORDER — ACETAMINOPHEN 650 MG RE SUPP: 650.0000 mg | Freq: Four times a day (QID) | RECTAL | Status: DC | PRN

## 2018-03-10 MED ORDER — ACETAMINOPHEN 325 MG PO TABS: 650.0000 mg | ORAL_TABLET | Freq: Four times a day (QID) | ORAL | Status: DC | PRN

## 2018-03-10 MED ORDER — GABAPENTIN 100 MG PO CAPS
100.0000 mg | ORAL_CAPSULE | Freq: Every day | ORAL | Status: DC
  Administered 2018-03-11 – 2018-03-13 (×3): 100 mg via ORAL
  Filled 2018-03-10 (×3): qty 1

## 2018-03-10 MED ORDER — SODIUM CHLORIDE 0.9 % IV SOLN
1.0000 g | Freq: Two times a day (BID) | INTRAVENOUS | Status: DC
  Administered 2018-03-10 – 2018-03-11 (×4): 1 g via INTRAVENOUS
  Filled 2018-03-10 (×5): qty 1

## 2018-03-10 MED ORDER — CARVEDILOL 25 MG PO TABS
25.0000 mg | ORAL_TABLET | Freq: Two times a day (BID) | ORAL | Status: DC
  Administered 2018-03-10 – 2018-03-14 (×10): 25 mg via ORAL
  Filled 2018-03-10 (×11): qty 1

## 2018-03-10 MED ORDER — HYDRALAZINE HCL 25 MG PO TABS: 25.0000 mg | ORAL_TABLET | Freq: Four times a day (QID) | ORAL | Status: DC | PRN

## 2018-03-10 MED ORDER — CLONAZEPAM 0.5 MG PO TABS: 0.5000 mg | ORAL_TABLET | Freq: Once | ORAL | Status: DC

## 2018-03-10 MED ORDER — AMLODIPINE BESYLATE 5 MG PO TABS
10.0000 mg | ORAL_TABLET | Freq: Every day | ORAL | Status: DC
  Administered 2018-03-10 – 2018-03-14 (×5): 10 mg via ORAL
  Filled 2018-03-10 (×5): qty 2

## 2018-03-10 MED ORDER — ATORVASTATIN CALCIUM 40 MG PO TABS
80.0000 mg | ORAL_TABLET | Freq: Every day | ORAL | Status: DC
  Administered 2018-03-10 – 2018-03-14 (×5): 80 mg via ORAL
  Filled 2018-03-10 (×5): qty 2

## 2018-03-10 MED ORDER — ENOXAPARIN SODIUM 30 MG/0.3ML ~~LOC~~ SOLN
30.0000 mg | SUBCUTANEOUS | Status: DC
  Administered 2018-03-10 – 2018-03-13 (×4): 30 mg via SUBCUTANEOUS
  Filled 2018-03-10 (×4): qty 0.3
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
## 2018-03-10 NOTE — Progress Notes (Addendum)
Subjective: Patient admitted this morning, see detailed H&P by Dr Hampton Abbot 78 year old male with a history of diabetes mellitus, diabetic neuropathy, hypertension came to hospital with fever and malaise.  Also complained of facial swelling left more than right.  Increased rhinorrhea and tear production.  CT maxillofacial showed swelling of upper lip and left nasal tissues but no sinusitis.   Patient feels better this morning.  Vitals:   03/10/18 0116 03/10/18 0609  BP: (!) 188/92 (!) 195/97  Pulse: 89 87  Resp:  18  Temp:  98 F (36.7 C)  SpO2:  99%      A/P Left facial cellulitis-continue cefepime Acute kidney injury on CKD-patient came with creatinine of 4.1, has improved to 3.64 with IV fluids. Diabetes mellitus-continue NPH, sliding scale insulin with NovoLog. Hypertension-blood pressures is elevated, continue amlodipine, Coreg.  Aldactone and lisinopril is on hold due to renal insufficiency.  Will add Hydralazine 25 mg po q 6 hr prn    [facility] Pager- [phone]

## 2018-03-10 NOTE — Progress Notes (Signed)
Preliminary notes--Bilateral renal arterial duplex exam completed.   Right: Abnormal right Resistive Index. RRV flow present. No evidence of right renal artery stenosis. Study not completely reliable due to patient could not cooperate during the exam.   Left: Abnormal left Resisitve Index. LRV flow present. No evidence of left renal artery stenosis. Study not completely reliable due to patient could not cooperate during the exam.   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  Hongying Jonnae Fonseca (RDMS RVT) 03/10/18 10:08 AM

## 2018-03-10 NOTE — ED Notes (Signed)
ED TO INPATIENT HANDOFF REPORT  Name/Age/Gender Jermaine Mitchell 77 y.o. male  Code Status    Code Status Orders  (From admission, onward)         Start     Ordered   03/10/18 0016  Full code  Continuous     03/10/18 0021        Code Status History    This patient has a current code status but no historical code status.      Home/SNF/Other Home  Chief Complaint Hyperglycemia/Fever   Level of Care/Admitting Diagnosis ED Disposition    ED Disposition Condition [facility]: [facility] [[id number]]  Level of Care: Med-Surg [16]  Diagnosis: Fever [459977]  Admitting Physician: Colbert Ewing [[id number]]  Attending Physician: Colbert Ewing [[id number]]  Estimated length of stay: past midnight tomorrow  Certification:: I certify this patient will need inpatient services for at least 2 midnights  PT Class (Do Not Modify): Inpatient [101]  PT Acc Code (Do Not Modify): Private [1]       Medical History Past Medical History:  Diagnosis Date  . Anemia   . Diabetes mellitus type II 1990  . Diabetic nephropathy ([place])   . Diabetic neuropathy ([place])    sees optho every 6 months  . Elevated PSA 08/13/2016  . Erectile dysfunction   . Hyperlipidemia   . Hyperlipidemia, mixed 06/15/2008   Qualifier: Diagnosis of  By: Redmond Pulling MD, Frann Rider    . Hypertension    with some white coat HTN on top of essential HTN  . Low back pain 08/10/2016  . Preventative health care 08/24/2013  . Tinea corporis 02/14/2013  . Urinary hesitancy 08/26/2015    Allergies Allergies  Allergen Reactions  . Codeine     "Makes me feel funny."    IV Location/Drains/Wounds Patient Lines/Drains/Airways Status   Active Line/Drains/Airways    Name:   Placement date:   Placement time:   Site:   Days:   Peripheral IV 03/09/18 Left Antecubital   03/09/18    2045    Antecubital   1          Labs/Imaging Results for orders placed or performed during the hospital  encounter of 03/09/18 (from the past 48 hour(s))  CBC with Differential     Status: Abnormal   Collection Time: 03/09/18  7:52 PM  Result Value Ref Range   WBC 16.7 (H) 4.0 - 10.5 K/uL   RBC 4.36 4.22 - 5.81 MIL/uL   Hemoglobin 12.2 (L) 13.0 - 17.0 g/dL   HCT 35.6 (L) 39.0 - 52.0 %   MCV 81.7 78.0 - 100.0 fL   MCH 28.0 26.0 - 34.0 pg   MCHC 34.3 30.0 - 36.0 g/dL   RDW 13.4 11.5 - 15.5 %   Platelets 339 150 - 400 K/uL   Neutrophils Relative % 84 %   Neutro Abs 14.0 (H) 1.7 - 7.7 K/uL   Lymphocytes Relative 5 %   Lymphs Abs 0.8 0.7 - 4.0 K/uL   Monocytes Relative 10 %   Monocytes Absolute 1.7 (H) 0.1 - 1.0 K/uL   Eosinophils Relative 1 %   Eosinophils Absolute 0.2 0.0 - 0.7 K/uL   Basophils Relative 0 %   Basophils Absolute 0.0 0.0 - 0.1 K/uL    Comment: Performed at [facility], [place] [address], [place], [place] [address]  Comprehensive metabolic panel     Status: Abnormal   Collection Time: 03/09/18  7:52 PM  Result Value Ref Range   Sodium 140 135 - 145 mmol/L   Potassium 4.8 3.5 - 5.1 mmol/L   Chloride 110 98 - 111 mmol/L   CO2 20 (L) 22 - 32 mmol/L   Glucose, Bld 339 (H) 70 - 99 mg/dL   BUN 81 (H) 8 - 23 mg/dL   Creatinine, Ser 4.14 (H) 0.61 - 1.24 mg/dL   Calcium 9.3 8.9 - 10.3 mg/dL   Total Protein 8.5 (H) 6.5 - 8.1 g/dL   Albumin 3.0 (L) 3.5 - 5.0 g/dL   AST 52 (H) 15 - 41 U/L   ALT 26 0 - 44 U/L   Alkaline Phosphatase 76 38 - 126 U/L   Total Bilirubin 0.7 0.3 - 1.2 mg/dL   GFR calc non Af Amer 13 (L) >60 mL/min   GFR calc Af Amer 15 (L) >60 mL/min    Comment: (NOTE) The eGFR has been calculated using the CKD EPI equation. This calculation has not been validated in all clinical situations. eGFR's persistently <60 mL/min signify possible Chronic Kidney Disease.    Anion gap 10 5 - 15    Comment: Performed at [facility], [place] [address] [place], [place] [address]  Blood gas, venous     Status: Abnormal   Collection Time:  03/09/18  8:35 PM  Result Value Ref Range   FIO2 21.00    pH, Ven 7.292 7.250 - 7.430   pCO2, Ven 40.1 (L) 44.0 - 60.0 mmHg   pO2, Ven BELOW REPORTABLE RANGW 32.0 - 45.0 mmHg    Comment: CRITICAL RESULT CALLED TO, READ BACK BY AND VERIFIED WITH: Merrily Pew MD AT 2042 BY AMT RAY RRT ON 03/09/2018    Bicarbonate 18.8 (L) 20.0 - 28.0 mmol/L   Acid-base deficit 6.9 (H) 0.0 - 2.0 mmol/L   O2 Saturation 36.1 %   Patient temperature 98.6    Collection site VEIN    Drawn by COLLECTED BY NURSE    Sample type VENOUS     Comment: Performed at [facility] [address] [place], [place] [address]  Urinalysis, Routine w reflex microscopic     Status: Abnormal   Collection Time: 03/09/18 10:47 PM  Result Value Ref Range   Color, Urine YELLOW YELLOW   APPearance CLOUDY (A) CLEAR   Specific Gravity, Urine 1.009 1.005 - 1.030   pH 5.0 5.0 - 8.0   Glucose, UA >=500 (A) NEGATIVE mg/dL   Hgb urine dipstick LARGE (A) NEGATIVE   Bilirubin Urine NEGATIVE NEGATIVE   Ketones, ur NEGATIVE NEGATIVE mg/dL   Protein, ur 100 (A) NEGATIVE mg/dL   Nitrite NEGATIVE NEGATIVE   Leukocytes, UA LARGE (A) NEGATIVE   RBC / HPF >50 (H) 0 - 5 RBC/hpf   WBC, UA >50 (H) 0 - 5 WBC/hpf   Bacteria, UA NONE SEEN NONE SEEN   Squamous Epithelial / LPF 0-5 0 - 5   WBC Clumps PRESENT    Hyaline Casts, UA PRESENT    Non Squamous Epithelial 0-5 (A) NONE SEEN    Comment: Performed at [facility], [place] [address], [place], [place] [address]   Dg Chest 2 View  Result Date: 03/09/2018 CLINICAL DATA:  Tooth abscess with worsening pain and swelling. EXAM: CHEST - 2 VIEW COMPARISON:  12/29/2004 FINDINGS: AP and lateral views of the chest obtained. The lungs are clear without focal pneumonia, edema, pneumothorax or pleural effusion. Tiny nodule identified over the left apex. The  cardiopericardial silhouette is within normal limits for size. The visualized bony structures of the thorax are  intact. Telemetry leads overlie the chest. IMPRESSION: Tiny nodular density over the left apex. CT chest without contrast recommended to further evaluate. No focal airspace consolidation or pulmonary edema. No pleural effusion. Electronically Signed   By: Misty Stanley M.D.   On: 03/09/2018 20:15   Ct Head Wo Contrast  Result Date: 03/09/2018 CLINICAL DATA:  77 y/o M; sinusitis, acute (<0EYE), complications suspected; Altered mental status (AMS), unclear cause. EXAM: CT HEAD WITHOUT CONTRAST CT MAXILLOFACIAL WITHOUT CONTRAST TECHNIQUE: Multidetector CT imaging of the head and maxillofacial structures were performed using the standard protocol without intravenous contrast. Multiplanar CT image reconstructions of the maxillofacial structures were also generated. COMPARISON:  None. FINDINGS: CT HEAD FINDINGS Brain: No evidence of acute infarction, hemorrhage, hydrocephalus, extra-axial collection or mass lesion/mass effect. Small hypodensities within bilateral thalamus, likely chronic infarcts. Nonspecific white matter hypodensities are compatible with moderate chronic microvascular ischemic changes and there is moderate volume loss of the brain. Vascular: Calcific atherosclerosis of carotid siphons and vertebral arteries. No hyperdense vessel identified. Skull: Normal. Negative for fracture or focal lesion. Other: External auditory canal chondral ossification noted. Debris within the external auditory canals is likely cerumen. CT MAXILLOFACIAL FINDINGS Osseous: No fracture or mandibular dislocation. No destructive process. Orbits: Bilateral intra-ocular lens replacement. No acute traumatic or inflammatory finding of the orbital compartments. Sinuses: Mild maxillary sinus mucosal thickening and small mucous retention cyst. Additional paranasal sinuses and the mastoid air cells are normally aerated. Soft tissues: Extensive swelling of the upper lip and superficial nasal soft tissues. No findings of osteomyelitis or  discrete fluid collection. IMPRESSION: 1. No acute intracranial abnormality identified. 2. Moderate chronic microvascular ischemic changes and moderate volume loss of the brain. 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 3. Extensive swelling of upper lip and superficial nasal soft tissues. No findings of osteomyelitis or discrete fluid collection. No extension of inflammation into the deep facial or orbital compartments. No associated dental lesion identified. 4. Mild paranasal sinus disease.  No findings of acute sinusitis. Electronically Signed   By: Kristine Garbe M.D.   On: 03/09/2018 22:46   Ct Chest Wo Contrast  Result Date: 03/09/2018 CLINICAL DATA:  Lung nodule, <1cm, mod-high risk EXAM: CT CHEST WITHOUT CONTRAST TECHNIQUE: Multidetector CT imaging of the chest was performed following the standard protocol without IV contrast. Performed in conjunction with CT of the head and face. COMPARISON:  Chest radiograph earlier this day. FINDINGS: Cardiovascular: Aortic atherosclerosis and tortuosity. Heart is normal in size. There are coronary artery calcifications. Small amount pericardial fluid or thickening anteriorly. Mediastinum/Nodes: Small mediastinal nodes not enlarged by size criteria. No bulky hilar adenopathy, lack of IV contrast limits assessment. Small hiatal hernia. No visualized thyroid nodule. Lungs/Pleura: No pulmonary nodule, particularly no nodule at the left lung apex to correspond to that questioned on radiograph. There are subclavian artery vascular calcifications in the region of question nodules, likely causing the appearance on radiograph. Minor dependent atelectasis in both lower lobes. The lungs are otherwise clear. No consolidation, pulmonary edema or pleural effusion. Upper Abdomen: No acute findings.  Incidental gallstones. Musculoskeletal: There are no acute or suspicious osseous abnormalities. IMPRESSION: 1. No pulmonary nodule. The area questioned on radiograph  represents calcification in the subclavian artery. 2. No acute chest finding. 3. Aortic Atherosclerosis (ICD10-I70.0). Coronary artery calcifications. Electronically Signed   By: Keith Rake M.D.   On: 03/09/2018 22:55   Dg Foot Complete  Right  Result Date: 03/09/2018 CLINICAL DATA:  Evaluate for infection. EXAM: RIGHT FOOT COMPLETE - 3+ VIEW COMPARISON:  None. FINDINGS: Vascular calcifications are noted. No fractures. No bony erosion to suggest osteomyelitis. IMPRESSION: Vascular calcifications.  No evidence of osteomyelitis. Electronically Signed   By: Dorise Bullion III M.D   On: 03/09/2018 21:10   Ct Maxillofacial Wo Contrast  Result Date: 03/09/2018 CLINICAL DATA:  77 y/o M; sinusitis, acute (<3ALP), complications suspected; Altered mental status (AMS), unclear cause. EXAM: CT HEAD WITHOUT CONTRAST CT MAXILLOFACIAL WITHOUT CONTRAST TECHNIQUE: Multidetector CT imaging of the head and maxillofacial structures were performed using the standard protocol without intravenous contrast. Multiplanar CT image reconstructions of the maxillofacial structures were also generated. COMPARISON:  None. FINDINGS: CT HEAD FINDINGS Brain: No evidence of acute infarction, hemorrhage, hydrocephalus, extra-axial collection or mass lesion/mass effect. Small hypodensities within bilateral thalamus, likely chronic infarcts. Nonspecific white matter hypodensities are compatible with moderate chronic microvascular ischemic changes and there is moderate volume loss of the brain. Vascular: Calcific atherosclerosis of carotid siphons and vertebral arteries. No hyperdense vessel identified. Skull: Normal. Negative for fracture or focal lesion. Other: External auditory canal chondral ossification noted. Debris within the external auditory canals is likely cerumen. CT MAXILLOFACIAL FINDINGS Osseous: No fracture or mandibular dislocation. No destructive process. Orbits: Bilateral intra-ocular lens replacement. No acute traumatic or  inflammatory finding of the orbital compartments. Sinuses: Mild maxillary sinus mucosal thickening and small mucous retention cyst. Additional paranasal sinuses and the mastoid air cells are normally aerated. Soft tissues: Extensive swelling of the upper lip and superficial nasal soft tissues. No findings of osteomyelitis or discrete fluid collection. IMPRESSION: 1. No acute intracranial abnormality identified. 2. Moderate chronic microvascular ischemic changes and moderate volume loss of the brain. 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 3. Extensive swelling of upper lip and superficial nasal soft tissues. No findings of osteomyelitis or discrete fluid collection. No extension of inflammation into the deep facial or orbital compartments. No associated dental lesion identified. 4. Mild paranasal sinus disease.  No findings of acute sinusitis. Electronically Signed   By: Kristine Garbe M.D.   On: 03/09/2018 22:46    Pending Labs Unresulted Labs (From admission, onward)    Start     Ordered   03/17/18 0500  Creatinine, serum  (enoxaparin (LOVENOX)    CrCl < 30 ml/min)  Weekly,   R    Comments:  while on enoxaparin therapy.    03/10/18 0021   03/10/18 3790  Basic metabolic panel  Tomorrow morning,   R     03/10/18 0021   03/10/18 0500  CBC  Tomorrow morning,   R     03/10/18 0021   03/10/18 0025  Culture, blood (routine x 2)  BLOOD CULTURE X 2,   R     03/10/18 0024   03/10/18 0017  Culture, Urine  Add-on,   R     03/10/18 0021   03/10/18 0015  CBC  (enoxaparin (LOVENOX)    CrCl < 30 ml/min)  Once,   R    Comments:  Baseline for enoxaparin therapy IF NOT ALREADY DRAWN.  Notify MD if PLT < 100 K.    03/10/18 0021   03/10/18 0015  Creatinine, serum  (enoxaparin (LOVENOX)    CrCl < 30 ml/min)  Once,   R    Comments:  Baseline for enoxaparin therapy IF NOT ALREADY DRAWN.    03/10/18 0021          Vitals/Pain  Today's Vitals   03/09/18 1925 03/09/18 1939 03/09/18 2134  03/09/18 2314  BP:   (!) 169/86 (!) 184/103  Pulse:   88 88  Resp:   15 18  Temp:      TempSrc:      SpO2:   100% 100%  Weight: 86.2 kg     Height: 5' 9"  (1.753 m)     PainSc:  5       Isolation Precautions No active isolations  Medications Medications  fluticasone (FLONASE) 50 MCG/ACT nasal spray 2 spray (has no administration in time range)  vancomycin (VANCOCIN) 1,750 mg in sodium chloride 0.9 % 500 mL IVPB (1,750 mg Intravenous New Bag/Given 03/09/18 2346)  amLODipine (NORVASC) tablet 10 mg (has no administration in time range)  amLODipine (NORVASC) tablet 10 mg (has no administration in time range)  atorvastatin (LIPITOR) tablet 80 mg (has no administration in time range)  gabapentin (NEURONTIN) capsule 100 mg (has no administration in time range)  enoxaparin (LOVENOX) injection 30 mg (has no administration in time range)  acetaminophen (TYLENOL) tablet 650 mg (has no administration in time range)    Or  acetaminophen (TYLENOL) suppository 650 mg (has no administration in time range)  ceFEPIme (MAXIPIME) 1 g in sodium chloride 0.9 % 100 mL IVPB (has no administration in time range)  insulin aspart (novoLOG) injection 0-9 Units (has no administration in time range)  insulin NPH Human (HUMULIN N,NOVOLIN N) injection 20 Units (has no administration in time range)  insulin NPH Human (HUMULIN N,NOVOLIN N) injection 12 Units (has no administration in time range)  clonazePAM (KLONOPIN) tablet 0.5 mg (has no administration in time range)  carvedilol (COREG) tablet 25 mg (has no administration in time range)  lactated ringers bolus 1,000 mL (0 mLs Intravenous Stopped 03/09/18 2248)  lactated ringers bolus 1,000 mL (1,000 mLs Intravenous New Bag/Given 03/09/18 2250)  ceFEPIme (MAXIPIME) 1 g in sodium chloride 0.9 % 100 mL IVPB (1 g Intravenous New Bag/Given 03/09/18 2301)    Mobility walks with person assist and device

## 2018-03-11 LAB — CBC WITH DIFFERENTIAL/PLATELET
BASOS ABS: 0 10*3/uL (ref 0.0–0.1)
BASOS PCT: 0 %
EOS ABS: 0.1 10*3/uL (ref 0.0–0.7)
Eosinophils Relative: 1 %
HCT: 33.7 % — ABNORMAL LOW (ref 39.0–52.0)
HEMOGLOBIN: 11.3 g/dL — AB (ref 13.0–17.0)
Lymphocytes Relative: 6 %
Lymphs Abs: 1.1 10*3/uL (ref 0.7–4.0)
MCH: 27.7 pg (ref 26.0–34.0)
MCHC: 33.5 g/dL (ref 30.0–36.0)
MCV: 82.6 fL (ref 78.0–100.0)
MONO ABS: 1.7 10*3/uL — AB (ref 0.1–1.0)
MONOS PCT: 10 %
NEUTROS PCT: 83 %
Neutro Abs: 14.6 10*3/uL — ABNORMAL HIGH (ref 1.7–7.7)
Platelets: 315 10*3/uL (ref 150–400)
RBC: 4.08 MIL/uL — ABNORMAL LOW (ref 4.22–5.81)
RDW: 13.5 % (ref 11.5–15.5)
WBC: 17.5 10*3/uL — ABNORMAL HIGH (ref 4.0–10.5)

## 2018-03-11 LAB — BASIC METABOLIC PANEL
Anion gap: 11 (ref 5–15)
BUN: 59 mg/dL — ABNORMAL HIGH (ref 8–23)
CALCIUM: 8.3 mg/dL — AB (ref 8.9–10.3)
CO2: 17 mmol/L — AB (ref 22–32)
CREATININE: 2.79 mg/dL — AB (ref 0.61–1.24)
Chloride: 109 mmol/L (ref 98–111)
GFR, EST AFRICAN AMERICAN: 24 mL/min — AB (ref >60)
GFR, EST NON AFRICAN AMERICAN: 21 mL/min — AB (ref >60)
Glucose, Bld: 196 mg/dL — ABNORMAL HIGH (ref 70–99)
Potassium: 4.3 mmol/L (ref 3.5–5.1)
Sodium: 137 mmol/L (ref 135–145)

## 2018-03-11 LAB — MAGNESIUM: MAGNESIUM: 2.1 mg/dL (ref 1.7–2.4)

## 2018-03-11 LAB — GLUCOSE, CAPILLARY
Glucose-Capillary: 231 mg/dL — ABNORMAL HIGH (ref 70–99)
Glucose-Capillary: 279 mg/dL — ABNORMAL HIGH (ref 70–99)
Glucose-Capillary: 271 mg/dL — ABNORMAL HIGH (ref 70–99)
Glucose-Capillary: 277 mg/dL — ABNORMAL HIGH (ref 70–99)

## 2018-03-11 MED ORDER — LISINOPRIL 20 MG PO TABS
20.0000 mg | ORAL_TABLET | Freq: Every day | ORAL | Status: DC
  Administered 2018-03-11 – 2018-03-14 (×4): 20 mg via ORAL
  Filled 2018-03-11 (×4): qty 1

## 2018-03-11 NOTE — Care Management Note (Signed)
Case Management Note  Patient Details  Name: Jermaine Mitchell MRN: [id number] Date of Birth: January 16, 1941  Subjective/Objective:     Spoke with patient at bedside. States he lives at home with his spouse and daughter. He ambulates with a cane, has never fell. He manages his own meds, states he understands how to manage his DM. His family assists with transportation.              Action/Plan: No needs identified  Expected Discharge Date:  03/11/18               Expected Discharge Plan:  Home/Self Care  In-House Referral:  NA  Discharge planning Services  CM Consult  Post Acute Care Choice:  NA Choice offered to:  Patient  DME Arranged:  N/A DME Agency:  NA  HH Arranged:  NA HH Agency:  NA  Status of Service:  Completed, signed off  If discussed at [place] of Stay Meetings, dates discussed:    Additional Comments:  Guadalupe Maple, RN 03/11/2018, 4:13 PM

## 2018-03-11 NOTE — Progress Notes (Addendum)
[facility]  PROGRESS NOTE  Jermaine Mitchell UUV:[id number] DOB: 20-Jun-1940 DOA: 03/09/2018 PCP: Mosie Lukes, MD   Brief HPI:   77 year old male with a history of diabetes mellitus, diabetic neuropathy, hypertension came to the hospital with complaints of malaise decreased appetite.  Also developed facial swelling left more than right.  Patient has chronic right third toe diabetic wound which has remained dry.  Had recent arterial Dopplers which showed moderate peripheral arterial disease and patient has an appointment to see vascular surgery as outpatient.  Patient was empirically started on antibiotics for UTI.  CT maxillofacial did not show sinusitis.    Subjective   Patient seen and examined, feels better this morning.  Swelling has improved.   Assessment/Plan:     1. UTI- urine culture grew Proteus mirabilis, continue Cefepime. Will await urine culture results.  2. Facial cellulitis- significantly improved. Continue antibiotics.  3. Diabetes mellitus type 2- continue NPH, sliding scale insulin with NovoLog.  4. Chronic hypertension-blood pressure is elevated, will add lisinopril, continue with Coreg, amlodipine.  5. Diabetic neuropathy-stable, continue gabapentin.  6. Acute kidney injury on CKD stage III-creatinine is slowly improving.  Today creatinine is 2.7.  Follow BMP in am.  7. Right 3rd toe  ulcer -patient has moderate peripheral arterial disease diagnosed on arterial Doppler done last month.  He has an appointment to see vascular surgery as outpatient in first week of November.     CBG: Recent Labs  Lab 03/10/18 1202 03/10/18 1732 03/10/18 2309 03/11/18 0743 03/11/18 1216  GLUCAP 180* 158* 167* 231* 279*    CBC: Recent Labs  Lab 03/09/18 1952 03/10/18 0221 03/11/18 0423  WBC 16.7* 16.0* 17.5*  NEUTROABS 14.0* 13.0* 14.6*  HGB 12.2* 10.8* 11.3*  HCT 35.6* 32.6* 33.7*  MCV 81.7 83.2 82.6  PLT 339 310 474    Basic Metabolic Panel: Recent  Labs  Lab 03/09/18 1952 03/10/18 0221 03/11/18 0423  NA 140 138 137  K 4.8 4.1 4.3  CL 110 111 109  CO2 20* 18* 17*  GLUCOSE 339* 194* 196*  BUN 81* 67* 59*  CREATININE 4.14* 3.64* 2.79*  CALCIUM 9.3 8.5* 8.3*  MG  --   --  2.1     DVT prophylaxis: Lovenox  Code Status: Full code  Family Communication: Discussed with patient's wife at bedside  Disposition Plan: likely home when medically ready for discharge   Consultants:  None  Procedures:  None   Antibiotics:   Anti-infectives (From admission, onward)   Start     Dose/Rate Route Frequency Ordered Stop   03/10/18 1200  ceFEPIme (MAXIPIME) 1 g in sodium chloride 0.9 % 100 mL IVPB     1 g 200 mL/hr over 30 Minutes Intravenous Every 12 hours 03/10/18 0021     03/09/18 2145  vancomycin (VANCOCIN) 1,750 mg in sodium chloride 0.9 % 500 mL IVPB     1,750 mg 250 mL/hr over 120 Minutes Intravenous  Once 03/09/18 2137 03/10/18 0146   03/09/18 2145  ceFEPIme (MAXIPIME) 1 g in sodium chloride 0.9 % 100 mL IVPB     1 g 200 mL/hr over 30 Minutes Intravenous NOW 03/09/18 2137 03/09/18 2331       Objective   Vitals:   03/10/18 0609 03/10/18 1408 03/10/18 2312 03/11/18 0658  BP: (!) 195/97 133/77 (!) 145/77 130/88  Pulse: 87 88 86 86  Resp: 18 16 16 16   Temp: 98 F (36.7 C) 98.7 F (37.1 C) (!) 97.1 F (36.2 C) (!)  97.5 F (36.4 C)  TempSrc: Oral Oral Axillary Oral  SpO2: 99% 98% 98% 100%  Weight:      Height:        Intake/Output Summary (Last 24 hours) at 03/11/2018 1252 Last data filed at 03/11/2018 0700 Gross per 24 hour  Intake 197.11 ml  Output 1425 ml  Net -1227.89 ml   Filed Weights   03/09/18 1925  Weight: 86.2 kg     Physical Examination:    General: Appears in no acute distress  Face- edema has improved  Cardiovascular: S1-S2, regular  Respiratory: Clear to auscultation bilaterally  Abdomen: Soft, nontender, no organomegaly  Extremities: No edema of the lower  extremities  Neurologic: Alert, oriented x3, no focal deficit noted.     Data Reviewed: I have personally reviewed following labs and imaging studies   Recent Results (from the past 240 hour(s))  Culture, Urine     Status: Abnormal (Preliminary result)   Collection Time: 03/09/18 10:47 PM  Result Value Ref Range Status   Specimen Description   Final    URINE, CLEAN CATCH Performed at [facility] [address], [place], [place] [address]    Special Requests   Final    NONE Performed at [facility], [place] [address] [place], [place] [address]    Culture 80,000 COLONIES/mL PROTEUS MIRABILIS (A)  Final   Report Status PENDING  Incomplete  Culture, blood (routine x 2)     Status: None (Preliminary result)   Collection Time: 03/10/18  2:21 AM  Result Value Ref Range Status   Specimen Description   Final    BLOOD RIGHT ANTECUBITAL Performed at [facility] [address] [place], [place] [address]    Special Requests   Final    BOTTLES DRAWN AEROBIC ONLY Blood Culture adequate volume Performed at [facility] [address], [place], [place] [address]    Culture   Final    NO GROWTH 1 DAY Performed at [facility], [place] [address], [place], [place] [address]    Report Status PENDING  Incomplete  Culture, blood (routine x 2)     Status: None (Preliminary result)   Collection Time: 03/10/18  2:21 AM  Result Value Ref Range Status   Specimen Description   Final    BLOOD RIGHT FOREARM Performed at [facility] [address], [place], [place] [address]    Special Requests   Final    BOTTLES DRAWN AEROBIC ONLY Blood Culture adequate volume Performed at [facility] [address] [place], [place] [address]    Culture   Final    NO GROWTH 1 DAY Performed at [facility], [place] [address] [place], [place] [address]    Report Status PENDING   Incomplete     Liver Function Tests: Recent Labs  Lab 03/09/18 1952  AST 52*  ALT 26  ALKPHOS 76  BILITOT 0.7  PROT 8.5*  ALBUMIN 3.0*   No results for input(s): LIPASE, AMYLASE in the last 168 hours. No results for input(s): AMMONIA in the last 168 hours.  Cardiac Enzymes: No results for input(s): CKTOTAL, CKMB, CKMBINDEX, TROPONINI in the last 168 hours. BNP (last 3 results) No results for input(s): BNP in the last 8760 hours.  ProBNP (last 3 results) No results for input(s): PROBNP in the last 8760 hours.    Studies: Dg Chest 2 View  Result Date: 03/09/2018 CLINICAL DATA:  Tooth abscess with worsening pain and swelling. EXAM: CHEST - 2 VIEW COMPARISON:  12/29/2004 FINDINGS: AP and lateral views of the chest obtained. The lungs are clear without focal pneumonia, edema, pneumothorax or pleural effusion. Tiny nodule identified over the left apex. The cardiopericardial silhouette is within normal limits for size. The visualized bony structures of the thorax are intact. Telemetry leads overlie the chest. IMPRESSION: Tiny nodular density over the left apex. CT chest without contrast recommended to further evaluate. No focal airspace consolidation or pulmonary edema. No pleural effusion. Electronically Signed   By: Misty Stanley M.D.   On: 03/09/2018 20:15   Ct Head Wo Contrast  Result Date: 03/09/2018 CLINICAL DATA:  77 y/o M; sinusitis, acute (<0KXF), complications suspected; Altered mental status (AMS), unclear cause. EXAM: CT HEAD WITHOUT CONTRAST CT MAXILLOFACIAL WITHOUT CONTRAST TECHNIQUE: Multidetector CT imaging of the head and maxillofacial structures were performed using the standard protocol without intravenous contrast. Multiplanar CT image reconstructions of the maxillofacial structures were also generated. COMPARISON:  None. FINDINGS: CT HEAD FINDINGS Brain: No evidence of acute infarction, hemorrhage, hydrocephalus, extra-axial collection or mass lesion/mass effect. Small  hypodensities within bilateral thalamus, likely chronic infarcts. Nonspecific white matter hypodensities are compatible with moderate chronic microvascular ischemic changes and there is moderate volume loss of the brain. Vascular: Calcific atherosclerosis of carotid siphons and vertebral arteries. No hyperdense vessel identified. Skull: Normal. Negative for fracture or focal lesion. Other: External auditory canal chondral ossification noted. Debris within the external auditory canals is likely cerumen. CT MAXILLOFACIAL FINDINGS Osseous: No fracture or mandibular dislocation. No destructive process. Orbits: Bilateral intra-ocular lens replacement. No acute traumatic or inflammatory finding of the orbital compartments. Sinuses: Mild maxillary sinus mucosal thickening and small mucous retention cyst. Additional paranasal sinuses and the mastoid air cells are normally aerated. Soft tissues: Extensive swelling of the upper lip and superficial nasal soft tissues. No findings of osteomyelitis or discrete fluid collection. IMPRESSION: 1. No acute intracranial abnormality identified. 2. Moderate chronic microvascular ischemic changes and moderate volume loss of the brain. 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 3. Extensive swelling of upper lip and superficial nasal soft tissues. No findings of osteomyelitis or discrete fluid collection. No extension of inflammation into the deep facial or orbital compartments. No associated dental lesion identified. 4. Mild paranasal sinus disease.  No findings of acute sinusitis. Electronically Signed   By: Kristine Garbe M.D.   On: 03/09/2018 22:46   Ct Chest Wo Contrast  Result Date: 03/09/2018 CLINICAL DATA:  Lung nodule, <1cm, mod-high risk EXAM: CT CHEST WITHOUT CONTRAST TECHNIQUE: Multidetector CT imaging of the chest was performed following the standard protocol without IV contrast. Performed in conjunction with CT of the head and face. COMPARISON:  Chest  radiograph earlier this day. FINDINGS: Cardiovascular: Aortic atherosclerosis and tortuosity. Heart is normal in size. There are coronary artery calcifications. Small amount pericardial fluid or thickening anteriorly. Mediastinum/Nodes: Small mediastinal nodes not enlarged by size criteria. No bulky hilar adenopathy, lack of IV contrast limits assessment. Small hiatal hernia. No visualized thyroid nodule. Lungs/Pleura: No pulmonary nodule, particularly no nodule at the left lung apex to correspond to that questioned on radiograph. There are subclavian artery vascular calcifications in the region of question nodules, likely causing the appearance on radiograph. Minor dependent atelectasis in both lower lobes. The lungs are otherwise clear. No consolidation, pulmonary edema or pleural effusion. Upper Abdomen: No acute findings.  Incidental gallstones. Musculoskeletal: There are no acute or suspicious osseous abnormalities. IMPRESSION: 1. No pulmonary  nodule. The area questioned on radiograph represents calcification in the subclavian artery. 2. No acute chest finding. 3. Aortic Atherosclerosis (ICD10-I70.0). Coronary artery calcifications. Electronically Signed   By: Keith Rake M.D.   On: 03/09/2018 22:55   US Renal  Result Date: 03/10/2018 CLINICAL DATA:  Acute renal failure. EXAM: RENAL / URINARY TRACT ULTRASOUND COMPLETE COMPARISON:  None. FINDINGS: Right Kidney: Length: 12.7 cm. Echogenicity within normal limits. No mass or hydronephrosis visualized. Left Kidney: Length: 12.9 cm. Echogenicity within normal limits. No mass or hydronephrosis visualized. Bladder: Appears normal for degree of bladder distention. IMPRESSION: No cause for the patient's symptoms identified.  Normal study. Electronically Signed   By: Dorise Bullion III M.D   On: 03/10/2018 01:47   Dg Foot Complete Right  Result Date: 03/09/2018 CLINICAL DATA:  Evaluate for infection. EXAM: RIGHT FOOT COMPLETE - 3+ VIEW COMPARISON:  None.  FINDINGS: Vascular calcifications are noted. No fractures. No bony erosion to suggest osteomyelitis. IMPRESSION: Vascular calcifications.  No evidence of osteomyelitis. Electronically Signed   By: Dorise Bullion III M.D   On: 03/09/2018 21:10   Ct Maxillofacial Wo Contrast  Result Date: 03/09/2018 CLINICAL DATA:  77 y/o M; sinusitis, acute (<1HYQ), complications suspected; Altered mental status (AMS), unclear cause. EXAM: CT HEAD WITHOUT CONTRAST CT MAXILLOFACIAL WITHOUT CONTRAST TECHNIQUE: Multidetector CT imaging of the head and maxillofacial structures were performed using the standard protocol without intravenous contrast. Multiplanar CT image reconstructions of the maxillofacial structures were also generated. COMPARISON:  None. FINDINGS: CT HEAD FINDINGS Brain: No evidence of acute infarction, hemorrhage, hydrocephalus, extra-axial collection or mass lesion/mass effect. Small hypodensities within bilateral thalamus, likely chronic infarcts. Nonspecific white matter hypodensities are compatible with moderate chronic microvascular ischemic changes and there is moderate volume loss of the brain. Vascular: Calcific atherosclerosis of carotid siphons and vertebral arteries. No hyperdense vessel identified. Skull: Normal. Negative for fracture or focal lesion. Other: External auditory canal chondral ossification noted. Debris within the external auditory canals is likely cerumen. CT MAXILLOFACIAL FINDINGS Osseous: No fracture or mandibular dislocation. No destructive process. Orbits: Bilateral intra-ocular lens replacement. No acute traumatic or inflammatory finding of the orbital compartments. Sinuses: Mild maxillary sinus mucosal thickening and small mucous retention cyst. Additional paranasal sinuses and the mastoid air cells are normally aerated. Soft tissues: Extensive swelling of the upper lip and superficial nasal soft tissues. No findings of osteomyelitis or discrete fluid collection. IMPRESSION: 1. No  acute intracranial abnormality identified. 2. Moderate chronic microvascular ischemic changes and moderate volume loss of the brain. 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 3. Extensive swelling of upper lip and superficial nasal soft tissues. No findings of osteomyelitis or discrete fluid collection. No extension of inflammation into the deep facial or orbital compartments. No associated dental lesion identified. 4. Mild paranasal sinus disease.  No findings of acute sinusitis. Electronically Signed   By: Kristine Garbe M.D.   On: 03/09/2018 22:46    Scheduled Meds: . amLODipine  10 mg Oral Daily  . atorvastatin  80 mg Oral Q breakfast  . carvedilol  25 mg Oral BID WC  . clonazePAM  0.5 mg Oral Once  . enoxaparin (LOVENOX) injection  30 mg Subcutaneous Q24H  . fluticasone  2 spray Each Nare Daily  . gabapentin  100 mg Oral QHS  . insulin aspart  0-9 Units Subcutaneous TID WC  . insulin NPH Human  12 Units Subcutaneous Q supper  . insulin NPH Human  20 Units Subcutaneous QAC breakfast  . lisinopril  20 mg Oral Daily      Time spent: 25 min  [facility] Pager [phone]. If 7PM-7AM, please contact night-coverage at [email or web address], Office  [phone]  password [email or web address]  03/11/2018, 12:52 PM  LOS: 1 day

## 2018-03-12 LAB — CBC WITH DIFFERENTIAL/PLATELET
Basophils Absolute: 0 10*3/uL (ref 0.0–0.1)
Basophils Relative: 0 %
EOS ABS: 0.2 10*3/uL (ref 0.0–0.5)
EOS PCT: 1 %
HCT: 31.3 % — ABNORMAL LOW (ref 39.0–52.0)
Hemoglobin: 10.5 g/dL — ABNORMAL LOW (ref 13.0–17.0)
LYMPHS ABS: 1 10*3/uL (ref 0.7–4.0)
LYMPHS PCT: 6 %
MCH: 27.6 pg (ref 26.0–34.0)
MCHC: 33.5 g/dL (ref 30.0–36.0)
MCV: 82.4 fL (ref 80.0–100.0)
MONO ABS: 1.4 10*3/uL — AB (ref 0.1–1.0)
MONOS PCT: 8 %
Neutro Abs: 14.2 10*3/uL — ABNORMAL HIGH (ref 1.7–7.7)
Neutrophils Relative %: 85 %
PLATELETS: 310 10*3/uL (ref 150–400)
RBC: 3.8 MIL/uL — AB (ref 4.22–5.81)
RDW: 13.7 % (ref 11.5–15.5)
WBC: 16.8 10*3/uL — ABNORMAL HIGH (ref 4.0–10.5)

## 2018-03-12 LAB — URINE CULTURE

## 2018-03-12 LAB — BASIC METABOLIC PANEL
Anion gap: 9 (ref 5–15)
BUN: 60 mg/dL — ABNORMAL HIGH (ref 8–23)
CHLORIDE: 110 mmol/L (ref 98–111)
CO2: 18 mmol/L — AB (ref 22–32)
CREATININE: 2.5 mg/dL — AB (ref 0.61–1.24)
Calcium: 8.2 mg/dL — ABNORMAL LOW (ref 8.9–10.3)
GFR calc Af Amer: 28 mL/min — ABNORMAL LOW (ref >60)
GFR calc non Af Amer: 24 mL/min — ABNORMAL LOW (ref >60)
GLUCOSE: 213 mg/dL — AB (ref 70–99)
POTASSIUM: 4.2 mmol/L (ref 3.5–5.1)
Sodium: 137 mmol/L (ref 135–145)

## 2018-03-12 LAB — MAGNESIUM: Magnesium: 2.2 mg/dL (ref 1.7–2.4)

## 2018-03-12 LAB — GLUCOSE, CAPILLARY
GLUCOSE-CAPILLARY: 170 mg/dL — AB (ref 70–99)
GLUCOSE-CAPILLARY: 276 mg/dL — AB (ref 70–99)
Glucose-Capillary: 191 mg/dL — ABNORMAL HIGH (ref 70–99)
Glucose-Capillary: 221 mg/dL — ABNORMAL HIGH (ref 70–99)

## 2018-03-12 MED ORDER — INSULIN NPH (HUMAN) (ISOPHANE) 100 UNIT/ML ~~LOC~~ SUSP
25.0000 [IU] | Freq: Every day | SUBCUTANEOUS | Status: DC
  Administered 2018-03-13 – 2018-03-14 (×2): 25 [IU] via SUBCUTANEOUS

## 2018-03-12 MED ORDER — CEFAZOLIN SODIUM-DEXTROSE 1-4 GM/50ML-% IV SOLN
1.0000 g | Freq: Two times a day (BID) | INTRAVENOUS | Status: DC
  Administered 2018-03-12 – 2018-03-14 (×5): 1 g via INTRAVENOUS
  Filled 2018-03-12 (×5): qty 50

## 2018-03-12 NOTE — Evaluation (Signed)
Physical Therapy Evaluation Patient Details Name: Jermaine Mitchell MRN: [id number] DOB: 12-19-1940 Today's Date: 03/12/2018   History of Present Illness  77 year old male with a history of diabetes mellitus, diabetic neuropathy, hypertension came to the hospital 03/09/18 with complaints of malaise decreased appetite, fever.  Also developed facial swelling left more than right.  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 Recent arterial Dopplers  showed moderate peripheral arterial disease   Clinical Impression  The patient presents with significant ataxia during amblation, requiring mod assist for safety and stability. The patient reports that he sometimes uses a cane at baseline. Patient clearly is not at his baseline. No family present. RN reports that wife reported that patient was independent with CBG's/ insulin and self care PTA. Will need further evaluation for gait and safety.  Pt admitted with above diagnosis. Pt currently with functional limitations due to the deficits listed below (see PT Problem List).  Pt will benefit from skilled PT to increase their independence and safety with mobility to allow discharge to the venue listed below.    baseline    Follow Up Recommendations SNF;Supervision/Assistance - 24 hour    Equipment Recommendations  Rolling walker with 5" wheels    Recommendations for Other Services       Precautions / Restrictions Precautions Precautions: Fall      Mobility  Bed Mobility Overal bed mobility: Needs Assistance Bed Mobility: Supine to Sit     Supine to sit: Min guard;Min assist     General bed mobility comments: extra effort for trunk when self assisting, legs tending to flail somewhat  Transfers Overall transfer level: Needs assistance Equipment used: Rolling walker (2 wheeled) Transfers: Sit to/from Stand Sit to Stand: Min assist;Mod assist;+2 safety/equipment         General transfer comment: wide base of  support, trunk titubtion, not holding trunk steady.  Ambulation/Gait Ambulation/Gait assistance: Mod assist;+2 safety/equipment Gait Distance (Feet): 150 Feet Assistive device: Rolling walker (2 wheeled) Gait Pattern/deviations: Staggering right;Staggering left;Ataxic     General Gait Details: patient with noted  atxia, left leg more so than right. Patient's feet moving out from inside RW,  the gait deteriorated the farther that the patient ambulated to point needing to sit down  Stairs            Wheelchair Mobility    Modified Rankin (Stroke Patients Only)       Balance Overall balance assessment: Needs assistance Sitting-balance support: Feet supported;Bilateral upper extremity supported Sitting balance-Leahy Scale: Fair     Standing balance support: Bilateral upper extremity supported;During functional activity Standing balance-Leahy Scale: Zero                               Pertinent Vitals/Pain Pain Assessment: No/denies pain    Home Living Family/patient expects to be discharged to:: Private residence Living Arrangements: Spouse/significant other Available Help at Discharge: Family Type of Home: House Home Access: Stairs to enter   Technical brewer of Steps: 2-3-  Home Layout: One level Home Equipment: Cane - single point      Prior Function Level of Independence: Independent with assistive device(s)               Hand Dominance        Extremity/Trunk Assessment   Upper Extremity Assessment Upper Extremity Assessment: Defer to OT evaluation    Lower Extremity Assessment Lower Extremity Assessment: RLE deficits/detail;LLE deficits/detail RLE  Deficits / Details: decreased control during ambulation, ataxic LLE Deficits / Details: more ataxic than the left    Cervical / Trunk Assessment Cervical / Trunk Assessment: Normal  Communication   Communication: No difficulties  Cognition Arousal/Alertness:  Awake/alert Behavior During Therapy: WFL for tasks assessed/performed Overall Cognitive Status: Impaired/Different from baseline Area of Impairment: Orientation                 Orientation Level: Time             General Comments: generally WFL,       General Comments      Exercises     Assessment/Plan    PT Assessment Patient needs continued PT services  PT Problem List Decreased activity tolerance;Decreased balance;Decreased mobility;Decreased knowledge of precautions;Decreased safety awareness;Decreased knowledge of use of DME       PT Treatment Interventions DME instruction;Gait training;Balance training;Functional mobility training;Therapeutic activities;Patient/family education;Stair training    PT Goals (Current goals can be found in the Care Plan section)  Acute Rehab PT Goals Patient Stated Goal: to go home PT Goal Formulation: With patient Time For Goal Achievement: 03/26/18 Potential to Achieve Goals: Good    Frequency Min 3X/week   Barriers to discharge        Co-evaluation               AM-PAC PT "6 Clicks" Daily Activity  Outcome Measure Difficulty turning over in bed (including adjusting bedclothes, sheets and blankets)?: A Lot Difficulty moving from lying on back to sitting on the side of the bed? : A Lot Difficulty sitting down on and standing up from a chair with arms (e.g., wheelchair, bedside commode, etc,.)?: Unable Help needed moving to and from a bed to chair (including a wheelchair)?: Total Help needed walking in hospital room?: Total Help needed climbing 3-5 steps with a railing? : Total 6 Click Score: 8    End of Session Equipment Utilized During Treatment: Gait belt Activity Tolerance: Patient tolerated treatment well Patient left: in chair;with call bell/phone within reach;with chair alarm set Nurse Communication: Mobility status PT Visit Diagnosis: Unsteadiness on feet (R26.81);Ataxic gait (R26.0)    Time:  9373-4287 PT Time Calculation (min) (ACUTE ONLY): 14 min   Charges:   PT Evaluation $PT Eval Low Complexity: [place] [facility] Acute Rehabilitation Services Pager [phone] Office [phone]   Claretha Cooper 03/12/2018, 4:44 PM

## 2018-03-12 NOTE — Progress Notes (Signed)
Minimal swelling noted[facility]  PROGRESS NOTE  Jermaine Mitchell NWG:[id number] DOB: 04/19/1941 DOA: 03/09/2018 PCP: Mosie Lukes, MD   Brief HPI:   77 year old male with a history of diabetes mellitus, diabetic neuropathy, hypertension came to the hospital with complaints of malaise decreased appetite.  Also developed facial swelling left more than right.  Patient has chronic right third toe diabetic wound which has remained dry.  Had recent arterial Dopplers which showed moderate peripheral arterial disease and patient has an appointment to see vascular surgery as outpatient.  Patient was empirically started on antibiotics for UTI.  CT maxillofacial did not show sinusitis.    Subjective   Patient seen and examined, feels better.  Wants to go home.  Facial swelling has improved.   Assessment/Plan:     1. UTI- urine culture grew Proteus mirabilis, initially started on cefepime.  Urine culture grew Proteus mirabilis which is sensitive to cefazolin.  We will switch cefepime to cefazolin and can be discharged on p.o. Keflex when medically ready.  2. Facial cellulitis- significantly improved. Continue antibiotics as above.  3. Diabetes mellitus type 2- continue NPH, sliding scale insulin with NovoLog.  Blood glucose is elevated  In 200s, will change the dose of NPH insulin to 25 units q am  4. Chronic hypertension-blood pressure is stable after adding lisinopril, continue with Coreg, amlodipine.  5. Diabetic neuropathy-stable, continue gabapentin.  6. Acute kidney injury on CKD stage III-creatinine is slowly improving.  Today creatinine is 2.50.  Follow BMP in am.  7. Right 3rd toe  ulcer -patient has moderate peripheral arterial disease diagnosed on arterial Doppler done last month.  He has an appointment to see vascular surgery as outpatient in first week of November.     CBG: Recent Labs  Lab 03/11/18 1216 03/11/18 1708 03/11/18 2125 03/12/18 0743 03/12/18 1211   GLUCAP 279* 271* 277* 170* 276*    CBC: Recent Labs  Lab 03/09/18 1952 03/10/18 0221 03/11/18 0423 03/12/18 0506  WBC 16.7* 16.0* 17.5* 16.8*  NEUTROABS 14.0* 13.0* 14.6* 14.2*  HGB 12.2* 10.8* 11.3* 10.5*  HCT 35.6* 32.6* 33.7* 31.3*  MCV 81.7 83.2 82.6 82.4  PLT 339 310 315 578    Basic Metabolic Panel: Recent Labs  Lab 03/09/18 1952 03/10/18 0221 03/11/18 0423 03/12/18 0506  NA 140 138 137 137  K 4.8 4.1 4.3 4.2  CL 110 111 109 110  CO2 20* 18* 17* 18*  GLUCOSE 339* 194* 196* 213*  BUN 81* 67* 59* 60*  CREATININE 4.14* 3.64* 2.79* 2.50*  CALCIUM 9.3 8.5* 8.3* 8.2*  MG  --   --  2.1 2.2     DVT prophylaxis: Lovenox  Code Status: Full code  Family Communication: Discussed with patient's wife at bedside  Disposition Plan: home vs SNF, PT consulted   Consultants:  None  Procedures:  None   Antibiotics:   Anti-infectives (From admission, onward)   Start     Dose/Rate Route Frequency Ordered Stop   03/12/18 1000  ceFAZolin (ANCEF) IVPB 1 g/50 mL premix     1 g 100 mL/hr over 30 Minutes Intravenous Every 12 hours 03/12/18 0843     03/10/18 1200  ceFEPIme (MAXIPIME) 1 g in sodium chloride 0.9 % 100 mL IVPB  Status:  Discontinued     1 g 200 mL/hr over 30 Minutes Intravenous Every 12 hours 03/10/18 0021 03/12/18 0843   03/09/18 2145  vancomycin (VANCOCIN) 1,750 mg in sodium chloride 0.9 % 500 mL IVPB  1,750 mg 250 mL/hr over 120 Minutes Intravenous  Once 03/09/18 2137 03/10/18 0146   03/09/18 2145  ceFEPIme (MAXIPIME) 1 g in sodium chloride 0.9 % 100 mL IVPB     1 g 200 mL/hr over 30 Minutes Intravenous NOW 03/09/18 2137 03/09/18 2331       Objective   Vitals:   03/11/18 1334 03/11/18 1757 03/11/18 2122 03/12/18 0523  BP: 113/73 (!) 158/80 (!) 148/69 (!) 163/79  Pulse: 81 85 80 77  Resp: 14 15 19 16   Temp: (!) 97.4 F (36.3 C) 98 F (36.7 C) 97.6 F (36.4 C) 98.1 F (36.7 C)  TempSrc: Oral Oral Oral Oral  SpO2: 99% 99% 99% 99%   Weight:      Height:        Intake/Output Summary (Last 24 hours) at 03/12/2018 1328 Last data filed at 03/12/2018 1059 Gross per 24 hour  Intake 895.79 ml  Output 950 ml  Net -54.21 ml   Filed Weights   03/09/18 1925  Weight: 86.2 kg     Physical Examination:   Neck: Supple, no deformities, masses, or tenderness Face-minimal swelling noted on left side of the face Lungs: Normal respiratory effort, bilateral clear to auscultation, no crackles or wheezes.  Heart: Regular rate and rhythm, S1 and S2 normal, no murmurs, rubs auscultated Abdomen: BS normoactive,soft,nondistended,non-tender to palpation,no organomegaly Extremities: No pretibial edema, no erythema, no cyanosis, no clubbing Neuro : Alert and oriented to time, place and person, No focal deficits  Skin: No rashes seen on exam     Data Reviewed: I have personally reviewed following labs and imaging studies   Recent Results (from the past 240 hour(s))  Culture, Urine     Status: Abnormal   Collection Time: 03/09/18 10:47 PM  Result Value Ref Range Status   Specimen Description   Final    URINE, CLEAN CATCH Performed at [facility] [address] [place], [place] [address]    Special Requests   Final    NONE Performed at [facility], [place] [address], [place], [place] [address]    Culture 80,000 COLONIES/mL PROTEUS MIRABILIS (A)  Final   Report Status 03/12/2018 FINAL  Final   Organism ID, Bacteria PROTEUS MIRABILIS (A)  Final      Susceptibility   Proteus mirabilis - MIC*    AMPICILLIN <=2 SENSITIVE Sensitive     CEFAZOLIN <=4 SENSITIVE Sensitive     CEFTRIAXONE <=1 SENSITIVE Sensitive     CIPROFLOXACIN <=0.25 SENSITIVE Sensitive     GENTAMICIN <=1 SENSITIVE Sensitive     IMIPENEM 2 SENSITIVE Sensitive     NITROFURANTOIN RESISTANT Resistant     TRIMETH/SULFA <=20 SENSITIVE Sensitive     AMPICILLIN/SULBACTAM <=2 SENSITIVE Sensitive     PIP/TAZO <=4 SENSITIVE  Sensitive     * 80,000 COLONIES/mL PROTEUS MIRABILIS  Culture, blood (routine x 2)     Status: None (Preliminary result)   Collection Time: 03/10/18  2:21 AM  Result Value Ref Range Status   Specimen Description   Final    BLOOD RIGHT ANTECUBITAL Performed at [facility] [address], [place], [place] [address]    Special Requests   Final    BOTTLES DRAWN AEROBIC ONLY Blood Culture adequate volume Performed at [facility] [address] [place], [place] [address]    Culture   Final    NO GROWTH 2 DAYS Performed at [facility] [address]  [place], [place] [address]    Report Status PENDING  Incomplete  Culture, blood (routine x 2)     Status: None (Preliminary result)   Collection Time: 03/10/18  2:21 AM  Result Value Ref Range Status   Specimen Description   Final    BLOOD RIGHT FOREARM Performed at [facility] [address] [place], [place] [address]    Special Requests   Final    BOTTLES DRAWN AEROBIC ONLY Blood Culture adequate volume Performed at [facility] [address], [place], [place] [address]    Culture   Final    NO GROWTH 2 DAYS Performed at [facility] [address] [place], [place] [address]    Report Status PENDING  Incomplete     Liver Function Tests: Recent Labs  Lab 03/09/18 1952  AST 52*  ALT 26  ALKPHOS 76  BILITOT 0.7  PROT 8.5*  ALBUMIN 3.0*   No results for input(s): LIPASE, AMYLASE in the last 168 hours. No results for input(s): AMMONIA in the last 168 hours.  Cardiac Enzymes: No results for input(s): CKTOTAL, CKMB, CKMBINDEX, TROPONINI in the last 168 hours. BNP (last 3 results) No results for input(s): BNP in the last 8760 hours.  ProBNP (last 3 results) No results for input(s): PROBNP in the last 8760 hours.    Studies: No results found.  Scheduled Meds: . amLODipine  10 mg Oral Daily  . atorvastatin  80 mg Oral Q  breakfast  . carvedilol  25 mg Oral BID WC  . clonazePAM  0.5 mg Oral Once  . enoxaparin (LOVENOX) injection  30 mg Subcutaneous Q24H  . fluticasone  2 spray Each Nare Daily  . gabapentin  100 mg Oral QHS  . insulin aspart  0-9 Units Subcutaneous TID WC  . insulin NPH Human  12 Units Subcutaneous Q supper  . insulin NPH Human  20 Units Subcutaneous QAC breakfast  . lisinopril  20 mg Oral Daily      Time spent: 25 min  [facility] Pager [phone]. If 7PM-7AM, please contact night-coverage at [email or web address], Office  [phone]  password [place]  03/12/2018, 1:28 PM  LOS: 2 days

## 2018-03-13 DIAGNOSIS — L03211 Cellulitis of face: Principal | ICD-10-CM

## 2018-03-13 DIAGNOSIS — R509 Fever, unspecified: Secondary | ICD-10-CM

## 2018-03-13 DIAGNOSIS — E860 Dehydration: Secondary | ICD-10-CM

## 2018-03-13 DIAGNOSIS — I10 Essential (primary) hypertension: Secondary | ICD-10-CM

## 2018-03-13 DIAGNOSIS — N390 Urinary tract infection, site not specified: Secondary | ICD-10-CM

## 2018-03-13 DIAGNOSIS — N179 Acute kidney failure, unspecified: Secondary | ICD-10-CM

## 2018-03-13 LAB — CBC WITH DIFFERENTIAL/PLATELET
Abs Immature Granulocytes: 0.12 10*3/uL — ABNORMAL HIGH (ref 0.00–0.07)
Basophils Absolute: 0 10*3/uL (ref 0.0–0.1)
Basophils Relative: 0 %
EOS PCT: 2 %
Eosinophils Absolute: 0.2 10*3/uL (ref 0.0–0.5)
HEMATOCRIT: 32.4 % — AB (ref 39.0–52.0)
HEMOGLOBIN: 10.3 g/dL — AB (ref 13.0–17.0)
Immature Granulocytes: 1 %
LYMPHS ABS: 1 10*3/uL (ref 0.7–4.0)
LYMPHS PCT: 8 %
MCH: 26.8 pg (ref 26.0–34.0)
MCHC: 31.8 g/dL (ref 30.0–36.0)
MCV: 84.4 fL (ref 80.0–100.0)
MONO ABS: 1.1 10*3/uL — AB (ref 0.1–1.0)
MONOS PCT: 8 %
Neutro Abs: 10.6 10*3/uL — ABNORMAL HIGH (ref 1.7–7.7)
Neutrophils Relative %: 81 %
Platelets: 315 10*3/uL (ref 150–400)
RBC: 3.84 MIL/uL — ABNORMAL LOW (ref 4.22–5.81)
RDW: 13.5 % (ref 11.5–15.5)
WBC: 13 10*3/uL — ABNORMAL HIGH (ref 4.0–10.5)
nRBC: 0 % (ref 0.0–0.2)

## 2018-03-13 LAB — BASIC METABOLIC PANEL
Anion gap: 11 (ref 5–15)
BUN: 59 mg/dL — AB (ref 8–23)
CHLORIDE: 109 mmol/L (ref 98–111)
CO2: 20 mmol/L — AB (ref 22–32)
CREATININE: 2.41 mg/dL — AB (ref 0.61–1.24)
Calcium: 8.5 mg/dL — ABNORMAL LOW (ref 8.9–10.3)
GFR calc Af Amer: 29 mL/min — ABNORMAL LOW (ref >60)
GFR calc non Af Amer: 25 mL/min — ABNORMAL LOW (ref >60)
Glucose, Bld: 223 mg/dL — ABNORMAL HIGH (ref 70–99)
Potassium: 4.7 mmol/L (ref 3.5–5.1)
SODIUM: 140 mmol/L (ref 135–145)

## 2018-03-13 LAB — GLUCOSE, CAPILLARY
GLUCOSE-CAPILLARY: 183 mg/dL — AB (ref 70–99)
Glucose-Capillary: 199 mg/dL — ABNORMAL HIGH (ref 70–99)
Glucose-Capillary: 292 mg/dL — ABNORMAL HIGH (ref 70–99)
Glucose-Capillary: 208 mg/dL — ABNORMAL HIGH (ref 70–99)

## 2018-03-13 LAB — MAGNESIUM: Magnesium: 2.2 mg/dL (ref 1.7–2.4)

## 2018-03-13 MED ORDER — INSULIN ASPART 100 UNIT/ML ~~LOC~~ SOLN
3.0000 [IU] | Freq: Three times a day (TID) | SUBCUTANEOUS | Status: DC
  Administered 2018-03-13 – 2018-03-14 (×2): 3 [IU] via SUBCUTANEOUS

## 2018-03-13 NOTE — Clinical Social Work Note (Signed)
Clinical Social Work Assessment  Patient Details  Name: Jermaine Mitchell MRN: [id number] Date of Birth: 09/25/1940  Date of referral:  03/13/18               Reason for consult:  Facility Placement, Discharge Planning                Permission sought to share information with:  Facility Sport and exercise psychologist Permission granted to share information::  Yes, Verbal Permission Granted  Name::     [place]::  SNF  Relationship::  Spouse   Contact Information:  [phone]  Housing/Transportation Living arrangements for the past 2 months:  [place] of Information:  Spouse Patient Interpreter Needed:  None Criminal Activity/Legal Involvement Pertinent to Current Situation/Hospitalization:  No - Comment as needed Significant Relationships:  Spouse Lives with:  Spouse Do you feel safe going back to the place where you live?  No Need for family participation in patient care:  Yes   Care giving concerns:   Patient presented with malaise, decreased appetite x 1 week. Because patient was too weak to get out of bed, family activated EMS.  At baseline the patient is able to ambulate with his cane. Per PT evaluation the patient is not yet at his baseline and recommends short rehab.  SNF placement for short rehab.   Social Worker assessment / plan:  CSW met with the patient and spouse at bedside to discuss discharge planning to SNF. Patient in and out of sleep during assessment so with the patient permission CSW deferred to patient spouse Jermaine Mitchell. She reports the patient uses a cane to ambulate. She reports the patient has been showing signs of weakness in his legs since April. "He sometimes stops to use the wall to hold himself." She reports the patient can bathe and dress himself.  Spouse reports this is the patient first time going to a facility but knows family members that have went through the process and work at local skilled facilities in the area.  CSW  explain the SNF process and insurance process again to ensure the patient has a understanding. CSW provided a list of bed offers.  Patient and spouse agreeable to [facility]. [facility] initiated the insurance authorization process.    FL2 completed.  PASRR done.   Plan: SNF  Employment status:  Retired Nurse, adult PT Recommendations:  [place] / Referral to community resources:  [place]  Patient/Family's Response to care:  Agreeable and Responding well to care.   Patient/Family's Understanding of and Emotional Response to Diagnosis, Current Treatment, and Prognosis: Patient in and out of sleep during the assessment. CSW unable to determine patient level of understanding. Patient spouse has a good understanding of the patient diagnosis and follow up care.   Emotional Assessment Appearance:  Developmentally appropriate Attitude/Demeanor/Rapport:    Affect (typically observed):  Calm Orientation:  Oriented to Self, Oriented to Place, Oriented to  Time, Oriented to Situation Alcohol / Substance use:  Not Applicable Psych involvement (Current and /or in the community):  No (Comment)  Discharge Needs  Concerns to be addressed:  Discharge Planning Concerns Readmission within the last 30 days:  No Current discharge risk:  Dependent with Mobility Barriers to Discharge:  Continued Medical Work up   [employer], LCSW 03/13/2018, 11:39 AM

## 2018-03-13 NOTE — NC FL2 (Signed)
[place] LEVEL OF CARE SCREENING TOOL     IDENTIFICATION  Patient Name: Jermaine Mitchell Birthdate: [birth year] Sex: male Admission Date (Current Location): 03/09/2018  [facility] and [place] Number:  Herbalist and Address:  [facility],  [place] [address] [place]      Provider Number: [id number]  Attending Physician Name and Address:  Hosie Poisson, MD  Relative Name and Phone Number:       Current Level of Care: Hospital Recommended Level of Care: [place] Prior Approval Number:    Date Approved/Denied:   PASRR Number:    Discharge Plan: SNF    Current Diagnoses: Patient Active Problem List   Diagnosis Date Noted  . Fever 03/10/2018  . AKI (acute kidney injury) ([place]) 03/10/2018  . Toe ulcer, right ([place]) 02/11/2018  . Cellulitis 02/04/2018  . Elevated PSA 08/13/2016  . Low back pain 08/10/2016  . Urinary hesitancy 08/26/2015  . Onychomycosis 04/04/2015  . Diabetes ([place]) 04/01/2015  . Preventative health care 08/24/2013  . Renal insufficiency 07/25/2012  . Allergic rhinitis 08/25/2008  . 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 NUMBNESS 06/18/2008  . Anemia 06/17/2008  . PROTEINURIA 06/17/2008  . 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 Diabetic peripheral vascular disease ([place]) 06/15/2008  . Hyperlipidemia, mixed 06/15/2008  . Essential hypertension 06/15/2008    Orientation RESPIRATION BLADDER Height & Weight     Self, Time, Situation, Place  Normal Continent Weight: 190 lb (86.2 kg) Height:  5\' 9"  (175.3 cm)  BEHAVIORAL SYMPTOMS/MOOD NEUROLOGICAL BOWEL NUTRITION STATUS      Continent Diet(Carb Modified )  AMBULATORY STATUS COMMUNICATION OF NEEDS Skin   Extensive Assist Verbally Normal                       Personal Care Assistance Level of Assistance  Bathing, Feeding, Dressing Bathing Assistance: Maximum assistance Feeding assistance: Independent Dressing Assistance: Maximum  assistance     Functional Limitations Info  Sight, Hearing, Speech Sight Info: Adequate Hearing Info: Adequate Speech Info: Adequate    SPECIAL CARE FACTORS FREQUENCY  PT (By licensed PT), OT (By licensed OT)     PT Frequency: 5x/week OT Frequency: 5x/week             Contractures Contractures Info: Not present    Additional Factors Info  Code Status, Allergies, Psychotropic, Insulin Sliding Scale Code Status Info: Fullcode Allergies Info: Allergies: Codeine Psychotropic Info: Klonopin, Neurontin  Insulin Sliding Scale Info: Yes, see discharge summary        Current Medications (03/13/2018):  This is the current hospital active medication list Current Facility-Administered Medications  Medication Dose Route Frequency Provider Last Rate Last Dose  . acetaminophen (TYLENOL) tablet 650 mg  650 mg Oral Q6H PRN Park, Derenda Mis, MD       Or  . acetaminophen (TYLENOL) suppository 650 mg  650 mg Rectal Q6H PRN Park, Derenda Mis, MD      . amLODipine (NORVASC) tablet 10 mg  10 mg Oral Daily Colbert Ewing, MD   10 mg at 03/13/18 0958  . atorvastatin (LIPITOR) tablet 80 mg  80 mg Oral Q breakfast Colbert Ewing, MD   80 mg at 03/13/18 0753  . carvedilol (COREG) tablet 25 mg  25 mg Oral BID WC Park, Derenda Mis, MD   25 mg at 03/13/18 0753  . ceFAZolin (ANCEF) IVPB 1 g/50 mL premix  1 g Intravenous Q12H Darrick Meigs, Marge Duncans, MD 100  mL/hr at 03/13/18 0954 1 g at 03/13/18 0954  . clonazePAM (KLONOPIN) tablet 0.5 mg  0.5 mg Oral Once Park, Derenda Mis, MD      . enoxaparin (LOVENOX) injection 30 mg  30 mg Subcutaneous Q24H Colbert Ewing, MD   30 mg at 03/13/18 0954  . fluticasone (FLONASE) 50 MCG/ACT nasal spray 2 spray  2 spray Each Nare Daily Park, Derenda Mis, MD   2 spray at 03/12/18 1012  . gabapentin (NEURONTIN) capsule 100 mg  100 mg Oral QHS Colbert Ewing, MD   100 mg at 03/12/18 2201  . hydrALAZINE (APRESOLINE) tablet 25 mg  25 mg Oral Q6H PRN Oswald Hillock, MD      . insulin aspart  (novoLOG) injection 0-9 Units  0-9 Units Subcutaneous TID WC Park, Derenda Mis, MD   5 Units at 03/13/18 1202  . insulin NPH Human (HUMULIN N,NOVOLIN N) injection 12 Units  12 Units Subcutaneous Q supper Colbert Ewing, MD   12 Units at 03/12/18 1717  . insulin NPH Human (HUMULIN N,NOVOLIN N) injection 25 Units  25 Units Subcutaneous QAC breakfast Oswald Hillock, MD   25 Units at 03/13/18 0756  . lisinopril (PRINIVIL,ZESTRIL) tablet 20 mg  20 mg Oral Daily Oswald Hillock, MD   20 mg at 03/13/18 4035     Discharge Medications: Please see discharge summary for a list of discharge medications.  Relevant Imaging Results:  Relevant Lab Results:   Additional Information CYE:[id number]  Lia Hopping, LCSW

## 2018-03-13 NOTE — Progress Notes (Signed)
PROGRESS NOTE    Jermaine Mitchell  QPY:[id number] DOB: [birth year] DOA: 03/09/2018 PCP: Mosie Lukes, MD    Brief Narrative: 77 year old male with a history of diabetes mellitus, diabetic neuropathy, hypertension came to the hospital with complaints of malaise decreased appetite.  Also developed facial swelling left more than right.  Patient has chronic right third toe diabetic wound which has remained dry.  Had recent arterial Dopplers which showed moderate peripheral arterial disease and patient has an appointment to see vascular surgery as outpatient.  Patient was empirically started on antibiotics for UTI.  CT maxillofacial did not show sinusitis.  Assessment & Plan:   Principal Problem:   Fever Active Problems:   AKI (acute kidney injury) ([place])  Urinary tract infection from Proteus mirabilis Patient was initially started on cefepime later transitioned to IV cefazolin.  Patient can be transitioned to oral Keflex on discharge to complete 7 to 10-day course.   Facial cellulitis Improving but not completely resolved as per the family at bedside the left side facial swelling and lip swelling has gone down only about 50% at this time.  Patient reports persistent pain on the left side of the face and the lip. Recommend at least 10 days of antibiotics.   Type 2 diabetes mellitus CBG (last 3)  Recent Labs    03/12/18 2232 03/13/18 0738 03/13/18 1143  GLUCAP 221* 199* 292*   CBGs slightly elevated add 3 units of NovoLog 3 times daily AC.  And resume NPH insulin.   Acute kidney injury superimposed on stage III CKD Probably secondary to prerenal causes. Improved with hydration, creatinine is 2.4 today.  Continue to monitor.    Right third toe ulcer from moderate peripheral artery disease Patient has an appointment to see vascular surgery as outpatient in November.  Outpatient follow-up with vascular as recommended.   DVT prophylaxis: Lovenox  code Status: Full code Family  Communication: Family at bedside Disposition Plan: SNF when bed available   Consultants:  None Procedures: none.  Antimicrobials: cefazolin.   Subjective:  left face and lip tenderness persistent, swelling has improved.  But not resolved  Objective: Vitals:   03/11/18 2122 03/12/18 0523 03/12/18 1431 03/12/18 2230  BP: (!) 148/69 (!) 163/79 (!) 162/84 130/72  Pulse: 80 77 78 79  Resp: 19 16 16 15   Temp: 97.6 F (36.4 C) 98.1 F (36.7 C) 97.6 F (36.4 C) 97.8 F (36.6 C)  TempSrc: Oral Oral Oral Oral  SpO2: 99% 99% 100% 99%  Weight:      Height:        Intake/Output Summary (Last 24 hours) at 03/13/2018 1349 Last data filed at 03/13/2018 0954 Gross per 24 hour  Intake 667.62 ml  Output 800 ml  Net -132.38 ml   Filed Weights   03/09/18 1925  Weight: 86.2 kg    Examination:  General exam: Appears calm , facial swelling has improved but not resolved. Lip swelling persistent and tender.  Respiratory system: Clear to auscultation. Respiratory effort normal. Cardiovascular system: S1 & S2 heard, RRR. No JVD, . No pedal edema. Gastrointestinal system: Abdomen is nondistended, soft and nontender. . Normal bowel sounds heard. Central nervous system: Alert and oriented. No focal neurological deficits. Extremities: Symmetric 5 x 5 power. Skin: No rashes, lesions or ulcers Psychiatry:  Mood & affect appropriate.     Data Reviewed: I have personally reviewed following labs and imaging studies  CBC: Recent Labs  Lab 03/09/18 1952 03/10/18 0221 03/11/18 0423 03/12/18 1245 03/13/18 0502  WBC 16.7* 16.0* 17.5* 16.8* 13.0*  NEUTROABS 14.0* 13.0* 14.6* 14.2* 10.6*  HGB 12.2* 10.8* 11.3* 10.5* 10.3*  HCT 35.6* 32.6* 33.7* 31.3* 32.4*  MCV 81.7 83.2 82.6 82.4 84.4  PLT 339 310 315 310 546   Basic Metabolic Panel: Recent Labs  Lab 03/09/18 1952 03/10/18 0221 03/11/18 0423 03/12/18 0506 03/13/18 0502  NA 140 138 137 137 140  K 4.8 4.1 4.3 4.2 4.7  CL 110 111 109  110 109  CO2 20* 18* 17* 18* 20*  GLUCOSE 339* 194* 196* 213* 223*  BUN 81* 67* 59* 60* 59*  CREATININE 4.14* 3.64* 2.79* 2.50* 2.41*  CALCIUM 9.3 8.5* 8.3* 8.2* 8.5*  MG  --   --  2.1 2.2 2.2   GFR: Estimated Creatinine Clearance: 29.2 mL/min (A) (by C-G formula based on SCr of 2.41 mg/dL (H)). Liver Function Tests: Recent Labs  Lab 03/09/18 1952  AST 52*  ALT 26  ALKPHOS 76  BILITOT 0.7  PROT 8.5*  ALBUMIN 3.0*   No results for input(s): LIPASE, AMYLASE in the last 168 hours. No results for input(s): AMMONIA in the last 168 hours. Coagulation Profile: No results for input(s): INR, PROTIME in the last 168 hours. Cardiac Enzymes: No results for input(s): CKTOTAL, CKMB, CKMBINDEX, TROPONINI in the last 168 hours. BNP (last 3 results) No results for input(s): PROBNP in the last 8760 hours. HbA1C: No results for input(s): HGBA1C in the last 72 hours. CBG: Recent Labs  Lab 03/12/18 1211 03/12/18 1710 03/12/18 2232 03/13/18 0738 03/13/18 1143  GLUCAP 276* 191* 221* 199* 292*   Lipid Profile: No results for input(s): CHOL, HDL, LDLCALC, TRIG, CHOLHDL, LDLDIRECT in the last 72 hours. Thyroid Function Tests: No results for input(s): TSH, T4TOTAL, FREET4, T3FREE, THYROIDAB in the last 72 hours. Anemia Panel: No results for input(s): VITAMINB12, FOLATE, FERRITIN, TIBC, IRON, RETICCTPCT in the last 72 hours. Sepsis Labs: No results for input(s): PROCALCITON, LATICACIDVEN in the last 168 hours.  Recent Results (from the past 240 hour(s))  Culture, Urine     Status: Abnormal   Collection Time: 03/09/18 10:47 PM  Result Value Ref Range Status   Specimen Description   Final    URINE, CLEAN CATCH Performed at [facility], [place] [address], [place], [place] [address]    Special Requests   Final    NONE Performed at [facility], [place] [address], [place], [place] [address]    Culture 80,000 COLONIES/mL PROTEUS MIRABILIS (A)  Final    Report Status 03/12/2018 FINAL  Final   Organism ID, Bacteria PROTEUS MIRABILIS (A)  Final      Susceptibility   Proteus mirabilis - MIC*    AMPICILLIN <=2 SENSITIVE Sensitive     CEFAZOLIN <=4 SENSITIVE Sensitive     CEFTRIAXONE <=1 SENSITIVE Sensitive     CIPROFLOXACIN <=0.25 SENSITIVE Sensitive     GENTAMICIN <=1 SENSITIVE Sensitive     IMIPENEM 2 SENSITIVE Sensitive     NITROFURANTOIN RESISTANT Resistant     TRIMETH/SULFA <=20 SENSITIVE Sensitive     AMPICILLIN/SULBACTAM <=2 SENSITIVE Sensitive     PIP/TAZO <=4 SENSITIVE Sensitive     * 80,000 COLONIES/mL PROTEUS MIRABILIS  Culture, blood (routine x 2)     Status: None (Preliminary result)   Collection Time: 03/10/18  2:21 AM  Result Value Ref Range Status   Specimen Description   Final    BLOOD RIGHT ANTECUBITAL Performed at [facility] [address] [place],  [place] [address]    Special Requests   Final    BOTTLES DRAWN AEROBIC ONLY Blood Culture adequate volume Performed at [facility] [address], [place], [place] [address]    Culture   Final    NO GROWTH 3 DAYS Performed at [facility], [place] [address], [place], [place] [address]    Report Status PENDING  Incomplete  Culture, blood (routine x 2)     Status: None (Preliminary result)   Collection Time: 03/10/18  2:21 AM  Result Value Ref Range Status   Specimen Description   Final    BLOOD RIGHT FOREARM Performed at [facility] [address], [place], [place] [address]    Special Requests   Final    BOTTLES DRAWN AEROBIC ONLY Blood Culture adequate volume Performed at [facility] [address], [place], [place] [address]    Culture   Final    NO GROWTH 3 DAYS Performed at [facility], [place] [address], [place], [place] [address]    Report Status PENDING  Incomplete         Radiology Studies: No results found.      Scheduled Meds: . amLODipine  10  mg Oral Daily  . atorvastatin  80 mg Oral Q breakfast  . carvedilol  25 mg Oral BID WC  . clonazePAM  0.5 mg Oral Once  . enoxaparin (LOVENOX) injection  30 mg Subcutaneous Q24H  . fluticasone  2 spray Each Nare Daily  . gabapentin  100 mg Oral QHS  . insulin aspart  0-9 Units Subcutaneous TID WC  . insulin NPH Human  12 Units Subcutaneous Q supper  . insulin NPH Human  25 Units Subcutaneous QAC breakfast  . lisinopril  20 mg Oral Daily   Continuous Infusions: .  ceFAZolin (ANCEF) IV 1 g (03/13/18 0954)     LOS: 3 days    Time spent: 35 minutes.     Hosie Poisson, MD [facility] Pager [phone]  If 7PM-7AM, please contact night-coverage [email or web address] Password [email or web address] 03/13/2018, 1:49 PM

## 2018-03-13 NOTE — Care Management Important Message (Signed)
Important Message  Patient Details  Name: Jermaine Mitchell MRN: [id number] Date of Birth: 1941-04-17   Medicare Important Message Given:  Yes    Kerin Salen 03/13/2018, 10:28 AMImportant Message  Patient Details  Name: Jermaine Mitchell MRN: [id number] Date of Birth: 1940/10/28   Medicare Important Message Given:  Yes    Kerin Salen 03/13/2018, 10:28 AM

## 2018-03-13 NOTE — Progress Notes (Signed)
Physical Therapy Treatment Patient Details Name: Jermaine Mitchell MRN: [id number] DOB: December 01, 1940 Today's Date: 03/13/2018    History of Present Illness 77 year old male with a history of diabetes mellitus, diabetic neuropathy, hypertension came to the hospital 03/09/18 with complaints of malaise decreased appetite, fever.  Also developed facial swelling left more than right.  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 Recent arterial Dopplers  showed moderate peripheral arterial disease     PT Comments    The patient continues to exhibit ataxia and unsteady gait. Patient's wife reports that patient walks in home while holding to walls and objects. Recommend post acute rehab.    Follow Up Recommendations  SNF;Supervision/Assistance - 24 hour     Equipment Recommendations  Rolling walker with 5" wheels    Recommendations for Other Services       Precautions / Restrictions Precautions Precautions: Fall Restrictions Weight Bearing Restrictions: No    Mobility  Bed Mobility         Supine to sit: Min guard     General bed mobility comments: for safety. Pt moves very quickly.  Dizzy upon first sitting up  Transfers   Equipment used: Rolling walker (2 wheeled)   Sit to Stand: Min assist;+2 safety/equipment         General transfer comment: Assist to rise and steady. Cues for hand placement  Ambulation/Gait Ambulation/Gait assistance: Mod assist;+2 safety/equipment Gait Distance (Feet): 90 Feet(x 2) Assistive device: Rolling walker (2 wheeled) Gait Pattern/deviations: Staggering right;Staggering left;Ataxic;Step-to pattern;Step-through pattern Gait velocity: too fast for safety   General Gait Details: patient with noted  atxia, left leg more so than right. Multimodal cues for patient to slow down, required assist to prevent pushing RW to fast. Patient's gait deteriorates with distance and required a rest break. as became more  attaxic.   Stairs             Wheelchair Mobility    Modified Rankin (Stroke Patients Only)       Balance Overall balance assessment: Needs assistance Sitting-balance support: Feet supported;Bilateral upper extremity supported Sitting balance-Leahy Scale: Fair       Standing balance-Leahy Scale: Poor                              Cognition Arousal/Alertness: Awake/alert Behavior During Therapy: Impulsive Overall Cognitive Status: Impaired/Different from baseline                                 General Comments:  Tends to deny problems (states no neuropathy), decreased safety, moves quickly      Exercises      General Comments        Pertinent Vitals/Pain Pain Assessment: No/denies pain    Home Living Family/patient expects to be discharged to:: Unsure Living Arrangements: Spouse/significant other           Home Equipment: Cane - single point Additional Comments: pt stood in shower    Prior Function Level of Independence: Independent with assistive device(s)          PT Goals (current goals can now be found in the care plan section) Acute Rehab PT Goals Patient Stated Goal: to go home Progress towards PT goals: Progressing toward goals    Frequency    Min 2X/week      PT Plan Current plan remains appropriate;Frequency needs to be updated  Co-evaluation PT/OT/SLP Co-Evaluation/Treatment: Yes Reason for Co-Treatment: For patient/therapist safety PT goals addressed during session: Mobility/safety with mobility OT goals addressed during session: ADL's and self-care      AM-PAC PT "6 Clicks" Daily Activity  Outcome Measure  Difficulty turning over in bed (including adjusting bedclothes, sheets and blankets)?: A Lot Difficulty moving from lying on back to sitting on the side of the bed? : A Lot Difficulty sitting down on and standing up from a chair with arms (e.g., wheelchair, bedside commode, etc,.)?: A  Lot Help needed moving to and from a bed to chair (including a wheelchair)?: Total Help needed walking in hospital room?: Total Help needed climbing 3-5 steps with a railing? : Total 6 Click Score: 9    End of Session Equipment Utilized During Treatment: Gait belt Activity Tolerance: Patient tolerated treatment well Patient left: in chair;with call bell/phone within reach;with family/visitor present;with chair alarm set Nurse Communication: Mobility status PT Visit Diagnosis: Unsteadiness on feet (R26.81);Ataxic gait (R26.0)     Time: 7371-0626 PT Time Calculation (min) (ACUTE ONLY): 20 min  Charges:  $Gait Training: 8-22 mins                      [place] Pager [phone] Office [phone]    Claretha Cooper 03/13/2018, 1:00 PM

## 2018-03-13 NOTE — Evaluation (Signed)
Occupational Therapy Evaluation Patient Details Name: Jermaine Mitchell MRN: [id number] DOB: 04-May-1941 Today's Date: 03/13/2018    History of Present Illness 77 year old male with a history of diabetes mellitus, diabetic neuropathy, hypertension came to the hospital 03/09/18 with complaints of malaise decreased appetite, fever.  Also developed facial swelling left more than right.  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 Recent arterial Dopplers  showed moderate peripheral arterial disease    Clinical Impression   Pt was admitted for the above. At baseline, he is mod I for adls and he tends to hold to furniture around home.  Pt was impulsive with decreased awareness of fatique/needing to sit during evaluation.  A RW is new to him, and he improved with use, but +2 needed for safety due to impulsivity. Goals are for min guard to min A in acute setting. Needs needs min/mod A +1 sit to stand +2 for ambulating at this time.      Follow Up Recommendations  SNF    Equipment Recommendations  3 in 1 bedside commode    Recommendations for Other Services       Precautions / Restrictions Precautions Precautions: Fall Restrictions Weight Bearing Restrictions: No      Mobility Bed Mobility         Supine to sit: Min guard     General bed mobility comments: for safety. Pt moves very quickly.  Dizzy upon first sitting up  Transfers   Equipment used: Rolling walker (2 wheeled)   Sit to Stand: Min assist;+2 safety/equipment         General transfer comment: Assist to rise and steady. Cues for hand placement    Balance     Sitting balance-Leahy Scale: Fair       Standing balance-Leahy Scale: Poor                             ADL either performed or assessed with clinical judgement   ADL Overall ADL's : Needs assistance/impaired Eating/Feeding: Independent   Grooming: Wash/dry hands;Wash/dry face;Min guard;Standing   Upper Body  Bathing: Set up   Lower Body Bathing: Minimal assistance;Moderate assistance;Sit to/from stand   Upper Body Dressing : Minimal assistance   Lower Body Dressing: Minimal assistance;Moderate assistance;Sit to/from stand   Toilet Transfer: Minimal assistance;Moderate assistance;Ambulation;BSC;RW             General ADL Comments: multimodal cues and assist to keep walker at good distance. Improved over time. Pt impulsive, need +2 safety.  Ataxic when  Walking.  Decreased awareness of muscle fatique/need to sit     Vision         Perception     Praxis      Pertinent Vitals/Pain Pain Assessment: No/denies pain     Hand Dominance     Extremity/Trunk Assessment Upper Extremity Assessment Upper Extremity Assessment: Overall WFL for tasks assessed           Communication Communication Communication: No difficulties   Cognition Arousal/Alertness: Awake/alert Behavior During Therapy: Impulsive Overall Cognitive Status: Impaired/Different from baseline                                 General Comments:  Tends to deny problems (states no neuropathy), decreased safety, moves quickly   General Comments       Exercises     Shoulder Instructions  Home Living Family/patient expects to be discharged to:: Unsure Living Arrangements: Spouse/significant other                 Bathroom Shower/Tub: Occupational psychologist: Handicapped height     Home Equipment: [place] - single point   Additional Comments: pt stood in shower      Prior Functioning/Environment Level of Independence: Independent with assistive device(s)                 OT Problem List: Decreased activity tolerance;Impaired balance (sitting and/or standing);Decreased safety awareness;Decreased cognition;Pain;Decreased knowledge of use of DME or AE      OT Treatment/Interventions: Self-care/ADL training;DME and/or AE instruction;Therapeutic activities;Patient/family  education;Balance training;energy conservation    OT Goals(Current goals can be found in the care plan section) Acute Rehab OT Goals Patient Stated Goal: to go home OT Goal Formulation: With patient/family Time For Goal Achievement: 03/27/18 Potential to Achieve Goals: Good ADL Goals Pt Will Transfer to Toilet: with min assist;ambulating;bedside commode Additional ADL Goal #1: pt will perform ADL with set up for UB and min guard for LB, sit to stand without safety cues Additional ADL Goal #2: Pt will initiate rest break when needed without cues  OT Frequency: Min 2X/week   Barriers to D/C:            Co-evaluation PT/OT/SLP Co-Evaluation/Treatment: Yes Reason for Co-Treatment: For patient/therapist safety PT goals addressed during session: Mobility/safety with mobility OT goals addressed during session: ADL's and self-care      AM-PAC PT "6 Clicks" Daily Activity     Outcome Measure Help from another person eating meals?: None Help from another person taking care of personal grooming?: A Little Help from another person toileting, which includes using toliet, bedpan, or urinal?: A Lot Help from another person bathing (including washing, rinsing, drying)?: A Lot Help from another person to put on and taking off regular upper body clothing?: A Little Help from another person to put on and taking off regular lower body clothing?: A Lot 6 Click Score: 16   End of Session    Activity Tolerance: Patient tolerated treatment well Patient left: in chair;with call bell/phone within reach;with chair alarm set;with family/visitor present  OT Visit Diagnosis: Unsteadiness on feet (R26.81);Muscle weakness (generalized) (M62.81)                Time: 2947-6546 OT Time Calculation (min): 24 min Charges:  OT General Charges $OT Visit: 1 Visit OT Evaluation $OT Eval Low Complexity: [place], OTR/L Acute Rehabilitation Services [phone] [facility] pager [phone]  office 03/13/2018  [name] 03/13/2018, 12:41 PM

## 2018-03-13 NOTE — Plan of Care (Signed)
  Problem: Health Behavior/Discharge Planning: Goal: Ability to manage health-related needs will improve Outcome: Progressing   Problem: Clinical Measurements: Goal: Ability to maintain clinical measurements within normal limits will improve Outcome: Progressing   Problem: Activity: Goal: Risk for activity intolerance will decrease Outcome: Progressing   Problem: Elimination: Goal: Will not experience complications related to bowel motility Outcome: Progressing   Problem: Safety: Goal: Ability to remain free from injury will improve Outcome: Progressing   Problem: Skin Integrity: Goal: Risk for impaired skin integrity will decrease Outcome: Progressing

## 2018-03-14 DIAGNOSIS — E11621 Type 2 diabetes mellitus with foot ulcer: Secondary | ICD-10-CM | POA: Diagnosis not present

## 2018-03-14 DIAGNOSIS — R509 Fever, unspecified: Secondary | ICD-10-CM | POA: Diagnosis not present

## 2018-03-14 DIAGNOSIS — E1151 Type 2 diabetes mellitus with diabetic peripheral angiopathy without gangrene: Secondary | ICD-10-CM | POA: Diagnosis not present

## 2018-03-14 DIAGNOSIS — E1140 Type 2 diabetes mellitus with diabetic neuropathy, unspecified: Secondary | ICD-10-CM | POA: Diagnosis not present

## 2018-03-14 DIAGNOSIS — L0390 Cellulitis, unspecified: Secondary | ICD-10-CM | POA: Diagnosis not present

## 2018-03-14 DIAGNOSIS — R41841 Cognitive communication deficit: Secondary | ICD-10-CM | POA: Diagnosis not present

## 2018-03-14 DIAGNOSIS — L97519 Non-pressure chronic ulcer of other part of right foot with unspecified severity: Secondary | ICD-10-CM | POA: Diagnosis not present

## 2018-03-14 DIAGNOSIS — E860 Dehydration: Secondary | ICD-10-CM | POA: Diagnosis not present

## 2018-03-14 DIAGNOSIS — I739 Peripheral vascular disease, unspecified: Secondary | ICD-10-CM | POA: Diagnosis not present

## 2018-03-14 DIAGNOSIS — N390 Urinary tract infection, site not specified: Secondary | ICD-10-CM | POA: Diagnosis not present

## 2018-03-14 DIAGNOSIS — E1139 Type 2 diabetes mellitus with other diabetic ophthalmic complication: Secondary | ICD-10-CM | POA: Diagnosis not present

## 2018-03-14 DIAGNOSIS — I10 Essential (primary) hypertension: Secondary | ICD-10-CM | POA: Diagnosis not present

## 2018-03-14 DIAGNOSIS — M6281 Muscle weakness (generalized): Secondary | ICD-10-CM | POA: Diagnosis not present

## 2018-03-14 DIAGNOSIS — I96 Gangrene, not elsewhere classified: Secondary | ICD-10-CM | POA: Diagnosis not present

## 2018-03-14 DIAGNOSIS — E1122 Type 2 diabetes mellitus with diabetic chronic kidney disease: Secondary | ICD-10-CM | POA: Diagnosis not present

## 2018-03-14 DIAGNOSIS — N179 Acute kidney failure, unspecified: Secondary | ICD-10-CM | POA: Diagnosis not present

## 2018-03-14 DIAGNOSIS — I70235 Atherosclerosis of native arteries of right leg with ulceration of other part of foot: Secondary | ICD-10-CM | POA: Diagnosis not present

## 2018-03-14 DIAGNOSIS — E119 Type 2 diabetes mellitus without complications: Secondary | ICD-10-CM | POA: Diagnosis not present

## 2018-03-14 DIAGNOSIS — B964 Proteus (mirabilis) (morganii) as the cause of diseases classified elsewhere: Secondary | ICD-10-CM | POA: Diagnosis not present

## 2018-03-14 DIAGNOSIS — N183 Chronic kidney disease, stage 3 (moderate): Secondary | ICD-10-CM | POA: Diagnosis not present

## 2018-03-14 DIAGNOSIS — L03211 Cellulitis of face: Secondary | ICD-10-CM | POA: Diagnosis not present

## 2018-03-14 LAB — BASIC METABOLIC PANEL
Anion gap: 10 (ref 5–15)
BUN: 53 mg/dL — ABNORMAL HIGH (ref 8–23)
CHLORIDE: 112 mmol/L — AB (ref 98–111)
CO2: 19 mmol/L — ABNORMAL LOW (ref 22–32)
Calcium: 8.4 mg/dL — ABNORMAL LOW (ref 8.9–10.3)
Creatinine, Ser: 2.13 mg/dL — ABNORMAL HIGH (ref 0.61–1.24)
GFR calc non Af Amer: 29 mL/min — ABNORMAL LOW (ref >60)
GFR, EST AFRICAN AMERICAN: 33 mL/min — AB (ref >60)
Glucose, Bld: 61 mg/dL — ABNORMAL LOW (ref 70–99)
POTASSIUM: 4.1 mmol/L (ref 3.5–5.1)
SODIUM: 141 mmol/L (ref 135–145)

## 2018-03-14 LAB — GLUCOSE, CAPILLARY
GLUCOSE-CAPILLARY: 60 mg/dL — AB (ref 70–99)
GLUCOSE-CAPILLARY: 106 mg/dL — AB (ref 70–99)
Glucose-Capillary: 90 mg/dL (ref 70–99)
Glucose-Capillary: 225 mg/dL — ABNORMAL HIGH (ref 70–99)

## 2018-03-14 LAB — CBC WITH DIFFERENTIAL/PLATELET
Abs Immature Granulocytes: 0.14 10*3/uL — ABNORMAL HIGH (ref 0.00–0.07)
BASOS ABS: 0 10*3/uL (ref 0.0–0.1)
BASOS PCT: 0 %
EOS PCT: 2 %
Eosinophils Absolute: 0.3 10*3/uL (ref 0.0–0.5)
HCT: 34.6 % — ABNORMAL LOW (ref 39.0–52.0)
Hemoglobin: 10.9 g/dL — ABNORMAL LOW (ref 13.0–17.0)
Immature Granulocytes: 1 %
LYMPHS PCT: 9 %
Lymphs Abs: 1.1 10*3/uL (ref 0.7–4.0)
MCH: 27.1 pg (ref 26.0–34.0)
MCHC: 31.5 g/dL (ref 30.0–36.0)
MCV: 86.1 fL (ref 80.0–100.0)
MONO ABS: 1 10*3/uL (ref 0.1–1.0)
Monocytes Relative: 8 %
NEUTROS ABS: 9.7 10*3/uL — AB (ref 1.7–7.7)
Neutrophils Relative %: 80 %
Platelets: 339 10*3/uL (ref 150–400)
RBC: 4.02 MIL/uL — AB (ref 4.22–5.81)
RDW: 13.7 % (ref 11.5–15.5)
WBC: 12.3 10*3/uL — AB (ref 4.0–10.5)
nRBC: 0 % (ref 0.0–0.2)

## 2018-03-14 LAB — MAGNESIUM: Magnesium: 2.2 mg/dL (ref 1.7–2.4)

## 2018-03-14 MED ORDER — ENOXAPARIN SODIUM 40 MG/0.4ML ~~LOC~~ SOLN
40.0000 mg | SUBCUTANEOUS | Status: DC
  Administered 2018-03-14: 40 mg via SUBCUTANEOUS
  Filled 2018-03-14: qty 0.4

## 2018-03-14 MED ORDER — CEPHALEXIN 500 MG PO CAPS: 500.0000 mg | ORAL_CAPSULE | Freq: Two times a day (BID) | ORAL | 0 refills | Status: AC

## 2018-03-14 MED ORDER — FLUTICASONE PROPIONATE 50 MCG/ACT NA SUSP: 2.0000 | Freq: Every day | NASAL | 1 refills | Status: DC

## 2018-03-14 MED ORDER — PREDNISONE 20 MG PO TABS
60.0000 mg | ORAL_TABLET | Freq: Once | ORAL | Status: AC
  Administered 2018-03-14: 60 mg via ORAL
  Filled 2018-03-14: qty 3

## 2018-03-14 NOTE — Progress Notes (Signed)
Physical Therapy Treatment Patient Details Name: Jermaine Mitchell MRN: [id number] DOB: 28-Dec-1940 Today's Date: 03/14/2018    History of Present Illness 77 year old male with a history of diabetes mellitus, diabetic neuropathy, hypertension came to the hospital 03/09/18 with complaints of malaise decreased appetite, fever.  Also developed facial swelling left more than right.  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 Recent arterial Dopplers  showed moderate peripheral arterial disease     PT Comments    Pt assisted with ambulating in hallway and required mod assist to correct LOB.  Pt also performed LE exercises.  Pt anticipates d/c to SNF later today.  Follow Up Recommendations  SNF;Supervision/Assistance - 24 hour     Equipment Recommendations  Rolling walker with 5" wheels    Recommendations for Other Services       Precautions / Restrictions Precautions Precautions: Fall    Mobility  Bed Mobility Overal bed mobility: Needs Assistance Bed Mobility: Supine to Sit     Supine to sit: Min guard        Transfers Overall transfer level: Needs assistance Equipment used: Rolling walker (2 wheeled) Transfers: Sit to/from Stand Sit to Stand: Min assist;+2 safety/equipment         General transfer comment: assist to steady with rise, verbal cues for hand placement  Ambulation/Gait Ambulation/Gait assistance: Mod assist;+2 safety/equipment Gait Distance (Feet): 100 Feet Assistive device: Rolling walker (2 wheeled) Gait Pattern/deviations: Staggering right;Staggering left;Ataxic;Step-through pattern     General Gait Details: pt continues to demonstrate ataxia, improved with cues to slow down, one instance of LOB (narrowed BOS and looking around environment which lead to scissoring and LOB) requiring at least mod assist to prevent fall, pt required seated rest break due to LE fatigue (therapist cued for rest break, pt would have continued  ambulating) 100 x1 and then 80 x1   Stairs             Wheelchair Mobility    Modified Rankin (Stroke Patients Only)       Balance                                            Cognition Arousal/Alertness: Awake/alert Behavior During Therapy: Impulsive Overall Cognitive Status: Within Functional Limits for tasks assessed                                 General Comments: "Tends to deny problems (states no neuropathy), decreased safety, moves quickly" per last note; 03/14/18 pt listened to cues provided before mobilizing as well as during mobility      Exercises Total Joint Exercises Ankle Circles/Pumps: AROM;10 reps;Both Heel Slides: AROM;10 reps;Both;Supine Hip ABduction/ADduction: AROM;Supine;10 reps;Both Straight Leg Raises: Supine;10 reps;AROM;Both Long Arc Quad: AROM;10 reps;Both;Seated Marching in Standing: AROM;10 reps;Seated;Both    General Comments        Pertinent Vitals/Pain Pain Assessment: No/denies pain    Home Living                      Prior Function            PT Goals (current goals can now be found in the care plan section) Progress towards PT goals: Progressing toward goals    Frequency    Min 2X/week      PT  Plan Current plan remains appropriate    Co-evaluation              AM-PAC PT "6 Clicks" Daily Activity  Outcome Measure  Difficulty turning over in bed (including adjusting bedclothes, sheets and blankets)?: A Little Difficulty moving from lying on back to sitting on the side of the bed? : A Little Difficulty sitting down on and standing up from a chair with arms (e.g., wheelchair, bedside commode, etc,.)?: Unable Help needed moving to and from a bed to chair (including a wheelchair)?: A Little Help needed walking in hospital room?: Total Help needed climbing 3-5 steps with a railing? : Total 6 Click Score: 12    End of Session Equipment Utilized During Treatment: Gait  belt Activity Tolerance: Patient tolerated treatment well Patient left: in chair;with chair alarm set;with call bell/phone within reach   PT Visit Diagnosis: Unsteadiness on feet (R26.81);Ataxic gait (R26.0)     Time: 1133-1150 PT Time Calculation (min) (ACUTE ONLY): 17 min  Charges:  $Gait Training: 8-22 mins                     Carmelia Bake, PT, DPT Acute Rehabilitation Services Office: [phone] Pager: [phone]  Trena Platt 03/14/2018, 1:06 PM

## 2018-03-14 NOTE — Progress Notes (Signed)
Pt's CBG 60 this am. Given juice and wife ordering breakfast. Will recheck in 15 minutes.

## 2018-03-14 NOTE — Plan of Care (Signed)
  Problem: Health Behavior/Discharge Planning: Goal: Ability to manage health-related needs will improve Outcome: Progressing   Problem: Clinical Measurements: Goal: Ability to maintain clinical measurements within normal limits will improve Outcome: Progressing Goal: Will remain free from infection Outcome: Progressing Goal: Diagnostic test results will improve Outcome: Progressing   Problem: Activity: Goal: Risk for activity intolerance will decrease Outcome: Progressing   Problem: Elimination: Goal: Will not experience complications related to bowel motility Outcome: Progressing   Problem: Safety: Goal: Ability to remain free from injury will improve Outcome: Progressing   Problem: Skin Integrity: Goal: Risk for impaired skin integrity will decrease Outcome: Progressing

## 2018-03-14 NOTE — Progress Notes (Signed)
Pt's CBG rechecked with a result of 90. Pt preparing to eat breakfast.

## 2018-03-14 NOTE — Progress Notes (Signed)
Inpatient Diabetes Program Recommendations  AACE/ADA: New Consensus Statement on Inpatient Glycemic Control (2015)  Target Ranges:  Prepandial:   less than 140 mg/dL      Peak postprandial:   less than 180 mg/dL (1-2 hours)      Critically ill patients:  140 - 180 mg/dL   Lab Results  Component Value Date   GLUCAP 225 (H) 03/14/2018   HGBA1C 9.1 (H) 01/30/2018    Review of Glycemic Control  Hypoglycemia this am with blood sugar of 60, 61 mg/dL. May need to decrease PM NPH dose. Post-prandials elevated. Increase meal coverage insulin.  Inpatient Diabetes Program Recommendations:     Decrease NPH to 8 units in PM. Increase Novolog to 5 units tidwc.  Will continue to follow.  Thank you. Lorenda Peck, RD, LDN, CDE Inpatient Diabetes Coordinator [phone]

## 2018-03-14 NOTE — Clinical Social Work Placement (Addendum)
D/C Summary sent. Nurse call report to:  [phone] [facility] arranged for transport,   CLINICAL SOCIAL WORK PLACEMENT  NOTE  Date:  03/14/2018  Patient Details  Name: Xane Amsden MRN: [id number] Date of Birth: 06/29/1940  Clinical Social Work is seeking post-discharge placement for this patient at the [facility] level of care (*CSW will initial, date and re-position this form in  chart as items are completed):  Yes   Patient/family provided with [place] Work Department's list of facilities offering this level of care within the geographic area requested by the patient (or if unable, by the patient's family).  Yes   Patient/family informed of their freedom to choose among providers that offer the needed level of care, that participate in Medicare, Medicaid or managed care program needed by the patient, have an available bed and are willing to accept the patient.  Yes   Patient/family informed of [place]'s ownership interest in [facility] and [facility], as well as of the fact that they are under no obligation to receive care at these facilities.  PASRR submitted to EDS on 03/14/18     PASRR number received on 03/14/18     Existing PASRR number confirmed on       FL2 transmitted to all facilities in geographic area requested by pt/family on       FL2 transmitted to all facilities within larger geographic area on 03/14/18     Patient informed that his/her managed care company has contracts with or will negotiate with certain facilities, including the following:  [facility]         Patient/family informed of bed offers received.  Patient chooses bed at [facility]     Physician recommends and patient chooses bed at      Patient to be transferred to [facility] on 03/14/18.  Patient to be transferred to facility by [facility]      Patient family notified on 03/14/18 of transfer.  Name  of family member notified:  Spouse at bedside.      PHYSICIAN       Additional Comment:    _______________________________________________ Lia Hopping, LCSW 03/14/2018, 2:14 PM

## 2018-03-14 NOTE — Discharge Summary (Addendum)
Physician Discharge Summary  Jermaine Mitchell PIR:[id number] DOB: 1940/07/27 DOA: 03/09/2018  PCP: Mosie Lukes, MD  Admit date: 03/09/2018 Discharge date: 03/14/2018  Admitted From: Home  Disposition:  SNF   Recommendations for Outpatient Follow-up:  1. Follow up with PCP in 1-2 weeks 2. Please obtain BMP/CBC1 to 2 days.  Please follow up with  Nephrology in 2 weeks.  We have stopped your lisinopril, for your lip swelling and ARF. Please resume it after discussing with PCP.     Discharge Condition:stable.  CODE STATUS: full code.  Diet recommendation: Heart Healthy    Brief/Interim Summary: 77 year old male with a history of diabetes mellitus, diabetic neuropathy, hypertension came to the hospital with complaints of malaise decreased appetite. Also developed facial swelling left more than right. Patient has chronic right third toe diabetic wound which has remained dry. Had recent arterial Dopplers which showed moderate peripheral arterial disease and patient has an appointment to see vascular surgery as outpatient. Patient was empirically started on antibiotics for UTI. CT maxillofacial did not show sinusitis. Discharge Diagnoses:  Principal Problem:   Fever Active Problems:   AKI (acute kidney injury) ([place])  Urinary tract infection from Proteus mirabilis Patient was initially started on cefepime later transitioned to IV cefazolin.  Patient can be transitioned to oral Keflex on discharge to complete the course.    Facial cellulitis Improving but not completely resolved as per the family at bedside the left side facial swelling and lip swelling has gone down only about 50% at this time. We will stop the lisinopril and monitor.  Recommend at least 10 days of antibiotics. Pt reports the lip swelling persistent, suspect it might be from lisinopril use, stopped the lisinopril on discharge. He denies any sob or tongue swelling or facial swelling.  He was given a dose of  prednisone.    Type 2 diabetes mellitus CBG (last 3)  Recent Labs    03/14/18 0735 03/14/18 0813 03/14/18 1204  GLUCAP 60* 90 225*   Resume home dose of insulin.  Stop the [facility] Novolog.  Acute kidney injury superimposed on stage III CKD Probably secondary to prerenal causes. Improved with hydration, creatinine is 2.4 today.  Continue to monitor.    Right third toe ulcer from moderate peripheral artery disease Patient has an appointment to see vascular surgery as outpatient in November.  Outpatient follow-up with vascular as recommended.   Discharge Instructions  Discharge Instructions    Diet - low sodium heart healthy   Complete by:  As directed    Discharge instructions   Complete by:  As directed    Follow up with PCP in one week.  Please follow up with nephrology in 1 to 2 weeks Please follow up with bmp in 1 to 2 days to check creatinine.     Allergies as of 03/14/2018      Reactions   Codeine    "Makes me feel funny."      Medication List    STOP taking these medications   ACCU-CHEK AVIVA PLUS w/Device Kit   clotrimazole-betamethasone cream Commonly known as:  LOTRISONE   Ferrous Fumarate-Folic Acid 630-1 MG Tabs   lisinopril-hydrochlorothiazide 20-12.5 MG tablet Commonly known as:  PRINZIDE,ZESTORETIC   spironolactone 25 MG tablet Commonly known as:  ALDACTONE     TAKE these medications   ACCU-CHEK SOFTCLIX LANCETS lancets Use as directed once daily to check blood sugar. DX E11.9   amLODipine 10 MG tablet Commonly known as:  NORVASC Take 1 tablet (  10 mg total) by mouth daily.   aspirin 81 MG EC tablet Take 81 mg by mouth daily. Swallow whole.   atorvastatin 80 MG tablet Commonly known as:  LIPITOR Take 1 tablet (80 mg total) by mouth daily with breakfast.   B-D SINGLE USE SWABS REGULAR Pads Use as directed daily to check blood sugar.  DX E11.9   carvedilol 25 MG tablet Commonly known as:  COREG Take 1 tablet (25 mg total) by  mouth 2 (two) times daily with a meal.   cephALEXin 500 MG capsule Commonly known as:  KEFLEX Take 1 capsule (500 mg total) by mouth 2 (two) times daily for 10 days.   fluticasone 50 MCG/ACT nasal spray Commonly known as:  FLONASE Place 2 sprays into both nostrils daily. Start taking on:  03/15/2018   gabapentin 100 MG capsule Commonly known as:  NEURONTIN TAKE ONE TO TWO CAPSULES BY MOUTH AT BEDTIME AS NEEDED   glucose blood test strip 1 each by Other route 2 (two) times daily. And lancets 2/day   insulin NPH-regular Human (70-30) 100 UNIT/ML injection Commonly known as:  NOVOLIN 70/30 36 units with breakfast, and 8 units with evening meal, and syringes 2/day What changed:  additional instructions      Contact information for after-discharge care    Destination    [facility] .   Service:  Skilled Nursing Contact information: [place] [phone]             Allergies  Allergen Reactions  . Codeine     "Makes me feel funny."    Consultations:  None.    Procedures/Studies: Dg Chest 2 View  Result Date: 03/09/2018 CLINICAL DATA:  Tooth abscess with worsening pain and swelling. EXAM: CHEST - 2 VIEW COMPARISON:  12/29/2004 FINDINGS: AP and lateral views of the chest obtained. The lungs are clear without focal pneumonia, edema, pneumothorax or pleural effusion. Tiny nodule identified over the left apex. The cardiopericardial silhouette is within normal limits for size. The visualized bony structures of the thorax are intact. Telemetry leads overlie the chest. IMPRESSION: Tiny nodular density over the left apex. CT chest without contrast recommended to further evaluate. No focal airspace consolidation or pulmonary edema. No pleural effusion. Electronically Signed   By: Misty Stanley M.D.   On: 03/09/2018 20:15   Ct Head Wo Contrast  Result Date: 03/09/2018 CLINICAL DATA:  76 y/o M;  sinusitis, acute (<4JOI), complications suspected; Altered mental status (AMS), unclear cause. EXAM: CT HEAD WITHOUT CONTRAST CT MAXILLOFACIAL WITHOUT CONTRAST TECHNIQUE: Multidetector CT imaging of the head and maxillofacial structures were performed using the standard protocol without intravenous contrast. Multiplanar CT image reconstructions of the maxillofacial structures were also generated. COMPARISON:  None. FINDINGS: CT HEAD FINDINGS Brain: No evidence of acute infarction, hemorrhage, hydrocephalus, extra-axial collection or mass lesion/mass effect. Small hypodensities within bilateral thalamus, likely chronic infarcts. Nonspecific white matter hypodensities are compatible with moderate chronic microvascular ischemic changes and there is moderate volume loss of the brain. Vascular: Calcific atherosclerosis of carotid siphons and vertebral arteries. No hyperdense vessel identified. Skull: Normal. Negative for fracture or focal lesion. Other: External auditory canal chondral ossification noted. Debris within the external auditory canals is likely cerumen. CT MAXILLOFACIAL FINDINGS Osseous: No fracture or mandibular dislocation. No destructive process. Orbits: Bilateral intra-ocular lens replacement. No acute traumatic or inflammatory finding of the orbital compartments. Sinuses: Mild maxillary sinus mucosal thickening and small mucous retention cyst. Additional paranasal sinuses  and the mastoid air cells are normally aerated. Soft tissues: Extensive swelling of the upper lip and superficial nasal soft tissues. No findings of osteomyelitis or discrete fluid collection. IMPRESSION: 1. No acute intracranial abnormality identified. 2. Moderate chronic microvascular ischemic changes and moderate volume loss of the brain. 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 3. Extensive swelling of upper lip and superficial nasal soft tissues. No findings of osteomyelitis or discrete fluid collection. No extension of  inflammation into the deep facial or orbital compartments. No associated dental lesion identified. 4. Mild paranasal sinus disease.  No findings of acute sinusitis. Electronically Signed   By: Kristine Garbe M.D.   On: 03/09/2018 22:46   Ct Chest Wo Contrast  Result Date: 03/09/2018 CLINICAL DATA:  Lung nodule, <1cm, mod-high risk EXAM: CT CHEST WITHOUT CONTRAST TECHNIQUE: Multidetector CT imaging of the chest was performed following the standard protocol without IV contrast. Performed in conjunction with CT of the head and face. COMPARISON:  Chest radiograph earlier this day. FINDINGS: Cardiovascular: Aortic atherosclerosis and tortuosity. Heart is normal in size. There are coronary artery calcifications. Small amount pericardial fluid or thickening anteriorly. Mediastinum/Nodes: Small mediastinal nodes not enlarged by size criteria. No bulky hilar adenopathy, lack of IV contrast limits assessment. Small hiatal hernia. No visualized thyroid nodule. Lungs/Pleura: No pulmonary nodule, particularly no nodule at the left lung apex to correspond to that questioned on radiograph. There are subclavian artery vascular calcifications in the region of question nodules, likely causing the appearance on radiograph. Minor dependent atelectasis in both lower lobes. The lungs are otherwise clear. No consolidation, pulmonary edema or pleural effusion. Upper Abdomen: No acute findings.  Incidental gallstones. Musculoskeletal: There are no acute or suspicious osseous abnormalities. IMPRESSION: 1. No pulmonary nodule. The area questioned on radiograph represents calcification in the subclavian artery. 2. No acute chest finding. 3. Aortic Atherosclerosis (ICD10-I70.0). Coronary artery calcifications. Electronically Signed   By: Keith Rake M.D.   On: 03/09/2018 22:55   US Renal  Result Date: 03/10/2018 CLINICAL DATA:  Acute renal failure. EXAM: RENAL / URINARY TRACT ULTRASOUND COMPLETE COMPARISON:  None.  FINDINGS: Right Kidney: Length: 12.7 cm. Echogenicity within normal limits. No mass or hydronephrosis visualized. Left Kidney: Length: 12.9 cm. Echogenicity within normal limits. No mass or hydronephrosis visualized. Bladder: Appears normal for degree of bladder distention. IMPRESSION: No cause for the patient's symptoms identified.  Normal study. Electronically Signed   By: Dorise Bullion III M.D   On: 03/10/2018 01:47   Dg Foot Complete Right  Result Date: 03/09/2018 CLINICAL DATA:  Evaluate for infection. EXAM: RIGHT FOOT COMPLETE - 3+ VIEW COMPARISON:  None. FINDINGS: Vascular calcifications are noted. No fractures. No bony erosion to suggest osteomyelitis. IMPRESSION: Vascular calcifications.  No evidence of osteomyelitis. Electronically Signed   By: Dorise Bullion III M.D   On: 03/09/2018 21:10   Ct Maxillofacial Wo Contrast  Result Date: 03/09/2018 CLINICAL DATA:  77 y/o M; sinusitis, acute (<7SVX), complications suspected; Altered mental status (AMS), unclear cause. EXAM: CT HEAD WITHOUT CONTRAST CT MAXILLOFACIAL WITHOUT CONTRAST TECHNIQUE: Multidetector CT imaging of the head and maxillofacial structures were performed using the standard protocol without intravenous contrast. Multiplanar CT image reconstructions of the maxillofacial structures were also generated. COMPARISON:  None. FINDINGS: CT HEAD FINDINGS Brain: No evidence of acute infarction, hemorrhage, hydrocephalus, extra-axial collection or mass lesion/mass effect. Small hypodensities within bilateral thalamus, likely chronic infarcts. Nonspecific white matter hypodensities are compatible with moderate chronic microvascular ischemic changes and there is moderate volume loss  of the brain. Vascular: Calcific atherosclerosis of carotid siphons and vertebral arteries. No hyperdense vessel identified. Skull: Normal. Negative for fracture or focal lesion. Other: External auditory canal chondral ossification noted. Debris within the external  auditory canals is likely cerumen. CT MAXILLOFACIAL FINDINGS Osseous: No fracture or mandibular dislocation. No destructive process. Orbits: Bilateral intra-ocular lens replacement. No acute traumatic or inflammatory finding of the orbital compartments. Sinuses: Mild maxillary sinus mucosal thickening and small mucous retention cyst. Additional paranasal sinuses and the mastoid air cells are normally aerated. Soft tissues: Extensive swelling of the upper lip and superficial nasal soft tissues. No findings of osteomyelitis or discrete fluid collection. IMPRESSION: 1. No acute intracranial abnormality identified. 2. Moderate chronic microvascular ischemic changes and moderate volume loss of the brain. 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 3. Extensive swelling of upper lip and superficial nasal soft tissues. No findings of osteomyelitis or discrete fluid collection. No extension of inflammation into the deep facial or orbital compartments. No associated dental lesion identified. 4. Mild paranasal sinus disease.  No findings of acute sinusitis. Electronically Signed   By: Kristine Garbe M.D.   On: 03/09/2018 22:46    Subjective: No chest pain or any complaints.   Discharge Exam: Vitals:   03/13/18 2048 03/14/18 0619  BP: 90/72 139/78  Pulse: 66 73  Resp: 14   Temp:  97.7 F (36.5 C)  SpO2: 100% 99%   Vitals:   03/12/18 2230 03/13/18 1500 03/13/18 2048 03/14/18 0619  BP: 130/72 130/71 90/72 139/78  Pulse: 79 69 66 73  Resp: 15 15 14    Temp: 97.8 F (36.6 C) 97.7 F (36.5 C)  97.7 F (36.5 C)  TempSrc: Oral Oral  Oral  SpO2: 99% 99% 100% 99%  Weight:      Height:        General: Pt is alert, awake, not in acute distress, facial swelling is improving.  Cardiovascular: RRR, S1/S2 +, no rubs, no gallops Respiratory: CTA bilaterally, no wheezing, no rhonchi Abdominal: Soft, NT, ND, bowel sounds + Extremities: no edema, no cyanosis    The results of significant  diagnostics from this hospitalization (including imaging, microbiology, ancillary and laboratory) are listed below for reference.     Microbiology: Recent Results (from the past 240 hour(s))  Culture, Urine     Status: Abnormal   Collection Time: 03/09/18 10:47 PM  Result Value Ref Range Status   Specimen Description   Final    URINE, CLEAN CATCH Performed at [facility], [place] [address], [place], [place] [address]    Special Requests   Final    NONE Performed at [facility], [place] [address] [place], [place] [address]    Culture 80,000 COLONIES/mL PROTEUS MIRABILIS (A)  Final   Report Status 03/12/2018 FINAL  Final   Organism ID, Bacteria PROTEUS MIRABILIS (A)  Final      Susceptibility   Proteus mirabilis - MIC*    AMPICILLIN <=2 SENSITIVE Sensitive     CEFAZOLIN <=4 SENSITIVE Sensitive     CEFTRIAXONE <=1 SENSITIVE Sensitive     CIPROFLOXACIN <=0.25 SENSITIVE Sensitive     GENTAMICIN <=1 SENSITIVE Sensitive     IMIPENEM 2 SENSITIVE Sensitive     NITROFURANTOIN RESISTANT Resistant     TRIMETH/SULFA <=20 SENSITIVE Sensitive     AMPICILLIN/SULBACTAM <=2 SENSITIVE Sensitive     PIP/TAZO <=4 SENSITIVE Sensitive     * 80,000 COLONIES/mL PROTEUS MIRABILIS  Culture, blood (routine x 2)  Status: None (Preliminary result)   Collection Time: 03/10/18  2:21 AM  Result Value Ref Range Status   Specimen Description   Final    BLOOD RIGHT ANTECUBITAL Performed at [facility] [address], [place], [place] [address]    Special Requests   Final    BOTTLES DRAWN AEROBIC ONLY Blood Culture adequate volume Performed at [facility] [address] [place], [place] [address]    Culture   Final    NO GROWTH 4 DAYS Performed at [facility], [place] [address] [place], [place] [address]    Report Status PENDING  Incomplete  Culture, blood (routine x 2)     Status: None (Preliminary result)   Collection  Time: 03/10/18  2:21 AM  Result Value Ref Range Status   Specimen Description   Final    BLOOD RIGHT FOREARM Performed at [facility] [address], [place], [place] [address]    Special Requests   Final    BOTTLES DRAWN AEROBIC ONLY Blood Culture adequate volume Performed at [facility] [address] [place], [place] [address]    Culture   Final    NO GROWTH 4 DAYS Performed at [facility], [place] [address], [place],  [address]    Report Status PENDING  Incomplete     Labs: BNP (last 3 results) No results for input(s): BNP in the last 8760 hours. Basic Metabolic Panel: Recent Labs  Lab 03/10/18 0221 03/11/18 0423 03/12/18 0506 03/13/18 0502 03/14/18 0528  NA 138 137 137 140 141  K 4.1 4.3 4.2 4.7 4.1  CL 111 109 110 109 112*  CO2 18* 17* 18* 20* 19*  GLUCOSE 194* 196* 213* 223* 61*  BUN 67* 59* 60* 59* 53*  CREATININE 3.64* 2.79* 2.50* 2.41* 2.13*  CALCIUM 8.5* 8.3* 8.2* 8.5* 8.4*  MG  --  2.1 2.2 2.2 2.2   Liver Function Tests: Recent Labs  Lab 03/09/18 1952  AST 52*  ALT 26  ALKPHOS 76  BILITOT 0.7  PROT 8.5*  ALBUMIN 3.0*   No results for input(s): LIPASE, AMYLASE in the last 168 hours. No results for input(s): AMMONIA in the last 168 hours. CBC: Recent Labs  Lab 03/10/18 0221 03/11/18 0423 03/12/18 0506 03/13/18 0502 03/14/18 0528  WBC 16.0* 17.5* 16.8* 13.0* 12.3*  NEUTROABS 13.0* 14.6* 14.2* 10.6* 9.7*  HGB 10.8* 11.3* 10.5* 10.3* 10.9*  HCT 32.6* 33.7* 31.3* 32.4* 34.6*  MCV 83.2 82.6 82.4 84.4 86.1  PLT 310 315 310 315 339   Cardiac Enzymes: No results for input(s): CKTOTAL, CKMB, CKMBINDEX, TROPONINI in the last 168 hours. BNP: Invalid input(s): POCBNP CBG: Recent Labs  Lab 03/13/18 1658 03/13/18 2049 03/14/18 0735 03/14/18 0813 03/14/18 1204  GLUCAP 208* 183* 60* 90 225*   D-Dimer No results for input(s): DDIMER in the last 72 hours. Hgb A1c No results for input(s):  HGBA1C in the last 72 hours. Lipid Profile No results for input(s): CHOL, HDL, LDLCALC, TRIG, CHOLHDL, LDLDIRECT in the last 72 hours. Thyroid function studies No results for input(s): TSH, T4TOTAL, T3FREE, THYROIDAB in the last 72 hours.  Invalid input(s): FREET3 Anemia work up No results for input(s): VITAMINB12, FOLATE, FERRITIN, TIBC, IRON, RETICCTPCT in the last 72 hours. Urinalysis    Component Value Date/Time   COLORURINE YELLOW 03/09/2018 2247   APPEARANCEUR CLOUDY (A) 03/09/2018 2247   LABSPEC 1.009 03/09/2018 2247   PHURINE 5.0 03/09/2018 2247  GLUCOSEU >=500 (A) 03/09/2018 2247   GLUCOSEU 250 (A) 08/10/2016 1145   HGBUR LARGE (A) 03/09/2018 2247   BILIRUBINUR NEGATIVE 03/09/2018 [place] 03/09/2018 2247   PROTEINUR 100 (A) 03/09/2018 2247   UROBILINOGEN 0.2 08/10/2016 1145   NITRITE NEGATIVE 03/09/2018 2247   LEUKOCYTESUR LARGE (A) 03/09/2018 2247   Sepsis Labs Invalid input(s): PROCALCITONIN,  WBC,  LACTICIDVEN Microbiology Recent Results (from the past 240 hour(s))  Culture, Urine     Status: Abnormal   Collection Time: 03/09/18 10:47 PM  Result Value Ref Range Status   Specimen Description   Final    URINE, CLEAN CATCH Performed at [facility], [place] [address], [place], [place] [address]    Special Requests   Final    NONE Performed at [facility], [place] [address], [place], [place] [address]    Culture 80,000 COLONIES/mL PROTEUS MIRABILIS (A)  Final   Report Status 03/12/2018 FINAL  Final   Organism ID, Bacteria PROTEUS MIRABILIS (A)  Final      Susceptibility   Proteus mirabilis - MIC*    AMPICILLIN <=2 SENSITIVE Sensitive     CEFAZOLIN <=4 SENSITIVE Sensitive     CEFTRIAXONE <=1 SENSITIVE Sensitive     CIPROFLOXACIN <=0.25 SENSITIVE Sensitive     GENTAMICIN <=1 SENSITIVE Sensitive     IMIPENEM 2 SENSITIVE Sensitive     NITROFURANTOIN RESISTANT Resistant     TRIMETH/SULFA <=20 SENSITIVE  Sensitive     AMPICILLIN/SULBACTAM <=2 SENSITIVE Sensitive     PIP/TAZO <=4 SENSITIVE Sensitive     * 80,000 COLONIES/mL PROTEUS MIRABILIS  Culture, blood (routine x 2)     Status: None (Preliminary result)   Collection Time: 03/10/18  2:21 AM  Result Value Ref Range Status   Specimen Description   Final    BLOOD RIGHT ANTECUBITAL Performed at [facility] [address] [place], [place] [address]    Special Requests   Final    BOTTLES DRAWN AEROBIC ONLY Blood Culture adequate volume Performed at [facility] [address] [place], [place] [address]    Culture   Final    NO GROWTH 4 DAYS Performed at [facility], [place] [address], [place], [place] [address]    Report Status PENDING  Incomplete  Culture, blood (routine x 2)     Status: None (Preliminary result)   Collection Time: 03/10/18  2:21 AM  Result Value Ref Range Status   Specimen Description   Final    BLOOD RIGHT FOREARM Performed at [facility] [address], [place], [place] [address]    Special Requests   Final    BOTTLES DRAWN AEROBIC ONLY Blood Culture adequate volume Performed at [facility] [address], [place], [place] [address]    Culture   Final    NO GROWTH 4 DAYS Performed at [facility], [place] [address], [place],  [address]    Report Status PENDING  Incomplete     Time coordinating discharge: 32 minutes  SIGNED:   Hosie Poisson, MD  [facility] 03/14/2018, 1:09 PM Pager   If 7PM-7AM, please contact night-coverage [email or web address] Password [email or web address]

## 2018-03-15 DIAGNOSIS — I739 Peripheral vascular disease, unspecified: Secondary | ICD-10-CM | POA: Diagnosis not present

## 2018-03-15 DIAGNOSIS — N390 Urinary tract infection, site not specified: Secondary | ICD-10-CM | POA: Diagnosis not present

## 2018-03-15 DIAGNOSIS — L03211 Cellulitis of face: Secondary | ICD-10-CM | POA: Diagnosis not present

## 2018-03-15 DIAGNOSIS — E1122 Type 2 diabetes mellitus with diabetic chronic kidney disease: Secondary | ICD-10-CM | POA: Diagnosis not present

## 2018-03-15 LAB — CULTURE, BLOOD (ROUTINE X 2)
CULTURE: NO GROWTH
Culture: NO GROWTH
SPECIAL REQUESTS: ADEQUATE
Special Requests: ADEQUATE

## 2018-03-20 DIAGNOSIS — N390 Urinary tract infection, site not specified: Secondary | ICD-10-CM | POA: Diagnosis not present

## 2018-03-20 DIAGNOSIS — I96 Gangrene, not elsewhere classified: Secondary | ICD-10-CM | POA: Diagnosis not present

## 2018-03-20 DIAGNOSIS — E119 Type 2 diabetes mellitus without complications: Secondary | ICD-10-CM | POA: Diagnosis not present

## 2018-03-20 DIAGNOSIS — I739 Peripheral vascular disease, unspecified: Secondary | ICD-10-CM | POA: Diagnosis not present

## 2018-03-20 DIAGNOSIS — I10 Essential (primary) hypertension: Secondary | ICD-10-CM | POA: Diagnosis not present

## 2018-03-20 DIAGNOSIS — N183 Chronic kidney disease, stage 3 (moderate): Secondary | ICD-10-CM | POA: Diagnosis not present

## 2018-03-20 DIAGNOSIS — L0390 Cellulitis, unspecified: Secondary | ICD-10-CM | POA: Diagnosis not present

## 2018-03-22 DIAGNOSIS — I70235 Atherosclerosis of native arteries of right leg with ulceration of other part of foot: Secondary | ICD-10-CM | POA: Diagnosis not present

## 2018-03-22 DIAGNOSIS — L03211 Cellulitis of face: Secondary | ICD-10-CM | POA: Diagnosis not present

## 2018-03-22 DIAGNOSIS — N390 Urinary tract infection, site not specified: Secondary | ICD-10-CM | POA: Diagnosis not present

## 2018-03-22 DIAGNOSIS — E1122 Type 2 diabetes mellitus with diabetic chronic kidney disease: Secondary | ICD-10-CM | POA: Diagnosis not present

## 2018-03-30 DIAGNOSIS — E11621 Type 2 diabetes mellitus with foot ulcer: Secondary | ICD-10-CM | POA: Diagnosis not present

## 2018-03-30 DIAGNOSIS — E1140 Type 2 diabetes mellitus with diabetic neuropathy, unspecified: Secondary | ICD-10-CM | POA: Diagnosis not present

## 2018-03-30 DIAGNOSIS — L97513 Non-pressure chronic ulcer of other part of right foot with necrosis of muscle: Secondary | ICD-10-CM | POA: Diagnosis not present

## 2018-03-30 DIAGNOSIS — E782 Mixed hyperlipidemia: Secondary | ICD-10-CM | POA: Diagnosis not present

## 2018-03-30 DIAGNOSIS — R2681 Unsteadiness on feet: Secondary | ICD-10-CM | POA: Diagnosis not present

## 2018-03-30 DIAGNOSIS — N183 Chronic kidney disease, stage 3 (moderate): Secondary | ICD-10-CM | POA: Diagnosis not present

## 2018-03-30 DIAGNOSIS — I129 Hypertensive chronic kidney disease with stage 1 through stage 4 chronic kidney disease, or unspecified chronic kidney disease: Secondary | ICD-10-CM | POA: Diagnosis not present

## 2018-03-30 DIAGNOSIS — Z794 Long term (current) use of insulin: Secondary | ICD-10-CM | POA: Diagnosis not present

## 2018-03-30 DIAGNOSIS — N390 Urinary tract infection, site not specified: Secondary | ICD-10-CM | POA: Diagnosis not present

## 2018-04-01 ENCOUNTER — Telehealth: Payer: Self-pay | Admitting: Family Medicine

## 2018-04-01 DIAGNOSIS — Z794 Long term (current) use of insulin: Secondary | ICD-10-CM | POA: Diagnosis not present

## 2018-04-01 DIAGNOSIS — I129 Hypertensive chronic kidney disease with stage 1 through stage 4 chronic kidney disease, or unspecified chronic kidney disease: Secondary | ICD-10-CM | POA: Diagnosis not present

## 2018-04-01 DIAGNOSIS — E1140 Type 2 diabetes mellitus with diabetic neuropathy, unspecified: Secondary | ICD-10-CM | POA: Diagnosis not present

## 2018-04-01 DIAGNOSIS — L97513 Non-pressure chronic ulcer of other part of right foot with necrosis of muscle: Secondary | ICD-10-CM | POA: Diagnosis not present

## 2018-04-01 DIAGNOSIS — E782 Mixed hyperlipidemia: Secondary | ICD-10-CM | POA: Diagnosis not present

## 2018-04-01 DIAGNOSIS — N183 Chronic kidney disease, stage 3 (moderate): Secondary | ICD-10-CM | POA: Diagnosis not present

## 2018-04-01 DIAGNOSIS — E11621 Type 2 diabetes mellitus with foot ulcer: Secondary | ICD-10-CM | POA: Diagnosis not present

## 2018-04-01 DIAGNOSIS — R2681 Unsteadiness on feet: Secondary | ICD-10-CM | POA: Diagnosis not present

## 2018-04-01 DIAGNOSIS — N390 Urinary tract infection, site not specified: Secondary | ICD-10-CM | POA: Diagnosis not present

## 2018-04-01 NOTE — Telephone Encounter (Signed)
Copied from [place] [id number]. Topic: Quick Communication - Home Health Verbal Orders >> Apr 01, 2018  2:20 PM Lennox Solders wrote: Caller/Agency: [facility] Number: [phone] Requesting PT orders for twice a wk for 3 wks and then once a wk for 1 wk

## 2018-04-02 ENCOUNTER — Encounter: Payer: Medicare HMO | Admitting: Vascular Surgery

## 2018-04-02 DIAGNOSIS — E11621 Type 2 diabetes mellitus with foot ulcer: Secondary | ICD-10-CM | POA: Diagnosis not present

## 2018-04-02 DIAGNOSIS — Z794 Long term (current) use of insulin: Secondary | ICD-10-CM | POA: Diagnosis not present

## 2018-04-02 DIAGNOSIS — E782 Mixed hyperlipidemia: Secondary | ICD-10-CM | POA: Diagnosis not present

## 2018-04-02 DIAGNOSIS — R2681 Unsteadiness on feet: Secondary | ICD-10-CM | POA: Diagnosis not present

## 2018-04-02 DIAGNOSIS — N390 Urinary tract infection, site not specified: Secondary | ICD-10-CM | POA: Diagnosis not present

## 2018-04-02 DIAGNOSIS — L97513 Non-pressure chronic ulcer of other part of right foot with necrosis of muscle: Secondary | ICD-10-CM | POA: Diagnosis not present

## 2018-04-02 DIAGNOSIS — N183 Chronic kidney disease, stage 3 (moderate): Secondary | ICD-10-CM | POA: Diagnosis not present

## 2018-04-02 DIAGNOSIS — I129 Hypertensive chronic kidney disease with stage 1 through stage 4 chronic kidney disease, or unspecified chronic kidney disease: Secondary | ICD-10-CM | POA: Diagnosis not present

## 2018-04-02 DIAGNOSIS — E1140 Type 2 diabetes mellitus with diabetic neuropathy, unspecified: Secondary | ICD-10-CM | POA: Diagnosis not present

## 2018-04-03 ENCOUNTER — Encounter: Payer: Self-pay | Admitting: Vascular Surgery

## 2018-04-03 DIAGNOSIS — N390 Urinary tract infection, site not specified: Secondary | ICD-10-CM | POA: Diagnosis not present

## 2018-04-03 DIAGNOSIS — E1140 Type 2 diabetes mellitus with diabetic neuropathy, unspecified: Secondary | ICD-10-CM | POA: Diagnosis not present

## 2018-04-03 DIAGNOSIS — Z794 Long term (current) use of insulin: Secondary | ICD-10-CM | POA: Diagnosis not present

## 2018-04-03 DIAGNOSIS — I129 Hypertensive chronic kidney disease with stage 1 through stage 4 chronic kidney disease, or unspecified chronic kidney disease: Secondary | ICD-10-CM | POA: Diagnosis not present

## 2018-04-03 DIAGNOSIS — E11621 Type 2 diabetes mellitus with foot ulcer: Secondary | ICD-10-CM | POA: Diagnosis not present

## 2018-04-03 DIAGNOSIS — N183 Chronic kidney disease, stage 3 (moderate): Secondary | ICD-10-CM | POA: Diagnosis not present

## 2018-04-03 DIAGNOSIS — L97513 Non-pressure chronic ulcer of other part of right foot with necrosis of muscle: Secondary | ICD-10-CM | POA: Diagnosis not present

## 2018-04-03 DIAGNOSIS — R2681 Unsteadiness on feet: Secondary | ICD-10-CM | POA: Diagnosis not present

## 2018-04-03 DIAGNOSIS — E782 Mixed hyperlipidemia: Secondary | ICD-10-CM | POA: Diagnosis not present

## 2018-04-04 ENCOUNTER — Other Ambulatory Visit: Payer: Medicare HMO

## 2018-04-04 ENCOUNTER — Ambulatory Visit (INDEPENDENT_AMBULATORY_CARE_PROVIDER_SITE_OTHER): Payer: Medicare HMO | Admitting: Family Medicine

## 2018-04-04 ENCOUNTER — Encounter: Payer: Self-pay | Admitting: Family Medicine

## 2018-04-04 VITALS — BP 122/58 | HR 89 | Temp 98.0°F | Resp 18 | Ht 69.0 in | Wt 170.0 lb

## 2018-04-04 DIAGNOSIS — Z794 Long term (current) use of insulin: Secondary | ICD-10-CM

## 2018-04-04 DIAGNOSIS — N183 Chronic kidney disease, stage 3 unspecified: Secondary | ICD-10-CM

## 2018-04-04 DIAGNOSIS — Z23 Encounter for immunization: Secondary | ICD-10-CM | POA: Diagnosis not present

## 2018-04-04 DIAGNOSIS — E559 Vitamin D deficiency, unspecified: Secondary | ICD-10-CM | POA: Diagnosis not present

## 2018-04-04 DIAGNOSIS — E1122 Type 2 diabetes mellitus with diabetic chronic kidney disease: Secondary | ICD-10-CM | POA: Diagnosis not present

## 2018-04-04 DIAGNOSIS — I10 Essential (primary) hypertension: Secondary | ICD-10-CM | POA: Diagnosis not present

## 2018-04-04 DIAGNOSIS — E782 Mixed hyperlipidemia: Secondary | ICD-10-CM

## 2018-04-04 DIAGNOSIS — D72829 Elevated white blood cell count, unspecified: Secondary | ICD-10-CM

## 2018-04-04 DIAGNOSIS — J309 Allergic rhinitis, unspecified: Secondary | ICD-10-CM

## 2018-04-04 DIAGNOSIS — N179 Acute kidney failure, unspecified: Secondary | ICD-10-CM | POA: Diagnosis not present

## 2018-04-04 DIAGNOSIS — N289 Disorder of kidney and ureter, unspecified: Secondary | ICD-10-CM | POA: Diagnosis not present

## 2018-04-04 LAB — CBC WITH DIFFERENTIAL/PLATELET
BASOS PCT: 0.4 %
Basophils Absolute: 18 cells/uL (ref 0–200)
Eosinophils Absolute: 244 cells/uL (ref 15–500)
Eosinophils Relative: 5.3 %
HCT: 30.2 % — ABNORMAL LOW (ref 38.5–50.0)
HEMOGLOBIN: 10 g/dL — AB (ref 13.2–17.1)
LYMPHS ABS: 980 {cells}/uL (ref 850–3900)
MCH: 27.1 pg (ref 27.0–33.0)
MCHC: 33.1 g/dL (ref 32.0–36.0)
MCV: 81.8 fL (ref 80.0–100.0)
MPV: 11.5 fL (ref 7.5–12.5)
Monocytes Relative: 8.8 %
Neutro Abs: 2953 cells/uL (ref 1500–7800)
Neutrophils Relative %: 64.2 %
Platelets: 209 10*3/uL (ref 140–400)
RBC: 3.69 10*6/uL — AB (ref 4.20–5.80)
RDW: 13.1 % (ref 11.0–15.0)
TOTAL LYMPHOCYTE: 21.3 %
WBC: 4.6 10*3/uL (ref 3.8–10.8)
WBCMIX: 405 {cells}/uL (ref 200–950)

## 2018-04-04 LAB — COMPREHENSIVE METABOLIC PANEL
ALT: 15 U/L (ref 0–53)
AST: 16 U/L (ref 0–37)
Albumin: 3.1 g/dL — ABNORMAL LOW (ref 3.5–5.2)
Alkaline Phosphatase: 75 U/L (ref 39–117)
BUN: 25 mg/dL — ABNORMAL HIGH (ref 6–23)
CALCIUM: 8.6 mg/dL (ref 8.4–10.5)
CO2: 24 meq/L (ref 19–32)
Chloride: 108 mEq/L (ref 96–112)
Creatinine, Ser: 1.61 mg/dL — ABNORMAL HIGH (ref 0.40–1.50)
GFR: 54.07 mL/min — AB (ref >60.00)
GLUCOSE: 302 mg/dL — AB (ref 70–99)
POTASSIUM: 4 meq/L (ref 3.5–5.1)
Sodium: 139 mEq/L (ref 135–145)
Total Bilirubin: 0.5 mg/dL (ref 0.2–1.2)
Total Protein: 7.4 g/dL (ref 6.0–8.3)

## 2018-04-04 LAB — VITAMIN D 25 HYDROXY (VIT D DEFICIENCY, FRACTURES): VITD: 10.21 ng/mL — ABNORMAL LOW (ref 30.00–100.00)

## 2018-04-04 MED ORDER — LORATADINE 10 MG PO TABS: 10.0000 mg | ORAL_TABLET | Freq: Every day | ORAL | 11 refills | Status: DC

## 2018-04-04 MED ORDER — FLUTICASONE PROPIONATE 50 MCG/ACT NA SUSP: 2.0000 | Freq: Every day | NASAL | 1 refills | Status: DC

## 2018-04-04 MED FILL — LORATADINE 10 MG TABLET: 10 | 100 days supply | Qty: 100 | Fill #0

## 2018-04-04 MED FILL — FLUTICASONE PROP 50 MCG SPR: 50 | 30 days supply | Qty: 16 | Fill #0

## 2018-04-04 NOTE — Patient Instructions (Signed)

## 2018-04-05 ENCOUNTER — Encounter: Payer: Medicare HMO | Admitting: Vascular Surgery

## 2018-04-05 DIAGNOSIS — Z794 Long term (current) use of insulin: Secondary | ICD-10-CM | POA: Diagnosis not present

## 2018-04-05 DIAGNOSIS — R2681 Unsteadiness on feet: Secondary | ICD-10-CM | POA: Diagnosis not present

## 2018-04-05 DIAGNOSIS — I129 Hypertensive chronic kidney disease with stage 1 through stage 4 chronic kidney disease, or unspecified chronic kidney disease: Secondary | ICD-10-CM | POA: Diagnosis not present

## 2018-04-05 DIAGNOSIS — N183 Chronic kidney disease, stage 3 (moderate): Secondary | ICD-10-CM | POA: Diagnosis not present

## 2018-04-05 DIAGNOSIS — N390 Urinary tract infection, site not specified: Secondary | ICD-10-CM | POA: Diagnosis not present

## 2018-04-05 DIAGNOSIS — E1140 Type 2 diabetes mellitus with diabetic neuropathy, unspecified: Secondary | ICD-10-CM | POA: Diagnosis not present

## 2018-04-05 DIAGNOSIS — E11621 Type 2 diabetes mellitus with foot ulcer: Secondary | ICD-10-CM | POA: Diagnosis not present

## 2018-04-05 DIAGNOSIS — E782 Mixed hyperlipidemia: Secondary | ICD-10-CM | POA: Diagnosis not present

## 2018-04-05 DIAGNOSIS — L97513 Non-pressure chronic ulcer of other part of right foot with necrosis of muscle: Secondary | ICD-10-CM | POA: Diagnosis not present

## 2018-04-07 DIAGNOSIS — D72829 Elevated white blood cell count, unspecified: Secondary | ICD-10-CM | POA: Insufficient documentation

## 2018-04-07 DIAGNOSIS — E559 Vitamin D deficiency, unspecified: Secondary | ICD-10-CM | POA: Insufficient documentation

## 2018-04-07 NOTE — Assessment & Plan Note (Signed)
Well controlled, no changes to meds. Encouraged heart healthy diet such as the DASH diet and exercise as tolerated.  °

## 2018-04-07 NOTE — Assessment & Plan Note (Signed)
Labs reveal deficiency. Start on Vitamin D 50000 IU caps, 1 cap po weekly x 12 weeks. Disp #4 with 4 rf. Also take daily Vitamin D over the counter. If already taking a daily supplement increase by 1000 IU daily and if not start Vitamin D 2000 IU daily.  
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
## 2018-04-07 NOTE — Assessment & Plan Note (Signed)
Resolved

## 2018-04-07 NOTE — Assessment & Plan Note (Signed)
Loratadine 10 mg daily and Flonase

## 2018-04-07 NOTE — Assessment & Plan Note (Signed)
Encouraged heart healthy diet, increase exercise, avoid trans fats, consider a krill oil cap daily 

## 2018-04-07 NOTE — Assessment & Plan Note (Signed)
He has recently hospitalized with UTI, fever and AKI and then spent some time in Rehab. He is now home and doing well. Check labs and report any acute concerns.

## 2018-04-07 NOTE — Assessment & Plan Note (Signed)
Supplement and monitor 

## 2018-04-07 NOTE — Assessment & Plan Note (Signed)
hgba1c acceptable, minimize simple carbs. Increase exercise as tolerated.  

## 2018-04-07 NOTE — Assessment & Plan Note (Signed)
Recent AKI, encouraged increased hydration and monitor

## 2018-04-08 DIAGNOSIS — R2681 Unsteadiness on feet: Secondary | ICD-10-CM | POA: Diagnosis not present

## 2018-04-08 DIAGNOSIS — E782 Mixed hyperlipidemia: Secondary | ICD-10-CM | POA: Diagnosis not present

## 2018-04-08 DIAGNOSIS — I129 Hypertensive chronic kidney disease with stage 1 through stage 4 chronic kidney disease, or unspecified chronic kidney disease: Secondary | ICD-10-CM | POA: Diagnosis not present

## 2018-04-08 DIAGNOSIS — N390 Urinary tract infection, site not specified: Secondary | ICD-10-CM | POA: Diagnosis not present

## 2018-04-08 DIAGNOSIS — Z794 Long term (current) use of insulin: Secondary | ICD-10-CM | POA: Diagnosis not present

## 2018-04-08 DIAGNOSIS — E1140 Type 2 diabetes mellitus with diabetic neuropathy, unspecified: Secondary | ICD-10-CM | POA: Diagnosis not present

## 2018-04-08 DIAGNOSIS — N183 Chronic kidney disease, stage 3 (moderate): Secondary | ICD-10-CM | POA: Diagnosis not present

## 2018-04-08 DIAGNOSIS — L97513 Non-pressure chronic ulcer of other part of right foot with necrosis of muscle: Secondary | ICD-10-CM | POA: Diagnosis not present

## 2018-04-08 DIAGNOSIS — E11621 Type 2 diabetes mellitus with foot ulcer: Secondary | ICD-10-CM | POA: Diagnosis not present

## 2018-04-09 NOTE — Telephone Encounter (Signed)
Spoke w/ Constance Haw- verbal orders given.

## 2018-04-10 DIAGNOSIS — Z794 Long term (current) use of insulin: Secondary | ICD-10-CM | POA: Diagnosis not present

## 2018-04-10 DIAGNOSIS — E11621 Type 2 diabetes mellitus with foot ulcer: Secondary | ICD-10-CM | POA: Diagnosis not present

## 2018-04-10 DIAGNOSIS — R2681 Unsteadiness on feet: Secondary | ICD-10-CM | POA: Diagnosis not present

## 2018-04-10 DIAGNOSIS — L97513 Non-pressure chronic ulcer of other part of right foot with necrosis of muscle: Secondary | ICD-10-CM | POA: Diagnosis not present

## 2018-04-10 DIAGNOSIS — I129 Hypertensive chronic kidney disease with stage 1 through stage 4 chronic kidney disease, or unspecified chronic kidney disease: Secondary | ICD-10-CM | POA: Diagnosis not present

## 2018-04-10 DIAGNOSIS — E782 Mixed hyperlipidemia: Secondary | ICD-10-CM | POA: Diagnosis not present

## 2018-04-10 DIAGNOSIS — N183 Chronic kidney disease, stage 3 (moderate): Secondary | ICD-10-CM | POA: Diagnosis not present

## 2018-04-10 DIAGNOSIS — E1140 Type 2 diabetes mellitus with diabetic neuropathy, unspecified: Secondary | ICD-10-CM | POA: Diagnosis not present

## 2018-04-10 DIAGNOSIS — N390 Urinary tract infection, site not specified: Secondary | ICD-10-CM | POA: Diagnosis not present

## 2018-04-11 DIAGNOSIS — L97513 Non-pressure chronic ulcer of other part of right foot with necrosis of muscle: Secondary | ICD-10-CM | POA: Diagnosis not present

## 2018-04-11 DIAGNOSIS — N183 Chronic kidney disease, stage 3 (moderate): Secondary | ICD-10-CM | POA: Diagnosis not present

## 2018-04-11 DIAGNOSIS — N390 Urinary tract infection, site not specified: Secondary | ICD-10-CM | POA: Diagnosis not present

## 2018-04-11 DIAGNOSIS — E1140 Type 2 diabetes mellitus with diabetic neuropathy, unspecified: Secondary | ICD-10-CM | POA: Diagnosis not present

## 2018-04-11 DIAGNOSIS — Z794 Long term (current) use of insulin: Secondary | ICD-10-CM | POA: Diagnosis not present

## 2018-04-11 DIAGNOSIS — R2681 Unsteadiness on feet: Secondary | ICD-10-CM | POA: Diagnosis not present

## 2018-04-11 DIAGNOSIS — I129 Hypertensive chronic kidney disease with stage 1 through stage 4 chronic kidney disease, or unspecified chronic kidney disease: Secondary | ICD-10-CM | POA: Diagnosis not present

## 2018-04-11 DIAGNOSIS — E782 Mixed hyperlipidemia: Secondary | ICD-10-CM | POA: Diagnosis not present

## 2018-04-11 DIAGNOSIS — E11621 Type 2 diabetes mellitus with foot ulcer: Secondary | ICD-10-CM | POA: Diagnosis not present

## 2018-04-15 ENCOUNTER — Ambulatory Visit: Payer: Medicare HMO | Admitting: Endocrinology

## 2018-04-15 DIAGNOSIS — Z794 Long term (current) use of insulin: Secondary | ICD-10-CM | POA: Diagnosis not present

## 2018-04-15 DIAGNOSIS — E1140 Type 2 diabetes mellitus with diabetic neuropathy, unspecified: Secondary | ICD-10-CM | POA: Diagnosis not present

## 2018-04-15 DIAGNOSIS — Z0289 Encounter for other administrative examinations: Secondary | ICD-10-CM

## 2018-04-15 DIAGNOSIS — I129 Hypertensive chronic kidney disease with stage 1 through stage 4 chronic kidney disease, or unspecified chronic kidney disease: Secondary | ICD-10-CM | POA: Diagnosis not present

## 2018-04-15 DIAGNOSIS — L97513 Non-pressure chronic ulcer of other part of right foot with necrosis of muscle: Secondary | ICD-10-CM | POA: Diagnosis not present

## 2018-04-15 DIAGNOSIS — E782 Mixed hyperlipidemia: Secondary | ICD-10-CM | POA: Diagnosis not present

## 2018-04-15 DIAGNOSIS — E11621 Type 2 diabetes mellitus with foot ulcer: Secondary | ICD-10-CM | POA: Diagnosis not present

## 2018-04-15 DIAGNOSIS — N390 Urinary tract infection, site not specified: Secondary | ICD-10-CM | POA: Diagnosis not present

## 2018-04-15 DIAGNOSIS — R2681 Unsteadiness on feet: Secondary | ICD-10-CM | POA: Diagnosis not present

## 2018-04-15 DIAGNOSIS — N183 Chronic kidney disease, stage 3 (moderate): Secondary | ICD-10-CM | POA: Diagnosis not present

## 2018-04-16 ENCOUNTER — Telehealth: Payer: Self-pay | Admitting: Family Medicine

## 2018-04-16 ENCOUNTER — Other Ambulatory Visit: Payer: Self-pay

## 2018-04-16 ENCOUNTER — Telehealth: Payer: Self-pay

## 2018-04-16 DIAGNOSIS — E1140 Type 2 diabetes mellitus with diabetic neuropathy, unspecified: Secondary | ICD-10-CM | POA: Diagnosis not present

## 2018-04-16 DIAGNOSIS — R2681 Unsteadiness on feet: Secondary | ICD-10-CM | POA: Diagnosis not present

## 2018-04-16 DIAGNOSIS — E782 Mixed hyperlipidemia: Secondary | ICD-10-CM | POA: Diagnosis not present

## 2018-04-16 DIAGNOSIS — N390 Urinary tract infection, site not specified: Secondary | ICD-10-CM | POA: Diagnosis not present

## 2018-04-16 DIAGNOSIS — I129 Hypertensive chronic kidney disease with stage 1 through stage 4 chronic kidney disease, or unspecified chronic kidney disease: Secondary | ICD-10-CM | POA: Diagnosis not present

## 2018-04-16 DIAGNOSIS — L97513 Non-pressure chronic ulcer of other part of right foot with necrosis of muscle: Secondary | ICD-10-CM | POA: Diagnosis not present

## 2018-04-16 DIAGNOSIS — E11621 Type 2 diabetes mellitus with foot ulcer: Secondary | ICD-10-CM | POA: Diagnosis not present

## 2018-04-16 DIAGNOSIS — N183 Chronic kidney disease, stage 3 (moderate): Secondary | ICD-10-CM | POA: Diagnosis not present

## 2018-04-16 DIAGNOSIS — Z794 Long term (current) use of insulin: Secondary | ICD-10-CM | POA: Diagnosis not present

## 2018-04-16 MED ORDER — COLLAGENASE 250 UNIT/GM EX OINT: 1.0000 "application " | TOPICAL_OINTMENT | Freq: Every day | CUTANEOUS | 1 refills | Status: DC

## 2018-04-16 NOTE — Telephone Encounter (Signed)
Copied from [place] [id number]. Topic: General - Other >> Apr 16, 2018 11:26 AM Janace Aris A wrote: Reason for CRM: Judeen Hammans from [facility] called in regards to the pt, she is asking if Dr. Charlett Blake can write a script for santyl ointment for the pt's toe. Judeen Hammans also says the pt still does have some yellow discoloration on his 3rd toe.   Please advise

## 2018-04-16 NOTE — Telephone Encounter (Signed)
Rx sent to his pharmacy, notified him and his wife

## 2018-04-16 NOTE — Telephone Encounter (Signed)
Ok to write prescription for Santyl ointment to apply to affected area daily, disp 30 day supply with 1 rf and if no improvement needs to come back in for reevaluation

## 2018-04-16 NOTE — Telephone Encounter (Signed)
Copied from [place] [id number]. Topic: Quick Communication - See Telephone Encounter >> Apr 16, 2018  1:09 PM Conception Chancy, NT wrote: CRM for notification. See Telephone encounter for: 04/16/18.  Patient wife is calling and states that on 04/04/18 visit he had lab work done and was told he had a Vitamin D Deficiency and that a medication would be called in. She states the pharmacy never received that. Please advise.   [place] ([address] [place] [address] [place] [address] Phone: [phone] Fax: [phone]

## 2018-04-17 DIAGNOSIS — L97513 Non-pressure chronic ulcer of other part of right foot with necrosis of muscle: Secondary | ICD-10-CM | POA: Diagnosis not present

## 2018-04-17 DIAGNOSIS — E1140 Type 2 diabetes mellitus with diabetic neuropathy, unspecified: Secondary | ICD-10-CM | POA: Diagnosis not present

## 2018-04-17 DIAGNOSIS — I129 Hypertensive chronic kidney disease with stage 1 through stage 4 chronic kidney disease, or unspecified chronic kidney disease: Secondary | ICD-10-CM | POA: Diagnosis not present

## 2018-04-17 DIAGNOSIS — N183 Chronic kidney disease, stage 3 (moderate): Secondary | ICD-10-CM | POA: Diagnosis not present

## 2018-04-17 DIAGNOSIS — Z794 Long term (current) use of insulin: Secondary | ICD-10-CM | POA: Diagnosis not present

## 2018-04-17 DIAGNOSIS — R2681 Unsteadiness on feet: Secondary | ICD-10-CM | POA: Diagnosis not present

## 2018-04-17 DIAGNOSIS — E782 Mixed hyperlipidemia: Secondary | ICD-10-CM | POA: Diagnosis not present

## 2018-04-17 DIAGNOSIS — E11621 Type 2 diabetes mellitus with foot ulcer: Secondary | ICD-10-CM | POA: Diagnosis not present

## 2018-04-17 DIAGNOSIS — N390 Urinary tract infection, site not specified: Secondary | ICD-10-CM | POA: Diagnosis not present

## 2018-04-19 ENCOUNTER — Telehealth: Payer: Self-pay | Admitting: *Deleted

## 2018-04-19 DIAGNOSIS — E11621 Type 2 diabetes mellitus with foot ulcer: Secondary | ICD-10-CM | POA: Diagnosis not present

## 2018-04-19 DIAGNOSIS — R2681 Unsteadiness on feet: Secondary | ICD-10-CM | POA: Diagnosis not present

## 2018-04-19 DIAGNOSIS — E782 Mixed hyperlipidemia: Secondary | ICD-10-CM | POA: Diagnosis not present

## 2018-04-19 DIAGNOSIS — N183 Chronic kidney disease, stage 3 (moderate): Secondary | ICD-10-CM | POA: Diagnosis not present

## 2018-04-19 DIAGNOSIS — E1140 Type 2 diabetes mellitus with diabetic neuropathy, unspecified: Secondary | ICD-10-CM | POA: Diagnosis not present

## 2018-04-19 DIAGNOSIS — L97513 Non-pressure chronic ulcer of other part of right foot with necrosis of muscle: Secondary | ICD-10-CM | POA: Diagnosis not present

## 2018-04-19 DIAGNOSIS — I129 Hypertensive chronic kidney disease with stage 1 through stage 4 chronic kidney disease, or unspecified chronic kidney disease: Secondary | ICD-10-CM | POA: Diagnosis not present

## 2018-04-19 DIAGNOSIS — N390 Urinary tract infection, site not specified: Secondary | ICD-10-CM | POA: Diagnosis not present

## 2018-04-19 DIAGNOSIS — Z794 Long term (current) use of insulin: Secondary | ICD-10-CM | POA: Diagnosis not present

## 2018-04-19 NOTE — Telephone Encounter (Signed)
Received Physician Orders from [facility]; forwarded to provider/SLS 11/15

## 2018-04-20 ENCOUNTER — Other Ambulatory Visit: Payer: Self-pay | Admitting: Family Medicine

## 2018-04-22 DIAGNOSIS — N183 Chronic kidney disease, stage 3 (moderate): Secondary | ICD-10-CM | POA: Diagnosis not present

## 2018-04-22 DIAGNOSIS — N390 Urinary tract infection, site not specified: Secondary | ICD-10-CM | POA: Diagnosis not present

## 2018-04-22 DIAGNOSIS — I129 Hypertensive chronic kidney disease with stage 1 through stage 4 chronic kidney disease, or unspecified chronic kidney disease: Secondary | ICD-10-CM | POA: Diagnosis not present

## 2018-04-22 DIAGNOSIS — E1140 Type 2 diabetes mellitus with diabetic neuropathy, unspecified: Secondary | ICD-10-CM | POA: Diagnosis not present

## 2018-04-22 DIAGNOSIS — R2681 Unsteadiness on feet: Secondary | ICD-10-CM | POA: Diagnosis not present

## 2018-04-22 DIAGNOSIS — L97513 Non-pressure chronic ulcer of other part of right foot with necrosis of muscle: Secondary | ICD-10-CM | POA: Diagnosis not present

## 2018-04-22 DIAGNOSIS — E782 Mixed hyperlipidemia: Secondary | ICD-10-CM | POA: Diagnosis not present

## 2018-04-22 DIAGNOSIS — Z794 Long term (current) use of insulin: Secondary | ICD-10-CM | POA: Diagnosis not present

## 2018-04-22 DIAGNOSIS — E11621 Type 2 diabetes mellitus with foot ulcer: Secondary | ICD-10-CM | POA: Diagnosis not present

## 2018-04-23 ENCOUNTER — Other Ambulatory Visit: Payer: Self-pay

## 2018-04-23 DIAGNOSIS — I129 Hypertensive chronic kidney disease with stage 1 through stage 4 chronic kidney disease, or unspecified chronic kidney disease: Secondary | ICD-10-CM | POA: Diagnosis not present

## 2018-04-23 DIAGNOSIS — E782 Mixed hyperlipidemia: Secondary | ICD-10-CM | POA: Diagnosis not present

## 2018-04-23 DIAGNOSIS — N183 Chronic kidney disease, stage 3 (moderate): Secondary | ICD-10-CM | POA: Diagnosis not present

## 2018-04-23 DIAGNOSIS — R2681 Unsteadiness on feet: Secondary | ICD-10-CM | POA: Diagnosis not present

## 2018-04-23 DIAGNOSIS — L97513 Non-pressure chronic ulcer of other part of right foot with necrosis of muscle: Secondary | ICD-10-CM | POA: Diagnosis not present

## 2018-04-23 DIAGNOSIS — E559 Vitamin D deficiency, unspecified: Secondary | ICD-10-CM

## 2018-04-23 DIAGNOSIS — E1140 Type 2 diabetes mellitus with diabetic neuropathy, unspecified: Secondary | ICD-10-CM | POA: Diagnosis not present

## 2018-04-23 DIAGNOSIS — N390 Urinary tract infection, site not specified: Secondary | ICD-10-CM | POA: Diagnosis not present

## 2018-04-23 DIAGNOSIS — E11621 Type 2 diabetes mellitus with foot ulcer: Secondary | ICD-10-CM | POA: Diagnosis not present

## 2018-04-23 DIAGNOSIS — Z794 Long term (current) use of insulin: Secondary | ICD-10-CM | POA: Diagnosis not present

## 2018-04-23 MED ORDER — VITAMIN D (ERGOCALCIFEROL) 1.25 MG (50000 UNIT) PO CAPS: 50000.0000 [IU] | ORAL_CAPSULE | ORAL | Status: DC

## 2018-04-23 NOTE — Telephone Encounter (Signed)
Prescription sent to patient's pharmacy.

## 2018-04-23 NOTE — Progress Notes (Signed)
Vitamin

## 2018-04-29 LAB — FECAL OCCULT BLOOD, IMMUNOCHEMICAL: IMMUNOLOGICAL FECAL OCCULT BLOOD TEST: NEGATIVE

## 2018-05-01 DIAGNOSIS — R2681 Unsteadiness on feet: Secondary | ICD-10-CM | POA: Diagnosis not present

## 2018-05-01 DIAGNOSIS — E782 Mixed hyperlipidemia: Secondary | ICD-10-CM | POA: Diagnosis not present

## 2018-05-01 DIAGNOSIS — E11621 Type 2 diabetes mellitus with foot ulcer: Secondary | ICD-10-CM | POA: Diagnosis not present

## 2018-05-01 DIAGNOSIS — N183 Chronic kidney disease, stage 3 (moderate): Secondary | ICD-10-CM | POA: Diagnosis not present

## 2018-05-01 DIAGNOSIS — I129 Hypertensive chronic kidney disease with stage 1 through stage 4 chronic kidney disease, or unspecified chronic kidney disease: Secondary | ICD-10-CM | POA: Diagnosis not present

## 2018-05-01 DIAGNOSIS — N390 Urinary tract infection, site not specified: Secondary | ICD-10-CM | POA: Diagnosis not present

## 2018-05-01 DIAGNOSIS — L97513 Non-pressure chronic ulcer of other part of right foot with necrosis of muscle: Secondary | ICD-10-CM | POA: Diagnosis not present

## 2018-05-01 DIAGNOSIS — E1140 Type 2 diabetes mellitus with diabetic neuropathy, unspecified: Secondary | ICD-10-CM | POA: Diagnosis not present

## 2018-05-01 DIAGNOSIS — Z794 Long term (current) use of insulin: Secondary | ICD-10-CM | POA: Diagnosis not present

## 2018-05-06 ENCOUNTER — Telehealth: Payer: Self-pay | Admitting: Family Medicine

## 2018-05-06 NOTE — Telephone Encounter (Signed)
Please advise 

## 2018-05-06 NOTE — Telephone Encounter (Signed)
Copied from [place] [phone]. Topic: Quick Communication - Home Health Verbal Orders >> May 06, 2018  2:55 PM Nils Flack wrote: Caller/Agency: [facility]  Callback Number: [phone] l/m if no answer  Requesting OT/PT/Skilled Nursing/Social Work: change the treatment to care for the wound to silver algenate instead of hydrogel  Frequency:

## 2018-05-07 NOTE — Telephone Encounter (Signed)
OK to contact his home health agency and agree to all of the requests including switching to silver algenate daily prn and all the services for evaluate and treat.

## 2018-05-08 DIAGNOSIS — E11621 Type 2 diabetes mellitus with foot ulcer: Secondary | ICD-10-CM | POA: Diagnosis not present

## 2018-05-08 DIAGNOSIS — Z794 Long term (current) use of insulin: Secondary | ICD-10-CM | POA: Diagnosis not present

## 2018-05-08 DIAGNOSIS — E1140 Type 2 diabetes mellitus with diabetic neuropathy, unspecified: Secondary | ICD-10-CM | POA: Diagnosis not present

## 2018-05-08 DIAGNOSIS — N183 Chronic kidney disease, stage 3 (moderate): Secondary | ICD-10-CM | POA: Diagnosis not present

## 2018-05-08 DIAGNOSIS — Z7982 Long term (current) use of aspirin: Secondary | ICD-10-CM | POA: Diagnosis not present

## 2018-05-08 DIAGNOSIS — L97513 Non-pressure chronic ulcer of other part of right foot with necrosis of muscle: Secondary | ICD-10-CM | POA: Diagnosis not present

## 2018-05-08 DIAGNOSIS — Z9181 History of falling: Secondary | ICD-10-CM | POA: Diagnosis not present

## 2018-05-08 DIAGNOSIS — I129 Hypertensive chronic kidney disease with stage 1 through stage 4 chronic kidney disease, or unspecified chronic kidney disease: Secondary | ICD-10-CM | POA: Diagnosis not present

## 2018-05-08 DIAGNOSIS — Z7951 Long term (current) use of inhaled steroids: Secondary | ICD-10-CM | POA: Diagnosis not present

## 2018-05-09 ENCOUNTER — Ambulatory Visit: Payer: Medicare HMO | Admitting: Family Medicine

## 2018-05-09 NOTE — Telephone Encounter (Signed)
Verbal orders given  

## 2018-05-13 ENCOUNTER — Telehealth: Payer: Self-pay

## 2018-05-13 DIAGNOSIS — E559 Vitamin D deficiency, unspecified: Secondary | ICD-10-CM

## 2018-05-13 MED ORDER — VITAMIN D (ERGOCALCIFEROL) 1.25 MG (50000 UNIT) PO CAPS: 50000.0000 [IU] | ORAL_CAPSULE | ORAL | 0 refills | Status: DC

## 2018-05-13 NOTE — Telephone Encounter (Signed)
Pt. requesting vitamin D rx to be sent into [facility]; previous order was ordered as "clinic-administered".

## 2018-05-14 ENCOUNTER — Ambulatory Visit: Payer: Medicare HMO | Admitting: Family Medicine

## 2018-05-14 DIAGNOSIS — L97513 Non-pressure chronic ulcer of other part of right foot with necrosis of muscle: Secondary | ICD-10-CM | POA: Diagnosis not present

## 2018-05-14 DIAGNOSIS — Z7951 Long term (current) use of inhaled steroids: Secondary | ICD-10-CM | POA: Diagnosis not present

## 2018-05-14 DIAGNOSIS — Z794 Long term (current) use of insulin: Secondary | ICD-10-CM | POA: Diagnosis not present

## 2018-05-14 DIAGNOSIS — N183 Chronic kidney disease, stage 3 (moderate): Secondary | ICD-10-CM | POA: Diagnosis not present

## 2018-05-14 DIAGNOSIS — E1140 Type 2 diabetes mellitus with diabetic neuropathy, unspecified: Secondary | ICD-10-CM | POA: Diagnosis not present

## 2018-05-14 DIAGNOSIS — E11621 Type 2 diabetes mellitus with foot ulcer: Secondary | ICD-10-CM | POA: Diagnosis not present

## 2018-05-14 DIAGNOSIS — I129 Hypertensive chronic kidney disease with stage 1 through stage 4 chronic kidney disease, or unspecified chronic kidney disease: Secondary | ICD-10-CM | POA: Diagnosis not present

## 2018-05-14 DIAGNOSIS — Z9181 History of falling: Secondary | ICD-10-CM | POA: Diagnosis not present

## 2018-05-14 DIAGNOSIS — Z7982 Long term (current) use of aspirin: Secondary | ICD-10-CM | POA: Diagnosis not present

## 2018-05-20 ENCOUNTER — Telehealth: Payer: Self-pay | Admitting: *Deleted

## 2018-05-20 ENCOUNTER — Encounter (INDEPENDENT_AMBULATORY_CARE_PROVIDER_SITE_OTHER): Payer: Medicare HMO | Admitting: Ophthalmology

## 2018-05-20 NOTE — Telephone Encounter (Signed)
Received Physician Orders from [facility]; forwarded to provider/SLS 12/16

## 2018-05-23 ENCOUNTER — Encounter: Payer: Self-pay | Admitting: Family Medicine

## 2018-05-23 DIAGNOSIS — I129 Hypertensive chronic kidney disease with stage 1 through stage 4 chronic kidney disease, or unspecified chronic kidney disease: Secondary | ICD-10-CM | POA: Diagnosis not present

## 2018-05-23 DIAGNOSIS — L97513 Non-pressure chronic ulcer of other part of right foot with necrosis of muscle: Secondary | ICD-10-CM | POA: Diagnosis not present

## 2018-05-23 DIAGNOSIS — Z7951 Long term (current) use of inhaled steroids: Secondary | ICD-10-CM | POA: Diagnosis not present

## 2018-05-23 DIAGNOSIS — Z9181 History of falling: Secondary | ICD-10-CM | POA: Diagnosis not present

## 2018-05-23 DIAGNOSIS — E11621 Type 2 diabetes mellitus with foot ulcer: Secondary | ICD-10-CM | POA: Diagnosis not present

## 2018-05-23 DIAGNOSIS — E1140 Type 2 diabetes mellitus with diabetic neuropathy, unspecified: Secondary | ICD-10-CM | POA: Diagnosis not present

## 2018-05-23 DIAGNOSIS — N183 Chronic kidney disease, stage 3 (moderate): Secondary | ICD-10-CM | POA: Diagnosis not present

## 2018-05-23 DIAGNOSIS — Z794 Long term (current) use of insulin: Secondary | ICD-10-CM | POA: Diagnosis not present

## 2018-05-23 DIAGNOSIS — Z7982 Long term (current) use of aspirin: Secondary | ICD-10-CM | POA: Diagnosis not present

## 2018-05-27 ENCOUNTER — Telehealth: Payer: Self-pay | Admitting: *Deleted

## 2018-05-27 NOTE — Telephone Encounter (Signed)
Received Physician Orders from [facility]; forwarded to provider/SLS 12/23

## 2018-05-28 DIAGNOSIS — I129 Hypertensive chronic kidney disease with stage 1 through stage 4 chronic kidney disease, or unspecified chronic kidney disease: Secondary | ICD-10-CM | POA: Diagnosis not present

## 2018-05-28 DIAGNOSIS — Z794 Long term (current) use of insulin: Secondary | ICD-10-CM | POA: Diagnosis not present

## 2018-05-28 DIAGNOSIS — Z9181 History of falling: Secondary | ICD-10-CM | POA: Diagnosis not present

## 2018-05-28 DIAGNOSIS — L97513 Non-pressure chronic ulcer of other part of right foot with necrosis of muscle: Secondary | ICD-10-CM | POA: Diagnosis not present

## 2018-05-28 DIAGNOSIS — Z7951 Long term (current) use of inhaled steroids: Secondary | ICD-10-CM | POA: Diagnosis not present

## 2018-05-28 DIAGNOSIS — Z7982 Long term (current) use of aspirin: Secondary | ICD-10-CM | POA: Diagnosis not present

## 2018-05-28 DIAGNOSIS — N183 Chronic kidney disease, stage 3 (moderate): Secondary | ICD-10-CM | POA: Diagnosis not present

## 2018-05-28 DIAGNOSIS — E1140 Type 2 diabetes mellitus with diabetic neuropathy, unspecified: Secondary | ICD-10-CM | POA: Diagnosis not present

## 2018-05-28 DIAGNOSIS — E11621 Type 2 diabetes mellitus with foot ulcer: Secondary | ICD-10-CM | POA: Diagnosis not present

## 2018-06-04 DIAGNOSIS — Z9181 History of falling: Secondary | ICD-10-CM | POA: Diagnosis not present

## 2018-06-04 DIAGNOSIS — Z7951 Long term (current) use of inhaled steroids: Secondary | ICD-10-CM | POA: Diagnosis not present

## 2018-06-04 DIAGNOSIS — E11621 Type 2 diabetes mellitus with foot ulcer: Secondary | ICD-10-CM | POA: Diagnosis not present

## 2018-06-04 DIAGNOSIS — N183 Chronic kidney disease, stage 3 (moderate): Secondary | ICD-10-CM | POA: Diagnosis not present

## 2018-06-04 DIAGNOSIS — I129 Hypertensive chronic kidney disease with stage 1 through stage 4 chronic kidney disease, or unspecified chronic kidney disease: Secondary | ICD-10-CM | POA: Diagnosis not present

## 2018-06-04 DIAGNOSIS — E1140 Type 2 diabetes mellitus with diabetic neuropathy, unspecified: Secondary | ICD-10-CM | POA: Diagnosis not present

## 2018-06-04 DIAGNOSIS — Z7982 Long term (current) use of aspirin: Secondary | ICD-10-CM | POA: Diagnosis not present

## 2018-06-04 DIAGNOSIS — Z794 Long term (current) use of insulin: Secondary | ICD-10-CM | POA: Diagnosis not present

## 2018-06-04 DIAGNOSIS — L97513 Non-pressure chronic ulcer of other part of right foot with necrosis of muscle: Secondary | ICD-10-CM | POA: Diagnosis not present

## 2018-06-07 ENCOUNTER — Encounter: Payer: Self-pay | Admitting: Vascular Surgery

## 2018-06-07 ENCOUNTER — Ambulatory Visit: Payer: Medicare HMO | Admitting: Vascular Surgery

## 2018-06-07 ENCOUNTER — Encounter: Payer: Self-pay | Admitting: *Deleted

## 2018-06-07 ENCOUNTER — Other Ambulatory Visit: Payer: Self-pay | Admitting: *Deleted

## 2018-06-07 ENCOUNTER — Other Ambulatory Visit: Payer: Self-pay

## 2018-06-07 VITALS — BP 162/82 | HR 74 | Temp 97.6°F | Resp 16 | Ht 69.0 in | Wt 170.0 lb

## 2018-06-07 DIAGNOSIS — I739 Peripheral vascular disease, unspecified: Secondary | ICD-10-CM | POA: Diagnosis not present
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
## 2018-06-10 ENCOUNTER — Telehealth: Payer: Self-pay | Admitting: *Deleted

## 2018-06-10 NOTE — Telephone Encounter (Signed)
Received Physician Orders from [facility]; forwarded to provider/SLS 01/06  Received Home Health Certification and Plan of Care; forwarded to provider/SLS 01/06

## 2018-06-11 DIAGNOSIS — E1140 Type 2 diabetes mellitus with diabetic neuropathy, unspecified: Secondary | ICD-10-CM | POA: Diagnosis not present

## 2018-06-11 DIAGNOSIS — Z9181 History of falling: Secondary | ICD-10-CM | POA: Diagnosis not present

## 2018-06-11 DIAGNOSIS — E11621 Type 2 diabetes mellitus with foot ulcer: Secondary | ICD-10-CM | POA: Diagnosis not present

## 2018-06-11 DIAGNOSIS — L97513 Non-pressure chronic ulcer of other part of right foot with necrosis of muscle: Secondary | ICD-10-CM | POA: Diagnosis not present

## 2018-06-11 DIAGNOSIS — Z7951 Long term (current) use of inhaled steroids: Secondary | ICD-10-CM | POA: Diagnosis not present

## 2018-06-11 DIAGNOSIS — N183 Chronic kidney disease, stage 3 (moderate): Secondary | ICD-10-CM | POA: Diagnosis not present

## 2018-06-11 DIAGNOSIS — Z7982 Long term (current) use of aspirin: Secondary | ICD-10-CM | POA: Diagnosis not present

## 2018-06-11 DIAGNOSIS — I129 Hypertensive chronic kidney disease with stage 1 through stage 4 chronic kidney disease, or unspecified chronic kidney disease: Secondary | ICD-10-CM | POA: Diagnosis not present

## 2018-06-11 DIAGNOSIS — Z794 Long term (current) use of insulin: Secondary | ICD-10-CM | POA: Diagnosis not present

## 2018-06-19 DIAGNOSIS — Z794 Long term (current) use of insulin: Secondary | ICD-10-CM | POA: Diagnosis not present

## 2018-06-19 DIAGNOSIS — N183 Chronic kidney disease, stage 3 (moderate): Secondary | ICD-10-CM | POA: Diagnosis not present

## 2018-06-19 DIAGNOSIS — E1140 Type 2 diabetes mellitus with diabetic neuropathy, unspecified: Secondary | ICD-10-CM | POA: Diagnosis not present

## 2018-06-19 DIAGNOSIS — I129 Hypertensive chronic kidney disease with stage 1 through stage 4 chronic kidney disease, or unspecified chronic kidney disease: Secondary | ICD-10-CM | POA: Diagnosis not present

## 2018-06-19 DIAGNOSIS — E11621 Type 2 diabetes mellitus with foot ulcer: Secondary | ICD-10-CM | POA: Diagnosis not present

## 2018-06-19 DIAGNOSIS — Z9181 History of falling: Secondary | ICD-10-CM | POA: Diagnosis not present

## 2018-06-19 DIAGNOSIS — L97513 Non-pressure chronic ulcer of other part of right foot with necrosis of muscle: Secondary | ICD-10-CM | POA: Diagnosis not present

## 2018-06-19 DIAGNOSIS — Z7951 Long term (current) use of inhaled steroids: Secondary | ICD-10-CM | POA: Diagnosis not present

## 2018-06-19 DIAGNOSIS — Z7982 Long term (current) use of aspirin: Secondary | ICD-10-CM | POA: Diagnosis not present

## 2018-06-26 DIAGNOSIS — E11621 Type 2 diabetes mellitus with foot ulcer: Secondary | ICD-10-CM | POA: Diagnosis not present

## 2018-06-26 DIAGNOSIS — Z7982 Long term (current) use of aspirin: Secondary | ICD-10-CM | POA: Diagnosis not present

## 2018-06-26 DIAGNOSIS — Z7951 Long term (current) use of inhaled steroids: Secondary | ICD-10-CM | POA: Diagnosis not present

## 2018-06-26 DIAGNOSIS — E1140 Type 2 diabetes mellitus with diabetic neuropathy, unspecified: Secondary | ICD-10-CM | POA: Diagnosis not present

## 2018-06-26 DIAGNOSIS — Z794 Long term (current) use of insulin: Secondary | ICD-10-CM | POA: Diagnosis not present

## 2018-06-26 DIAGNOSIS — I129 Hypertensive chronic kidney disease with stage 1 through stage 4 chronic kidney disease, or unspecified chronic kidney disease: Secondary | ICD-10-CM | POA: Diagnosis not present

## 2018-06-26 DIAGNOSIS — Z9181 History of falling: Secondary | ICD-10-CM | POA: Diagnosis not present

## 2018-06-26 DIAGNOSIS — L97513 Non-pressure chronic ulcer of other part of right foot with necrosis of muscle: Secondary | ICD-10-CM | POA: Diagnosis not present

## 2018-06-26 DIAGNOSIS — N183 Chronic kidney disease, stage 3 (moderate): Secondary | ICD-10-CM | POA: Diagnosis not present

## 2018-06-27 ENCOUNTER — Telehealth: Payer: Self-pay | Admitting: Vascular Surgery

## 2018-06-27 ENCOUNTER — Other Ambulatory Visit: Payer: Self-pay

## 2018-06-27 ENCOUNTER — Ambulatory Visit (HOSPITAL_COMMUNITY)
Admission: RE | Admit: 2018-06-27 | Discharge: 2018-06-27 | Disposition: A | Discharge status: Home / Self Care | Payer: Medicare HMO | Attending: Vascular Surgery | Admitting: Vascular Surgery

## 2018-06-27 ENCOUNTER — Encounter (HOSPITAL_COMMUNITY)
Admission: RE | Disposition: A | Discharge status: Home / Self Care | Payer: Self-pay | Source: Home / Self Care | Attending: Vascular Surgery

## 2018-06-27 DIAGNOSIS — E1121 Type 2 diabetes mellitus with diabetic nephropathy: Secondary | ICD-10-CM | POA: Insufficient documentation

## 2018-06-27 DIAGNOSIS — Z823 Family history of stroke: Secondary | ICD-10-CM | POA: Diagnosis not present

## 2018-06-27 DIAGNOSIS — I70235 Atherosclerosis of native arteries of right leg with ulceration of other part of foot: Secondary | ICD-10-CM | POA: Diagnosis not present

## 2018-06-27 DIAGNOSIS — Z7951 Long term (current) use of inhaled steroids: Secondary | ICD-10-CM | POA: Diagnosis not present

## 2018-06-27 DIAGNOSIS — Z888 Allergy status to other drugs, medicaments and biological substances status: Secondary | ICD-10-CM | POA: Diagnosis not present

## 2018-06-27 DIAGNOSIS — Z79899 Other long term (current) drug therapy: Secondary | ICD-10-CM | POA: Diagnosis not present

## 2018-06-27 DIAGNOSIS — Z794 Long term (current) use of insulin: Secondary | ICD-10-CM | POA: Insufficient documentation

## 2018-06-27 DIAGNOSIS — Z7982 Long term (current) use of aspirin: Secondary | ICD-10-CM | POA: Diagnosis not present

## 2018-06-27 DIAGNOSIS — Z885 Allergy status to narcotic agent status: Secondary | ICD-10-CM | POA: Diagnosis not present

## 2018-06-27 DIAGNOSIS — I998 Other disorder of circulatory system: Secondary | ICD-10-CM | POA: Diagnosis not present

## 2018-06-27 DIAGNOSIS — Z833 Family history of diabetes mellitus: Secondary | ICD-10-CM | POA: Diagnosis not present

## 2018-06-27 DIAGNOSIS — Z8249 Family history of ischemic heart disease and other diseases of the circulatory system: Secondary | ICD-10-CM | POA: Diagnosis not present

## 2018-06-27 DIAGNOSIS — E1140 Type 2 diabetes mellitus with diabetic neuropathy, unspecified: Secondary | ICD-10-CM | POA: Diagnosis not present

## 2018-06-27 DIAGNOSIS — E11621 Type 2 diabetes mellitus with foot ulcer: Secondary | ICD-10-CM | POA: Diagnosis not present

## 2018-06-27 DIAGNOSIS — I10 Essential (primary) hypertension: Secondary | ICD-10-CM | POA: Diagnosis not present

## 2018-06-27 DIAGNOSIS — L97519 Non-pressure chronic ulcer of other part of right foot with unspecified severity: Secondary | ICD-10-CM | POA: Diagnosis not present

## 2018-06-27 DIAGNOSIS — Z87891 Personal history of nicotine dependence: Secondary | ICD-10-CM | POA: Diagnosis not present

## 2018-06-27 HISTORY — PX: ABDOMINAL AORTOGRAM W/LOWER EXTREMITY: CATH118223

## 2018-06-27 HISTORY — PX: PERIPHERAL VASCULAR ATHERECTOMY: CATH118256

## 2018-06-27 LAB — POCT ACTIVATED CLOTTING TIME: Activated Clotting Time: 235 s

## 2018-06-27 LAB — POCT I-STAT 4, (NA,K, GLUC, HGB,HCT)
GLUCOSE: 296 mg/dL — AB (ref 70–99)
HEMATOCRIT: 36 % — AB (ref 39.0–52.0)
Hemoglobin: 12.2 g/dL — ABNORMAL LOW (ref 13.0–17.0)
Potassium: 4.9 mmol/L (ref 3.5–5.1)
Sodium: 142 mmol/L (ref 135–145)

## 2018-06-27 LAB — GLUCOSE, CAPILLARY
GLUCOSE-CAPILLARY: 282 mg/dL — AB (ref 70–99)
Glucose-Capillary: 238 mg/dL — ABNORMAL HIGH (ref 70–99)

## 2018-06-27 LAB — POCT I-STAT CREATININE: Creatinine, Ser: 2.2 mg/dL — ABNORMAL HIGH (ref 0.61–1.24)

## 2018-06-27 SURGERY — ABDOMINAL AORTOGRAM W/LOWER EXTREMITY
Anesthesia: LOCAL | Laterality: Right

## 2018-06-27 MED ORDER — SODIUM CHLORIDE 0.9 % IV SOLN
INTRAVENOUS | Status: DC
  Administered 2018-06-27: 08:00:00 via INTRAVENOUS

## 2018-06-27 MED ORDER — FENTANYL CITRATE (PF) 100 MCG/2ML IJ SOLN
INTRAMUSCULAR | Status: DC | PRN
  Administered 2018-06-27 (×2): 25 ug via INTRAVENOUS

## 2018-06-27 MED ORDER — IODIXANOL 320 MG/ML IV SOLN
INTRAVENOUS | Status: DC | PRN
  Administered 2018-06-27: 45 mL via INTRA_ARTERIAL

## 2018-06-27 MED ORDER — CLOPIDOGREL BISULFATE 300 MG PO TABS
ORAL_TABLET | ORAL | Status: AC
  Filled 2018-06-27: qty 1

## 2018-06-27 MED ORDER — ACETAMINOPHEN 325 MG PO TABS: 650.0000 mg | ORAL_TABLET | ORAL | Status: DC | PRN

## 2018-06-27 MED ORDER — CLOPIDOGREL BISULFATE 300 MG PO TABS: 300.0000 mg | ORAL_TABLET | Freq: Once | ORAL | Status: DC

## 2018-06-27 MED ORDER — SODIUM CHLORIDE 0.9% FLUSH: 3.0000 mL | INTRAVENOUS | Status: DC | PRN

## 2018-06-27 MED ORDER — NITROGLYCERIN IN D5W 200-5 MCG/ML-% IV SOLN
INTRAVENOUS | Status: AC
  Filled 2018-06-27: qty 250

## 2018-06-27 MED ORDER — VERAPAMIL HCL 2.5 MG/ML IV SOLN
INTRAVENOUS | Status: AC
  Filled 2018-06-27: qty 2

## 2018-06-27 MED ORDER — SODIUM CHLORIDE 0.9 % IV SOLN: 250.0000 mL | INTRAVENOUS | Status: DC | PRN

## 2018-06-27 MED ORDER — MIDAZOLAM HCL 2 MG/2ML IJ SOLN
INTRAMUSCULAR | Status: DC | PRN
  Administered 2018-06-27 (×2): 0.5 mg via INTRAVENOUS

## 2018-06-27 MED ORDER — LABETALOL HCL 5 MG/ML IV SOLN: 10.0000 mg | INTRAVENOUS | Status: DC | PRN

## 2018-06-27 MED ORDER — SODIUM CHLORIDE 0.9 % WEIGHT BASED INFUSION: 1.0000 mL/kg/h | INTRAVENOUS | Status: DC

## 2018-06-27 MED ORDER — HEPARIN (PORCINE) IN NACL 1000-0.9 UT/500ML-% IV SOLN
INTRAVENOUS | Status: DC | PRN
  Administered 2018-06-27: 500 mL

## 2018-06-27 MED ORDER — MIDAZOLAM HCL 2 MG/2ML IJ SOLN
INTRAMUSCULAR | Status: AC
  Filled 2018-06-27: qty 2

## 2018-06-27 MED ORDER — HEPARIN SODIUM (PORCINE) 1000 UNIT/ML IJ SOLN
INTRAMUSCULAR | Status: AC
  Filled 2018-06-27: qty 1

## 2018-06-27 MED ORDER — CLOPIDOGREL BISULFATE 75 MG PO TABS: 75.0000 mg | ORAL_TABLET | Freq: Every day | ORAL | 3 refills | Status: DC

## 2018-06-27 MED ORDER — OXYCODONE HCL 5 MG PO TABS: 5.0000 mg | ORAL_TABLET | ORAL | Status: DC | PRN

## 2018-06-27 MED ORDER — ONDANSETRON HCL 4 MG/2ML IJ SOLN: 4.0000 mg | Freq: Four times a day (QID) | INTRAMUSCULAR | Status: DC | PRN

## 2018-06-27 MED ORDER — LIDOCAINE HCL (PF) 1 % IJ SOLN
INTRAMUSCULAR | Status: DC | PRN
  Administered 2018-06-27: 15 mL

## 2018-06-27 MED ORDER — HEPARIN (PORCINE) IN NACL 1000-0.9 UT/500ML-% IV SOLN
INTRAVENOUS | Status: AC
  Filled 2018-06-27: qty 1000

## 2018-06-27 MED ORDER — HYDRALAZINE HCL 20 MG/ML IJ SOLN: 5.0000 mg | INTRAMUSCULAR | Status: DC | PRN

## 2018-06-27 MED ORDER — CLOPIDOGREL BISULFATE 300 MG PO TABS
ORAL_TABLET | ORAL | Status: DC | PRN
  Administered 2018-06-27: 300 mg via ORAL

## 2018-06-27 MED ORDER — FENTANYL CITRATE (PF) 100 MCG/2ML IJ SOLN
INTRAMUSCULAR | Status: AC
  Filled 2018-06-27: qty 2

## 2018-06-27 MED ORDER — VIPERSLIDE LUBRICANT OPTIME
TOPICAL | Status: DC | PRN
  Administered 2018-06-27: 09:00:00 via SURGICAL_CAVITY

## 2018-06-27 MED ORDER — HEPARIN SODIUM (PORCINE) 1000 UNIT/ML IJ SOLN
INTRAMUSCULAR | Status: DC | PRN
  Administered 2018-06-27: 3000 [IU] via INTRAVENOUS
  Administered 2018-06-27: 8000 [IU] via INTRAVENOUS

## 2018-06-27 MED ORDER — LIDOCAINE HCL (PF) 1 % IJ SOLN
INTRAMUSCULAR | Status: AC
  Filled 2018-06-27: qty 30

## 2018-06-27 MED ORDER — SODIUM CHLORIDE 0.9% FLUSH: 3.0000 mL | Freq: Two times a day (BID) | INTRAVENOUS | Status: DC

## 2018-06-27 MED ORDER — CLOPIDOGREL BISULFATE 75 MG PO TABS: 75.0000 mg | ORAL_TABLET | Freq: Every day | ORAL | Status: DC

## 2018-06-27 SURGICAL SUPPLY — 31 items
BAG SNAP BAND KOVER 36X36 (MISCELLANEOUS) ×1 IMPLANT
BALLN IN.PACT DCB 4X40 (BALLOONS) ×3
BALLN STERLING OTW 3X100X150 (BALLOONS) ×3
BALLOON STERLING OTW 3X100X150 (BALLOONS) IMPLANT
CATH OMNI FLUSH 5F 65CM (CATHETERS) ×1 IMPLANT
CATH QUICKCROSS ANG SELECT (CATHETERS) ×1 IMPLANT
CATH TEMPO AQUA 5F 100CM (CATHETERS) ×1 IMPLANT
CLOSURE MYNX CONTROL 6F/7F (Vascular Products) ×1 IMPLANT
COVER DOME SNAP 22 D (MISCELLANEOUS) ×1 IMPLANT
DCB IN.PACT 4X40 (BALLOONS) IMPLANT
DIAMONDBACK CLASSIC OAS 1.5MM (CATHETERS) ×3
FILTER CO2 0.2 MICRON (VASCULAR PRODUCTS) ×1 IMPLANT
KIT ENCORE 26 ADVANTAGE (KITS) ×1 IMPLANT
KIT MICROPUNCTURE NIT STIFF (SHEATH) ×1 IMPLANT
KIT PV (KITS) ×3 IMPLANT
LUBRICANT VIPERSLIDE CORONARY (MISCELLANEOUS) ×1 IMPLANT
PATCH THROMBIX TOPICAL PLAIN (HEMOSTASIS) ×1 IMPLANT
RESERVOIR CO2 (VASCULAR PRODUCTS) ×1 IMPLANT
SET FLUSH CO2 (MISCELLANEOUS) ×1 IMPLANT
SHEATH HIGHFLEX ANSEL 6FRX55 (SHEATH) ×1 IMPLANT
SHEATH PINNACLE 5F 10CM (SHEATH) ×1 IMPLANT
SHEATH PINNACLE 6F 10CM (SHEATH) ×1 IMPLANT
SHEATH PROBE COVER 6X72 (BAG) ×1 IMPLANT
SHIELD RADPAD SCOOP 12X17 (MISCELLANEOUS) ×1 IMPLANT
SYSTEM DIMNDBCK CLSC OAS 1.5MM (CATHETERS) IMPLANT
TRANSDUCER W/STOPCOCK (MISCELLANEOUS) ×3 IMPLANT
TRAY PV CATH (CUSTOM PROCEDURE TRAY) ×3 IMPLANT
WIRE BENTSON .035X145CM (WIRE) ×1 IMPLANT
WIRE G V18X300CM (WIRE) ×1 IMPLANT
WIRE ROSEN-J .035X260CM (WIRE) ×1 IMPLANT
WIRE VIPER ADVANCE .017X335CM (WIRE) ×1 IMPLANT

## 2018-06-27 NOTE — H&P (Signed)
   History and Physical Update  The patient was interviewed and re-examined.  The patient's previous History and Physical has been reviewed and is unchanged from recent office visit. Plan for aortogram with CO2 and right lower angiogram with possible intervention.   Maurizio Geno C. Donzetta Matters, MD [facility] [place] Office: [phone] Pager: [phone]  06/27/2018, 8:08 AM

## 2018-06-27 NOTE — Telephone Encounter (Signed)
-----   Message from Waynetta Sandy, MD sent at 06/27/2018 10:17 AM EST ----- Jermaine Mitchell [id number] [birth year]  06/27/2018 Pre-operative Diagnosis: critical right lower extremity ischemia Post-operative diagnosis:  Same Surgeon:  Erlene Quan C. Donzetta Matters, MD Procedure Performed: 1.  US guided cannulation of left common femoral artery  2.  CO2 aortogram with right lower extremity angiogram 3.  Diamondback atherectomy of right popliteal, tibioperoneal trunk, peroneal, posterior tibial arteries with 1.5 classic 4.  Plain balloon angioplasty of right peroneal and posterior tibial arteries with 3 mm balloon 5.  Drug-coated balloon angioplasty of right TP trunk and popliteal arteries with 4 x 40 mm in.pact 6.  Minx device closure left common femoral artery 7.  Moderate sedation with fentanyl and Versed for 73 minutes  F/u in 4-6 weeks with right duplex and ABI's with me/np/pa

## 2018-06-27 NOTE — Discharge Instructions (Signed)
Moderate Conscious Sedation, Adult, Care After °These instructions provide you with information about caring for yourself after your procedure. Your health care provider may also give you more specific instructions. Your treatment has been planned according to current medical practices, but problems sometimes occur. Call your health care provider if you have any problems or questions after your procedure. °What can I expect after the procedure? °After your procedure, it is common: °· To feel sleepy for several hours. °· To feel clumsy and have poor balance for several hours. °· To have poor judgment for several hours. °· To vomit if you eat too soon. °Follow these instructions at home: °For at least 24 hours after the procedure: ° °· Do not: °? Participate in activities where you could fall or become injured. °? Drive. °? Use heavy machinery. °? Drink alcohol. °? Take sleeping pills or medicines that cause drowsiness. °? Make important decisions or sign legal documents. °? Take care of children on your own. °· Rest. °Eating and drinking °· Follow the diet recommended by your health care provider. °· If you vomit: °? Drink water, juice, or soup when you can drink without vomiting. °? Make sure you have little or no nausea before eating solid foods. °General instructions °· Have a responsible adult stay with you until you are awake and alert. °· Take over-the-counter and prescription medicines only as told by your health care provider. °· If you smoke, do not smoke without supervision. °· Keep all follow-up visits as told by your health care provider. This is important. °Contact a health care provider if: °· You keep feeling nauseous or you keep vomiting. °· You feel light-headed. °· You develop a rash. °· You have a fever. °Get help right away if: °· You have trouble breathing. °This information is not intended to replace advice given to you by your health care provider. Make sure you discuss any questions you have  with your health care provider. °Document Released: 03/12/2013 Document Revised: 10/25/2015 Document Reviewed: 09/11/2015 °Elsevier Interactive Patient Education © 2019 [employer] °Femoral Site Care °This sheet gives you information about how to care for yourself after your procedure. Your health care provider may also give you more specific instructions. If you have problems or questions, contact your health care provider. °What can I expect after the procedure? °After the procedure, it is common to have: °· Bruising that usually fades within 1-2 weeks. °· Tenderness at the site. °Follow these instructions at home: °Wound care °· Follow instructions from your health care provider about how to take care of your insertion site. Make sure you: °? Wash your hands with soap and water before you change your bandage (dressing). If soap and water are not available, use hand sanitizer. °? Change your dressing as told by your health care provider. °? Leave stitches (sutures), skin glue, or adhesive strips in place. These skin closures may need to stay in place for 2 weeks or longer. If adhesive strip edges start to loosen and curl up, you may trim the loose edges. Do not remove adhesive strips completely unless your health care provider tells you to do that. °· Do not take baths, swim, or use a hot tub until your health care provider approves. °· You may shower 24-48 hours after the procedure or as told by your health care provider. °? Gently wash the site with plain soap and water. °? Pat the area dry with a clean towel. °? Do not rub the site. This may cause   bleeding. °· Do not apply powder or lotion to the site. Keep the site clean and dry. °· Check your femoral site every day for signs of infection. Check for: °? Redness, swelling, or pain. °? Fluid or blood. °? Warmth. °? Pus or a bad smell. °Activity °· For the first 2-3 days after your procedure, or as long as directed: °? Avoid climbing stairs as much as  possible. °? Do not squat. °· Do not lift anything that is heavier than 10 lb (4.5 kg), or the limit that you are told, until your health care provider says that it is safe. °· Rest as directed. °? Avoid sitting for a long time without moving. Get up to take short walks every 1-2 hours. °· Do not drive for 24 hours if you were given a medicine to help you relax (sedative). °General instructions °· Take over-the-counter and prescription medicines only as told by your health care provider. °· Keep all follow-up visits as told by your health care provider. This is important. °Contact a health care provider if you have: °· A fever or chills. °· You have redness, swelling, or pain around your insertion site. °Get help right away if: °· The catheter insertion area swells very fast. °· You pass out. °· You suddenly start to sweat or your skin gets clammy. °· The catheter insertion area is bleeding, and the bleeding does not stop when you hold steady pressure on the area. °· The area near or just beyond the catheter insertion site becomes pale, cool, tingly, or numb. °These symptoms may represent a serious problem that is an emergency. Do not wait to see if the symptoms will go away. Get medical help right away. Call your local emergency services (911 in the [place] Do not drive yourself to the hospital. °Summary °· After the procedure, it is common to have bruising that usually fades within 1-2 weeks. °· Check your femoral site every day for signs of infection. °· Do not lift anything that is heavier than 10 lb (4.5 kg), or the limit that you are told, until your health care provider says that it is safe. °This information is not intended to replace advice given to you by your health care provider. Make sure you discuss any questions you have with your health care provider. °Document Released: 01/23/2014 Document Revised: 06/04/2017 Document Reviewed: 06/04/2017 °Elsevier Interactive Patient Education © 2019 [employer]  Inc[employer] ° °

## 2018-06-27 NOTE — Op Note (Signed)
Patient name: Jermaine Mitchell MRN: [id number] DOB: 05/09/1941 Sex: male  06/27/2018 Pre-operative Diagnosis: critical right lower extremity ischemia Post-operative diagnosis:  Same Surgeon:  Erlene Quan C. Donzetta Matters, MD Procedure Performed: 1.  US guided cannulation of left common femoral artery  2.  CO2 aortogram with right lower extremity angiogram 3.  Diamondback atherectomy of right popliteal, tibioperoneal trunk, peroneal, posterior tibial arteries with 1.5 classic 4.  Plain balloon angioplasty of right peroneal and posterior tibial arteries with 3 mm balloon 5.  Drug-coated balloon angioplasty of right TP trunk and popliteal arteries with 4 x 40 mm in.pact 6.  Minx device closure left common femoral artery 7.  Moderate sedation with fentanyl and Versed for 73 minutes  Indications: 78 year old male without previous vascular history recently was admitted with infection of his right foot.  He has dark dry gangrenous appearance is to his right third toe but does not appear to need amputation as the infection is healed with antibiotics.  He does have a severely depressed ABI with monophasic signals distally and is indicated for angiogram with possible intervention.  Findings: With CO2 injection the aorta and iliac segment appeared free of disease.  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  Below the knee he has a 70% lesion in the popliteal artery with a 90% lesion in the tibioperoneal trunk and concomitant greater than 70% lesions in the peroneal and posterior tibial arteries proximally.  After treatment with atherectomy and plain balloon angioplasty of the tibial arteries and drug-coated balloon angioplasty of the tibioperoneal trunk and popliteal artery there is 0% residual stenosis.  There is runoff dominant via the posterior tibial artery filling the foot and the peroneal artery gives way to a DP.   Procedure:  The patient was identified in the holding  area and taken to room 8.  The patient was then placed supine on the table and prepped and draped in the usual sterile fashion.  A time out was called.  Ultrasound was used to evaluate the left common femoral artery which was noted to be patent.  The area was anesthetized 1% lidocaine and cannulated with direct ultrasound visualization with micropuncture needle followed by wire and sheath.  Image was saved the permanent record.  Bentson wire was placed followed by 5 [place] sheath.  Omni catheter was placed to the level of L1 and CO2 aortogram was performed.  We then crossed the bifurcation with Omni catheter Bentson wire perform right lower extremity angiogram to level the popliteal.  We then used a Rosen wire and straight catheter to the level to popliteal performed contrasted angiogram demonstrating our significant stenosis in our tibial arteries.  We then exchanged for a long 6 French sheath over the [place] wire and the patient was given 1000s of heparin.  ACT returned to 30 and 3000 additional units were given.  We used a quick cross catheter and V 18 wire to cross into the peroneal initially.  1.5 classic diamondback atherectomy was used from the popliteal into the tibioperoneal trunk and peroneal arteries.  We then dilated all these vessels with 3 mm balloon inflated to nominal pressure for 2 minutes.  Angiogram demonstrated resolution of the stenosis of the peroneal although there was some residual in the tibioperoneal trunk and popliteal.  We then used a clear cross catheter to select the posterior tibial artery again confirmed intraluminal access.  1.5 classic atherectomy was used on the posterior tibial artery.  Again with 3 mm  balloon used to dilate this across into the tibioperoneal trunk.  Completion angiogram demonstrated no residual stenosis in the posterior tibial artery.  A 4 mm drug-coated balloon was then deployed in the tibioperoneal trunk and popliteal arteries for 3 minutes at nominal pressure.   Completion demonstrated no residual stenosis in any of our vessels and no dissection was present.  There was brisk flow through the posterior tibial filling the arch of the foot and the peroneal did give way to the DP at the foot.  Satisfied with this we exchanged over a long wire for short 6 [place] sheath.  Minx device close the left common femoral artery.  He tolerated all this well without immediate complication.   Contrast: 45cc  Marybeth Dandy C. Donzetta Matters, MD [facility] Office: [phone] Pager: [phone]

## 2018-06-27 NOTE — Progress Notes (Signed)
Dr. Donzetta Matters came by to see patient . Will extend bedrest to 1630 .  Left groin remains soft, dressing CDI.

## 2018-06-27 NOTE — Progress Notes (Signed)
Notified Dr. Donzetta Matters of patient having left groin ooze/ bloody drainage. Pressure held for 15 minutes and new dressing applied.  Site now C+D.  Dopplered pulses left post tib.  Per Dr. Donzetta Matters will add 2 more hours to bedrest time.

## 2018-06-27 NOTE — Progress Notes (Signed)
Bleeding noted to left groin area above incision site with swelling noted. Pressure being held.

## 2018-06-27 NOTE — Telephone Encounter (Signed)
sch appt lvm mld ltr 08/09/2018 1pm ABI 2pm RLE 215pm f/u MD

## 2018-06-27 NOTE — Progress Notes (Addendum)
Continue to hold pressure to left groin. Dr Donzetta Matters into see pt.

## 2018-06-27 NOTE — Progress Notes (Signed)
Patient left groin site oozing. Pressure being held 15 min, new dressing applied.

## 2018-06-28 ENCOUNTER — Encounter (HOSPITAL_COMMUNITY): Payer: Self-pay | Admitting: Vascular Surgery

## 2018-07-02 ENCOUNTER — Other Ambulatory Visit: Payer: Self-pay | Admitting: Family Medicine

## 2018-07-02 DIAGNOSIS — Z7951 Long term (current) use of inhaled steroids: Secondary | ICD-10-CM | POA: Diagnosis not present

## 2018-07-02 DIAGNOSIS — Z794 Long term (current) use of insulin: Secondary | ICD-10-CM | POA: Diagnosis not present

## 2018-07-02 DIAGNOSIS — Z9181 History of falling: Secondary | ICD-10-CM | POA: Diagnosis not present

## 2018-07-02 DIAGNOSIS — Z7982 Long term (current) use of aspirin: Secondary | ICD-10-CM | POA: Diagnosis not present

## 2018-07-02 DIAGNOSIS — E1140 Type 2 diabetes mellitus with diabetic neuropathy, unspecified: Secondary | ICD-10-CM | POA: Diagnosis not present

## 2018-07-02 DIAGNOSIS — I129 Hypertensive chronic kidney disease with stage 1 through stage 4 chronic kidney disease, or unspecified chronic kidney disease: Secondary | ICD-10-CM | POA: Diagnosis not present

## 2018-07-02 DIAGNOSIS — E11621 Type 2 diabetes mellitus with foot ulcer: Secondary | ICD-10-CM | POA: Diagnosis not present

## 2018-07-02 DIAGNOSIS — L97513 Non-pressure chronic ulcer of other part of right foot with necrosis of muscle: Secondary | ICD-10-CM | POA: Diagnosis not present

## 2018-07-02 DIAGNOSIS — N183 Chronic kidney disease, stage 3 (moderate): Secondary | ICD-10-CM | POA: Diagnosis not present

## 2018-07-05 ENCOUNTER — Ambulatory Visit: Payer: Medicare HMO | Admitting: Family Medicine

## 2018-07-10 DIAGNOSIS — Z9181 History of falling: Secondary | ICD-10-CM | POA: Diagnosis not present

## 2018-07-10 DIAGNOSIS — Z7951 Long term (current) use of inhaled steroids: Secondary | ICD-10-CM | POA: Diagnosis not present

## 2018-07-10 DIAGNOSIS — E1140 Type 2 diabetes mellitus with diabetic neuropathy, unspecified: Secondary | ICD-10-CM | POA: Diagnosis not present

## 2018-07-10 DIAGNOSIS — Z794 Long term (current) use of insulin: Secondary | ICD-10-CM | POA: Diagnosis not present

## 2018-07-10 DIAGNOSIS — N183 Chronic kidney disease, stage 3 (moderate): Secondary | ICD-10-CM | POA: Diagnosis not present

## 2018-07-10 DIAGNOSIS — I129 Hypertensive chronic kidney disease with stage 1 through stage 4 chronic kidney disease, or unspecified chronic kidney disease: Secondary | ICD-10-CM | POA: Diagnosis not present

## 2018-07-10 DIAGNOSIS — E11621 Type 2 diabetes mellitus with foot ulcer: Secondary | ICD-10-CM | POA: Diagnosis not present

## 2018-07-10 DIAGNOSIS — Z7982 Long term (current) use of aspirin: Secondary | ICD-10-CM | POA: Diagnosis not present

## 2018-07-10 DIAGNOSIS — L97513 Non-pressure chronic ulcer of other part of right foot with necrosis of muscle: Secondary | ICD-10-CM | POA: Diagnosis not present

## 2018-07-15 ENCOUNTER — Telehealth: Payer: Self-pay | Admitting: *Deleted

## 2018-07-15 NOTE — Telephone Encounter (Signed)
Received Physician Orders from [facility]; forwarded to provider/SLS 02/10

## 2018-07-17 DIAGNOSIS — Z794 Long term (current) use of insulin: Secondary | ICD-10-CM | POA: Diagnosis not present

## 2018-07-17 DIAGNOSIS — Z7951 Long term (current) use of inhaled steroids: Secondary | ICD-10-CM | POA: Diagnosis not present

## 2018-07-17 DIAGNOSIS — I129 Hypertensive chronic kidney disease with stage 1 through stage 4 chronic kidney disease, or unspecified chronic kidney disease: Secondary | ICD-10-CM | POA: Diagnosis not present

## 2018-07-17 DIAGNOSIS — Z9181 History of falling: Secondary | ICD-10-CM | POA: Diagnosis not present

## 2018-07-17 DIAGNOSIS — E1140 Type 2 diabetes mellitus with diabetic neuropathy, unspecified: Secondary | ICD-10-CM | POA: Diagnosis not present

## 2018-07-17 DIAGNOSIS — N183 Chronic kidney disease, stage 3 (moderate): Secondary | ICD-10-CM | POA: Diagnosis not present

## 2018-07-17 DIAGNOSIS — Z7982 Long term (current) use of aspirin: Secondary | ICD-10-CM | POA: Diagnosis not present

## 2018-07-17 DIAGNOSIS — E11621 Type 2 diabetes mellitus with foot ulcer: Secondary | ICD-10-CM | POA: Diagnosis not present

## 2018-07-17 DIAGNOSIS — L97513 Non-pressure chronic ulcer of other part of right foot with necrosis of muscle: Secondary | ICD-10-CM | POA: Diagnosis not present

## 2018-07-23 ENCOUNTER — Telehealth: Payer: Self-pay

## 2018-07-23 DIAGNOSIS — N289 Disorder of kidney and ureter, unspecified: Secondary | ICD-10-CM

## 2018-07-23 DIAGNOSIS — N179 Acute kidney failure, unspecified: Secondary | ICD-10-CM

## 2018-07-23 NOTE — Telephone Encounter (Signed)
Copied from [place] [id number]. Topic: Referral - Request for Referral >> Jul 17, 2018  2:55 PM Reyne Dumas L wrote: Has patient seen PCP for this complaint?  No *If NO, is insurance requiring patient see PCP for this issue before PCP can refer them? Referral for which specialty: renal doctor Preferred provider/office: Dr. Murtis Sink, [facility] - phone [phone] Reason for referral: Pt was in the hospital in October in renal failure and was told to follow up with a renal doctor but did not want to do so at that time.  Pt's wife is now requesting referral.  Wife, Jana Half, can be be reached at [phone] or [phone]

## 2018-07-26 DIAGNOSIS — Z794 Long term (current) use of insulin: Secondary | ICD-10-CM | POA: Diagnosis not present

## 2018-07-26 DIAGNOSIS — I129 Hypertensive chronic kidney disease with stage 1 through stage 4 chronic kidney disease, or unspecified chronic kidney disease: Secondary | ICD-10-CM | POA: Diagnosis not present

## 2018-07-26 DIAGNOSIS — E11621 Type 2 diabetes mellitus with foot ulcer: Secondary | ICD-10-CM | POA: Diagnosis not present

## 2018-07-26 DIAGNOSIS — N183 Chronic kidney disease, stage 3 (moderate): Secondary | ICD-10-CM | POA: Diagnosis not present

## 2018-07-26 DIAGNOSIS — Z9181 History of falling: Secondary | ICD-10-CM | POA: Diagnosis not present

## 2018-07-26 DIAGNOSIS — Z7951 Long term (current) use of inhaled steroids: Secondary | ICD-10-CM | POA: Diagnosis not present

## 2018-07-26 DIAGNOSIS — Z7982 Long term (current) use of aspirin: Secondary | ICD-10-CM | POA: Diagnosis not present

## 2018-07-26 DIAGNOSIS — L97513 Non-pressure chronic ulcer of other part of right foot with necrosis of muscle: Secondary | ICD-10-CM | POA: Diagnosis not present

## 2018-07-26 DIAGNOSIS — E1140 Type 2 diabetes mellitus with diabetic neuropathy, unspecified: Secondary | ICD-10-CM | POA: Diagnosis not present

## 2018-08-01 ENCOUNTER — Telehealth: Payer: Self-pay | Admitting: *Deleted

## 2018-08-01 NOTE — Telephone Encounter (Signed)
Received Home Health Discharge-Transfer Summary for review from [facility]; forwarded to provider/SLS 02/27

## 2018-08-02 ENCOUNTER — Other Ambulatory Visit: Payer: Self-pay

## 2018-08-02 DIAGNOSIS — I739 Peripheral vascular disease, unspecified: Secondary | ICD-10-CM

## 2018-08-09 ENCOUNTER — Ambulatory Visit (INDEPENDENT_AMBULATORY_CARE_PROVIDER_SITE_OTHER)
Admission: RE | Admit: 2018-08-09 | Discharge: 2018-08-09 | Disposition: A | Discharge status: Home / Self Care | Payer: Medicare HMO | Source: Ambulatory Visit | Attending: Vascular Surgery | Admitting: Vascular Surgery

## 2018-08-09 ENCOUNTER — Ambulatory Visit (HOSPITAL_COMMUNITY)
Admission: RE | Admit: 2018-08-09 | Discharge: 2018-08-09 | Disposition: A | Discharge status: Home / Self Care | Payer: Medicare HMO | Source: Ambulatory Visit | Attending: Family | Admitting: Family

## 2018-08-09 ENCOUNTER — Ambulatory Visit: Payer: Medicare HMO | Admitting: Vascular Surgery

## 2018-08-09 DIAGNOSIS — I739 Peripheral vascular disease, unspecified: Secondary | ICD-10-CM | POA: Insufficient documentation

## 2018-08-16 ENCOUNTER — Ambulatory Visit: Payer: Medicare HMO | Admitting: Vascular Surgery

## 2018-08-16 ENCOUNTER — Encounter: Payer: Self-pay | Admitting: Family

## 2018-10-23 ENCOUNTER — Other Ambulatory Visit: Payer: Self-pay | Admitting: Family Medicine

## 2018-11-15 ENCOUNTER — Telehealth: Payer: Self-pay

## 2018-11-15 NOTE — Telephone Encounter (Signed)
LOV 02/11/18. Per Dr. Loanne Drilling, f/u in 2 mo. Called pt to schedule appt. LVM requesting returned call.

## 2018-11-30 ENCOUNTER — Other Ambulatory Visit: Payer: Self-pay

## 2018-11-30 ENCOUNTER — Emergency Department (HOSPITAL_BASED_OUTPATIENT_CLINIC_OR_DEPARTMENT_OTHER): Payer: Medicare HMO

## 2018-11-30 ENCOUNTER — Inpatient Hospital Stay (HOSPITAL_BASED_OUTPATIENT_CLINIC_OR_DEPARTMENT_OTHER)
Admission: EM | Admit: 2018-11-30 | Discharge: 2018-12-03 | DRG: 291 | Disposition: A | Discharge status: Home Health | Payer: Medicare HMO | Attending: Internal Medicine | Admitting: Internal Medicine

## 2018-11-30 ENCOUNTER — Encounter (HOSPITAL_BASED_OUTPATIENT_CLINIC_OR_DEPARTMENT_OTHER): Payer: Self-pay | Admitting: Emergency Medicine

## 2018-11-30 ENCOUNTER — Observation Stay (HOSPITAL_BASED_OUTPATIENT_CLINIC_OR_DEPARTMENT_OTHER): Payer: Medicare HMO

## 2018-11-30 DIAGNOSIS — E1122 Type 2 diabetes mellitus with diabetic chronic kidney disease: Secondary | ICD-10-CM | POA: Diagnosis present

## 2018-11-30 DIAGNOSIS — E1140 Type 2 diabetes mellitus with diabetic neuropathy, unspecified: Secondary | ICD-10-CM | POA: Diagnosis present

## 2018-11-30 DIAGNOSIS — R7989 Other specified abnormal findings of blood chemistry: Secondary | ICD-10-CM | POA: Diagnosis not present

## 2018-11-30 DIAGNOSIS — E782 Mixed hyperlipidemia: Secondary | ICD-10-CM | POA: Diagnosis present

## 2018-11-30 DIAGNOSIS — I10 Essential (primary) hypertension: Secondary | ICD-10-CM | POA: Diagnosis present

## 2018-11-30 DIAGNOSIS — R0602 Shortness of breath: Secondary | ICD-10-CM | POA: Diagnosis not present

## 2018-11-30 DIAGNOSIS — D638 Anemia in other chronic diseases classified elsewhere: Secondary | ICD-10-CM | POA: Diagnosis present

## 2018-11-30 DIAGNOSIS — J9601 Acute respiratory failure with hypoxia: Secondary | ICD-10-CM | POA: Diagnosis not present

## 2018-11-30 DIAGNOSIS — Z7902 Long term (current) use of antithrombotics/antiplatelets: Secondary | ICD-10-CM

## 2018-11-30 DIAGNOSIS — I5032 Chronic diastolic (congestive) heart failure: Secondary | ICD-10-CM

## 2018-11-30 DIAGNOSIS — N184 Chronic kidney disease, stage 4 (severe): Secondary | ICD-10-CM | POA: Diagnosis present

## 2018-11-30 DIAGNOSIS — Z7982 Long term (current) use of aspirin: Secondary | ICD-10-CM | POA: Diagnosis not present

## 2018-11-30 DIAGNOSIS — N189 Chronic kidney disease, unspecified: Secondary | ICD-10-CM | POA: Diagnosis not present

## 2018-11-30 DIAGNOSIS — I5033 Acute on chronic diastolic (congestive) heart failure: Secondary | ICD-10-CM | POA: Diagnosis not present

## 2018-11-30 DIAGNOSIS — Z794 Long term (current) use of insulin: Secondary | ICD-10-CM | POA: Diagnosis not present

## 2018-11-30 DIAGNOSIS — Z79899 Other long term (current) drug therapy: Secondary | ICD-10-CM | POA: Diagnosis not present

## 2018-11-30 DIAGNOSIS — I130 Hypertensive heart and chronic kidney disease with heart failure and stage 1 through stage 4 chronic kidney disease, or unspecified chronic kidney disease: Secondary | ICD-10-CM | POA: Diagnosis not present

## 2018-11-30 DIAGNOSIS — I509 Heart failure, unspecified: Secondary | ICD-10-CM

## 2018-11-30 DIAGNOSIS — I110 Hypertensive heart disease with heart failure: Secondary | ICD-10-CM | POA: Diagnosis not present

## 2018-11-30 DIAGNOSIS — Z833 Family history of diabetes mellitus: Secondary | ICD-10-CM

## 2018-11-30 DIAGNOSIS — E1165 Type 2 diabetes mellitus with hyperglycemia: Secondary | ICD-10-CM | POA: Diagnosis not present

## 2018-11-30 DIAGNOSIS — E1151 Type 2 diabetes mellitus with diabetic peripheral angiopathy without gangrene: Secondary | ICD-10-CM | POA: Diagnosis present

## 2018-11-30 DIAGNOSIS — N179 Acute kidney failure, unspecified: Secondary | ICD-10-CM | POA: Diagnosis not present

## 2018-11-30 DIAGNOSIS — Z87891 Personal history of nicotine dependence: Secondary | ICD-10-CM

## 2018-11-30 DIAGNOSIS — R778 Other specified abnormalities of plasma proteins: Secondary | ICD-10-CM | POA: Diagnosis present

## 2018-11-30 DIAGNOSIS — I5031 Acute diastolic (congestive) heart failure: Secondary | ICD-10-CM

## 2018-11-30 DIAGNOSIS — Z8249 Family history of ischemic heart disease and other diseases of the circulatory system: Secondary | ICD-10-CM | POA: Diagnosis not present

## 2018-11-30 DIAGNOSIS — J9602 Acute respiratory failure with hypercapnia: Secondary | ICD-10-CM | POA: Diagnosis present

## 2018-11-30 DIAGNOSIS — I248 Other forms of acute ischemic heart disease: Secondary | ICD-10-CM | POA: Diagnosis not present

## 2018-11-30 DIAGNOSIS — Z1159 Encounter for screening for other viral diseases: Secondary | ICD-10-CM | POA: Diagnosis not present

## 2018-11-30 DIAGNOSIS — R9431 Abnormal electrocardiogram [ECG] [EKG]: Secondary | ICD-10-CM | POA: Diagnosis present

## 2018-11-30 DIAGNOSIS — D649 Anemia, unspecified: Secondary | ICD-10-CM | POA: Diagnosis not present

## 2018-11-30 LAB — URINALYSIS, ROUTINE W REFLEX MICROSCOPIC
Bilirubin Urine: NEGATIVE
Glucose, UA: 50 mg/dL — AB
Ketones, ur: NEGATIVE mg/dL
Nitrite: NEGATIVE
Protein, ur: 30 mg/dL — AB
Specific Gravity, Urine: 1.006 (ref 1.005–1.030)
pH: 5 (ref 5.0–8.0)

## 2018-11-30 LAB — ECHOCARDIOGRAM COMPLETE
Height: 69 in
Weight: 2848 oz

## 2018-11-30 LAB — COMPREHENSIVE METABOLIC PANEL
ALT: 9 U/L (ref 0–44)
AST: 16 U/L (ref 15–41)
Albumin: 3.2 g/dL — ABNORMAL LOW (ref 3.5–5.0)
Alkaline Phosphatase: 68 U/L (ref 38–126)
Anion gap: 13 (ref 5–15)
BUN: 43 mg/dL — ABNORMAL HIGH (ref 8–23)
CO2: 16 mmol/L — ABNORMAL LOW (ref 22–32)
Calcium: 8.5 mg/dL — ABNORMAL LOW (ref 8.9–10.3)
Chloride: 111 mmol/L (ref 98–111)
Creatinine, Ser: 3.03 mg/dL — ABNORMAL HIGH (ref 0.61–1.24)
GFR calc Af Amer: 22 mL/min — ABNORMAL LOW (ref >60)
GFR calc non Af Amer: 19 mL/min — ABNORMAL LOW (ref >60)
Glucose, Bld: 252 mg/dL — ABNORMAL HIGH (ref 70–99)
Potassium: 4.1 mmol/L (ref 3.5–5.1)
Sodium: 140 mmol/L (ref 135–145)
Total Bilirubin: 0.9 mg/dL (ref 0.3–1.2)
Total Protein: 7.6 g/dL (ref 6.5–8.1)

## 2018-11-30 LAB — SARS CORONAVIRUS 2 AG (30 MIN TAT): SARS Coronavirus 2 Ag: NEGATIVE

## 2018-11-30 LAB — CBC WITH DIFFERENTIAL/PLATELET
Abs Immature Granulocytes: 0.02 10*3/uL (ref 0.00–0.07)
Basophils Absolute: 0 10*3/uL (ref 0.0–0.1)
Basophils Relative: 1 %
Eosinophils Absolute: 0.2 10*3/uL (ref 0.0–0.5)
Eosinophils Relative: 3 %
HCT: 32.9 % — ABNORMAL LOW (ref 39.0–52.0)
Hemoglobin: 10.6 g/dL — ABNORMAL LOW (ref 13.0–17.0)
Immature Granulocytes: 0 %
Lymphocytes Relative: 13 %
Lymphs Abs: 1.1 10*3/uL (ref 0.7–4.0)
MCH: 26.7 pg (ref 26.0–34.0)
MCHC: 32.2 g/dL (ref 30.0–36.0)
MCV: 82.9 fL (ref 80.0–100.0)
Monocytes Absolute: 0.8 10*3/uL (ref 0.1–1.0)
Monocytes Relative: 9 %
Neutro Abs: 6.4 10*3/uL (ref 1.7–7.7)
Neutrophils Relative %: 74 %
Platelets: 273 10*3/uL (ref 150–400)
RBC: 3.97 MIL/uL — ABNORMAL LOW (ref 4.22–5.81)
RDW: 13.7 % (ref 11.5–15.5)
WBC: 8.5 10*3/uL (ref 4.0–10.5)
nRBC: 0 % (ref 0.0–0.2)

## 2018-11-30 LAB — HEMOGLOBIN A1C
Hgb A1c MFr Bld: 9.5 % — ABNORMAL HIGH (ref 4.8–5.6)
Mean Plasma Glucose: 225.95 mg/dL

## 2018-11-30 LAB — GLUCOSE, CAPILLARY
Glucose-Capillary: 277 mg/dL — ABNORMAL HIGH (ref 70–99)
Glucose-Capillary: 292 mg/dL — ABNORMAL HIGH (ref 70–99)
Glucose-Capillary: 219 mg/dL — ABNORMAL HIGH (ref 70–99)

## 2018-11-30 LAB — BRAIN NATRIURETIC PEPTIDE: B Natriuretic Peptide: 1206.7 pg/mL — ABNORMAL HIGH (ref 0.0–100.0)

## 2018-11-30 LAB — CBG MONITORING, ED: Glucose-Capillary: 268 mg/dL — ABNORMAL HIGH (ref 70–99)

## 2018-11-30 LAB — TROPONIN I (HIGH SENSITIVITY)
Troponin I (High Sensitivity): 123 ng/L (ref <18)
Troponin I (High Sensitivity): 108 ng/L (ref <18)
Troponin I (High Sensitivity): 115 ng/L (ref <18)

## 2018-11-30 IMAGING — DX PORTABLE CHEST - 1 VIEW
1 series · 1 of 1 positions shown · non-contrast
Comparison: Chest radiograph and chest CT [DATE]

CLINICAL DATA: Shortness of breath

EXAM:
PORTABLE CHEST 1 VIEW

[chest ap]
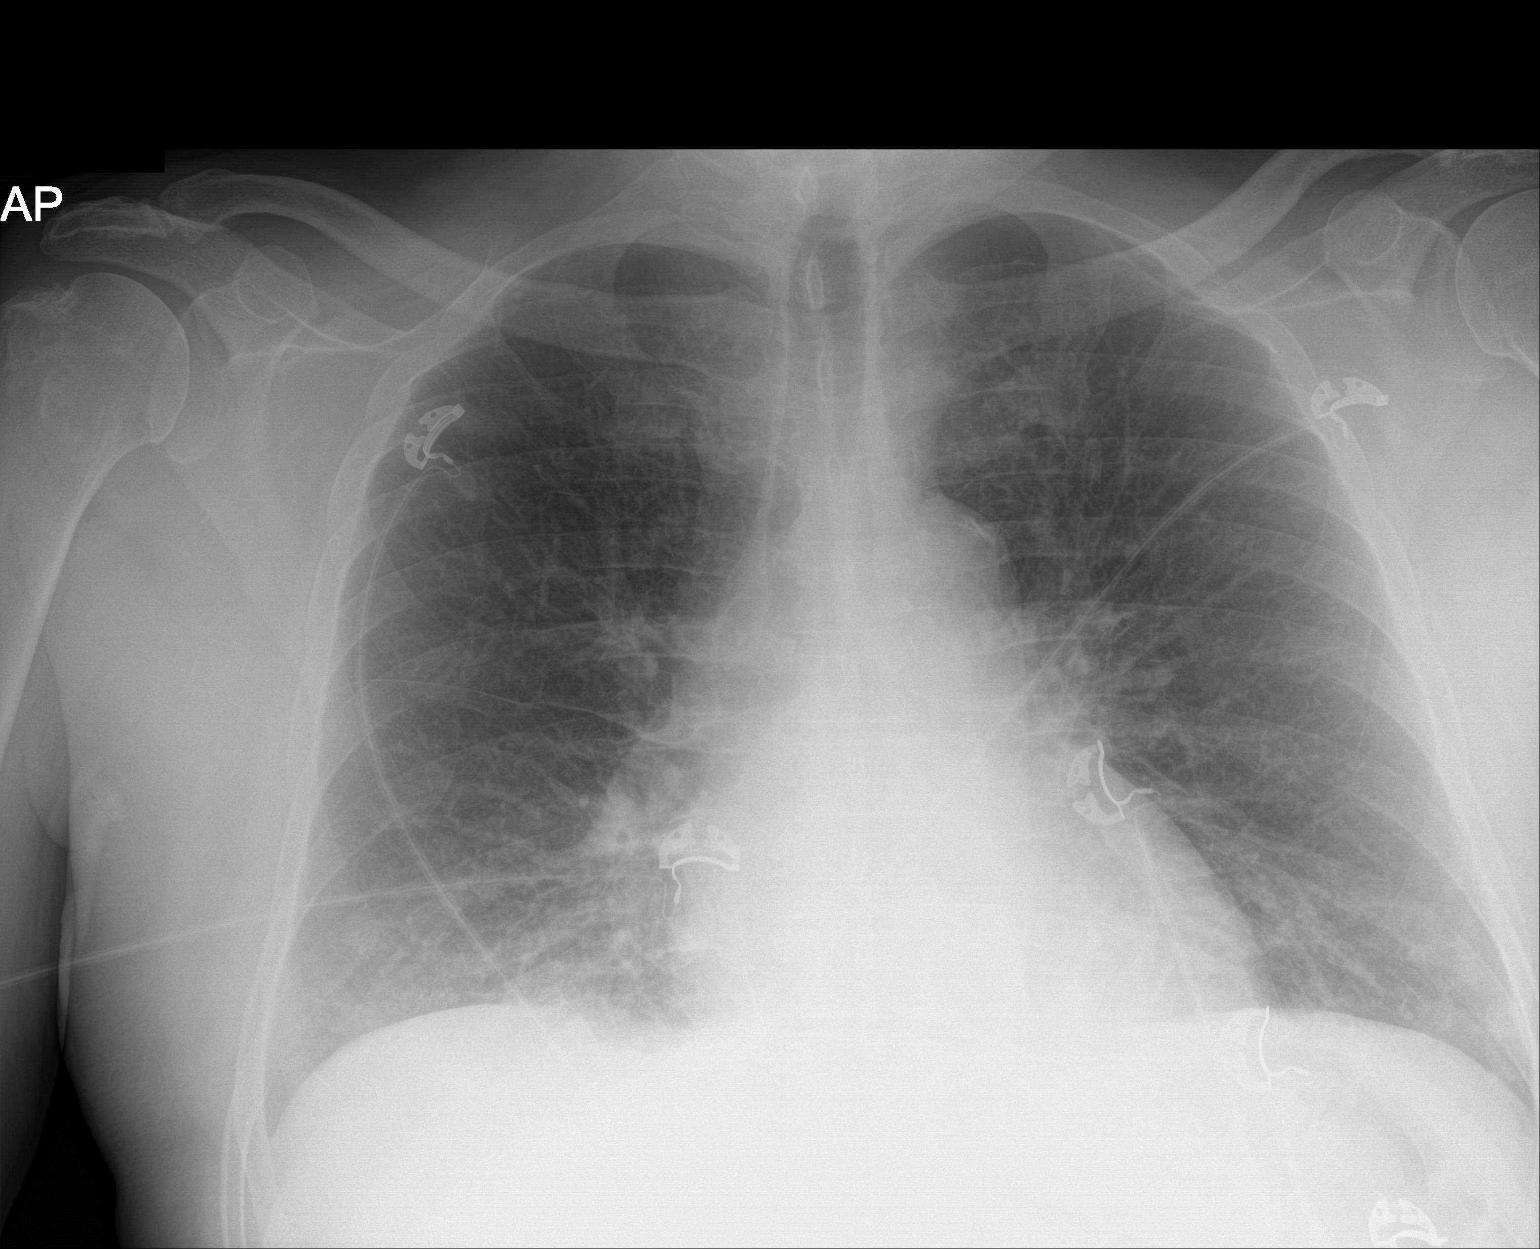

[1 of 1 positions shown; findings below may reference images not displayed]

FINDINGS: There Is no appreciable edema or consolidation. Heart is upper
normal in size with pulmonary vascularity normal. No adenopathy.
There is aortic atherosclerosis. No bone lesions.
IMPRESSION: No edema or consolidation. Heart upper normal in size. Aortic
Atherosclerosis ([MC]-[MC]).

## 2018-11-30 MED ORDER — INSULIN ASPART PROT & ASPART (70-30 MIX) 100 UNIT/ML ~~LOC~~ SUSP
10.0000 [IU] | Freq: Every day | SUBCUTANEOUS | Status: DC
  Administered 2018-11-30 – 2018-12-02 (×3): 10 [IU] via SUBCUTANEOUS

## 2018-11-30 MED ORDER — HEPARIN SODIUM (PORCINE) 5000 UNIT/ML IJ SOLN
5000.0000 [IU] | Freq: Three times a day (TID) | INTRAMUSCULAR | Status: DC
  Administered 2018-11-30 – 2018-12-03 (×7): 5000 [IU] via SUBCUTANEOUS
  Filled 2018-11-30 (×7): qty 1

## 2018-11-30 MED ORDER — SODIUM CHLORIDE 0.9% FLUSH: 3.0000 mL | INTRAVENOUS | Status: DC | PRN

## 2018-11-30 MED ORDER — ACETAMINOPHEN 325 MG PO TABS: 650.0000 mg | ORAL_TABLET | ORAL | Status: DC | PRN

## 2018-11-30 MED ORDER — ONDANSETRON HCL 4 MG/2ML IJ SOLN: 4.0000 mg | Freq: Four times a day (QID) | INTRAMUSCULAR | Status: DC | PRN

## 2018-11-30 MED ORDER — CLOPIDOGREL BISULFATE 75 MG PO TABS: 75.0000 mg | ORAL_TABLET | Freq: Every day | ORAL | Status: DC

## 2018-11-30 MED ORDER — INSULIN ASPART PROT & ASPART (70-30 MIX) 100 UNIT/ML ~~LOC~~ SUSP
20.0000 [IU] | Freq: Every day | SUBCUTANEOUS | Status: DC
  Filled 2018-11-30: qty 10

## 2018-11-30 MED ORDER — ATORVASTATIN CALCIUM 80 MG PO TABS
80.0000 mg | ORAL_TABLET | Freq: Every day | ORAL | Status: DC
  Administered 2018-12-01 – 2018-12-03 (×3): 80 mg via ORAL
  Filled 2018-11-30 (×3): qty 1

## 2018-11-30 MED ORDER — AMLODIPINE BESYLATE 10 MG PO TABS
10.0000 mg | ORAL_TABLET | Freq: Every day | ORAL | Status: DC
  Administered 2018-11-30 – 2018-12-03 (×4): 10 mg via ORAL
  Filled 2018-11-30 (×4): qty 1

## 2018-11-30 MED ORDER — ASPIRIN EC 81 MG PO TBEC
81.0000 mg | DELAYED_RELEASE_TABLET | Freq: Every day | ORAL | Status: DC
  Administered 2018-11-30 – 2018-12-03 (×4): 81 mg via ORAL
  Filled 2018-11-30 (×4): qty 1

## 2018-11-30 MED ORDER — ASPIRIN 81 MG PO TBEC: 81.0000 mg | DELAYED_RELEASE_TABLET | Freq: Every day | ORAL | Status: DC

## 2018-11-30 MED ORDER — FUROSEMIDE 10 MG/ML IJ SOLN
40.0000 mg | INTRAMUSCULAR | Status: AC
  Administered 2018-11-30: 40 mg via INTRAVENOUS
  Filled 2018-11-30: qty 4

## 2018-11-30 MED ORDER — HYDRALAZINE HCL 20 MG/ML IJ SOLN: 10.0000 mg | INTRAMUSCULAR | Status: DC | PRN

## 2018-11-30 MED ORDER — SODIUM CHLORIDE 0.9% FLUSH
3.0000 mL | Freq: Two times a day (BID) | INTRAVENOUS | Status: DC
  Administered 2018-11-30 – 2018-12-03 (×7): 3 mL via INTRAVENOUS

## 2018-11-30 MED ORDER — INSULIN ASPART PROT & ASPART (70-30 MIX) 100 UNIT/ML ~~LOC~~ SUSP
30.0000 [IU] | Freq: Every day | SUBCUTANEOUS | Status: DC
  Administered 2018-12-01: 25 [IU] via SUBCUTANEOUS
  Administered 2018-12-02 – 2018-12-03 (×2): 30 [IU] via SUBCUTANEOUS
  Filled 2018-11-30: qty 10

## 2018-11-30 MED ORDER — SODIUM CHLORIDE 0.9 % IV SOLN: 250.0000 mL | INTRAVENOUS | Status: DC | PRN

## 2018-11-30 MED ORDER — FUROSEMIDE 10 MG/ML IJ SOLN
40.0000 mg | Freq: Two times a day (BID) | INTRAMUSCULAR | Status: DC
  Administered 2018-11-30 – 2018-12-02 (×4): 40 mg via INTRAVENOUS
  Filled 2018-11-30 (×4): qty 4

## 2018-11-30 MED ORDER — CARVEDILOL 25 MG PO TABS
25.0000 mg | ORAL_TABLET | Freq: Two times a day (BID) | ORAL | Status: DC
  Administered 2018-11-30 – 2018-12-03 (×6): 25 mg via ORAL
  Filled 2018-11-30 (×6): qty 1

## 2018-11-30 NOTE — Progress Notes (Signed)
*  PRELIMINARY RESULTS* Echocardiogram 2D Echocardiogram has been performed.  Leavy Cella 11/30/2018, 3:53 PM
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
## 2018-11-30 NOTE — Plan of Care (Signed)
  Problem: Safety: Goal: Ability to remain free from injury will improve Outcome: Progressing   

## 2018-11-30 NOTE — ED Triage Notes (Signed)
SOB x 2 week, worse today. Denies chest pain, cough "at times" no fever

## 2018-11-30 NOTE — Progress Notes (Signed)
CRITICAL VALUE ALERT  Critical Value:  Troponin 115 Date & Time Notied:  11/30/2018 5:20 pm Provider Notified: Paged MD

## 2018-11-30 NOTE — Plan of Care (Signed)
[facility] discussed with Dr. Rex Kras. Mr Jermaine Mitchell is a 78 y/o M with pmh of PAD, CKD, tobacco abuse; who presents with SOB, PND, and swelling.  Troponin 123, BNP 1206.7, BUN 43, creatinine 3.03 (baseline Cr 2).  Covid-19 screening was neg. CXR heart at the upper limits of normal but no edema or consolidation.  Patient was given 40 mg of Lasix.  Accepted to a cardiac telemetry bed here at [facility] as observation

## 2018-11-30 NOTE — Progress Notes (Signed)
Patient needed a repeat Covid test -  Transfer from [place].   Initial test was neg. Patient is asymptomatic (no cough, fever or high WBC).   Will retest here, but per policy- precautions were not needed.

## 2018-11-30 NOTE — ED Notes (Signed)
Contacted patient's wife to update on condition and transfer.

## 2018-11-30 NOTE — ED Notes (Signed)
Troponin 123, results given to ED MD and RN
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
## 2018-11-30 NOTE — ED Notes (Signed)
Spoke with wife to update on condition.

## 2018-11-30 NOTE — ED Notes (Addendum)
[facility] notified (Ruby) - patient ready for transport 
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
## 2018-11-30 NOTE — ED Notes (Signed)
Attempted IV x 2 to Left arm, unsuccessful, tol well

## 2018-12-01 DIAGNOSIS — E1122 Type 2 diabetes mellitus with diabetic chronic kidney disease: Secondary | ICD-10-CM | POA: Diagnosis present

## 2018-12-01 DIAGNOSIS — Z8249 Family history of ischemic heart disease and other diseases of the circulatory system: Secondary | ICD-10-CM | POA: Diagnosis not present

## 2018-12-01 DIAGNOSIS — I509 Heart failure, unspecified: Secondary | ICD-10-CM

## 2018-12-01 DIAGNOSIS — N179 Acute kidney failure, unspecified: Secondary | ICD-10-CM | POA: Diagnosis present

## 2018-12-01 DIAGNOSIS — R9431 Abnormal electrocardiogram [ECG] [EKG]: Secondary | ICD-10-CM | POA: Diagnosis present

## 2018-12-01 DIAGNOSIS — J9601 Acute respiratory failure with hypoxia: Secondary | ICD-10-CM | POA: Diagnosis present

## 2018-12-01 DIAGNOSIS — Z79899 Other long term (current) drug therapy: Secondary | ICD-10-CM | POA: Diagnosis not present

## 2018-12-01 DIAGNOSIS — J9602 Acute respiratory failure with hypercapnia: Secondary | ICD-10-CM

## 2018-12-01 DIAGNOSIS — D638 Anemia in other chronic diseases classified elsewhere: Secondary | ICD-10-CM | POA: Diagnosis present

## 2018-12-01 DIAGNOSIS — I5031 Acute diastolic (congestive) heart failure: Secondary | ICD-10-CM

## 2018-12-01 DIAGNOSIS — I130 Hypertensive heart and chronic kidney disease with heart failure and stage 1 through stage 4 chronic kidney disease, or unspecified chronic kidney disease: Secondary | ICD-10-CM | POA: Diagnosis present

## 2018-12-01 DIAGNOSIS — Z87891 Personal history of nicotine dependence: Secondary | ICD-10-CM | POA: Diagnosis not present

## 2018-12-01 DIAGNOSIS — I5033 Acute on chronic diastolic (congestive) heart failure: Secondary | ICD-10-CM | POA: Diagnosis present

## 2018-12-01 DIAGNOSIS — Z1159 Encounter for screening for other viral diseases: Secondary | ICD-10-CM | POA: Diagnosis not present

## 2018-12-01 DIAGNOSIS — I10 Essential (primary) hypertension: Secondary | ICD-10-CM | POA: Diagnosis not present

## 2018-12-01 DIAGNOSIS — E782 Mixed hyperlipidemia: Secondary | ICD-10-CM | POA: Diagnosis present

## 2018-12-01 DIAGNOSIS — Z7982 Long term (current) use of aspirin: Secondary | ICD-10-CM | POA: Diagnosis not present

## 2018-12-01 DIAGNOSIS — Z833 Family history of diabetes mellitus: Secondary | ICD-10-CM | POA: Diagnosis not present

## 2018-12-01 DIAGNOSIS — Z794 Long term (current) use of insulin: Secondary | ICD-10-CM | POA: Diagnosis not present

## 2018-12-01 DIAGNOSIS — N184 Chronic kidney disease, stage 4 (severe): Secondary | ICD-10-CM | POA: Diagnosis present

## 2018-12-01 DIAGNOSIS — R0602 Shortness of breath: Secondary | ICD-10-CM | POA: Diagnosis present

## 2018-12-01 DIAGNOSIS — E1151 Type 2 diabetes mellitus with diabetic peripheral angiopathy without gangrene: Secondary | ICD-10-CM | POA: Diagnosis present

## 2018-12-01 DIAGNOSIS — E1140 Type 2 diabetes mellitus with diabetic neuropathy, unspecified: Secondary | ICD-10-CM | POA: Diagnosis present

## 2018-12-01 DIAGNOSIS — I248 Other forms of acute ischemic heart disease: Secondary | ICD-10-CM | POA: Diagnosis present

## 2018-12-01 DIAGNOSIS — E1165 Type 2 diabetes mellitus with hyperglycemia: Secondary | ICD-10-CM | POA: Diagnosis present

## 2018-12-01 DIAGNOSIS — Z7902 Long term (current) use of antithrombotics/antiplatelets: Secondary | ICD-10-CM | POA: Diagnosis not present

## 2018-12-01 LAB — CBC WITH DIFFERENTIAL/PLATELET
Abs Immature Granulocytes: 0.02 10*3/uL (ref 0.00–0.07)
Basophils Absolute: 0 10*3/uL (ref 0.0–0.1)
Basophils Relative: 1 %
Eosinophils Absolute: 0.2 10*3/uL (ref 0.0–0.5)
Eosinophils Relative: 3 %
HCT: 28.5 % — ABNORMAL LOW (ref 39.0–52.0)
Hemoglobin: 9.4 g/dL — ABNORMAL LOW (ref 13.0–17.0)
Immature Granulocytes: 0 %
Lymphocytes Relative: 19 %
Lymphs Abs: 1.3 10*3/uL (ref 0.7–4.0)
MCH: 26.8 pg (ref 26.0–34.0)
MCHC: 33 g/dL (ref 30.0–36.0)
MCV: 81.2 fL (ref 80.0–100.0)
Monocytes Absolute: 0.7 10*3/uL (ref 0.1–1.0)
Monocytes Relative: 11 %
Neutro Abs: 4.4 10*3/uL (ref 1.7–7.7)
Neutrophils Relative %: 66 %
Platelets: 249 10*3/uL (ref 150–400)
RBC: 3.51 MIL/uL — ABNORMAL LOW (ref 4.22–5.81)
RDW: 13.7 % (ref 11.5–15.5)
WBC: 6.6 10*3/uL (ref 4.0–10.5)
nRBC: 0 % (ref 0.0–0.2)

## 2018-12-01 LAB — BASIC METABOLIC PANEL
Anion gap: 10 (ref 5–15)
BUN: 46 mg/dL — ABNORMAL HIGH (ref 8–23)
CO2: 19 mmol/L — ABNORMAL LOW (ref 22–32)
Calcium: 8.2 mg/dL — ABNORMAL LOW (ref 8.9–10.3)
Chloride: 110 mmol/L (ref 98–111)
Creatinine, Ser: 3.22 mg/dL — ABNORMAL HIGH (ref 0.61–1.24)
GFR calc Af Amer: 21 mL/min — ABNORMAL LOW (ref >60)
GFR calc non Af Amer: 18 mL/min — ABNORMAL LOW (ref >60)
Glucose, Bld: 194 mg/dL — ABNORMAL HIGH (ref 70–99)
Potassium: 3.9 mmol/L (ref 3.5–5.1)
Sodium: 139 mmol/L (ref 135–145)

## 2018-12-01 LAB — IRON AND TIBC
Iron: 28 ug/dL — ABNORMAL LOW (ref 45–182)
Saturation Ratios: 13 % — ABNORMAL LOW (ref 17.9–39.5)
TIBC: 211 ug/dL — ABNORMAL LOW (ref 250–450)
UIBC: 183 ug/dL

## 2018-12-01 LAB — FERRITIN: Ferritin: 605 ng/mL — ABNORMAL HIGH (ref 24–336)

## 2018-12-01 LAB — RETICULOCYTES
Immature Retic Fract: 13.7 % (ref 2.3–15.9)
RBC.: 3.71 MIL/uL — ABNORMAL LOW (ref 4.22–5.81)
Retic Count, Absolute: 56.4 10*3/uL (ref 19.0–186.0)
Retic Ct Pct: 1.5 % (ref 0.4–3.1)

## 2018-12-01 LAB — GLUCOSE, CAPILLARY
Glucose-Capillary: 142 mg/dL — ABNORMAL HIGH (ref 70–99)
Glucose-Capillary: 151 mg/dL — ABNORMAL HIGH (ref 70–99)
Glucose-Capillary: 155 mg/dL — ABNORMAL HIGH (ref 70–99)
Glucose-Capillary: 158 mg/dL — ABNORMAL HIGH (ref 70–99)
Glucose-Capillary: 88 mg/dL (ref 70–99)

## 2018-12-01 LAB — FOLATE: Folate: 12.9 ng/mL (ref >5.9)

## 2018-12-01 LAB — NOVEL CORONAVIRUS, NAA (HOSP ORDER, SEND-OUT TO REF LAB; TAT 18-24 HRS): SARS-CoV-2, NAA: NOT DETECTED

## 2018-12-01 LAB — VITAMIN B12: Vitamin B-12: 375 pg/mL (ref 180–914)

## 2018-12-01 LAB — MAGNESIUM: Magnesium: 2 mg/dL (ref 1.7–2.4)

## 2018-12-01 NOTE — Care Management Obs Status (Signed)
[place] NOTIFICATION   Patient Details  Name: Jermaine Mitchell MRN: [id number] Date of Birth: 03-03-1941   Medicare Observation Status Notification Given:  Yes    Carles Collet, RN 12/01/2018, 1:35 PM

## 2018-12-01 NOTE — Progress Notes (Signed)
PROGRESS NOTE    Jermaine Mitchell  CWC:[id number] DOB: 03/22/1941 DOA: 11/30/2018 PCP: Mosie Lukes, MD  Brief Narrative: Jermaine Mitchell is a 78 y.o. male with medical history significant of PAD, HLD, CKD stage III, remote history of tobacco abuse, and DM type II with neuropathy; who presents with complaints of dyspnea on exertion for 5 days associated with lower extremity edema, orthopnea. - In the emergency room labs noted a creatinine of 3.0, troponin of 123 unremarkable, he was started on diuretics for fluid overload   Assessment & Plan:   Acute diastolic CHF -Presented with dyspnea lower extremity edema, PND and orthopnea, mild hypoxia -2D echocardiogram noted EF of 60 to 65% and grade 1 diastolic dysfunction -Clinically improving with diuresis, continue IV Lasix --1.2 L so far, continue beta-blocker -Monitor I's/O, daily weights, creatinine closely  Elevated troponin -Flat trend, suspect demand ischemia in the setting of fluid overload, acute diastolic CHF -Echocardiogram with preserved wall motion, normal EF -Cardiology following, no plan for ischemic evaluation  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 -Baseline creatinine was 2.2 in 06/2018, admission creatinine was 3.0 -Suspect cardiorenal syndrome -Continue IV Lasix, monitor urine output and weights -Was referred to see a nephrologist in the past but has not seen one  Essential hypertension -Continue Coreg, improving with diuresis  Type 2 diabetes mellitus -Hemoglobin A1c is 9.5, continue insulin 70/30 and sliding scale  Peripheral vascular disease -Continue aspirin Plavix statin  Anemia of chronic disease -Hemoglobin slightly lower than baseline, check anemia panel  DVT prophylaxis: Heparin subcutaneous Code Status: Full code Family Communication: No family at bedside will update wife Disposition Plan: Home pending improvement in volume status and kidney function  Consultants:   Cardiology   Procedures:    Antimicrobials:    Subjective: -Feels much better, breathing improving, not back to baseline yet  Objective: Vitals:   12/01/18 0007 12/01/18 0523 12/01/18 0731 12/01/18 0829  BP: (!) 145/78 (!) 148/82 (!) 152/83   Pulse: 72 67 71   Resp: 18 18 16    Temp: 98 F (36.7 C) 98.5 F (36.9 C) 97.7 F (36.5 C)   TempSrc: Oral Oral Oral   SpO2: 100% 98% 98% 97%  Weight:  78.7 kg    Height:        Intake/Output Summary (Last 24 hours) at 12/01/2018 1057 Last data filed at 12/01/2018 0859 Gross per 24 hour  Intake 560 ml  Output 2138 ml  Net -1578 ml   Filed Weights   11/30/18 0850 12/01/18 0523  Weight: 80.7 kg 78.7 kg    Examination:  General exam: Elderly male, sitting up in bed, no distress, AAO x3 Respiratory system: Fine bibasilar crackles Cardiovascular system: S1 & S2 heard, RRR.   Gastrointestinal system: Abdomen is nondistended, soft and nontender.Normal bowel sounds heard. Central nervous system: Alert and oriented. No focal neurological deficits. Extremities: 1-2+ edema Skin: No rashes, lesions or ulcers Psychiatry: Judgement and insight appear normal. Mood & affect appropriate.     Data Reviewed:   CBC: Recent Labs  Lab 11/30/18 0915 12/01/18 0237  WBC 8.5 6.6  NEUTROABS 6.4 4.4  HGB 10.6* 9.4*  HCT 32.9* 28.5*  MCV 82.9 81.2  PLT 273 761   Basic Metabolic Panel: Recent Labs  Lab 11/30/18 0915 12/01/18 0237  NA 140 139  K 4.1 3.9  CL 111 110  CO2 16* 19*  GLUCOSE 252* 194*  BUN 43* 46*  CREATININE 3.03* 3.22*  CALCIUM 8.5* 8.2*  MG  --  2.0   GFR: Estimated Creatinine Clearance: 19.8 mL/min (A) (by C-G formula based on SCr of 3.22 mg/dL (H)). Liver Function Tests: Recent Labs  Lab 11/30/18 0915  AST 16  ALT 9  ALKPHOS 68  BILITOT 0.9  PROT 7.6  ALBUMIN 3.2*   No results for input(s): LIPASE, AMYLASE in the last 168 hours. No results for input(s): AMMONIA in the last 168 hours. Coagulation Profile: No results for input(s):  INR, PROTIME in the last 168 hours. Cardiac Enzymes: No results for input(s): CKTOTAL, CKMB, CKMBINDEX, TROPONINI in the last 168 hours. BNP (last 3 results) No results for input(s): PROBNP in the last 8760 hours. HbA1C: Recent Labs    11/30/18 1503  HGBA1C 9.5*   CBG: Recent Labs  Lab 11/30/18 1250 11/30/18 1458 11/30/18 1648 11/30/18 2045 12/01/18 0615  GLUCAP 268* 277* 292* 219* 155*   Lipid Profile: No results for input(s): CHOL, HDL, LDLCALC, TRIG, CHOLHDL, LDLDIRECT in the last 72 hours. Thyroid Function Tests: No results for input(s): TSH, T4TOTAL, FREET4, T3FREE, THYROIDAB in the last 72 hours. Anemia Panel: No results for input(s): VITAMINB12, FOLATE, FERRITIN, TIBC, IRON, RETICCTPCT in the last 72 hours. Urine analysis:    Component Value Date/Time   COLORURINE STRAW (A) 11/30/2018 [place] 11/30/2018 1854   LABSPEC 1.006 11/30/2018 1854   PHURINE 5.0 11/30/2018 1854   GLUCOSEU 50 (A) 11/30/2018 1854   GLUCOSEU 250 (A) 08/10/2016 1145   HGBUR SMALL (A) 11/30/2018 [place] 11/30/2018 [place] 11/30/2018 1854   PROTEINUR 30 (A) 11/30/2018 1854   UROBILINOGEN 0.2 08/10/2016 1145   NITRITE NEGATIVE 11/30/2018 1854   LEUKOCYTESUR TRACE (A) 11/30/2018 1854   Sepsis Labs: @LABRCNTIP (procalcitonin:4,lacticidven:4)  ) Recent Results (from the past 240 hour(s))  SARS Coronavirus 2 (Hosp order,Performed in [place] lab via [employer] ID)     Status: None   Collection Time: 11/30/18 10:12 AM   Specimen: Dry Nasal Swab (Abbott ID Now)  Result Value Ref Range Status   SARS Coronavirus 2 (Abbott ID Now) NEGATIVE NEGATIVE Final    Comment: (NOTE) Interpretive Result Comment(s): COVID 19 Positive SARS CoV 2 target nucleic acids are DETECTED. The SARS CoV 2 RNA is generally detectable in upper and lower respiratory specimens during the acute phase of infection.  Positive results are indicative of active infection  with SARS CoV 2.  Clinical correlation with patient history and other diagnostic information is necessary to determine patient infection status.  Positive results do not rule out bacterial infection or coinfection with other viruses. The expected result is Negative. COVID 19 Negative SARS CoV 2 target nucleic acids are NOT DETECTED. The SARS CoV 2 RNA is generally detectable in upper and lower respiratory specimens during the acute phase of infection.  Negative results do not preclude SARS CoV 2 infection, do not rule out coinfections with other pathogens, and should not be used as the sole basis for treatment or other patient management decisions.  Negative results must be combined with clinical  observations, patient history, and epidemiological information. The expected result is Negative. Invalid Presence or absence of SARS CoV 2 nucleic acids cannot be determined. Repeat testing was performed on the submitted specimen and repeated Invalid results were obtained.  If clinically indicated, additional testing on a new specimen with an alternate test methodology [phone]) is advised.  The SARS CoV 2 RNA is generally detectable in upper and lower respiratory specimens during the acute phase of infection. The  expected result is Negative. Fact Sheet for Patients:  GolfingFamily.no Fact Sheet for Healthcare Providers: https://www.hernandez-brewer.com/ This test is not yet approved or cleared by the [place] FDA and has been authorized for detection and/or diagnosis of SARS CoV 2 by FDA under an Emergency Use Authorization (EUA).  This EUA will remain in effect (meaning this test can be used) for the duration of the COVID19 d eclaration under Section 564(b)(1) of the Act, 21 U.S.C. section [phone] 3(b)(1), unless the authorization is terminated or revoked sooner. Performed at [facility], [address], [place], [place] [address]           Radiology Studies: Dg Chest [place] 1 View  Result Date: 11/30/2018 CLINICAL DATA:  Shortness of breath EXAM: PORTABLE CHEST 1 VIEW COMPARISON:  Chest radiograph and chest CT March 09, 2018 FINDINGS: There Is no appreciable edema or consolidation. Heart is upper normal in size with pulmonary vascularity normal. No adenopathy. There is aortic atherosclerosis. No bone lesions. IMPRESSION: No edema or consolidation. Heart upper normal in size. Aortic Atherosclerosis (ICD10-I70.0). Electronically Signed   By: Lowella Grip III M.D.   On: 11/30/2018 09:31        Scheduled Meds: . amLODipine  10 mg Oral Daily  . aspirin EC  81 mg Oral Daily  . atorvastatin  80 mg Oral Q breakfast  . carvedilol  25 mg Oral BID WC  . furosemide  40 mg Intravenous BID  . heparin  5,000 Units Subcutaneous Q8H  . insulin aspart protamine- aspart  10 Units Subcutaneous Q supper  . insulin aspart protamine- aspart  30 Units Subcutaneous Q breakfast  . sodium chloride flush  3 mL Intravenous Q12H   Continuous Infusions: . sodium chloride       LOS: 0 days    Time spent: 33min    Domenic Polite, MD [facility]  12/01/2018, 10:57 AM

## 2018-12-01 NOTE — Care Management (Signed)
Spoke to patient at bedside. He states that he lives at home with wife. Spoke w wife in room in speaker. She states that he has a cane, would like t have RW. PT eval is pending. They are interested in [facility] services. Wife states that they do not have a preference of [place]. CM will continue to follow.
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
## 2018-12-01 NOTE — Plan of Care (Signed)
  Problem: Education: Goal: Knowledge of General Education information will improve Description: Including pain rating scale, medication(s)/side effects and non-pharmacologic comfort measures Outcome: Progressing   Problem: Health Behavior/Discharge Planning: Goal: Ability to manage health-related needs will improve Outcome: Progressing   Problem: Coping: Goal: Level of anxiety will decrease Outcome: Completed/Met   Problem: Clinical Measurements: Goal: Ability to maintain clinical measurements within normal limits will improve Outcome: Progressing

## 2018-12-02 DIAGNOSIS — I5033 Acute on chronic diastolic (congestive) heart failure: Secondary | ICD-10-CM

## 2018-12-02 LAB — GLUCOSE, CAPILLARY
Glucose-Capillary: 303 mg/dL — ABNORMAL HIGH (ref 70–99)
Glucose-Capillary: 278 mg/dL — ABNORMAL HIGH (ref 70–99)
Glucose-Capillary: 204 mg/dL — ABNORMAL HIGH (ref 70–99)
Glucose-Capillary: 153 mg/dL — ABNORMAL HIGH (ref 70–99)

## 2018-12-02 LAB — BASIC METABOLIC PANEL
Anion gap: 10 (ref 5–15)
BUN: 47 mg/dL — ABNORMAL HIGH (ref 8–23)
CO2: 20 mmol/L — ABNORMAL LOW (ref 22–32)
Calcium: 8.1 mg/dL — ABNORMAL LOW (ref 8.9–10.3)
Chloride: 108 mmol/L (ref 98–111)
Creatinine, Ser: 3.08 mg/dL — ABNORMAL HIGH (ref 0.61–1.24)
GFR calc Af Amer: 22 mL/min — ABNORMAL LOW (ref >60)
GFR calc non Af Amer: 19 mL/min — ABNORMAL LOW (ref >60)
Glucose, Bld: 313 mg/dL — ABNORMAL HIGH (ref 70–99)
Potassium: 3.9 mmol/L (ref 3.5–5.1)
Sodium: 138 mmol/L (ref 135–145)

## 2018-12-02 LAB — CBC
HCT: 28.5 % — ABNORMAL LOW (ref 39.0–52.0)
Hemoglobin: 9.4 g/dL — ABNORMAL LOW (ref 13.0–17.0)
MCH: 26.8 pg (ref 26.0–34.0)
MCHC: 33 g/dL (ref 30.0–36.0)
MCV: 81.2 fL (ref 80.0–100.0)
Platelets: 231 10*3/uL (ref 150–400)
RBC: 3.51 MIL/uL — ABNORMAL LOW (ref 4.22–5.81)
RDW: 13.5 % (ref 11.5–15.5)
WBC: 6.3 10*3/uL (ref 4.0–10.5)
nRBC: 0 % (ref 0.0–0.2)

## 2018-12-02 MED ORDER — TORSEMIDE 20 MG PO TABS
40.0000 mg | ORAL_TABLET | Freq: Every day | ORAL | Status: DC
  Administered 2018-12-02 – 2018-12-03 (×2): 40 mg via ORAL
  Filled 2018-12-02 (×2): qty 2

## 2018-12-02 MED ORDER — ISOSORBIDE MONONITRATE ER 30 MG PO TB24
30.0000 mg | ORAL_TABLET | Freq: Every day | ORAL | Status: DC
  Administered 2018-12-02 – 2018-12-03 (×2): 30 mg via ORAL
  Filled 2018-12-02 (×2): qty 1

## 2018-12-02 MED ORDER — SODIUM CHLORIDE 0.9 % IV SOLN
510.0000 mg | Freq: Once | INTRAVENOUS | Status: AC
  Administered 2018-12-02: 510 mg via INTRAVENOUS
  Filled 2018-12-02: qty 17

## 2018-12-02 NOTE — Progress Notes (Signed)
PROGRESS NOTE    Jermaine Mitchell  IWL:[id number] DOB: 01/07/1941 DOA: 11/30/2018 PCP: Mosie Lukes, MD  Brief Narrative: Jermaine Mitchell is a 78 y.o. male with medical history significant of PAD, HLD, CKD stage III, remote history of tobacco abuse, and DM type II with neuropathy; who presents with complaints of dyspnea on exertion for 5 days associated with lower extremity edema, orthopnea. - In the emergency room labs noted a creatinine of 3.0, troponin of 123 unremarkable, he was started on diuretics for fluid overload   Assessment & Plan:   Acute diastolic CHF -Presented with dyspnea lower extremity edema, PND and orthopnea, mild hypoxia -2D echocardiogram noted EF of 60 to 65% and grade 1 diastolic dysfunction -Clinically improving with diuresis, negative 1.5L -appears close to euvolemic, transition to Oral diuretics today -Ambulate, PT, discharge planning  Elevated troponin -Flat trend, suspect demand ischemia in the setting of fluid overload, acute diastolic CHF -Echocardiogram with preserved wall motion, normal EF -Cardiology following  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 -Baseline creatinine was 2.2 in 06/2018, admission creatinine was 3.0 -Suspect cardiorenal syndrome -Was referred to see a nephrologist in the past but has not seen one, discussed with wife, she is rescheduling his appointment  Essential hypertension -Continue Coreg, improving with diuresis  Type 2 diabetes mellitus -Hemoglobin A1c is 9.5, continue insulin 70/30 and sliding scale  Peripheral vascular disease -Continue aspirin Plavix statin  Anemia of chronic disease -Hemoglobin slightly lower than baseline, anemia panel with mild iron deficiency, add iron at discharge, will give Feraheme x1 now  DVT prophylaxis: Heparin subcutaneous Code Status: Full code Family Communication: No family at bedside called and updated wife yesterday Disposition Plan: Home tomorrow if stable  Consultants:    Cardiology   Procedures:   Antimicrobials:    Subjective: -He is better, breathing improving, closer to baseline now  Objective: Vitals:   12/02/18 0130 12/02/18 0544 12/02/18 0654 12/02/18 0838  BP: (!) 145/85 (!) 158/89  (!) 144/81  Pulse: 73 75  72  Resp: 20 20    Temp: 97.9 F (36.6 C) 98.3 F (36.8 C)  98.6 F (37 C)  TempSrc: Oral Oral  Oral  SpO2: 100% 100%    Weight:   80.3 kg   Height:        Intake/Output Summary (Last 24 hours) at 12/02/2018 1100 Last data filed at 12/02/2018 0900 Gross per 24 hour  Intake 1622 ml  Output 1500 ml  Net 122 ml   Filed Weights   11/30/18 0850 12/01/18 0523 12/02/18 0654  Weight: 80.7 kg 78.7 kg 80.3 kg    Examination:  General exam: Elderly male, sitting up in bed, no distress, AAO x3 Respiratory system: Fine bibasilar crackles Cardiovascular system: S1 & S2 heard, RRR.   Gastrointestinal system: Abdomen is nondistended, soft and nontender.Normal bowel sounds heard. Central nervous system: Alert and oriented. No focal neurological deficits. Extremities: 1-2+ edema Skin: No rashes, lesions or ulcers Psychiatry: Judgement and insight appear normal. Mood & affect appropriate.     Data Reviewed:   CBC: Recent Labs  Lab 11/30/18 0915 12/01/18 0237 12/02/18 0541  WBC 8.5 6.6 6.3  NEUTROABS 6.4 4.4  --   HGB 10.6* 9.4* 9.4*  HCT 32.9* 28.5* 28.5*  MCV 82.9 81.2 81.2  PLT 273 249 174   Basic Metabolic Panel: Recent Labs  Lab 11/30/18 0915 12/01/18 0237 12/02/18 0541  NA 140 139 138  K 4.1 3.9 3.9  CL 111 110 108  CO2 16*  19* 20*  GLUCOSE 252* 194* 313*  BUN 43* 46* 47*  CREATININE 3.03* 3.22* 3.08*  CALCIUM 8.5* 8.2* 8.1*  MG  --  2.0  --    GFR: Estimated Creatinine Clearance: 20.7 mL/min (A) (by C-G formula based on SCr of 3.08 mg/dL (H)). Liver Function Tests: Recent Labs  Lab 11/30/18 0915  AST 16  ALT 9  ALKPHOS 68  BILITOT 0.9  PROT 7.6  ALBUMIN 3.2*   No results for input(s): LIPASE,  AMYLASE in the last 168 hours. No results for input(s): AMMONIA in the last 168 hours. Coagulation Profile: No results for input(s): INR, PROTIME in the last 168 hours. Cardiac Enzymes: No results for input(s): CKTOTAL, CKMB, CKMBINDEX, TROPONINI in the last 168 hours. BNP (last 3 results) No results for input(s): PROBNP in the last 8760 hours. HbA1C: Recent Labs    11/30/18 1503  HGBA1C 9.5*   CBG: Recent Labs  Lab 12/01/18 1201 12/01/18 1608 12/01/18 2114 12/01/18 2344 12/02/18 0610  GLUCAP 158* 142* 88 151* 303*   Lipid Profile: No results for input(s): CHOL, HDL, LDLCALC, TRIG, CHOLHDL, LDLDIRECT in the last 72 hours. Thyroid Function Tests: No results for input(s): TSH, T4TOTAL, FREET4, T3FREE, THYROIDAB in the last 72 hours. Anemia Panel: Recent Labs    12/01/18 1310  VITAMINB12 375  FOLATE 12.9  FERRITIN 605*  TIBC 211*  IRON 28*  RETICCTPCT 1.5   Urine analysis:    Component Value Date/Time   COLORURINE STRAW (A) 11/30/2018 1854   APPEARANCEUR CLEAR 11/30/2018 1854   LABSPEC 1.006 11/30/2018 1854   PHURINE 5.0 11/30/2018 1854   GLUCOSEU 50 (A) 11/30/2018 1854   GLUCOSEU 250 (A) 08/10/2016 1145   HGBUR SMALL (A) 11/30/2018 [place] 11/30/2018 [place] 11/30/2018 1854   PROTEINUR 30 (A) 11/30/2018 1854   UROBILINOGEN 0.2 08/10/2016 1145   NITRITE NEGATIVE 11/30/2018 1854   LEUKOCYTESUR TRACE (A) 11/30/2018 1854   Sepsis Labs: @LABRCNTIP (procalcitonin:4,lacticidven:4)  ) Recent Results (from the past 240 hour(s))  SARS Coronavirus 2 (Hosp order,Performed in [place] lab via [employer] ID)     Status: None   Collection Time: 11/30/18 10:12 AM   Specimen: Dry Nasal Swab ([employer] ID Now)  Result Value Ref Range Status   SARS Coronavirus 2 ([employer] Now) NEGATIVE NEGATIVE Final    Comment: (NOTE) Interpretive Result Comment(s): COVID 19 Positive SARS CoV 2 target nucleic acids are DETECTED. The SARS CoV 2 RNA is  generally detectable in upper and lower respiratory specimens during the acute phase of infection.  Positive results are indicative of active infection with SARS CoV 2.  Clinical correlation with patient history and other diagnostic information is necessary to determine patient infection status.  Positive results do not rule out bacterial infection or coinfection with other viruses. The expected result is Negative. COVID 19 Negative SARS CoV 2 target nucleic acids are NOT DETECTED. The SARS CoV 2 RNA is generally detectable in upper and lower respiratory specimens during the acute phase of infection.  Negative results do not preclude SARS CoV 2 infection, do not rule out coinfections with other pathogens, and should not be used as the sole basis for treatment or other patient management decisions.  Negative results must be combined with clinical  observations, patient history, and epidemiological information. The expected result is Negative. Invalid Presence or absence of SARS CoV 2 nucleic acids cannot be determined. Repeat testing was performed on the submitted specimen and repeated Invalid results  were obtained.  If clinically indicated, additional testing on a new specimen with an alternate test methodology [phone]) is advised.  The SARS CoV 2 RNA is generally detectable in upper and lower respiratory specimens during the acute phase of infection. The expected result is Negative. Fact Sheet for Patients:  GolfingFamily.no Fact Sheet for Healthcare Providers: https://www.hernandez-brewer.com/ This test is not yet approved or cleared by the [place] FDA and has been authorized for detection and/or diagnosis of SARS CoV 2 by FDA under an Emergency Use Authorization (EUA).  This EUA will remain in effect (meaning this test can be used) for the duration of the COVID19 d eclaration under Section 564(b)(1) of the Act, 21 U.S.C. section [phone]  3(b)(1), unless the authorization is terminated or revoked sooner. Performed at [facility] [address] [place], [place] [address]   Novel Coronavirus,NAA,(SEND-OUT TO REF LAB - TAT 24-48 hrs); Hosp Order     Status: None   Collection Time: 11/30/18  6:38 PM   Specimen: Nasopharyngeal Swab; Respiratory  Result Value Ref Range Status   SARS-CoV-2, NAA NOT DETECTED NOT DETECTED Final    Comment: (NOTE) Testing was performed using the cobas(R) SARS-CoV-2 test. This test was developed and its performance characteristics determined by [employer]. This test has not been FDA cleared or approved. This test has been authorized by FDA under an Emergency Use Authorization (EUA). This test is only authorized for the duration of time the declaration that circumstances exist justifying the authorization of the emergency use of in vitro diagnostic tests for detection of SARS-CoV-2 virus and/or diagnosis of COVID-19 infection under section 564(b)(1) of the Act, 21 U.S.C. 491PHX-5(A)(5), unless the authorization is terminated or revoked sooner. When diagnostic testing is negative, the possibility of a false negative result should be considered in the context of a patient's recent exposures and the presence of clinical signs and symptoms consistent with COVID-19. An individual without symptoms of COVID-19 and who is not shedding SARS-CoV-2 virus would expect to have  a negative (not detected) result in this assay. Performed At: [facility] [address] [place], [place] [id number] Rush Farmer MD PV:[id number]    [place]  Final    Comment: Performed at [facility], [place] [address] [place], [place] [address]         Radiology Studies: No results found.      Scheduled Meds: . amLODipine  10 mg Oral Daily  . aspirin EC  81 mg Oral Daily  . atorvastatin  80 mg Oral Q breakfast  . carvedilol  25 mg Oral BID WC  .  heparin  5,000 Units Subcutaneous Q8H  . insulin aspart protamine- aspart  10 Units Subcutaneous Q supper  . insulin aspart protamine- aspart  30 Units Subcutaneous Q breakfast  . isosorbide mononitrate  30 mg Oral Daily  . sodium chloride flush  3 mL Intravenous Q12H  . torsemide  40 mg Oral Daily   Continuous Infusions: . sodium chloride       LOS: 1 day    Time spent: 19min    Domenic Polite, MD [facility]  12/02/2018, 11:00 AM

## 2018-12-02 NOTE — Progress Notes (Addendum)
Progress Note  Patient Name: Jermaine Mitchell Date of Encounter: 12/02/2018  Primary Cardiologist: Marny Lowenstein, MD  Subjective   Feels he is breathing much better, may be back to baseline. Says wife cooks low-Na food.  Inpatient Medications    Scheduled Meds: . amLODipine  10 mg Oral Daily  . aspirin EC  81 mg Oral Daily  . atorvastatin  80 mg Oral Q breakfast  . carvedilol  25 mg Oral BID WC  . furosemide  40 mg Intravenous BID  . heparin  5,000 Units Subcutaneous Q8H  . insulin aspart protamine- aspart  10 Units Subcutaneous Q supper  . insulin aspart protamine- aspart  30 Units Subcutaneous Q breakfast  . sodium chloride flush  3 mL Intravenous Q12H   Continuous Infusions: . sodium chloride     PRN Meds: sodium chloride, acetaminophen, hydrALAZINE, sodium chloride flush   Vital Signs    Vitals:   12/01/18 2046 12/02/18 0130 12/02/18 0544 12/02/18 0654  BP: (!) 141/82 (!) 145/85 (!) 158/89   Pulse: 71 73 75   Resp: 20 20 20    Temp: 98.4 F (36.9 C) 97.9 F (36.6 C) 98.3 F (36.8 C)   TempSrc: Oral Oral Oral   SpO2: 99% 100% 100%   Weight:    80.3 kg  Height:        Intake/Output Summary (Last 24 hours) at 12/02/2018 2025 Last data filed at 12/02/2018 0600 Gross per 24 hour  Intake 1412 ml  Output 1600 ml  Net -188 ml   Filed Weights   11/30/18 0850 12/01/18 0523 12/02/18 0654  Weight: 80.7 kg 78.7 kg 80.3 kg   Last Weight  Most recent update: 12/02/2018  6:54 AM   Weight  80.3 kg (177 lb)           Weight change: -0.454 kg   Telemetry    SR, no sig ectopy - Personally Reviewed  ECG    None today - Personally Reviewed  Physical Exam   General: Well developed, well nourished, male appearing in no acute distress. Head: Normocephalic, atraumatic.  Neck: Supple without bruits, JVD 10 cm. Lungs:  Resp regular and unlabored, few scattered rales. Heart: RRR, S1, S2, no S3, S4, or murmur; no rub. Abdomen: Soft, non-tender, non-distended  with normoactive bowel sounds. No hepatomegaly. No rebound/guarding. No obvious abdominal masses. Extremities: No clubbing, cyanosis, no edema. Radial pulses are 2+ bilaterally. LE pulses difficult to assess Neuro: Alert and oriented X 3. Moves all extremities spontaneously. Psych: Normal affect.  Labs    Hematology Recent Labs  Lab 11/30/18 0915 12/01/18 0237 12/01/18 1310 12/02/18 0541  WBC 8.5 6.6  --  6.3  RBC 3.97* 3.51* 3.71* 3.51*  HGB 10.6* 9.4*  --  9.4*  HCT 32.9* 28.5*  --  28.5*  MCV 82.9 81.2  --  81.2  MCH 26.7 26.8  --  26.8  MCHC 32.2 33.0  --  33.0  RDW 13.7 13.7  --  13.5  PLT 273 249  --  231    Chemistry Recent Labs  Lab 11/30/18 0915 12/01/18 0237 12/02/18 0541  NA 140 139 138  K 4.1 3.9 3.9  CL 111 110 108  CO2 16* 19* 20*  GLUCOSE 252* 194* 313*  BUN 43* 46* 47*  CREATININE 3.03* 3.22* 3.08*  CALCIUM 8.5* 8.2* 8.1*  PROT 7.6  --   --   ALBUMIN 3.2*  --   --   AST 16  --   --  ALT 9  --   --   ALKPHOS 68  --   --   BILITOT 0.9  --   --   GFRNONAA 19* 18* 19*  GFRAA 22* 21* 22*  ANIONGAP 13 10 10      Cardiac Enzymes Troponin I (High Sensitivity)  Date Value Ref Range Status  11/30/2018 115 (HH) <18 ng/L Final    Comment:    CRITICAL RESULT CALLED TO, READ BACK BY AND VERIFIED WITH: Zachery Dauer RN @ 1718 ON 11/30/2018 BY TEMOCHE, H Performed at [facility], [place] [address] [place], [place] [address]   11/30/2018 108 (HH) <18 ng/L Final    Comment:    CRITICAL VALUE NOTED.  VALUE IS CONSISTENT WITH PREVIOUSLY REPORTED AND CALLED VALUE. (NOTE) Elevated high sensitivity troponin I (hsTnI) values and significant  changes across serial measurements may suggest ACS but many other  chronic and acute conditions are known to elevate hsTnI results.  Refer to the Links section for chest pain algorithms and additional  guidance. Performed at [facility], [address] [place], [place] [address]   11/30/2018 123 (HH)  <18 ng/L Final    Comment:    CRITICAL RESULT CALLED TO, READ BACK BY AND VERIFIED WITH: Josph Macho RN [phone] PHILLIPS C (NOTE) Elevated high sensitivity troponin I (hsTnI) values and significant  changes across serial measurements may suggest ACS but many other  chronic and acute conditions are known to elevate hsTnI results.  Refer to the Links section for chest pain algorithms and additional  guidance. Performed at [facility], [address] [place], [place] [address]     BNP Recent Labs  Lab 11/30/18 0915  BNP 1,206.7*    Lab Results  Component Value Date   HGBA1C 9.5 (H) 11/30/2018   Iron/TIBC/Ferritin/ %Sat    Component Value Date/Time   IRON 28 (L) 12/01/2018 1310   TIBC 211 (L) 12/01/2018 1310   FERRITIN 605 (H) 12/01/2018 1310   IRONPCTSAT 13 (L) 12/01/2018 1310     Radiology    Dg Chest Port 1 View  Result Date: 11/30/2018 CLINICAL DATA:  Shortness of breath EXAM: PORTABLE CHEST 1 VIEW COMPARISON:  Chest radiograph and chest CT March 09, 2018 FINDINGS: There Is no appreciable edema or consolidation. Heart is upper normal in size with pulmonary vascularity normal. No adenopathy. There is aortic atherosclerosis. No bone lesions. IMPRESSION: No edema or consolidation. Heart upper normal in size. Aortic Atherosclerosis (ICD10-I70.0). Electronically Signed   By: Lowella Grip III M.D.   On: 11/30/2018 09:31     Cardiac Studies   Echocardiogram 11/30/2018: 1. The left ventricle has normal systolic function, with an ejection fraction of 55-60%. The cavity size was normal. There is moderately increased left ventricular wall thickness. Left ventricular diastolic Doppler parameters are consistent with  impaired relaxation. No evidence of left ventricular regional wall motion abnormalities. 2. The right ventricle has normal systolic function. The cavity was normal. There is no increase in right ventricular wall thickness. 3. Trivial pericardial  effusion is present. 4. No evidence of mitral valve stenosis. Trivial mitral regurgitation. 5. The aortic valve is tricuspid. No stenosis of the aortic valve. 6. The aortic root is normal in size and structure. 7. Normal IVC size. No complete TR doppler jet so unable to estimate PA systolic pressure.   Patient Profile     78 y.o. male w/ hx PAD, HTN, HLD, DM type 2 c/b neuropathy, CKD  stage 3, and remote tobacco abuse was admitted 06/27 with CHF and elevated troponin.  Assessment & Plan    1. Acute diastolic CHF w/ acute resp failure:  - I/O net -1.4 L since admit - wt 178>>173>>177 (pt says standing wt) - LE edema is improved, CXR did not show edema. - however, still w/ JVD, so continue Lasix for now - likely change to po Lasix later today or tomorrow - will add Imdur 30 mg - ambulate and see if O2 sats drop  2. CKD IV - Cr was 2.20 in January, 3.03 upon admission - 3.03>>3.22>>3.08 - continue to follow  3. Elevated troponin - nl EF and no WMA on echo - no reports of CP - however, CRF not well-controlled and also has PAD>>very high risk to have CAD - adding nitrates - discuss med rx vs MV for risk stratification  4. Anemia - +iron deficiency - likely related to CKD - per IM  Otherwise, per IM Active Problems:   Anemia   Essential hypertension   CHF (congestive heart failure) (HCC)   Acute respiratory failure with hypercapnia (HCC)   Elevated troponin   Prolonged QT interval   Acute renal failure superimposed on chronic kidney disease ([place])   CHF exacerbation (HCC)    Signed, Rosaria Ferries , PA-C 7:21 AM 12/02/2018 Pager: [phone]  Attending Note:   The patient was seen and examined.  Agree with assessment and plan as noted above.  Changes made to the above note as needed.  Patient seen and independently examined with Rosaria Ferries, PA .   We discussed all aspects of the encounter. I agree with the assessment and plan as stated above.  1.   Acute on chronic diastolic CHF:  In the setting of acute renal insufficiency.     Continue lasix.  Net diuresis of 1.4 liters so far this admission. Has not ambulated yet. Still has 1 + leg edema  Will change to torsemide 40 mg PO today . Ambulate Discussed with Dr. Broadus John.  Possible DC tomorrow .   2.   CKD - stage IV .   Likely due to DM.   Will need to follow up with his primary MD    I have spent a total of 40 minutes with patient reviewing hospital  notes , telemetry, EKGs, labs and examining patient as well as establishing an assessment and plan that was discussed with the patient. > 50% of time was spent in direct patient care.    Thayer Headings, Brooke Bonito., MD, [facility] 12/02/2018, 8:37 AM [address]  [place] Pager [phone]

## 2018-12-02 NOTE — Evaluation (Signed)
Physical Therapy Evaluation Patient Details Name: Jermaine Mitchell MRN: [id number] DOB: 06-Jul-1940 Today's Date: 12/02/2018   History of Present Illness  78 yo admitted with SOB, edema, CHF. PMhx: PAD, HLD, CKD stage III, HTN, remote history of tobacco abuse, and DM type II with neuropathy, diabetic retinopathy  Clinical Impression  Pt very pleasant and reports caring for himself at home with wife performing all homemaking. Pt reports he normally walks with a cane and wears glasses due to impaired vision but glasses not currently present with pt demonstrating decreased ability with transfers, gait, balance and safety. Vision may be impacting current function and pt encouraged to have family bring glasses. Pt with assist for gait and reliance on RW for stability with fatigue and limited function inhibiting current mobility. Pt will benefit from acute therapy to maximize mobility, safety and balance to decrease burden of care.   HR 75 SpO2 96% on RA    Follow Up Recommendations Home health PT;Supervision for mobility/OOB    Equipment Recommendations  Rolling walker with 5" wheels    Recommendations for Other Services OT consult     Precautions / Restrictions Precautions Precautions: Fall Restrictions Weight Bearing Restrictions: No      Mobility  Bed Mobility Overal bed mobility: Modified Independent             General bed mobility comments: pt able to transition to EOB with use of rail  Transfers Overall transfer level: Needs assistance   Transfers: Sit to/from Stand Sit to Stand: Min guard         General transfer comment: guarding for safety with cues for hand placement, backing to chair pt ditching RW too early and not reaching for surface with cues for safety and fall risk with impulsive sitting sequence  Ambulation/Gait Ambulation/Gait assistance: Min assist Gait Distance (Feet): 110 Feet Assistive device: Rolling walker (2 wheeled) Gait Pattern/deviations:  Step-through pattern;Decreased stride length   Gait velocity interpretation: >2.62 ft/sec, indicative of community ambulatory General Gait Details: pt slightly impulsive with gait with decreased safety awareness, reliance on RW for gaiw tih pt running into obstacles and maintaining self too posterior. mod cues for proximity and safety with assist to direct and control RW at times. pt partially dragging left toes to point of sock half way off upon return to room  Stairs            Wheelchair Mobility    Modified Rankin (Stroke Patients Only)       Balance Overall balance assessment: Needs assistance Sitting-balance support: No upper extremity supported;Feet supported Sitting balance-Leahy Scale: Good Sitting balance - Comments: pt able to sit EOB without UE support   Standing balance support: Bilateral upper extremity supported Standing balance-Leahy Scale: Poor Standing balance comment: UE support for standing                             Pertinent Vitals/Pain Pain Assessment: No/denies pain    Home Living Family/patient expects to be discharged to:: Private residence Living Arrangements: Spouse/significant other Available Help at Discharge: Family;Available 24 hours/day Type of Home: House Home Access: Stairs to enter Entrance Stairs-Rails: Psychiatric nurse of Steps: 4 Home Layout: One level Home Equipment: Cane - single point      Prior Function Level of Independence: Independent with assistive device(s)         Comments: wife does the homemaking, doesn't drive     Hand Dominance  Extremity/Trunk Assessment   Upper Extremity Assessment Upper Extremity Assessment: Overall WFL for tasks assessed    Lower Extremity Assessment Lower Extremity Assessment: Overall WFL for tasks assessed    Cervical / Trunk Assessment Cervical / Trunk Assessment: Normal  Communication   Communication: No difficulties  Cognition  Arousal/Alertness: Awake/alert Behavior During Therapy: WFL for tasks assessed/performed Overall Cognitive Status: Impaired/Different from baseline Area of Impairment: Safety/judgement                         Safety/Judgement: Decreased awareness of deficits;Decreased awareness of safety            General Comments      Exercises     Assessment/Plan    PT Assessment Patient needs continued PT services  PT Problem List Decreased mobility;Decreased safety awareness;Decreased activity tolerance;Decreased coordination;Decreased cognition;Decreased balance;Decreased knowledge of use of DME       PT Treatment Interventions Gait training;Therapeutic exercise;Patient/family education;Stair training;Functional mobility training;Balance training;DME instruction;Therapeutic activities;Cognitive remediation    PT Goals (Current goals can be found in the Care Plan section)  Acute Rehab PT Goals Patient Stated Goal: get home and watch tv PT Goal Formulation: With patient Time For Goal Achievement: 12/16/18 Potential to Achieve Goals: Good    Frequency Min 3X/week   Barriers to discharge Decreased caregiver support      Co-evaluation               AM-PAC PT "6 Clicks" Mobility  Outcome Measure Help needed turning from your back to your side while in a flat bed without using bedrails?: A Little   Help needed moving to and from a bed to a chair (including a wheelchair)?: A Little Help needed standing up from a chair using your arms (e.g., wheelchair or bedside chair)?: A Little Help needed to walk in hospital room?: A Little Help needed climbing 3-5 steps with a railing? : A Little 6 Click Score: 15    End of Session Equipment Utilized During Treatment: Gait belt Activity Tolerance: Patient tolerated treatment well Patient left: in chair;with call bell/phone within reach;with nursing/sitter in room;with chair alarm set Nurse Communication: Mobility status PT  Visit Diagnosis: Other abnormalities of gait and mobility (R26.89);Unsteadiness on feet (R26.81)    Time: 0312-8118 PT Time Calculation (min) (ACUTE ONLY): 22 min   Charges:   PT Evaluation $PT Eval Moderate Complexity: [place], PT Acute Rehabilitation Services Pager: [phone] Office: [phone]   Sandy Salaam Braeleigh Pyper 12/02/2018, 12:16 PM

## 2018-12-02 NOTE — Progress Notes (Signed)
Results for TRU, RANA (MRN [id number]) as of 12/02/2018 16:04  Ref. Range 12/01/2018 21:14 12/01/2018 23:44 12/02/2018 06:10 12/02/2018 11:36  Glucose-Capillary Latest Ref Range: 70 - 99 mg/dL 88 151 (H) 303 (H) 278 (H)  Noted that last few blood sugars have been greater than 180 mg/dl.   Recommend adding Novolog SENSITIVE correction scale TID & HS scale along with 70/30 insulin dosages as ordered if blood sugars continue to be elevated.  Harvel Ricks RN BSN CDE Diabetes Coordinator Pager: [phone]  8am-5pm

## 2018-12-02 NOTE — Evaluation (Signed)
Occupational Therapy Evaluation Patient Details Name: Jermaine Mitchell MRN: [id number] DOB: [birth year] Today's Date: 12/02/2018    History of Present Illness 78 yo admitted with SOB, edema, CHF. PMhx: PAD, HLD, CKD stage III, HTN, remote history of tobacco abuse, and DM type II with neuropathy, diabetic retinopathy   Clinical Impression   PTA patient independent with ADLs, using cane for mobility, spouse assists with IADLs. Admitted for above and limited by problem list below, including impaired balance, decreased activity tolerance, and decreased safety awareness/walker mgmt.  Patient typically wears glasses, which he does not have at the hospital, and presents with increased difficulty visually during mobility, requiring increased verbal cueing for safety and obstacle awareness.  Patient completing transfers with min guard, grooming with min guard, LB ADLs with min-min guard assist, and simulated shower transfers with min assist (1 LOB stepping over threshold requiring min assist for balance). Pt reports spouse will be able to provide 24/7 support as needed, and he is agreeable to 3:1 for shower chair.  Recommend continued OT services while admitted and after dc at [facility] level in order to maximize independnece and safety with ADLs/mobility.     Follow Up Recommendations  Home health OT;Supervision/Assistance - 24 hour    Equipment Recommendations  3 in 1 bedside commode    Recommendations for Other Services Rehab consult     Precautions / Restrictions Precautions Precautions: Fall Restrictions Weight Bearing Restrictions: No      Mobility Bed Mobility Overal bed mobility: Modified Independent             General bed mobility comments: no assist required  Transfers Overall transfer level: Needs assistance Equipment used: Rolling walker (2 wheeled) Transfers: Sit to/from Stand Sit to Stand: Min guard         General transfer comment: min guard for safety and balance, poor  walker mgmt safety and impulsive at times     Balance Overall balance assessment: Needs assistance Sitting-balance support: No upper extremity supported;Feet supported Sitting balance-Leahy Scale: Good     Standing balance support: Bilateral upper extremity supported Standing balance-Leahy Scale: Poor Standing balance comment: UE support for standing                           ADL either performed or assessed with clinical judgement   ADL Overall ADL's : Needs assistance/impaired     Grooming: Min guard;Standing   Upper Body Bathing: Sitting;Supervision/ safety   Lower Body Bathing: Minimal assistance;Sit to/from stand   Upper Body Dressing : Sitting;Set up   Lower Body Dressing: Sit to/from stand;Min guard Lower Body Dressing Details (indicate cue type and reason): able to don/doff socks, min guard sit to stand  Toilet Transfer: Min guard;Ambulation;RW Toilet Transfer Details (indicate cue type and reason): simulated in room     Tub/ Shower Transfer: Walk-in shower;Minimal assistance;Ambulation;3 in 1;Grab bars;Rolling walker Tub/Shower Transfer Details (indicate cue type and reason): min assist for safety, 1 LOB stepping over threshold with min assist for safety/balance Functional mobility during ADLs: Min guard;Rolling walker       Vision Baseline Vision/History: Wears glasses Wears Glasses: At all times Patient Visual Report: Blurring of vision(hx of diabetic retinopathy, does not have glasses here) Additional Comments: pt does not have his glasses here- he is able to read the clock but grossly limiting safety with mobility      Perception     Praxis      Pertinent Vitals/Pain Pain Assessment: No/denies pain  Hand Dominance Right   Extremity/Trunk Assessment Upper Extremity Assessment Upper Extremity Assessment: Overall WFL for tasks assessed   Lower Extremity Assessment Lower Extremity Assessment: Defer to PT evaluation   Cervical / Trunk  Assessment Cervical / Trunk Assessment: Normal   Communication Communication Communication: No difficulties   Cognition Arousal/Alertness: Awake/alert Behavior During Therapy: WFL for tasks assessed/performed Overall Cognitive Status: Impaired/Different from baseline Area of Impairment: Safety/judgement;Awareness                         Safety/Judgement: Decreased awareness of deficits;Decreased awareness of safety Awareness: Emergent   General Comments: pt with decreased safety awareness, awareness to deficits    General Comments       Exercises     Shoulder Instructions      Home Living Family/patient expects to be discharged to:: Private residence Living Arrangements: Spouse/significant other Available Help at Discharge: Family;Available 24 hours/day Type of Home: House Home Access: Stairs to enter [employer] of Steps: 4 Entrance Stairs-Rails: Right;Left Home Layout: One level     Bathroom Shower/Tub: Occupational psychologist: Handicapped height     Home Equipment: Cane - single point          Prior Functioning/Environment Level of Independence: Independent with assistive device(s)        Comments: wife does the homemaking, doesn't drive        OT Problem List: Decreased activity tolerance;Impaired balance (sitting and/or standing);Decreased safety awareness;Decreased knowledge of use of DME or AE;Decreased knowledge of precautions      OT Treatment/Interventions: Self-care/ADL training;Therapeutic activities;Balance training;Energy conservation;DME and/or AE instruction    OT Goals(Current goals can be found in the care plan section) Acute Rehab OT Goals Patient Stated Goal: to get back home  OT Goal Formulation: With patient Time For Goal Achievement: 12/16/18 Potential to Achieve Goals: Good  OT Frequency: Min 2X/week   Barriers to D/C:            Co-evaluation              AM-PAC OT "6 Clicks" Daily  Activity     Outcome Measure Help from another person eating meals?: None Help from another person taking care of personal grooming?: A Little Help from another person toileting, which includes using toliet, bedpan, or urinal?: A Little Help from another person bathing (including washing, rinsing, drying)?: A Little Help from another person to put on and taking off regular upper body clothing?: A Little Help from another person to put on and taking off regular lower body clothing?: A Little 6 Click Score: 19   End of Session Equipment Utilized During Treatment: Rolling walker Nurse Communication: Mobility status  Activity Tolerance: Patient tolerated treatment well Patient left: in bed;with call bell/phone within reach;with bed alarm set  OT Visit Diagnosis: Other abnormalities of gait and mobility (R26.89)                Time: 1761-6073 OT Time Calculation (min): 14 min Charges:  OT General Charges $OT Visit: 1 Visit OT Evaluation $OT Eval Low Complexity: 1 Low  Delight Stare, OT Acute Rehabilitation Services Pager [phone] Office [phone]   Delight Stare 12/02/2018, 5:11 PM

## 2018-12-03 DIAGNOSIS — I10 Essential (primary) hypertension: Secondary | ICD-10-CM

## 2018-12-03 LAB — BASIC METABOLIC PANEL
Anion gap: 10 (ref 5–15)
BUN: 41 mg/dL — ABNORMAL HIGH (ref 8–23)
CO2: 21 mmol/L — ABNORMAL LOW (ref 22–32)
Calcium: 8.3 mg/dL — ABNORMAL LOW (ref 8.9–10.3)
Chloride: 108 mmol/L (ref 98–111)
Creatinine, Ser: 3.04 mg/dL — ABNORMAL HIGH (ref 0.61–1.24)
GFR calc Af Amer: 22 mL/min — ABNORMAL LOW (ref >60)
GFR calc non Af Amer: 19 mL/min — ABNORMAL LOW (ref >60)
Glucose, Bld: 191 mg/dL — ABNORMAL HIGH (ref 70–99)
Potassium: 3.5 mmol/L (ref 3.5–5.1)
Sodium: 139 mmol/L (ref 135–145)

## 2018-12-03 LAB — GLUCOSE, CAPILLARY
Glucose-Capillary: 180 mg/dL — ABNORMAL HIGH (ref 70–99)
Glucose-Capillary: 262 mg/dL — ABNORMAL HIGH (ref 70–99)

## 2018-12-03 MED ORDER — HYDRALAZINE HCL 25 MG PO TABS: 25.0000 mg | ORAL_TABLET | Freq: Three times a day (TID) | ORAL | 0 refills | Status: DC

## 2018-12-03 MED ORDER — HYDRALAZINE HCL 25 MG PO TABS
25.0000 mg | ORAL_TABLET | Freq: Three times a day (TID) | ORAL | Status: DC
  Administered 2018-12-03: 25 mg via ORAL
  Filled 2018-12-03: qty 1

## 2018-12-03 MED ORDER — ISOSORBIDE MONONITRATE ER 30 MG PO TB24: 30.0000 mg | ORAL_TABLET | Freq: Every day | ORAL | 0 refills | Status: DC

## 2018-12-03 MED ORDER — TORSEMIDE 20 MG PO TABS: 40.0000 mg | ORAL_TABLET | Freq: Every day | ORAL | 0 refills | Status: DC

## 2018-12-03 NOTE — Progress Notes (Addendum)
Progress Note  Patient Name: Jermaine Mitchell Date of Encounter: 12/03/2018  Primary Cardiologist: Marny Lowenstein, MD  Subjective   78 yo with acute on chronic diastolic CHF, CKD, anemia admitted with respiratory failure.   Says will make sure to weigh daily  Inpatient Medications    Scheduled Meds: . amLODipine  10 mg Oral Daily  . aspirin EC  81 mg Oral Daily  . atorvastatin  80 mg Oral Q breakfast  . carvedilol  25 mg Oral BID WC  . heparin  5,000 Units Subcutaneous Q8H  . insulin aspart protamine- aspart  10 Units Subcutaneous Q supper  . insulin aspart protamine- aspart  30 Units Subcutaneous Q breakfast  . isosorbide mononitrate  30 mg Oral Daily  . sodium chloride flush  3 mL Intravenous Q12H  . torsemide  40 mg Oral Daily   Continuous Infusions: . sodium chloride     PRN Meds: sodium chloride, acetaminophen, hydrALAZINE, sodium chloride flush   Vital Signs    Vitals:   12/02/18 1140 12/02/18 1417 12/02/18 1945 12/03/18 0523  BP: 139/82 130/74 (!) 141/77 (!) 170/82  Pulse: 72 72 69 69  Resp: 19  19 19   Temp: 97.6 F (36.4 C) 97.9 F (36.6 C) 97.6 F (36.4 C) 97.9 F (36.6 C)  TempSrc: Oral Oral Oral Oral  SpO2: 100% 100% 97% 98%  Weight:    79.2 kg  Height:        Intake/Output Summary (Last 24 hours) at 12/03/2018 0748 Last data filed at 12/03/2018 0700 Gross per 24 hour  Intake 600 ml  Output 2205 ml  Net -1605 ml   Filed Weights   12/01/18 0523 12/02/18 0654 12/03/18 0523  Weight: 78.7 kg 80.3 kg 79.2 kg   Last Weight  Most recent update: 12/03/2018  5:26 AM   Weight  79.2 kg (174 lb 8 oz)           Weight change: -1.134 kg   Telemetry    SR, No ectopy- Personally Reviewed  ECG    None today - Personally Reviewed  Physical Exam   General: Well developed, well nourished, male in no acute distress Head: Eyes PERRLA, No xanthomas.   Normocephalic and atraumatic Lungs: decreased BS bases to auscultation. Heart: HRRR S1 S2,  without MRG. Radial Pulses are 2+ & equal. JVD 8 cm. Abdomen: Bowel sounds are present, abdomen soft and non-tender without masses or  hernias noted. Msk: Normal strength and tone for age. Extremities: No clubbing, cyanosis or edema.    Skin:  No rashes or lesions noted. Neuro: Alert and oriented X 3. Psych:  Good affect, responds appropriately   Labs    Hematology Recent Labs  Lab 11/30/18 0915 12/01/18 0237 12/01/18 1310 12/02/18 0541  WBC 8.5 6.6  --  6.3  RBC 3.97* 3.51* 3.71* 3.51*  HGB 10.6* 9.4*  --  9.4*  HCT 32.9* 28.5*  --  28.5*  MCV 82.9 81.2  --  81.2  MCH 26.7 26.8  --  26.8  MCHC 32.2 33.0  --  33.0  RDW 13.7 13.7  --  13.5  PLT 273 249  --  231    Chemistry Recent Labs  Lab 11/30/18 0915 12/01/18 0237 12/02/18 0541 12/03/18 0641  NA 140 139 138 139  K 4.1 3.9 3.9 3.5  CL 111 110 108 108  CO2 16* 19* 20* 21*  GLUCOSE 252* 194* 313* 191*  BUN 43* 46* 47* 41*  CREATININE 3.03* 3.22*  3.08* 3.04*  CALCIUM 8.5* 8.2* 8.1* 8.3*  PROT 7.6  --   --   --   ALBUMIN 3.2*  --   --   --   AST 16  --   --   --   ALT 9  --   --   --   ALKPHOS 68  --   --   --   BILITOT 0.9  --   --   --   GFRNONAA 19* 18* 19* 19*  GFRAA 22* 21* 22* 22*  ANIONGAP 13 10 10 10      Cardiac Enzymes Troponin I (High Sensitivity)  Date Value Ref Range Status  11/30/2018 115 (HH) <18 ng/L Final    Comment:    CRITICAL RESULT CALLED TO, READ BACK BY AND VERIFIED WITH: Zachery Dauer RN @ 1718 ON 11/30/2018 BY TEMOCHE, H Performed at [facility], [place] [address] [place], [place] [address]   11/30/2018 108 (HH) <18 ng/L Final    Comment:    CRITICAL VALUE NOTED.  VALUE IS CONSISTENT WITH PREVIOUSLY REPORTED AND CALLED VALUE. (NOTE) Elevated high sensitivity troponin I (hsTnI) values and significant  changes across serial measurements may suggest ACS but many other  chronic and acute conditions are known to elevate hsTnI results.  Refer to the Links section for chest pain  algorithms and additional  guidance. Performed at [facility], [address] [place], [place] [address]   11/30/2018 123 (HH) <18 ng/L Final    Comment:    CRITICAL RESULT CALLED TO, READ BACK BY AND VERIFIED WITH: Josph Macho RN [phone] PHILLIPS C (NOTE) Elevated high sensitivity troponin I (hsTnI) values and significant  changes across serial measurements may suggest ACS but many other  chronic and acute conditions are known to elevate hsTnI results.  Refer to the Links section for chest pain algorithms and additional  guidance. Performed at [facility], [address] [place], [place] [address]     BNP Recent Labs  Lab 11/30/18 0915  BNP 1,206.7*    Lab Results  Component Value Date   HGBA1C 9.5 (H) 11/30/2018   Iron/TIBC/Ferritin/ %Sat    Component Value Date/Time   IRON 28 (L) 12/01/2018 1310   TIBC 211 (L) 12/01/2018 1310   FERRITIN 605 (H) 12/01/2018 1310   IRONPCTSAT 13 (L) 12/01/2018 1310     Radiology    Dg Chest Port 1 View  Result Date: 11/30/2018 CLINICAL DATA:  Shortness of breath EXAM: PORTABLE CHEST 1 VIEW COMPARISON:  Chest radiograph and chest CT March 09, 2018 FINDINGS: There Is no appreciable edema or consolidation. Heart is upper normal in size with pulmonary vascularity normal. No adenopathy. There is aortic atherosclerosis. No bone lesions. IMPRESSION: No edema or consolidation. Heart upper normal in size. Aortic Atherosclerosis (ICD10-I70.0). Electronically Signed   By: Lowella Grip III M.D.   On: 11/30/2018 09:31     Cardiac Studies   Echocardiogram 11/30/2018: 1. The left ventricle has normal systolic function, with an ejection fraction of 55-60%. The cavity size was normal. There is moderately increased left ventricular wall thickness. Left ventricular diastolic Doppler parameters are consistent with  impaired relaxation. No evidence of left ventricular regional wall motion abnormalities. 2. The right  ventricle has normal systolic function. The cavity was normal. There is no increase in right ventricular wall thickness. 3. Trivial pericardial effusion is present. 4. No evidence of mitral valve stenosis. Trivial mitral regurgitation. 5. The aortic  valve is tricuspid. No stenosis of the aortic valve. 6. The aortic root is normal in size and structure. 7. Normal IVC size. No complete TR doppler jet so unable to estimate PA systolic pressure.   Patient Profile     78 y.o. male w/ hx PAD, HTN, HLD, DM type 2 c/b neuropathy, CKD stage 3, and remote tobacco abuse was admitted 06/27 with CHF and elevated troponin.  Assessment & Plan    1. Acute diastolic CHF w/ acute resp failure:  - volume improved - I/O net -3 L - wt 178>>173>>177>>174.5 - feel goal wt could be 175 lbs, call if he gains 3 lbs in a day or 5 lbs in a week - now on po Torsemide - continue amlodipine, Coreg  - Increase Imdur to bid  2. CKD IV - Cr 2.20 in 06/2018 - 3.03>>3.22>>3.08>>3.04 - no sig change w/ diuresis - per IM  3. Elevated troponin - no CP, EF nl, without WMA on echo - on nitrates and BB - f/u as outpt  No further cardiac workup>>will arrange f/u  Otherwise, per IM Active Problems:   Anemia   Essential hypertension   CHF (congestive heart failure) (HCC)   Acute respiratory failure with hypercapnia (HCC)   Elevated troponin   Prolonged QT interval   Acute renal failure superimposed on chronic kidney disease ([place])   CHF exacerbation ([place])   Signed, Rosaria Ferries , PA-C 7:48 AM 12/03/2018 Pager: [phone]  Attending Note:   The patient was seen and examined.  Agree with assessment and plan as noted above.  Changes made to the above note as needed.  Patient seen and independently examined with  Rosaria Ferries, PA .   We discussed all aspects of the encounter. I agree with the assessment and plan as stated above.  1.   Acute on chronic diastolic CHF:   Feels better after  diuresis of 3 liters.   2.   Essential HTN:   On amlodipine 10 mg a day, coreg 25 BID, Demadex 40 mg a day .   Imdur 30 mg a day  Have added scheduled hydralazine 25 mg TID.  3.  CKD:  Further plans per his primary MD .   He will also need to see a nephrologist.     I have spent a total of 40 minutes with patient reviewing hospital  notes , telemetry, EKGs, labs and examining patient as well as establishing an assessment and plan that was discussed with the patient. > 50% of time was spent in direct patient care.    Thayer Headings, Brooke Bonito., MD, [facility] 12/03/2018, 9:20 AM [address],  [place] Pager [phone]

## 2018-12-03 NOTE — Progress Notes (Signed)
Patient alert and oriented, denies pain, v/s stable, iv and tele d/c, d/c instruction given to patient wife over the phone, all questions answered will d/c patient per order.

## 2018-12-03 NOTE — Progress Notes (Signed)
Inpatient Diabetes Program Recommendations  AACE/ADA: New Consensus Statement on Inpatient Glycemic Control (2015)  Target Ranges:  Prepandial:   less than 140 mg/dL      Peak postprandial:   less than 180 mg/dL (1-2 hours)      Critically ill patients:  140 - 180 mg/dL   Lab Results  Component Value Date   GLUCAP 262 (H) 12/03/2018   HGBA1C 9.5 (H) 11/30/2018    Review of Glycemic Control Results for Jermaine Mitchell, Jermaine Mitchell (MRN [id number]) as of 12/03/2018 11:33  Ref. Range 12/02/2018 11:36 12/02/2018 16:19 12/02/2018 21:17 12/03/2018 06:12 12/03/2018 11:28  Glucose-Capillary Latest Ref Range: 70 - 99 mg/dL 278 (H) 204 (H) 153 (H) 180 (H) 262 (H)   Diabetes history: DM Outpatient Diabetes medications: 70/30 insulin 30 U every am, 20 U at suppe Current orders for Inpatient glycemic control:  70/30 30 U every am, 10 U at supper  Inpatient Diabetes Program Recommendations:   -Increase Novolog 70/30 am dose to 35 units  Thank you, Bethena Roys E. Lummie Montijo, RN, MSN, CDE  Diabetes Coordinator Inpatient Glycemic Control Team Team Pager [phone] (8am-5pm) 12/03/2018 11:38 AM

## 2018-12-03 NOTE — TOC Transition Note (Signed)
Transition of Care [facility]) - CM/SW Discharge Note   Patient Details  Name: Jermaine Mitchell MRN: [id number] Date of Birth: 1940/07/28  Transition of Care [facility]) CM/SW Contact:  Bethena Roys, RN Phone Number: 12/03/2018, 12:45 PM   Clinical Narrative:  Pt presented for CHF- PTA from home with spouse. Plan for transition home with [facility]. Choice offered to wife and plan was to use [facility], However they could not staff due to the holiday. CM did call [facility] and they will be able to staff the case. SOC to begin within 24-48 hours post transition home. No further needs from CM at this time.    Final next level of care: [place] Barriers to Discharge: No Barriers Identified   Patient Goals and CMS Choice Patient states their goals for this hospitalization and ongoing recovery are:: to go back home with wife   Choice offered to / list presented to : Spouse(Had used [facility] before- however had to use [facility])  Discharge Placement                       Discharge Plan and Services In-house Referral: NA Discharge Planning Services: CM Consult Post Acute Care Choice: Home Health          DME Arranged: 3-N-1, Walker rolling DME Agency: [employer] Date DME Agency Contacted: 12/03/18 Time DME Agency Contacted: [id number] Representative spoke with at DME Agency: [place]: PT, OT [place] Agency: [facility] [place] Date [place]: 12/03/18 Time [place]: 35 Representative spoke with at [place]: [place] ([place]) Interventions     Readmission Risk Interventions No flowsheet data found.

## 2018-12-03 NOTE — Progress Notes (Signed)
Occupational Therapy Treatment Patient Details Name: Jermaine Mitchell MRN: [id number] DOB: Nov 12, 1940 Today's Date: 12/03/2018    History of present illness 78 yo admitted with SOB, edema, CHF. PMhx: PAD, HLD, CKD stage III, HTN, remote history of tobacco abuse, and DM type II with neuropathy, diabetic retinopathy   OT comments  Patient progressing slowly.  Continues to require min guard for transfers and LB ADLs, limited by impaired balance and generalized weakness.  Initiated education on fall prevention techniques and energy conservation, continued education of walker mgmt safety. Good recall of shower transfer safety during session, using RW to support self over threshold and continue to recommend 3:1 for shower chair at dc.  DC plan remains appropriate.    Follow Up Recommendations  Home health OT;Supervision/Assistance - 24 hour    Equipment Recommendations  3 in 1 bedside commode    Recommendations for Other Services      Precautions / Restrictions Precautions Precautions: Fall Restrictions Weight Bearing Restrictions: No       Mobility Bed Mobility Overal bed mobility: Modified Independent             General bed mobility comments: no assist required  Transfers Overall transfer level: Needs assistance Equipment used: Rolling walker (2 wheeled) Transfers: Sit to/from Stand Sit to Stand: Min guard         General transfer comment: min guard for safety and balance, poor walker mgmt safety     Balance Overall balance assessment: Needs assistance Sitting-balance support: No upper extremity supported;Feet supported Sitting balance-Leahy Scale: Good     Standing balance support: Bilateral upper extremity supported Standing balance-Leahy Scale: Poor Standing balance comment: UE support for standing                           ADL either performed or assessed with clinical judgement   ADL Overall ADL's : Needs assistance/impaired              Lower Body Bathing: Min guard;Sit to/from stand Lower Body Bathing Details (indicate cue type and reason): reviewed safety bathing sitting on [place]     Lower Body Dressing: Sit to/from stand;Min guard Lower Body Dressing Details (indicate cue type and reason): min guard sit<>stand, donned socks without assist; reviewed safety dresssing seated Toilet Transfer: Min guard;Ambulation;RW Toilet Transfer Details (indicate cue type and reason): simulated in room to recliner      Tub/ Shower Transfer: Walk-in shower;Min guard;Ambulation;Rolling walker;3 in 1 Tub/Shower Transfer Details (indicate cue type and reason): min guard for safety and balance, educated on reverse step transfer over threshold using RW with min guard Functional mobility during ADLs: Min guard;Rolling walker General ADL Comments: reviewed energy conservation and fall prevention techniques, continues to require min cueing for walker mgmt and safety      Vision       Perception     Praxis      Cognition Arousal/Alertness: Awake/alert Behavior During Therapy: WFL for tasks assessed/performed Overall Cognitive Status: Impaired/Different from baseline Area of Impairment: Safety/judgement;Awareness                         Safety/Judgement: Decreased awareness of deficits;Decreased awareness of safety Awareness: Emergent   General Comments: pt with poor carryover of safety techniques using RW        Exercises     Shoulder Instructions       General Comments      Pertinent Vitals/ Pain  Pain Assessment: No/denies pain  Home Living                                          Prior Functioning/Environment              Frequency  Min 2X/week        Progress Toward Goals  OT Goals(current goals can now be found in the care plan section)  Progress towards OT goals: Progressing toward goals  Acute Rehab OT Goals Patient Stated Goal: home today OT Goal Formulation:  With patient  Plan Discharge plan remains appropriate;Frequency remains appropriate    Co-evaluation                 AM-PAC OT "6 Clicks" Daily Activity     Outcome Measure   Help from another person eating meals?: None Help from another person taking care of personal grooming?: A Little Help from another person toileting, which includes using toliet, bedpan, or urinal?: A Little Help from another person bathing (including washing, rinsing, drying)?: A Little Help from another person to put on and taking off regular upper body clothing?: A Little Help from another person to put on and taking off regular lower body clothing?: A Little 6 Click Score: 19    End of Session Equipment Utilized During Treatment: Rolling walker  OT Visit Diagnosis: Other abnormalities of gait and mobility (R26.89)   Activity Tolerance Patient tolerated treatment well   Patient Left in chair;with call bell/phone within reach;with chair alarm set   Nurse Communication Mobility status        Time: 6314-9702 OT Time Calculation (min): 18 min  Charges: OT General Charges $OT Visit: 1 Visit OT Treatments $Self Care/Home Management : 8-22 mins  Delight Stare, [place] Pager [phone] Office [phone]    Delight Stare 12/03/2018, 11:58 AM

## 2018-12-03 NOTE — Discharge Summary (Signed)
Physician Discharge Summary  Jermaine Mitchell WSF:[id number] DOB: Mar 20, 1941 DOA: 11/30/2018  PCP: Mosie Lukes, MD  Admit date: 11/30/2018 Discharge date: 12/03/2018  Time spent: 35 minutes  Recommendations for Outpatient Follow-up:  1. PCP Dr. Charlett Blake on 7/10 2. Cardiology Dr. Gabriela Eves on 7/16 3. 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 Elevated troponin Anemia of chronic disease   Essential hypertension   Acute respiratory failure with hypercapnia (HCC)   Elevated troponin   Prolonged QT interval Type 2 diabetes mellitus Peripheral vascular disease    Discharge Condition: Stable  Diet recommendation: Low-sodium heart healthy  Filed Weights   12/01/18 0523 12/02/18 0654 12/03/18 0523  Weight: 78.7 kg 80.3 kg 79.2 kg    History of present illness:  Jermaine Smithis a 78 y.o.malewith medical history significant ofPAD, HLD, CKD stage III, remote history of tobacco abuse, and DM type II with neuropathy; who presents with complaints of dyspnea on exertion for 5 days associated with lower extremity edema, orthopnea. -In the emergency room labs noted a creatinine of 3.0, troponin of 123 unremarkable, he was started on diuretics for fluid overload   Hospital Course:   Acute diastolic CHF -Presented with dyspnea lower extremity edema, PND and orthopnea, mild hypoxia -2D echocardiogram noted EF of 60 to 65% and grade 1 diastolic dysfunction -Clinically improved with diuresis, he is negative over 3.4 L, creatinine at 3.0 now and stable -Followed by cardiology this admission, transition to oral torsemide yesterday, creatinine remains stable and volume status remains improved -Discharged home in a stable condition to follow-up with cardiology and nephrology -Home health services also set up  Elevated troponin -Flat trend, suspect demand ischemia in the  setting of fluid overload, acute diastolic CHF -Echocardiogram with preserved wall motion, normal EF  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 -Baseline creatinine was 2.2 in 06/2018, admission creatinine was 3.2, this trended down to 3.0 and is stable in this range, -Diuresed with IV Lasix as noted above, volume status improved, had been referred to a nephrologist in the past however missed 2 appointments,  -Was referred to see a nephrologist in the past but has not seen one yet, discussed with wife, she is rescheduling his appointment -Creatinine stable at 3.0, advised to continue torsemide, salt/fluid restriction  Essential hypertension -Continue Coreg, improved with diuresis, also started on hydralazine and amlodipine  Type 2 diabetes mellitus -Hemoglobin A1c is 9.5, continue insulin 70/30 and sliding scale  Peripheral vascular disease -Continue aspirin Plavix statin  Anemia of chronic disease -Hemoglobin slightly lower than baseline, anemia panel with mild iron deficiency, -given IV Feraheme x1 yesterday -Will benefit from EPO at Renal FU    Consultations:  Cardiology  Discharge Exam: Vitals:   12/03/18 0937 12/03/18 1132  BP: (!) 144/92 126/72  Pulse: 73 69  Resp:  18  Temp:  (!) 97.5 F (36.4 C)  SpO2: 98% 99%    General: AAO x3 Cardiovascular: S1-S2/regular rate rhythm Respiratory: Improved air movement clear bilaterally  Discharge Instructions   Discharge Instructions    Diet - low sodium heart healthy   Complete by: As directed    Diet Carb Modified   Complete by: As directed    Increase activity slowly   Complete by: As directed      Allergies as of 12/03/2018      Reactions   Lisinopril Swelling, Other (See Comments)   Lip swelling  Codeine Other (See Comments)   "Makes me feel funny."      Medication List    TAKE these medications   Accu-Chek Softclix Lancets lancets Use as directed once daily to check blood sugar. DX E11.9    amLODipine 10 MG tablet Commonly known as: NORVASC TAKE 1 TABLET BY MOUTH ONCE DAILY   aspirin 81 MG EC tablet Take 81 mg by mouth daily. Swallow whole.   atorvastatin 80 MG tablet Commonly known as: LIPITOR Take 1 tablet (80 mg total) by mouth daily with breakfast.   B-D SINGLE USE SWABS REGULAR Pads Use as directed daily to check blood sugar.  DX E11.9   carvedilol 25 MG tablet Commonly known as: COREG TAKE 1 TABLET BY MOUTH TWICE DAILY WITH MEALS   glucose blood test strip Commonly known as: Accu-Chek Aviva Plus 1 each by Other route 2 (two) times daily. And lancets 2/day   hydrALAZINE 25 MG tablet Commonly known as: APRESOLINE Take 1 tablet (25 mg total) by mouth every 8 (eight) hours.   insulin NPH-regular Human (70-30) 100 UNIT/ML injection Commonly known as: NovoLIN 70/30 ReliOn 36 units with breakfast, and 8 units with evening meal, and syringes 2/day What changed:   how much to take  how to take this  when to take this  additional instructions   isosorbide mononitrate 30 MG 24 hr tablet Commonly known as: IMDUR Take 1 tablet (30 mg total) by mouth daily. Start taking on: December 04, 2018   loratadine 10 MG tablet Commonly known as: CLARITIN Take 1 tablet (10 mg total) by mouth daily.   torsemide 20 MG tablet Commonly known as: DEMADEX Take 2 tablets (40 mg total) by mouth daily. Start taking on: December 04, 2018            Durable Medical Equipment  (From admission, onward)         Start     Ordered   12/03/18 1403  For home use only DME Walker rolling  Once    Question:  Patient needs a walker to treat with the following condition  Answer:  Gait disorder   12/03/18 1403   12/03/18 1403  For home use only DME 3 n 1  Once     12/03/18 1403         Allergies  Allergen Reactions  . Lisinopril Swelling and Other (See Comments)    Lip swelling   . Codeine Other (See Comments)    "Makes me feel funny."   Follow-up Information    Mosie Lukes, MD. Go on 12/13/2018.   Specialty: Family Medicine Why: @11 :[place] information: [address] [place] [address] [phone]        Nahser, Wonda Cheng, MD. Go on 12/19/2018.   Specialty: Cardiology Why: @9 :00am Contact information: [place] [address] [place] [address] [phone]        [facility] Follow up.   Why: Rolling Walker and Bedside commode.  Contact information: [place] [address] [phone]        [facility]a A[facility], [facility] Follow up.   Specialty: Home Health Services Why: Physical and Occupational Therapy-office to call you with a visit time.  Contact information: [place] [address] [phone]            The results of significant diagnostics from this hospitalization (including imaging, microbiology, ancillary and laboratory) are listed  below for reference.    Significant Diagnostic Studies: Dg Chest Port 1 View  Result Date: 11/30/2018 CLINICAL DATA:  Shortness of breath EXAM: PORTABLE CHEST 1 VIEW COMPARISON:  Chest radiograph and chest CT March 09, 2018 FINDINGS: There Is no appreciable edema or consolidation. Heart is upper normal in size with pulmonary vascularity normal. No adenopathy. There is aortic atherosclerosis. No bone lesions. IMPRESSION: No edema or consolidation. Heart upper normal in size. Aortic Atherosclerosis (ICD10-I70.0). Electronically Signed   By: Lowella Grip III M.D.   On: 11/30/2018 09:31    Microbiology: Recent Results (from the past 240 hour(s))  SARS Coronavirus 2 (Hosp order,Performed in [facility] lab via [employer] ID)     Status: None   Collection Time: 11/30/18 10:12 AM   Specimen: Dry Nasal Swab (Abbott ID Now)  Result Value Ref Range Status   SARS Coronavirus 2 ([employer]ott ID Now) NEGATIVE NEGATIVE Final    Comment: (NOTE) Interpretive Result Comment(s): COVID 19 Positive SARS CoV 2  target nucleic acids are DETECTED. The SARS CoV 2 RNA is generally detectable in upper and lower respiratory specimens during the acute phase of infection.  Positive results are indicative of active infection with SARS CoV 2.  Clinical correlation with patient history and other diagnostic information is necessary to determine patient infection status.  Positive results do not rule out bacterial infection or coinfection with other viruses. The expected result is Negative. COVID 19 Negative SARS CoV 2 target nucleic acids are NOT DETECTED. The SARS CoV 2 RNA is generally detectable in upper and lower respiratory specimens during the acute phase of infection.  Negative results do not preclude SARS CoV 2 infection, do not rule out coinfections with other pathogens, and should not be used as the sole basis for treatment or other patient management decisions.  Negative results must be combined with clinical  observations, patient history, and epidemiological information. The expected result is Negative. Invalid Presence or absence of SARS CoV 2 nucleic acids cannot be determined. Repeat testing was performed on the submitted specimen and repeated Invalid results were obtained.  If clinically indicated, additional testing on a new specimen with an alternate test methodology [phone]) is advised.  The SARS CoV 2 RNA is generally detectable in upper and lower respiratory specimens during the acute phase of infection. The expected result is Negative. Fact Sheet for Patients:  GolfingFamily.no Fact Sheet for Healthcare Providers: https://www.hernandez-brewer.com/ This test is not yet approved or cleared by the [place] FDA and has been authorized for detection and/or diagnosis of SARS CoV 2 by FDA under an Emergency Use Authorization (EUA).  This EUA will remain in effect (meaning this test can be used) for the duration of the COVID19 d eclaration under  Section 564(b)(1) of the Act, 21 U.S.C. section 307 614 6536 3(b)(1), unless the authorization is terminated or revoked sooner. Performed at [facility], [address] [place], [place] [address]   Novel Coronavirus,NAA,(SEND-OUT TO REF LAB - TAT 24-48 hrs); Hosp Order     Status: None   Collection Time: 11/30/18  6:38 PM   Specimen: Nasopharyngeal Swab; Respiratory  Result Value Ref Range Status   SARS-CoV-2, NAA NOT DETECTED NOT DETECTED Final    Comment: (NOTE) Testing was performed using the cobas(R) SARS-CoV-2 test. This test was developed and its performance characteristics determined by [employer] and [employer]. This test has not been FDA cleared or approved. This test has been authorized by FDA under an Emergency Use Authorization (EUA). This test is  only authorized for the duration of time the declaration that circumstances exist justifying the authorization of the emergency use of in vitro diagnostic tests for detection of SARS-CoV-2 virus and/or diagnosis of COVID-19 infection under section 564(b)(1) of the Act, 21 U.S.C. 540JWJ-1(B)(1), unless the authorization is terminated or revoked sooner. When diagnostic testing is negative, the possibility of a false negative result should be considered in the context of a patient's recent exposures and the presence of clinical signs and symptoms consistent with COVID-19. An individual without symptoms of COVID-19 and who is not shedding SARS-CoV-2 virus would expect to have  a negative (not detected) result in this assay. Performed At: [facility] [address] [place], [place] [id number] Rush Farmer MD HY:[id number]    [place]  Final    Comment: Performed at [facility], [place] [address] [place], [place] [address]     Labs: Basic Metabolic Panel: Recent Labs  Lab 11/30/18 0915 12/01/18 0237 12/02/18 0541 12/03/18 0641  NA 140 139 138 139  K 4.1 3.9 3.9 3.5  CL 111 110  108 108  CO2 16* 19* 20* 21*  GLUCOSE 252* 194* 313* 191*  BUN 43* 46* 47* 41*  CREATININE 3.03* 3.22* 3.08* 3.04*  CALCIUM 8.5* 8.2* 8.1* 8.3*  MG  --  2.0  --   --    Liver Function Tests: Recent Labs  Lab 11/30/18 0915  AST 16  ALT 9  ALKPHOS 68  BILITOT 0.9  PROT 7.6  ALBUMIN 3.2*   No results for input(s): LIPASE, AMYLASE in the last 168 hours. No results for input(s): AMMONIA in the last 168 hours. CBC: Recent Labs  Lab 11/30/18 0915 12/01/18 0237 12/02/18 0541  WBC 8.5 6.6 6.3  NEUTROABS 6.4 4.4  --   HGB 10.6* 9.4* 9.4*  HCT 32.9* 28.5* 28.5*  MCV 82.9 81.2 81.2  PLT 273 249 231   Cardiac Enzymes: No results for input(s): CKTOTAL, CKMB, CKMBINDEX, TROPONINI in the last 168 hours. BNP: BNP (last 3 results) Recent Labs    11/30/18 0915  BNP 1,206.7*    ProBNP (last 3 results) No results for input(s): PROBNP in the last 8760 hours.  CBG: Recent Labs  Lab 12/02/18 1136 12/02/18 1619 12/02/18 2117 12/03/18 0612 12/03/18 1128  GLUCAP 278* 204* 153* 180* 262*       Signed:  Domenic Polite MD.  [facility] 12/03/2018, 2:34 PM

## 2018-12-04 ENCOUNTER — Other Ambulatory Visit: Payer: Self-pay | Admitting: Family Medicine

## 2018-12-04 ENCOUNTER — Telehealth: Payer: Self-pay

## 2018-12-04 DIAGNOSIS — N289 Disorder of kidney and ureter, unspecified: Secondary | ICD-10-CM

## 2018-12-04 DIAGNOSIS — I5031 Acute diastolic (congestive) heart failure: Secondary | ICD-10-CM

## 2018-12-04 NOTE — Telephone Encounter (Signed)
Copied from [place] [id number]. Topic: Referral - Request for Referral >> Dec 04, 2018  3:55 PM Alanda Slim E wrote: Has patient seen PCP for this complaint? Yes *If NO, is insurance requiring patient see PCP for this issue before PCP can refer them? Referral for which specialty: Kidney specialist  Preferred provider/office: [place] / [address] [phone] Reason for referral: Pt was diagnosed with congestive heart failure and kidneys are not functioning properly

## 2018-12-04 NOTE — Telephone Encounter (Signed)
I have placed referral

## 2018-12-10 DIAGNOSIS — D631 Anemia in chronic kidney disease: Secondary | ICD-10-CM | POA: Diagnosis not present

## 2018-12-10 DIAGNOSIS — E559 Vitamin D deficiency, unspecified: Secondary | ICD-10-CM | POA: Diagnosis not present

## 2018-12-10 DIAGNOSIS — I5032 Chronic diastolic (congestive) heart failure: Secondary | ICD-10-CM | POA: Diagnosis not present

## 2018-12-10 DIAGNOSIS — N184 Chronic kidney disease, stage 4 (severe): Secondary | ICD-10-CM | POA: Diagnosis not present

## 2018-12-10 DIAGNOSIS — I130 Hypertensive heart and chronic kidney disease with heart failure and stage 1 through stage 4 chronic kidney disease, or unspecified chronic kidney disease: Secondary | ICD-10-CM | POA: Diagnosis not present

## 2018-12-10 DIAGNOSIS — E11319 Type 2 diabetes mellitus with unspecified diabetic retinopathy without macular edema: Secondary | ICD-10-CM | POA: Diagnosis not present

## 2018-12-10 DIAGNOSIS — E1151 Type 2 diabetes mellitus with diabetic peripheral angiopathy without gangrene: Secondary | ICD-10-CM | POA: Diagnosis not present

## 2018-12-10 DIAGNOSIS — E785 Hyperlipidemia, unspecified: Secondary | ICD-10-CM | POA: Diagnosis not present

## 2018-12-10 DIAGNOSIS — E1140 Type 2 diabetes mellitus with diabetic neuropathy, unspecified: Secondary | ICD-10-CM | POA: Diagnosis not present

## 2018-12-11 DIAGNOSIS — I5032 Chronic diastolic (congestive) heart failure: Secondary | ICD-10-CM | POA: Diagnosis not present

## 2018-12-11 DIAGNOSIS — E1140 Type 2 diabetes mellitus with diabetic neuropathy, unspecified: Secondary | ICD-10-CM | POA: Diagnosis not present

## 2018-12-11 DIAGNOSIS — E1151 Type 2 diabetes mellitus with diabetic peripheral angiopathy without gangrene: Secondary | ICD-10-CM | POA: Diagnosis not present

## 2018-12-11 DIAGNOSIS — D631 Anemia in chronic kidney disease: Secondary | ICD-10-CM | POA: Diagnosis not present

## 2018-12-11 DIAGNOSIS — N184 Chronic kidney disease, stage 4 (severe): Secondary | ICD-10-CM | POA: Diagnosis not present

## 2018-12-11 DIAGNOSIS — E11319 Type 2 diabetes mellitus with unspecified diabetic retinopathy without macular edema: Secondary | ICD-10-CM | POA: Diagnosis not present

## 2018-12-11 DIAGNOSIS — I130 Hypertensive heart and chronic kidney disease with heart failure and stage 1 through stage 4 chronic kidney disease, or unspecified chronic kidney disease: Secondary | ICD-10-CM | POA: Diagnosis not present

## 2018-12-11 DIAGNOSIS — E559 Vitamin D deficiency, unspecified: Secondary | ICD-10-CM | POA: Diagnosis not present

## 2018-12-11 DIAGNOSIS — E785 Hyperlipidemia, unspecified: Secondary | ICD-10-CM | POA: Diagnosis not present

## 2018-12-12 ENCOUNTER — Other Ambulatory Visit: Payer: Self-pay

## 2018-12-12 DIAGNOSIS — I739 Peripheral vascular disease, unspecified: Secondary | ICD-10-CM | POA: Diagnosis not present

## 2018-12-12 DIAGNOSIS — D631 Anemia in chronic kidney disease: Secondary | ICD-10-CM | POA: Diagnosis not present

## 2018-12-12 DIAGNOSIS — E1122 Type 2 diabetes mellitus with diabetic chronic kidney disease: Secondary | ICD-10-CM | POA: Diagnosis not present

## 2018-12-12 DIAGNOSIS — E1140 Type 2 diabetes mellitus with diabetic neuropathy, unspecified: Secondary | ICD-10-CM | POA: Diagnosis not present

## 2018-12-12 DIAGNOSIS — N2581 Secondary hyperparathyroidism of renal origin: Secondary | ICD-10-CM | POA: Diagnosis not present

## 2018-12-12 DIAGNOSIS — I5031 Acute diastolic (congestive) heart failure: Secondary | ICD-10-CM | POA: Diagnosis not present

## 2018-12-12 DIAGNOSIS — N189 Chronic kidney disease, unspecified: Secondary | ICD-10-CM | POA: Diagnosis not present

## 2018-12-12 DIAGNOSIS — N184 Chronic kidney disease, stage 4 (severe): Secondary | ICD-10-CM | POA: Diagnosis not present

## 2018-12-13 ENCOUNTER — Inpatient Hospital Stay: Payer: Medicare HMO | Admitting: Family Medicine

## 2018-12-13 DIAGNOSIS — N390 Urinary tract infection, site not specified: Secondary | ICD-10-CM | POA: Diagnosis not present

## 2018-12-16 ENCOUNTER — Other Ambulatory Visit: Payer: Self-pay

## 2018-12-16 ENCOUNTER — Ambulatory Visit: Payer: Medicare HMO | Admitting: Endocrinology

## 2018-12-16 ENCOUNTER — Encounter: Payer: Self-pay | Admitting: Endocrinology

## 2018-12-16 VITALS — BP 100/60 | HR 78 | Ht 69.0 in | Wt 170.0 lb

## 2018-12-16 DIAGNOSIS — N184 Chronic kidney disease, stage 4 (severe): Secondary | ICD-10-CM

## 2018-12-16 DIAGNOSIS — E1122 Type 2 diabetes mellitus with diabetic chronic kidney disease: Secondary | ICD-10-CM | POA: Diagnosis not present

## 2018-12-16 DIAGNOSIS — Z794 Long term (current) use of insulin: Secondary | ICD-10-CM

## 2018-12-16 MED ORDER — NOVOLIN 70/30 RELION (70-30) 100 UNIT/ML ~~LOC~~ SUSP: SUBCUTANEOUS | 2 refills | Status: AC
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
## 2018-12-16 NOTE — Patient Instructions (Addendum)
check your blood sugar twice a day.  vary the time of day when you check, between before the 3 meals, and at bedtime.  also check if you have symptoms of your blood sugar being too high or too low.  please keep a record of the readings and bring it to your next appointment here (or you can bring the meter itself).  You can write it on any piece of paper.  please call us sooner if your blood sugar goes below 70, or if you have a lot of readings over 200.   Please increase the 70/30 insulin to 36 units with breakfast, and 8 units with the evening meal.  Please take this, no matter what your blood sugar is.   On this type of insulin schedule, you should eat meals on a regular schedule (especially lunch).  If a meal is missed or significantly delayed, your blood sugar could go low.  Please come back for a follow-up appointment in 2 months.

## 2018-12-17 ENCOUNTER — Ambulatory Visit (INDEPENDENT_AMBULATORY_CARE_PROVIDER_SITE_OTHER): Payer: Medicare HMO | Admitting: Family Medicine

## 2018-12-17 DIAGNOSIS — E782 Mixed hyperlipidemia: Secondary | ICD-10-CM | POA: Diagnosis not present

## 2018-12-17 DIAGNOSIS — E559 Vitamin D deficiency, unspecified: Secondary | ICD-10-CM

## 2018-12-17 DIAGNOSIS — N183 Chronic kidney disease, stage 3 unspecified: Secondary | ICD-10-CM

## 2018-12-17 DIAGNOSIS — Z794 Long term (current) use of insulin: Secondary | ICD-10-CM

## 2018-12-17 DIAGNOSIS — I10 Essential (primary) hypertension: Secondary | ICD-10-CM

## 2018-12-17 DIAGNOSIS — D649 Anemia, unspecified: Secondary | ICD-10-CM | POA: Diagnosis not present

## 2018-12-17 DIAGNOSIS — E1122 Type 2 diabetes mellitus with diabetic chronic kidney disease: Secondary | ICD-10-CM | POA: Diagnosis not present

## 2018-12-17 DIAGNOSIS — I5031 Acute diastolic (congestive) heart failure: Secondary | ICD-10-CM | POA: Diagnosis not present

## 2018-12-17 DIAGNOSIS — N289 Disorder of kidney and ureter, unspecified: Secondary | ICD-10-CM | POA: Diagnosis not present

## 2018-12-17 NOTE — Patient Instructions (Signed)

## 2018-12-18 LAB — CBC
HCT: 33.9 % — ABNORMAL LOW (ref 39.0–52.0)
Hemoglobin: 11.2 g/dL — ABNORMAL LOW (ref 13.0–17.0)
MCHC: 33 g/dL (ref 30.0–36.0)
MCV: 83 fl (ref 78.0–100.0)
Platelets: 299 10*3/uL (ref 150.0–400.0)
RBC: 4.09 Mil/uL — ABNORMAL LOW (ref 4.22–5.81)
RDW: 13.7 % (ref 11.5–15.5)
WBC: 6.4 10*3/uL (ref 4.0–10.5)

## 2018-12-18 LAB — LIPID PANEL
Cholesterol: 160 mg/dL (ref 0–200)
HDL: 23.9 mg/dL — ABNORMAL LOW (ref >39.00)
LDL Cholesterol: 109 mg/dL — ABNORMAL HIGH (ref 0–99)
NonHDL: 136.45
Total CHOL/HDL Ratio: 7
Triglycerides: 136 mg/dL (ref 0.0–149.0)
VLDL: 27.2 mg/dL (ref 0.0–40.0)

## 2018-12-18 LAB — COMPREHENSIVE METABOLIC PANEL
ALT: 10 U/L (ref 0–53)
AST: 12 U/L (ref 0–37)
Albumin: 3.5 g/dL (ref 3.5–5.2)
Alkaline Phosphatase: 77 U/L (ref 39–117)
BUN: 48 mg/dL — ABNORMAL HIGH (ref 6–23)
CO2: 25 mEq/L (ref 19–32)
Calcium: 8.9 mg/dL (ref 8.4–10.5)
Chloride: 103 mEq/L (ref 96–112)
Creatinine, Ser: 3 mg/dL — ABNORMAL HIGH (ref 0.40–1.50)
GFR: 24.76 mL/min — ABNORMAL LOW (ref >60.00)
Glucose, Bld: 129 mg/dL — ABNORMAL HIGH (ref 70–99)
Potassium: 4.7 mEq/L (ref 3.5–5.1)
Sodium: 138 mEq/L (ref 135–145)
Total Bilirubin: 0.5 mg/dL (ref 0.2–1.2)
Total Protein: 7.4 g/dL (ref 6.0–8.3)

## 2018-12-18 LAB — TSH: TSH: 1.42 u[IU]/mL (ref 0.35–4.50)

## 2018-12-18 NOTE — Assessment & Plan Note (Signed)
Tolerating statin, encouraged heart healthy diet, avoid trans fats, minimize simple carbs and saturated fats. Increase exercise as tolerated 

## 2018-12-18 NOTE — Assessment & Plan Note (Signed)
Is now following with Dr Justin Mend and [facility]. Hydrate and monitor
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
## 2018-12-18 NOTE — Assessment & Plan Note (Signed)
Monitor and supplement 

## 2018-12-18 NOTE — Assessment & Plan Note (Signed)
Has an appointment with cardiology next week and is doing well.

## 2018-12-19 ENCOUNTER — Ambulatory Visit: Payer: Medicare HMO | Admitting: Cardiovascular Disease

## 2018-12-19 DIAGNOSIS — E1140 Type 2 diabetes mellitus with diabetic neuropathy, unspecified: Secondary | ICD-10-CM | POA: Diagnosis not present

## 2018-12-19 DIAGNOSIS — E559 Vitamin D deficiency, unspecified: Secondary | ICD-10-CM | POA: Diagnosis not present

## 2018-12-19 DIAGNOSIS — E785 Hyperlipidemia, unspecified: Secondary | ICD-10-CM | POA: Diagnosis not present

## 2018-12-19 DIAGNOSIS — I130 Hypertensive heart and chronic kidney disease with heart failure and stage 1 through stage 4 chronic kidney disease, or unspecified chronic kidney disease: Secondary | ICD-10-CM | POA: Diagnosis not present

## 2018-12-19 DIAGNOSIS — N184 Chronic kidney disease, stage 4 (severe): Secondary | ICD-10-CM | POA: Diagnosis not present

## 2018-12-19 DIAGNOSIS — E1151 Type 2 diabetes mellitus with diabetic peripheral angiopathy without gangrene: Secondary | ICD-10-CM | POA: Diagnosis not present

## 2018-12-19 DIAGNOSIS — I5032 Chronic diastolic (congestive) heart failure: Secondary | ICD-10-CM | POA: Diagnosis not present

## 2018-12-19 DIAGNOSIS — D631 Anemia in chronic kidney disease: Secondary | ICD-10-CM | POA: Diagnosis not present

## 2018-12-19 DIAGNOSIS — E11319 Type 2 diabetes mellitus with unspecified diabetic retinopathy without macular edema: Secondary | ICD-10-CM | POA: Diagnosis not present

## 2018-12-23 ENCOUNTER — Telehealth: Payer: Self-pay | Admitting: Cardiovascular Disease

## 2018-12-23 DIAGNOSIS — E11319 Type 2 diabetes mellitus with unspecified diabetic retinopathy without macular edema: Secondary | ICD-10-CM | POA: Diagnosis not present

## 2018-12-23 DIAGNOSIS — D631 Anemia in chronic kidney disease: Secondary | ICD-10-CM | POA: Diagnosis not present

## 2018-12-23 DIAGNOSIS — E1140 Type 2 diabetes mellitus with diabetic neuropathy, unspecified: Secondary | ICD-10-CM | POA: Diagnosis not present

## 2018-12-23 DIAGNOSIS — E559 Vitamin D deficiency, unspecified: Secondary | ICD-10-CM | POA: Diagnosis not present

## 2018-12-23 DIAGNOSIS — N184 Chronic kidney disease, stage 4 (severe): Secondary | ICD-10-CM | POA: Diagnosis not present

## 2018-12-23 DIAGNOSIS — I5032 Chronic diastolic (congestive) heart failure: Secondary | ICD-10-CM | POA: Diagnosis not present

## 2018-12-23 DIAGNOSIS — E1151 Type 2 diabetes mellitus with diabetic peripheral angiopathy without gangrene: Secondary | ICD-10-CM | POA: Diagnosis not present

## 2018-12-23 DIAGNOSIS — E785 Hyperlipidemia, unspecified: Secondary | ICD-10-CM | POA: Diagnosis not present

## 2018-12-23 DIAGNOSIS — I130 Hypertensive heart and chronic kidney disease with heart failure and stage 1 through stage 4 chronic kidney disease, or unspecified chronic kidney disease: Secondary | ICD-10-CM | POA: Diagnosis not present

## 2018-12-23 NOTE — Telephone Encounter (Signed)
Follow up     Pt says his wife will need to come with him for assistance with walking. Pts wife answered NO to all covid questions

## 2018-12-23 NOTE — Telephone Encounter (Signed)

## 2018-12-24 ENCOUNTER — Other Ambulatory Visit: Payer: Self-pay

## 2018-12-24 ENCOUNTER — Encounter: Payer: Self-pay | Admitting: Cardiovascular Disease

## 2018-12-24 ENCOUNTER — Ambulatory Visit (INDEPENDENT_AMBULATORY_CARE_PROVIDER_SITE_OTHER): Payer: Medicare HMO | Admitting: Cardiovascular Disease

## 2018-12-24 VITALS — BP 116/62 | HR 79 | Ht 69.0 in | Wt 171.0 lb

## 2018-12-24 DIAGNOSIS — I5032 Chronic diastolic (congestive) heart failure: Secondary | ICD-10-CM

## 2018-12-24 DIAGNOSIS — I10 Essential (primary) hypertension: Secondary | ICD-10-CM

## 2018-12-24 NOTE — Patient Instructions (Addendum)
Medication Instructions:  Your physician has recommended you make the following change in your medication:  STOP Hydralazine (Apresoline)  If you need a refill on your cardiac medications before your next appointment, please call your pharmacy.    Lab work: None Ordered    Testing/Procedures: None Ordered    Follow-Up: Your physician recommends that you return for a follow-up appointment in: 3 months on Tuesday October 27 at 11:20 am with Dr. Acie Fredrickson
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
## 2018-12-25 DIAGNOSIS — E785 Hyperlipidemia, unspecified: Secondary | ICD-10-CM | POA: Diagnosis not present

## 2018-12-25 DIAGNOSIS — I130 Hypertensive heart and chronic kidney disease with heart failure and stage 1 through stage 4 chronic kidney disease, or unspecified chronic kidney disease: Secondary | ICD-10-CM | POA: Diagnosis not present

## 2018-12-25 DIAGNOSIS — N184 Chronic kidney disease, stage 4 (severe): Secondary | ICD-10-CM | POA: Diagnosis not present

## 2018-12-25 DIAGNOSIS — E1140 Type 2 diabetes mellitus with diabetic neuropathy, unspecified: Secondary | ICD-10-CM | POA: Diagnosis not present

## 2018-12-25 DIAGNOSIS — D631 Anemia in chronic kidney disease: Secondary | ICD-10-CM | POA: Diagnosis not present

## 2018-12-25 DIAGNOSIS — E1151 Type 2 diabetes mellitus with diabetic peripheral angiopathy without gangrene: Secondary | ICD-10-CM | POA: Diagnosis not present

## 2018-12-25 DIAGNOSIS — I5032 Chronic diastolic (congestive) heart failure: Secondary | ICD-10-CM | POA: Diagnosis not present

## 2018-12-25 DIAGNOSIS — E559 Vitamin D deficiency, unspecified: Secondary | ICD-10-CM | POA: Diagnosis not present

## 2018-12-25 DIAGNOSIS — E11319 Type 2 diabetes mellitus with unspecified diabetic retinopathy without macular edema: Secondary | ICD-10-CM | POA: Diagnosis not present

## 2018-12-31 DIAGNOSIS — E559 Vitamin D deficiency, unspecified: Secondary | ICD-10-CM | POA: Diagnosis not present

## 2018-12-31 DIAGNOSIS — I5032 Chronic diastolic (congestive) heart failure: Secondary | ICD-10-CM | POA: Diagnosis not present

## 2018-12-31 DIAGNOSIS — E1151 Type 2 diabetes mellitus with diabetic peripheral angiopathy without gangrene: Secondary | ICD-10-CM | POA: Diagnosis not present

## 2018-12-31 DIAGNOSIS — I130 Hypertensive heart and chronic kidney disease with heart failure and stage 1 through stage 4 chronic kidney disease, or unspecified chronic kidney disease: Secondary | ICD-10-CM | POA: Diagnosis not present

## 2018-12-31 DIAGNOSIS — E11319 Type 2 diabetes mellitus with unspecified diabetic retinopathy without macular edema: Secondary | ICD-10-CM | POA: Diagnosis not present

## 2018-12-31 DIAGNOSIS — D631 Anemia in chronic kidney disease: Secondary | ICD-10-CM | POA: Diagnosis not present

## 2018-12-31 DIAGNOSIS — E785 Hyperlipidemia, unspecified: Secondary | ICD-10-CM | POA: Diagnosis not present

## 2018-12-31 DIAGNOSIS — N184 Chronic kidney disease, stage 4 (severe): Secondary | ICD-10-CM | POA: Diagnosis not present

## 2018-12-31 DIAGNOSIS — E1140 Type 2 diabetes mellitus with diabetic neuropathy, unspecified: Secondary | ICD-10-CM | POA: Diagnosis not present
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
## 2019-01-03 ENCOUNTER — Other Ambulatory Visit: Payer: Self-pay

## 2019-01-03 ENCOUNTER — Ambulatory Visit: Payer: Medicare HMO | Admitting: *Deleted

## 2019-01-08 ENCOUNTER — Other Ambulatory Visit: Payer: Self-pay | Admitting: Cardiovascular Disease

## 2019-01-08 MED ORDER — TORSEMIDE 20 MG PO TABS: 40.0000 mg | ORAL_TABLET | Freq: Every day | ORAL | 3 refills | Status: AC

## 2019-01-08 MED ORDER — ISOSORBIDE MONONITRATE ER 30 MG PO TB24: 30.0000 mg | ORAL_TABLET | Freq: Every day | ORAL | 3 refills | Status: AC

## 2019-01-08 NOTE — Telephone Encounter (Signed)
Pt's medications were sent to pt's pharmacy as requested. Confirmation received.  

## 2019-01-12 ENCOUNTER — Encounter (HOSPITAL_BASED_OUTPATIENT_CLINIC_OR_DEPARTMENT_OTHER): Payer: Self-pay | Admitting: Emergency Medicine

## 2019-01-12 ENCOUNTER — Other Ambulatory Visit: Payer: Self-pay

## 2019-01-12 ENCOUNTER — Emergency Department (HOSPITAL_BASED_OUTPATIENT_CLINIC_OR_DEPARTMENT_OTHER)
Admission: EM | Admit: 2019-01-12 | Discharge: 2019-01-13 | Disposition: A | Discharge status: Home / Self Care | Payer: Medicare HMO | Attending: Emergency Medicine | Admitting: Emergency Medicine

## 2019-01-12 DIAGNOSIS — R112 Nausea with vomiting, unspecified: Secondary | ICD-10-CM

## 2019-01-12 DIAGNOSIS — I130 Hypertensive heart and chronic kidney disease with heart failure and stage 1 through stage 4 chronic kidney disease, or unspecified chronic kidney disease: Secondary | ICD-10-CM | POA: Insufficient documentation

## 2019-01-12 DIAGNOSIS — E1122 Type 2 diabetes mellitus with diabetic chronic kidney disease: Secondary | ICD-10-CM | POA: Diagnosis not present

## 2019-01-12 DIAGNOSIS — N189 Chronic kidney disease, unspecified: Secondary | ICD-10-CM | POA: Insufficient documentation

## 2019-01-12 DIAGNOSIS — E1165 Type 2 diabetes mellitus with hyperglycemia: Secondary | ICD-10-CM | POA: Diagnosis not present

## 2019-01-12 DIAGNOSIS — I5032 Chronic diastolic (congestive) heart failure: Secondary | ICD-10-CM | POA: Diagnosis not present

## 2019-01-12 DIAGNOSIS — E860 Dehydration: Secondary | ICD-10-CM

## 2019-01-12 DIAGNOSIS — Z79899 Other long term (current) drug therapy: Secondary | ICD-10-CM | POA: Diagnosis not present

## 2019-01-12 DIAGNOSIS — Z87891 Personal history of nicotine dependence: Secondary | ICD-10-CM | POA: Insufficient documentation

## 2019-01-12 DIAGNOSIS — R42 Dizziness and giddiness: Secondary | ICD-10-CM | POA: Diagnosis not present

## 2019-01-12 DIAGNOSIS — R195 Other fecal abnormalities: Secondary | ICD-10-CM | POA: Diagnosis not present

## 2019-01-12 DIAGNOSIS — Z7982 Long term (current) use of aspirin: Secondary | ICD-10-CM | POA: Insufficient documentation

## 2019-01-12 DIAGNOSIS — Z794 Long term (current) use of insulin: Secondary | ICD-10-CM | POA: Diagnosis not present

## 2019-01-12 DIAGNOSIS — R1110 Vomiting, unspecified: Secondary | ICD-10-CM | POA: Diagnosis not present

## 2019-01-12 DIAGNOSIS — R739 Hyperglycemia, unspecified: Secondary | ICD-10-CM

## 2019-01-12 MED ORDER — ONDANSETRON HCL 4 MG/2ML IJ SOLN
4.0000 mg | Freq: Once | INTRAMUSCULAR | Status: AC
  Administered 2019-01-13: 4 mg via INTRAVENOUS
  Filled 2019-01-12: qty 2

## 2019-01-12 MED ORDER — SODIUM CHLORIDE 0.9 % IV BOLUS
500.0000 mL | Freq: Once | INTRAVENOUS | Status: AC
  Administered 2019-01-13: 500 mL via INTRAVENOUS

## 2019-01-12 NOTE — ED Triage Notes (Signed)
Pt states that he has been vomiting a lot of mucous since yesterday. He states he feels weak all over now and some dizziness. He denies fever, diarrhea. Cp, or new shortness of breath.

## 2019-01-13 ENCOUNTER — Emergency Department (HOSPITAL_BASED_OUTPATIENT_CLINIC_OR_DEPARTMENT_OTHER): Payer: Medicare HMO

## 2019-01-13 DIAGNOSIS — R1110 Vomiting, unspecified: Secondary | ICD-10-CM | POA: Diagnosis not present

## 2019-01-13 DIAGNOSIS — R195 Other fecal abnormalities: Secondary | ICD-10-CM | POA: Diagnosis not present

## 2019-01-13 LAB — CBG MONITORING, ED: Glucose-Capillary: 352 mg/dL — ABNORMAL HIGH (ref 70–99)

## 2019-01-13 LAB — CBC WITH DIFFERENTIAL/PLATELET
Abs Immature Granulocytes: 0.02 10*3/uL (ref 0.00–0.07)
Basophils Absolute: 0 10*3/uL (ref 0.0–0.1)
Basophils Relative: 0 %
Eosinophils Absolute: 0.2 10*3/uL (ref 0.0–0.5)
Eosinophils Relative: 2 %
HCT: 35.1 % — ABNORMAL LOW (ref 39.0–52.0)
Hemoglobin: 11.8 g/dL — ABNORMAL LOW (ref 13.0–17.0)
Immature Granulocytes: 0 %
Lymphocytes Relative: 8 %
Lymphs Abs: 1 10*3/uL (ref 0.7–4.0)
MCH: 27.1 pg (ref 26.0–34.0)
MCHC: 33.6 g/dL (ref 30.0–36.0)
MCV: 80.7 fL (ref 80.0–100.0)
Monocytes Absolute: 0.5 10*3/uL (ref 0.1–1.0)
Monocytes Relative: 5 %
Neutro Abs: 10.3 10*3/uL — ABNORMAL HIGH (ref 1.7–7.7)
Neutrophils Relative %: 85 %
Platelets: 267 10*3/uL (ref 150–400)
RBC: 4.35 MIL/uL (ref 4.22–5.81)
RDW: 12.4 % (ref 11.5–15.5)
WBC: 12.1 10*3/uL — ABNORMAL HIGH (ref 4.0–10.5)
nRBC: 0 % (ref 0.0–0.2)

## 2019-01-13 LAB — URINALYSIS, ROUTINE W REFLEX MICROSCOPIC
Bilirubin Urine: NEGATIVE
Glucose, UA: >=500 mg/dL — AB
Ketones, ur: NEGATIVE mg/dL
Leukocytes,Ua: NEGATIVE
Nitrite: NEGATIVE
Protein, ur: 100 mg/dL — AB
Specific Gravity, Urine: 1.015 (ref 1.005–1.030)
pH: 5.5 (ref 5.0–8.0)

## 2019-01-13 LAB — COMPREHENSIVE METABOLIC PANEL
ALT: 14 U/L (ref 0–44)
AST: 17 U/L (ref 15–41)
Albumin: 3.5 g/dL (ref 3.5–5.0)
Alkaline Phosphatase: 77 U/L (ref 38–126)
Anion gap: 13 (ref 5–15)
BUN: 47 mg/dL — ABNORMAL HIGH (ref 8–23)
CO2: 21 mmol/L — ABNORMAL LOW (ref 22–32)
Calcium: 9.4 mg/dL (ref 8.9–10.3)
Chloride: 100 mmol/L (ref 98–111)
Creatinine, Ser: 2.78 mg/dL — ABNORMAL HIGH (ref 0.61–1.24)
GFR calc Af Amer: 25 mL/min — ABNORMAL LOW (ref >60)
GFR calc non Af Amer: 21 mL/min — ABNORMAL LOW (ref >60)
Glucose, Bld: 351 mg/dL — ABNORMAL HIGH (ref 70–99)
Potassium: 4.1 mmol/L (ref 3.5–5.1)
Sodium: 134 mmol/L — ABNORMAL LOW (ref 135–145)
Total Bilirubin: 0.7 mg/dL (ref 0.3–1.2)
Total Protein: 8 g/dL (ref 6.5–8.1)

## 2019-01-13 LAB — URINALYSIS, MICROSCOPIC (REFLEX)

## 2019-01-13 LAB — LIPASE, BLOOD: Lipase: 40 U/L (ref 11–51)

## 2019-01-13 IMAGING — DX DG ABDOMEN ACUTE W/ 1V CHEST
5 series · 5 of 5 positions shown · non-contrast
Comparison: Chest CT [DATE].

CLINICAL DATA: 77-year-old male with vomiting dizziness and
weakness.

EXAM:
DG ABDOMEN ACUTE W/ 1V CHEST

[abdomen erect (1 of 2)]
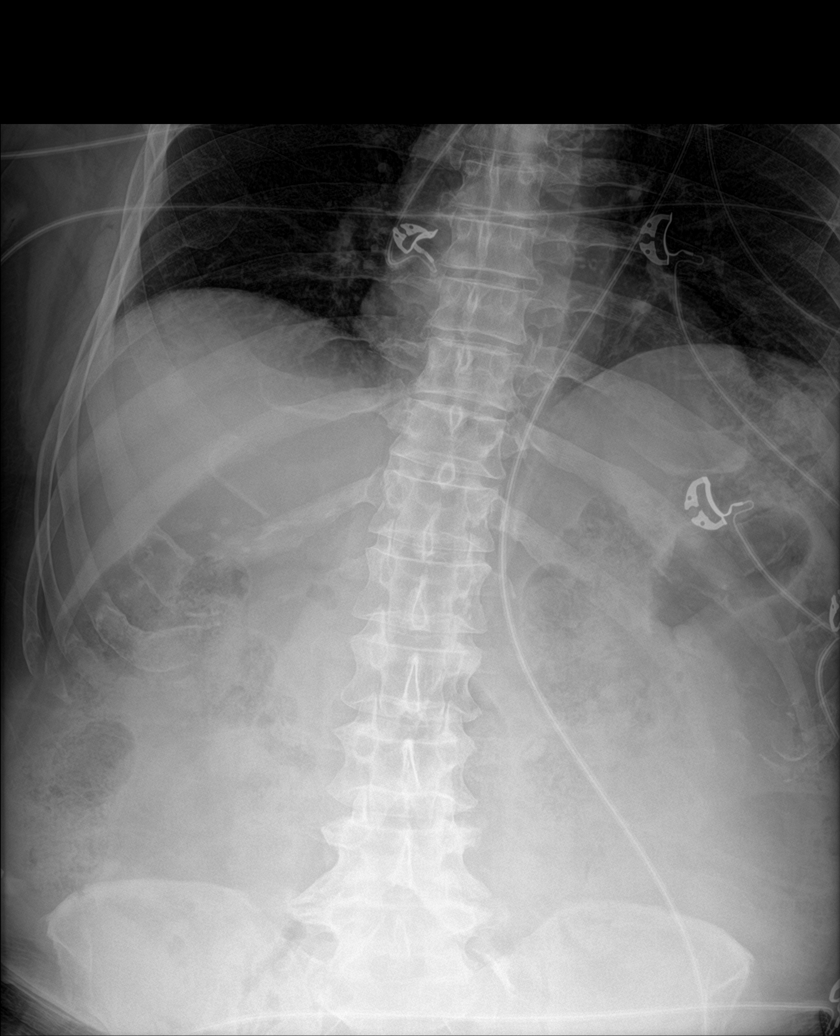

[abdomen supine]
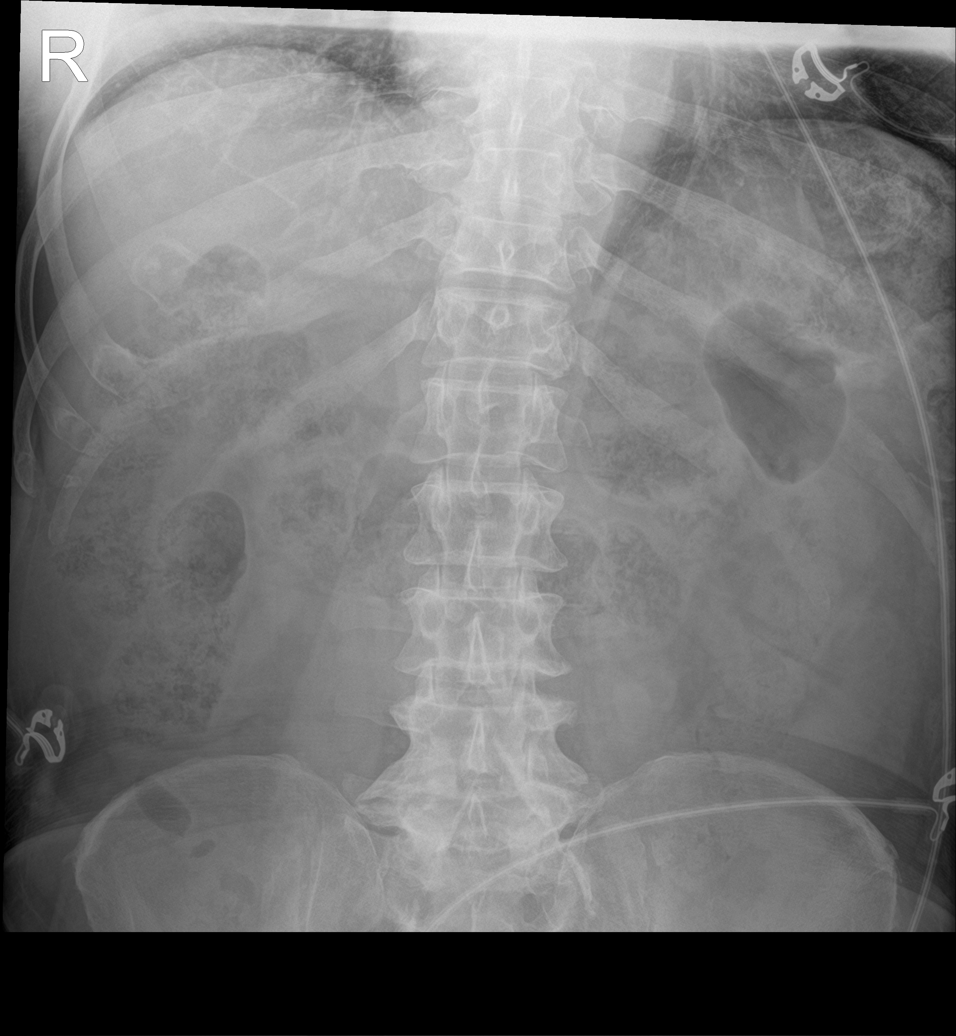

[chest ap]
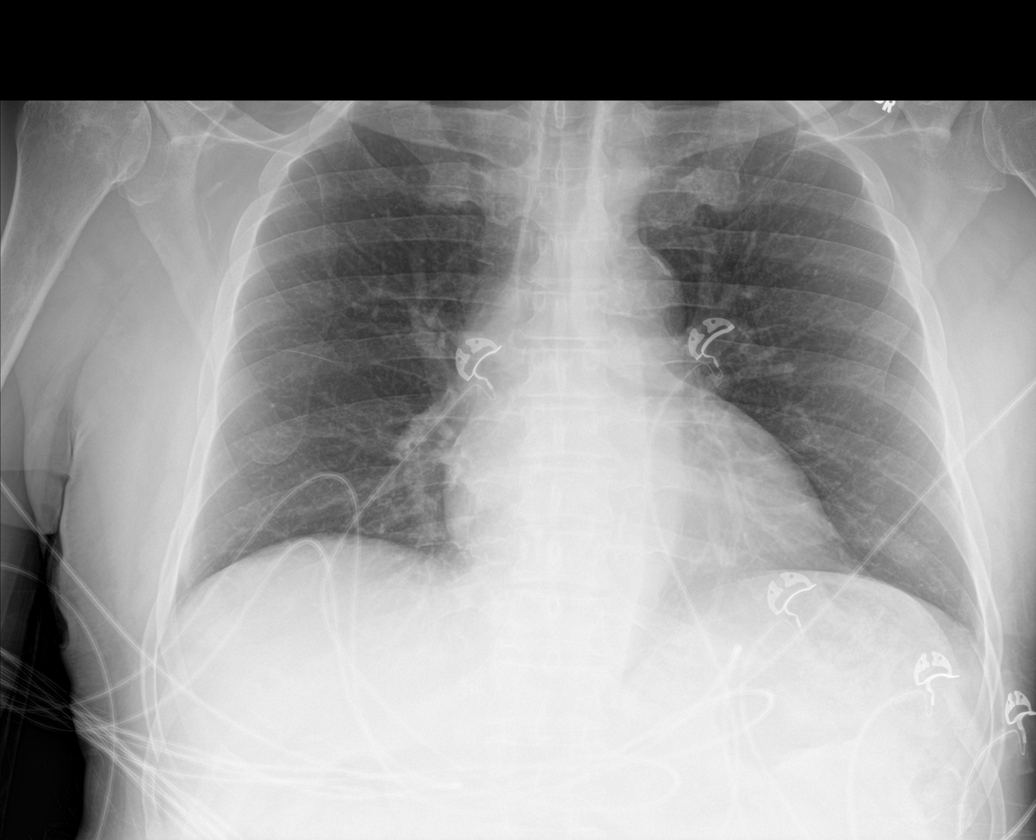

[abdomen erect (2 of 2)]
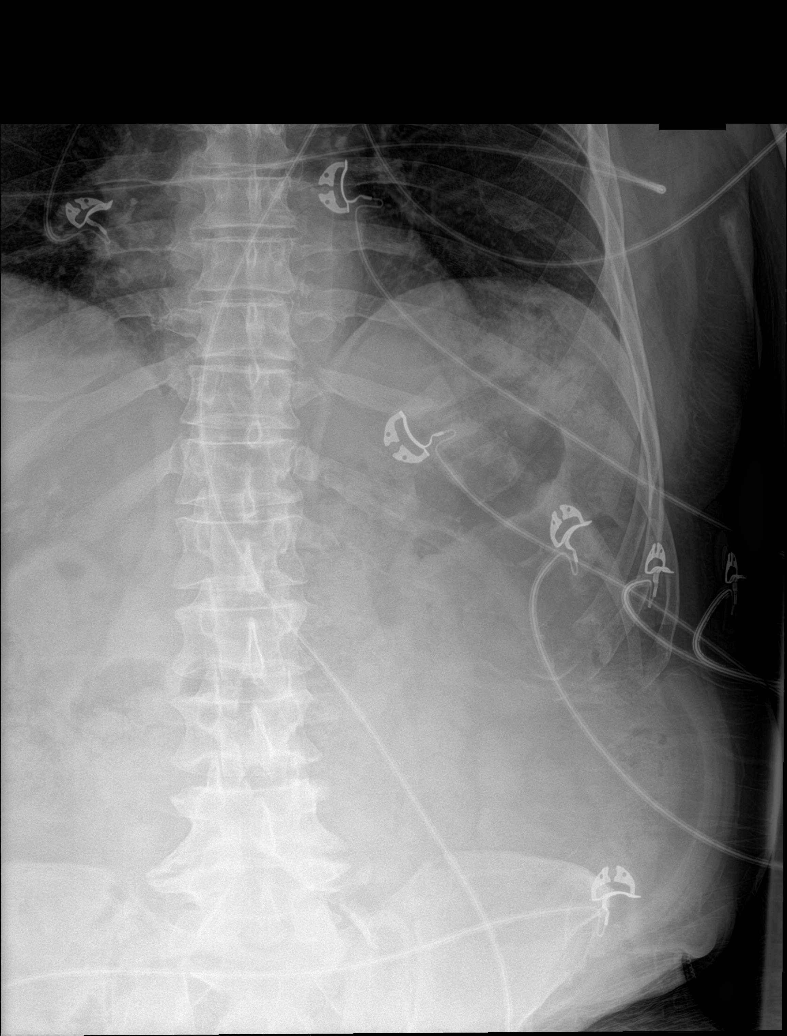

[abdomen kub]
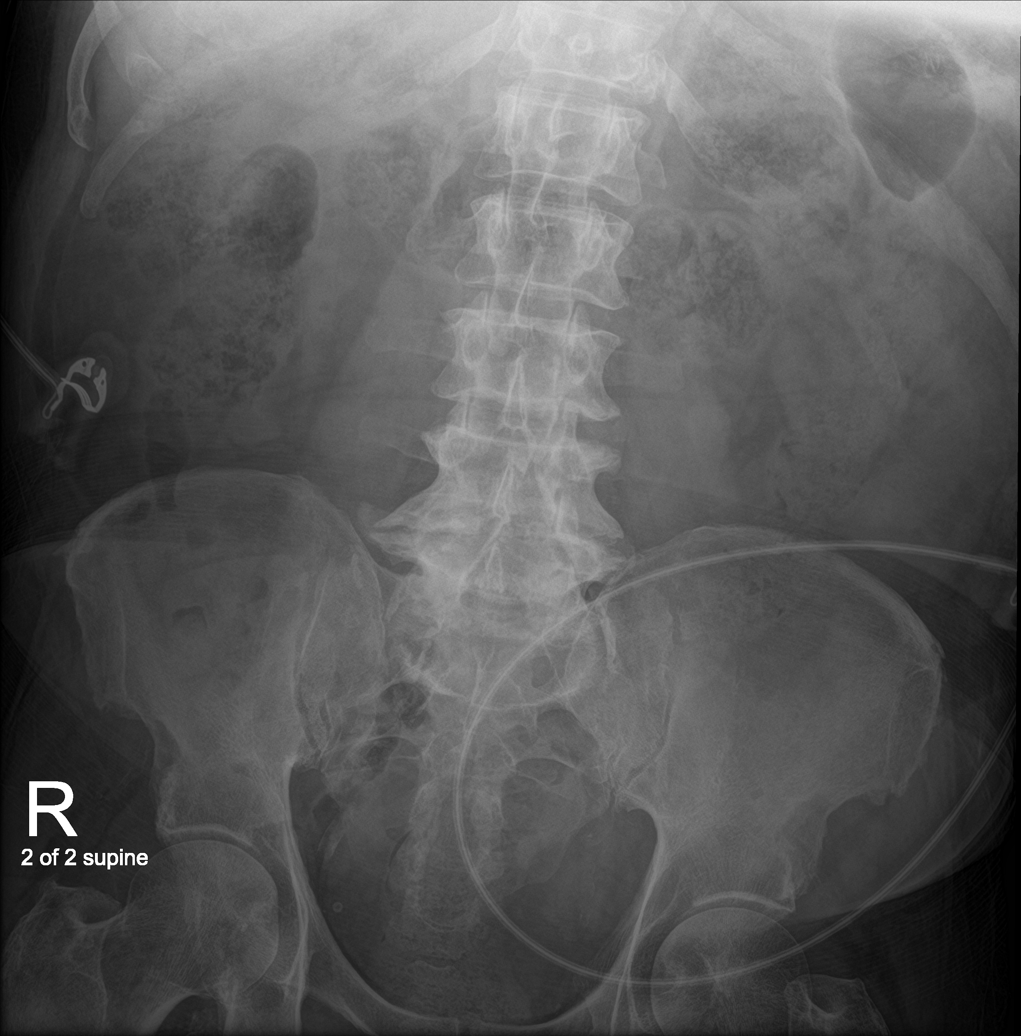

[5 of 5 positions shown; findings below may reference images not displayed]

FINDINGS: Semi upright AP view of the chest at [3V] hours. Stable lung
volumes. Normal cardiac size and mediastinal contours. Visualized
tracheal air column is within normal limits. Both lungs appear
clear. No pneumothorax or pleural effusion.

Upright and supine views of the abdomen. No pneumoperitoneum. Non
obstructed bowel gas pattern. Retained stool in the colon, moderate.
Other abdominal and pelvic visceral contours appear normal. Bulky
lower lumbar endplate spurring. No acute osseous abnormality
identified. Right hemipelvis phlebolith.
IMPRESSION: 1. Nonobstructed bowel-gas pattern with retained stool in the colon.
No free air.
2. No acute cardiopulmonary abnormality.

## 2019-01-13 MED ORDER — ONDANSETRON 4 MG PO TBDP: 4.0000 mg | ORAL_TABLET | Freq: Three times a day (TID) | ORAL | 0 refills | Status: DC | PRN

## 2019-01-13 NOTE — Discharge Instructions (Addendum)
You were seen today for vomiting, high blood sugar, dizziness.  Your work-up is largely reassuring.  You may have been slightly dehydrated.  You did have blood sugars in the 300s range.  Monitor closely at home and continue your medications.  Follow-up closely with your primary physician for recheck and adjustment of your insulin.

## 2019-01-13 NOTE — ED Notes (Signed)
Pt understood dc material. NAD noted. Script given at [employer]. All questions answered to satisfaction. Pt and family member escorted to checkout window.

## 2019-01-13 NOTE — ED Notes (Signed)
Pt given a Sprite Zero for PO challenge

## 2019-01-13 NOTE — ED Notes (Signed)
Pt and family notified and aware that we need a urine sample

## 2019-01-13 NOTE — ED Notes (Signed)
Pt tolerated PO fluids

## 2019-01-13 NOTE — ED Notes (Signed)
ED Provider at bedside. 

## 2019-01-13 NOTE — ED Notes (Signed)
Patient transported to XR. 

## 2019-01-13 NOTE — ED Notes (Signed)
Pt was still unable to give urine sample. EDP made aware and stated we could in and out cath. Primary RN was supervised and cath was unsuccessful due to patients foreskin. Unable to retract patients foreskin. EDP made aware
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
## 2019-01-16 ENCOUNTER — Other Ambulatory Visit: Payer: Self-pay

## 2019-01-16 ENCOUNTER — Ambulatory Visit (INDEPENDENT_AMBULATORY_CARE_PROVIDER_SITE_OTHER): Payer: Medicare HMO | Admitting: Family Medicine

## 2019-01-16 DIAGNOSIS — R112 Nausea with vomiting, unspecified: Secondary | ICD-10-CM | POA: Insufficient documentation

## 2019-01-16 DIAGNOSIS — E559 Vitamin D deficiency, unspecified: Secondary | ICD-10-CM | POA: Diagnosis not present

## 2019-01-16 DIAGNOSIS — Z794 Long term (current) use of insulin: Secondary | ICD-10-CM

## 2019-01-16 DIAGNOSIS — I10 Essential (primary) hypertension: Secondary | ICD-10-CM | POA: Diagnosis not present

## 2019-01-16 DIAGNOSIS — E782 Mixed hyperlipidemia: Secondary | ICD-10-CM | POA: Diagnosis not present

## 2019-01-16 DIAGNOSIS — E1122 Type 2 diabetes mellitus with diabetic chronic kidney disease: Secondary | ICD-10-CM | POA: Diagnosis not present

## 2019-01-16 DIAGNOSIS — N289 Disorder of kidney and ureter, unspecified: Secondary | ICD-10-CM

## 2019-01-16 DIAGNOSIS — N184 Chronic kidney disease, stage 4 (severe): Secondary | ICD-10-CM | POA: Diagnosis not present

## 2019-01-16 MED ORDER — CARVEDILOL 25 MG PO TABS: 25.0000 mg | ORAL_TABLET | Freq: Two times a day (BID) | ORAL | 1 refills | Status: AC

## 2019-01-16 MED ORDER — ONDANSETRON 4 MG PO TBDP: 4.0000 mg | ORAL_TABLET | Freq: Three times a day (TID) | ORAL | 3 refills | Status: DC | PRN

## 2019-01-16 MED ORDER — AMLODIPINE BESYLATE 10 MG PO TABS: 10.0000 mg | ORAL_TABLET | Freq: Every day | ORAL | 1 refills | Status: AC

## 2019-01-16 NOTE — Assessment & Plan Note (Signed)
No further trouble but given a refill on the Ondansetron in case the vomiting returns.
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
## 2019-01-16 NOTE — Assessment & Plan Note (Signed)
Encouraged heart healthy diet, increase exercise, avoid trans fats, consider a krill oil cap daily 

## 2019-01-16 NOTE — Assessment & Plan Note (Signed)
Well controlled, no changes to meds. Encouraged heart healthy diet such as the DASH diet and exercise as tolerated.  °

## 2019-01-16 NOTE — Assessment & Plan Note (Signed)
Following closely with nephrology and stable. Has another appointment next month. Encouraged to increase hydration

## 2019-01-16 NOTE — Assessment & Plan Note (Signed)
Supplement and monitor 

## 2019-01-16 NOTE — Assessment & Plan Note (Signed)
hgba1c acceptable, minimize simple carbs. Increase exercise as tolerated. Continue current meds 

## 2019-01-24 DIAGNOSIS — N184 Chronic kidney disease, stage 4 (severe): Secondary | ICD-10-CM | POA: Diagnosis not present

## 2019-02-05 DIAGNOSIS — I739 Peripheral vascular disease, unspecified: Secondary | ICD-10-CM | POA: Diagnosis not present

## 2019-02-05 DIAGNOSIS — N184 Chronic kidney disease, stage 4 (severe): Secondary | ICD-10-CM | POA: Diagnosis not present

## 2019-02-05 DIAGNOSIS — E1140 Type 2 diabetes mellitus with diabetic neuropathy, unspecified: Secondary | ICD-10-CM | POA: Diagnosis not present

## 2019-02-05 DIAGNOSIS — N2581 Secondary hyperparathyroidism of renal origin: Secondary | ICD-10-CM | POA: Diagnosis not present

## 2019-02-05 DIAGNOSIS — E1122 Type 2 diabetes mellitus with diabetic chronic kidney disease: Secondary | ICD-10-CM | POA: Diagnosis not present

## 2019-02-05 DIAGNOSIS — N189 Chronic kidney disease, unspecified: Secondary | ICD-10-CM | POA: Diagnosis not present

## 2019-02-05 DIAGNOSIS — D631 Anemia in chronic kidney disease: Secondary | ICD-10-CM | POA: Diagnosis not present

## 2019-02-05 DIAGNOSIS — I5031 Acute diastolic (congestive) heart failure: Secondary | ICD-10-CM | POA: Diagnosis not present

## 2019-02-21 ENCOUNTER — Emergency Department (HOSPITAL_BASED_OUTPATIENT_CLINIC_OR_DEPARTMENT_OTHER): Payer: Medicare HMO

## 2019-02-21 ENCOUNTER — Telehealth: Payer: Self-pay

## 2019-02-21 ENCOUNTER — Encounter (HOSPITAL_BASED_OUTPATIENT_CLINIC_OR_DEPARTMENT_OTHER): Payer: Self-pay | Admitting: Adult Health

## 2019-02-21 ENCOUNTER — Ambulatory Visit: Payer: Medicare HMO | Admitting: Endocrinology

## 2019-02-21 ENCOUNTER — Other Ambulatory Visit: Payer: Self-pay

## 2019-02-21 ENCOUNTER — Observation Stay (HOSPITAL_BASED_OUTPATIENT_CLINIC_OR_DEPARTMENT_OTHER)
Admission: EM | Admit: 2019-02-21 | Discharge: 2019-02-23 | Disposition: A | Discharge status: Home / Self Care | Payer: Medicare HMO | Attending: Internal Medicine | Admitting: Internal Medicine

## 2019-02-21 DIAGNOSIS — D649 Anemia, unspecified: Secondary | ICD-10-CM | POA: Diagnosis present

## 2019-02-21 DIAGNOSIS — Z885 Allergy status to narcotic agent status: Secondary | ICD-10-CM | POA: Diagnosis not present

## 2019-02-21 DIAGNOSIS — Z8249 Family history of ischemic heart disease and other diseases of the circulatory system: Secondary | ICD-10-CM | POA: Insufficient documentation

## 2019-02-21 DIAGNOSIS — I10 Essential (primary) hypertension: Secondary | ICD-10-CM | POA: Diagnosis present

## 2019-02-21 DIAGNOSIS — Z79890 Hormone replacement therapy (postmenopausal): Secondary | ICD-10-CM | POA: Insufficient documentation

## 2019-02-21 DIAGNOSIS — M25552 Pain in left hip: Secondary | ICD-10-CM | POA: Diagnosis not present

## 2019-02-21 DIAGNOSIS — E1140 Type 2 diabetes mellitus with diabetic neuropathy, unspecified: Secondary | ICD-10-CM | POA: Insufficient documentation

## 2019-02-21 DIAGNOSIS — Z833 Family history of diabetes mellitus: Secondary | ICD-10-CM | POA: Insufficient documentation

## 2019-02-21 DIAGNOSIS — Z87891 Personal history of nicotine dependence: Secondary | ICD-10-CM | POA: Insufficient documentation

## 2019-02-21 DIAGNOSIS — Z888 Allergy status to other drugs, medicaments and biological substances status: Secondary | ICD-10-CM | POA: Diagnosis not present

## 2019-02-21 DIAGNOSIS — R531 Weakness: Secondary | ICD-10-CM

## 2019-02-21 DIAGNOSIS — D631 Anemia in chronic kidney disease: Secondary | ICD-10-CM | POA: Insufficient documentation

## 2019-02-21 DIAGNOSIS — N184 Chronic kidney disease, stage 4 (severe): Secondary | ICD-10-CM | POA: Insufficient documentation

## 2019-02-21 DIAGNOSIS — E039 Hypothyroidism, unspecified: Secondary | ICD-10-CM | POA: Insufficient documentation

## 2019-02-21 DIAGNOSIS — I5032 Chronic diastolic (congestive) heart failure: Secondary | ICD-10-CM | POA: Diagnosis not present

## 2019-02-21 DIAGNOSIS — Z79899 Other long term (current) drug therapy: Secondary | ICD-10-CM | POA: Insufficient documentation

## 2019-02-21 DIAGNOSIS — K5900 Constipation, unspecified: Secondary | ICD-10-CM | POA: Insufficient documentation

## 2019-02-21 DIAGNOSIS — K8020 Calculus of gallbladder without cholecystitis without obstruction: Secondary | ICD-10-CM | POA: Diagnosis not present

## 2019-02-21 DIAGNOSIS — E1122 Type 2 diabetes mellitus with diabetic chronic kidney disease: Secondary | ICD-10-CM | POA: Diagnosis not present

## 2019-02-21 DIAGNOSIS — N451 Epididymitis: Secondary | ICD-10-CM | POA: Insufficient documentation

## 2019-02-21 DIAGNOSIS — E782 Mixed hyperlipidemia: Secondary | ICD-10-CM | POA: Diagnosis not present

## 2019-02-21 DIAGNOSIS — I130 Hypertensive heart and chronic kidney disease with heart failure and stage 1 through stage 4 chronic kidney disease, or unspecified chronic kidney disease: Secondary | ICD-10-CM | POA: Insufficient documentation

## 2019-02-21 DIAGNOSIS — Z794 Long term (current) use of insulin: Secondary | ICD-10-CM | POA: Diagnosis not present

## 2019-02-21 DIAGNOSIS — Z20828 Contact with and (suspected) exposure to other viral communicable diseases: Secondary | ICD-10-CM | POA: Insufficient documentation

## 2019-02-21 DIAGNOSIS — Z7982 Long term (current) use of aspirin: Secondary | ICD-10-CM | POA: Insufficient documentation

## 2019-02-21 DIAGNOSIS — I959 Hypotension, unspecified: Secondary | ICD-10-CM | POA: Diagnosis not present

## 2019-02-21 DIAGNOSIS — E1121 Type 2 diabetes mellitus with diabetic nephropathy: Secondary | ICD-10-CM | POA: Insufficient documentation

## 2019-02-21 DIAGNOSIS — E1151 Type 2 diabetes mellitus with diabetic peripheral angiopathy without gangrene: Secondary | ICD-10-CM | POA: Insufficient documentation

## 2019-02-21 DIAGNOSIS — E860 Dehydration: Secondary | ICD-10-CM | POA: Insufficient documentation

## 2019-02-21 DIAGNOSIS — N390 Urinary tract infection, site not specified: Secondary | ICD-10-CM | POA: Diagnosis present

## 2019-02-21 DIAGNOSIS — S79912A Unspecified injury of left hip, initial encounter: Secondary | ICD-10-CM | POA: Diagnosis not present

## 2019-02-21 DIAGNOSIS — R5381 Other malaise: Secondary | ICD-10-CM | POA: Diagnosis present

## 2019-02-21 DIAGNOSIS — R972 Elevated prostate specific antigen [PSA]: Secondary | ICD-10-CM | POA: Insufficient documentation

## 2019-02-21 DIAGNOSIS — I2510 Atherosclerotic heart disease of native coronary artery without angina pectoris: Secondary | ICD-10-CM | POA: Diagnosis not present

## 2019-02-21 LAB — COMPREHENSIVE METABOLIC PANEL
ALT: 12 U/L (ref 0–44)
AST: 19 U/L (ref 15–41)
Albumin: 2.7 g/dL — ABNORMAL LOW (ref 3.5–5.0)
Alkaline Phosphatase: 75 U/L (ref 38–126)
Anion gap: 10 (ref 5–15)
BUN: 62 mg/dL — ABNORMAL HIGH (ref 8–23)
CO2: 21 mmol/L — ABNORMAL LOW (ref 22–32)
Calcium: 8.7 mg/dL — ABNORMAL LOW (ref 8.9–10.3)
Chloride: 103 mmol/L (ref 98–111)
Creatinine, Ser: 3.34 mg/dL — ABNORMAL HIGH (ref 0.61–1.24)
GFR calc Af Amer: 20 mL/min — ABNORMAL LOW (ref >60)
GFR calc non Af Amer: 17 mL/min — ABNORMAL LOW (ref >60)
Glucose, Bld: 222 mg/dL — ABNORMAL HIGH (ref 70–99)
Potassium: 3.9 mmol/L (ref 3.5–5.1)
Sodium: 134 mmol/L — ABNORMAL LOW (ref 135–145)
Total Bilirubin: 0.6 mg/dL (ref 0.3–1.2)
Total Protein: 7.5 g/dL (ref 6.5–8.1)

## 2019-02-21 LAB — CBC WITH DIFFERENTIAL/PLATELET
Abs Immature Granulocytes: 0.17 10*3/uL — ABNORMAL HIGH (ref 0.00–0.07)
Basophils Absolute: 0 10*3/uL (ref 0.0–0.1)
Basophils Relative: 0 %
Eosinophils Absolute: 0.1 10*3/uL (ref 0.0–0.5)
Eosinophils Relative: 1 %
HCT: 31.6 % — ABNORMAL LOW (ref 39.0–52.0)
Hemoglobin: 10.3 g/dL — ABNORMAL LOW (ref 13.0–17.0)
Immature Granulocytes: 1 %
Lymphocytes Relative: 7 %
Lymphs Abs: 1.1 10*3/uL (ref 0.7–4.0)
MCH: 27.1 pg (ref 26.0–34.0)
MCHC: 32.6 g/dL (ref 30.0–36.0)
MCV: 83.2 fL (ref 80.0–100.0)
Monocytes Absolute: 1.2 10*3/uL — ABNORMAL HIGH (ref 0.1–1.0)
Monocytes Relative: 7 %
Neutro Abs: 13.1 10*3/uL — ABNORMAL HIGH (ref 1.7–7.7)
Neutrophils Relative %: 84 %
Platelets: 294 10*3/uL (ref 150–400)
RBC: 3.8 MIL/uL — ABNORMAL LOW (ref 4.22–5.81)
RDW: 13.2 % (ref 11.5–15.5)
WBC: 15.6 10*3/uL — ABNORMAL HIGH (ref 4.0–10.5)
nRBC: 0 % (ref 0.0–0.2)

## 2019-02-21 LAB — TROPONIN I (HIGH SENSITIVITY): Troponin I (High Sensitivity): 14 ng/L (ref <18)

## 2019-02-21 LAB — URINALYSIS, ROUTINE W REFLEX MICROSCOPIC
Bilirubin Urine: NEGATIVE
Glucose, UA: 100 mg/dL — AB
Ketones, ur: NEGATIVE mg/dL
Nitrite: NEGATIVE
Protein, ur: 30 mg/dL — AB
Specific Gravity, Urine: 1.02 (ref 1.005–1.030)
pH: 5.5 (ref 5.0–8.0)

## 2019-02-21 LAB — URINALYSIS, MICROSCOPIC (REFLEX)

## 2019-02-21 LAB — CBG MONITORING, ED
Glucose-Capillary: 210 mg/dL — ABNORMAL HIGH (ref 70–99)
Glucose-Capillary: 201 mg/dL — ABNORMAL HIGH (ref 70–99)

## 2019-02-21 LAB — LACTIC ACID, PLASMA: Lactic Acid, Venous: 0.9 mmol/L (ref 0.5–1.9)

## 2019-02-21 IMAGING — CR DG HIP (WITH OR WITHOUT PELVIS) 2-3V*L*
3 series · 3 of 3 positions shown · non-contrast
Comparison: None.

CLINICAL DATA: Left hip pain

EXAM:
DG HIP (WITH OR WITHOUT PELVIS) 2-3V LEFT

[t pelvis a.p.]
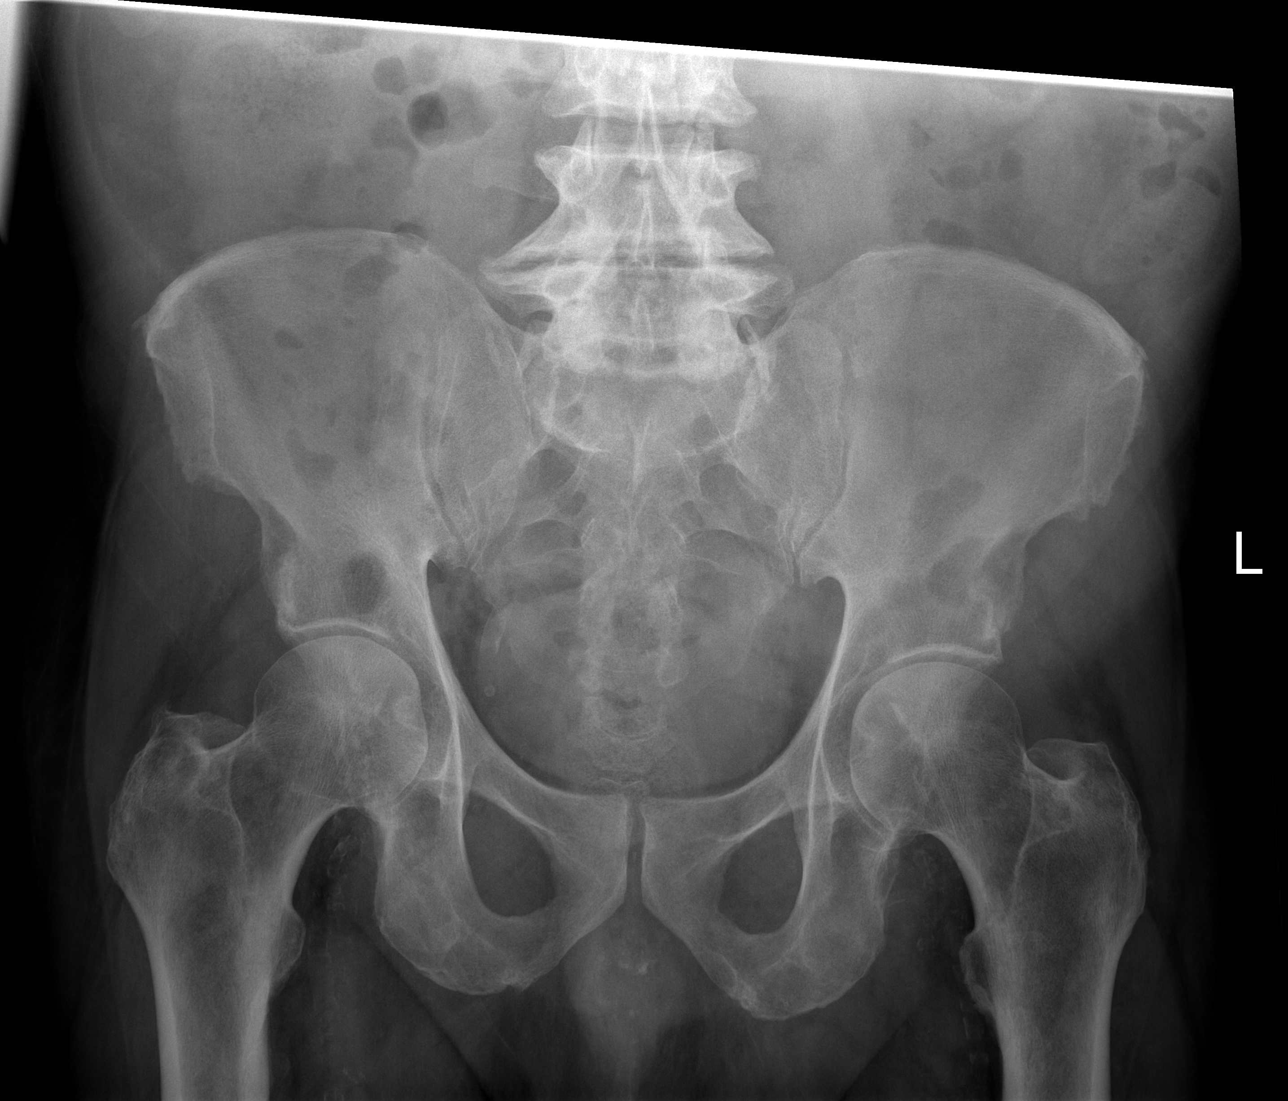

[t hip ap left]
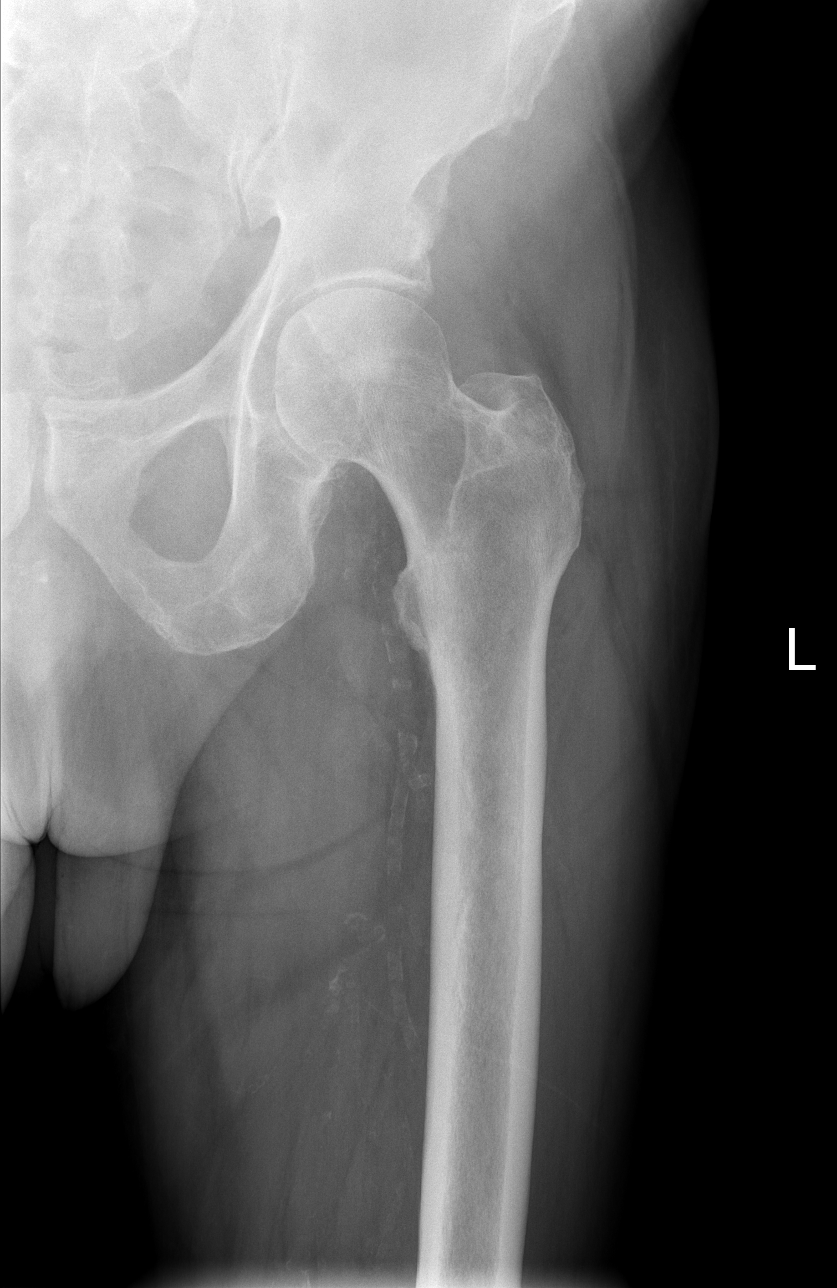

[t hip frog leg left]
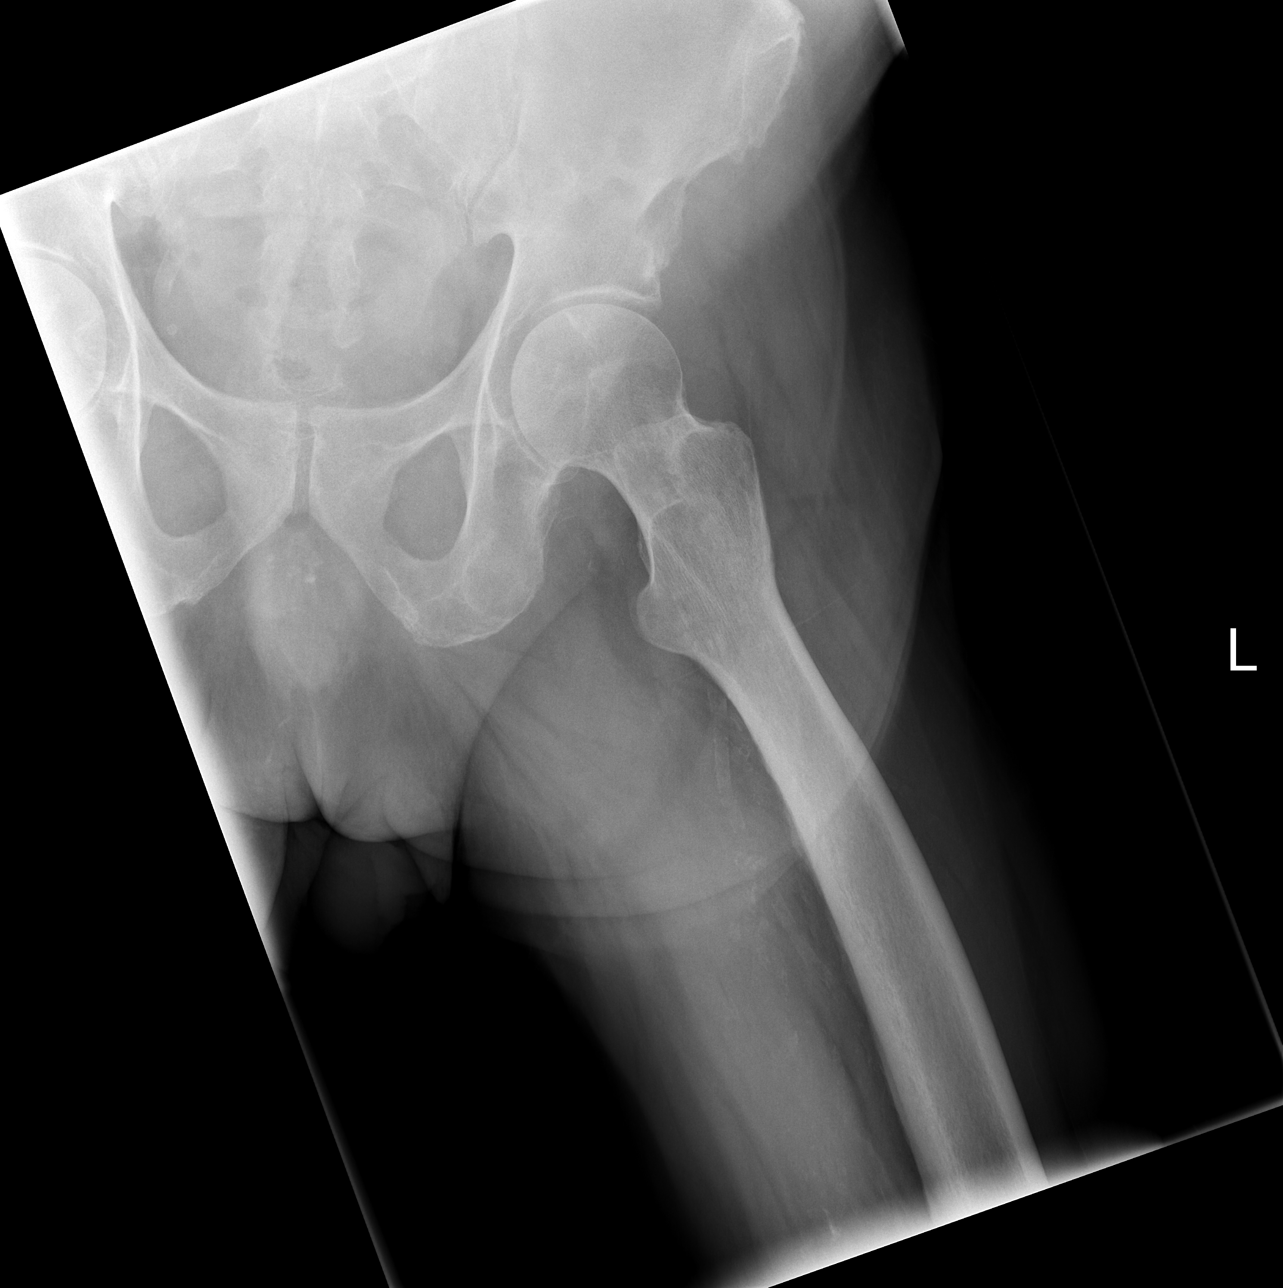

[3 of 3 positions shown; findings below may reference images not displayed]

FINDINGS: No fracture or dislocation is seen.

Bilateral joint spaces are preserved.

Visualized bony pelvis appears intact.

Degenerative changes of the lower lumbar spine.
IMPRESSION: Negative.

## 2019-02-21 IMAGING — CR DG CHEST 2V
3 series · 3 of 3 positions shown · non-contrast
Comparison: [DATE]

CLINICAL DATA: Left hip pain

EXAM:
CHEST - 2 VIEW

[w chest pa (1 of 2)]
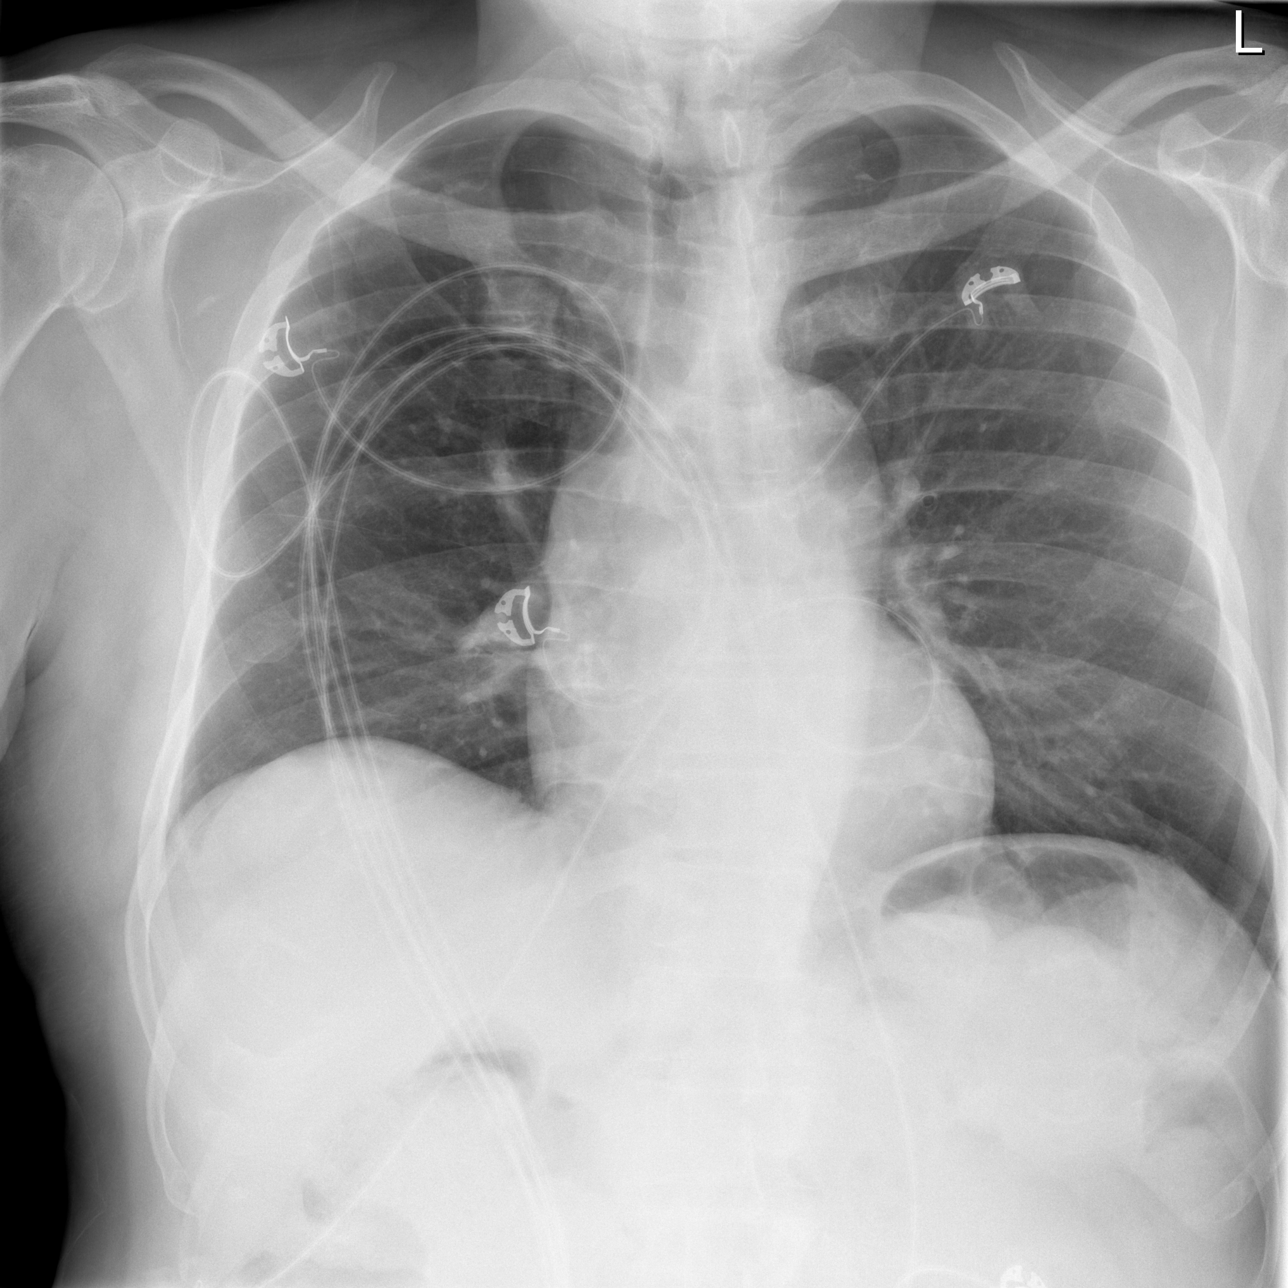

[w chest pa (2 of 2)]
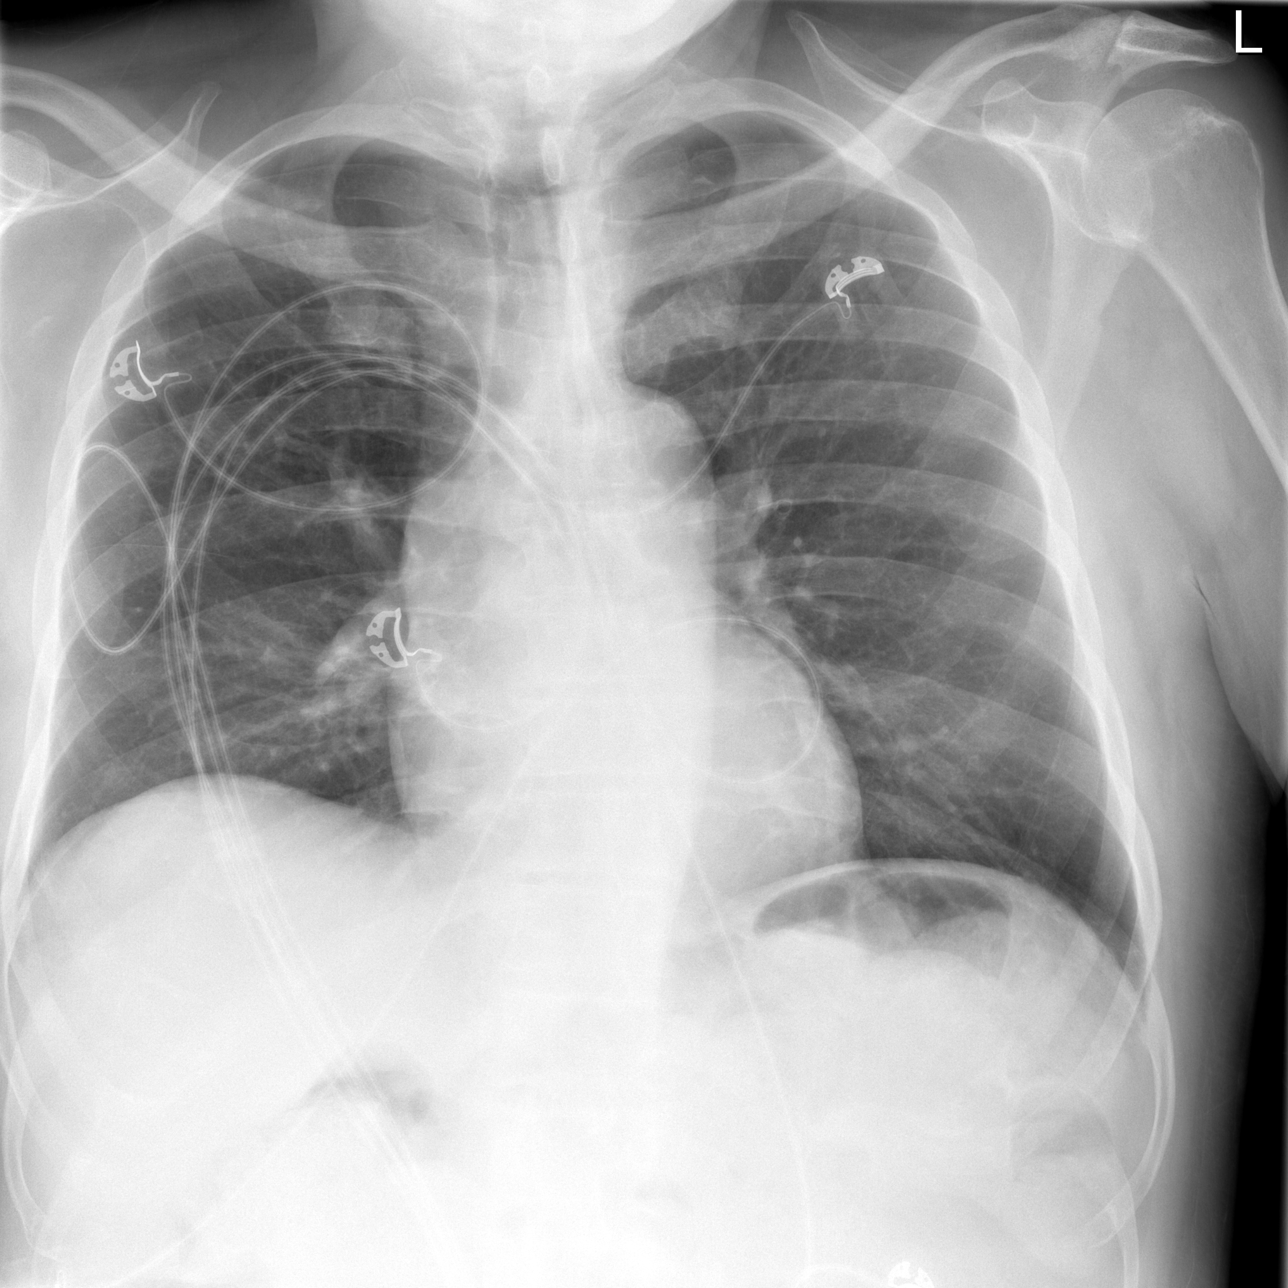

[w chest lat]
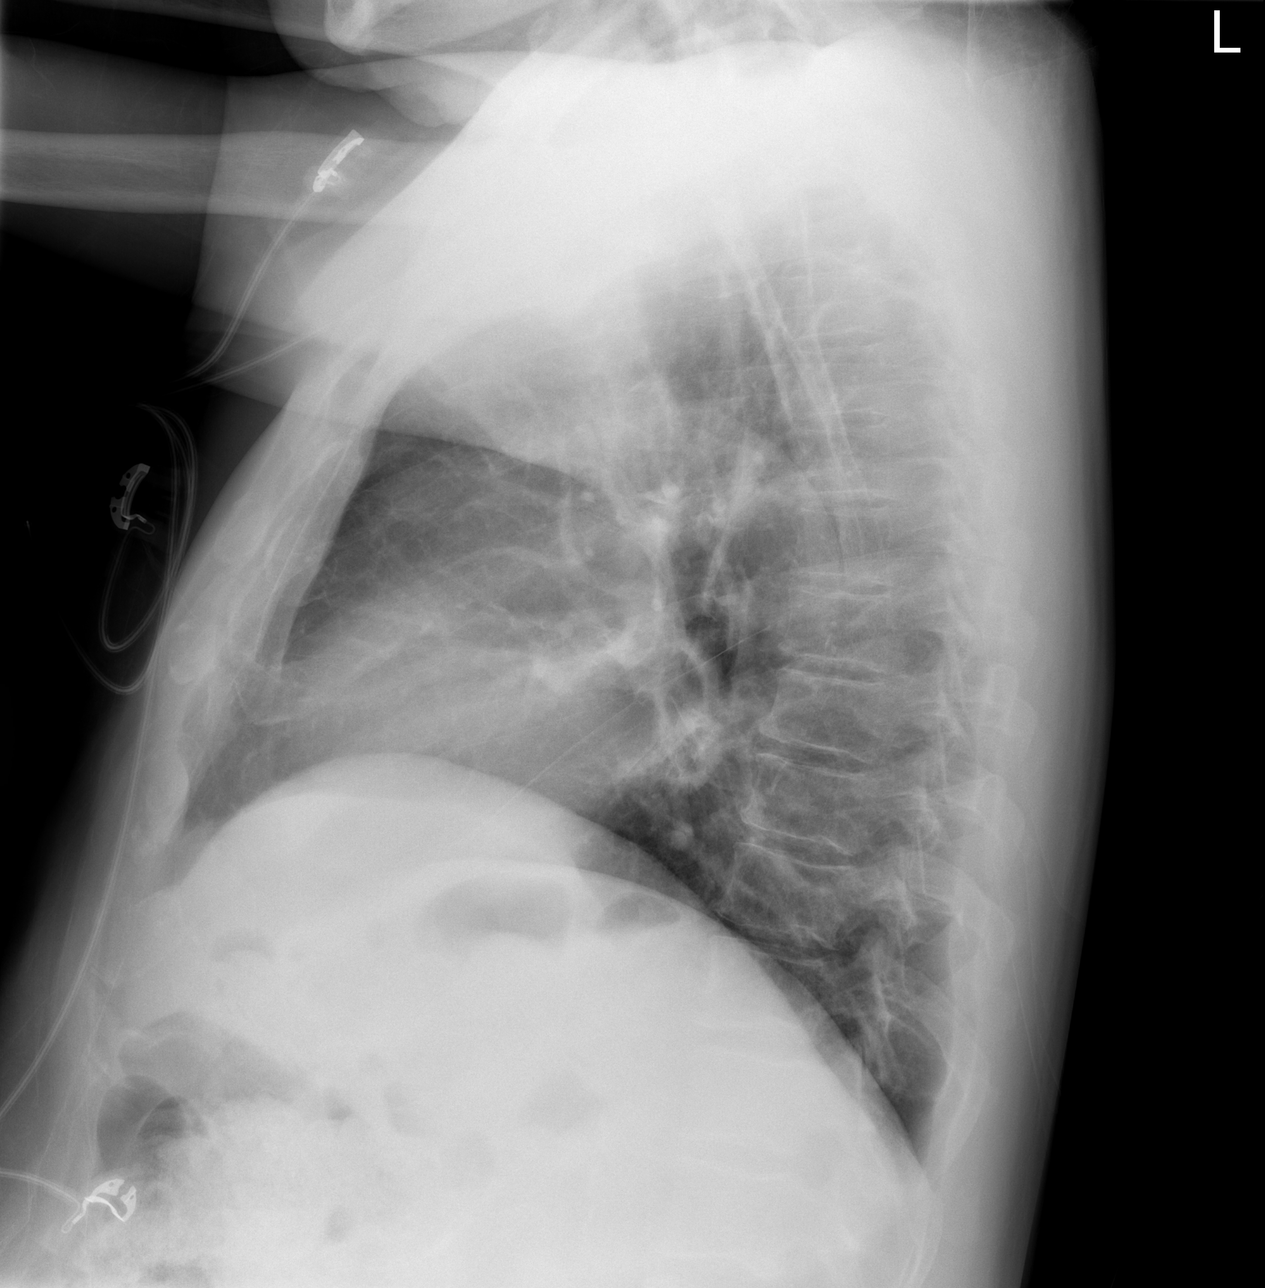

[3 of 3 positions shown; findings below may reference images not displayed]

FINDINGS: Lungs are clear.  No pleural effusion or pneumothorax.

The heart is normal in size.

Visualized osseous structures are within normal limits.
IMPRESSION: Normal chest radiographs.

## 2019-02-21 IMAGING — US US SCROTUM W/ DOPPLER COMPLETE
1 series · 14 of 25 positions shown · non-contrast
Comparison: None.

CLINICAL DATA: Left testicular pain and swelling for 2-3 days

EXAM:
SCROTAL ULTRASOUND
DOPPLER ULTRASOUND OF THE TESTICLES
TECHNIQUE: Complete ultrasound examination of the testicles, epididymis, and
other scrotal structures was performed. Color and spectral Doppler
ultrasound were also utilized to evaluate blood flow to the
testicles.

[Series 1: us scrotum w/ doppler complete · 14 of 49 slices shown]
[im 1/49]
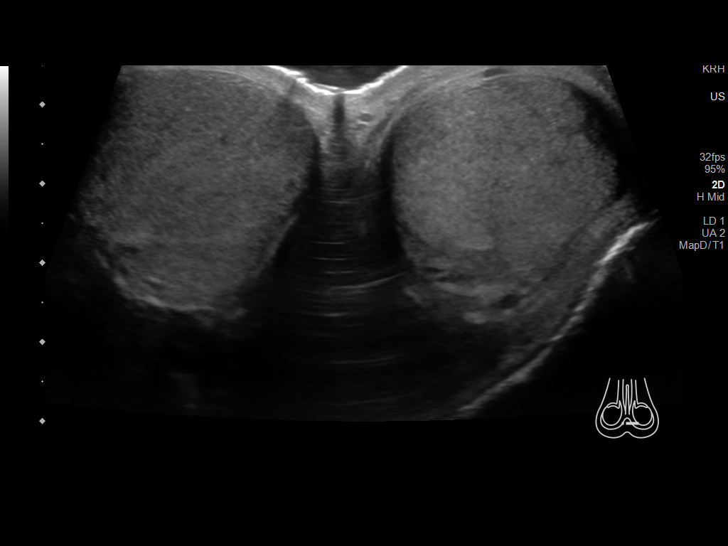
[im 5/49]
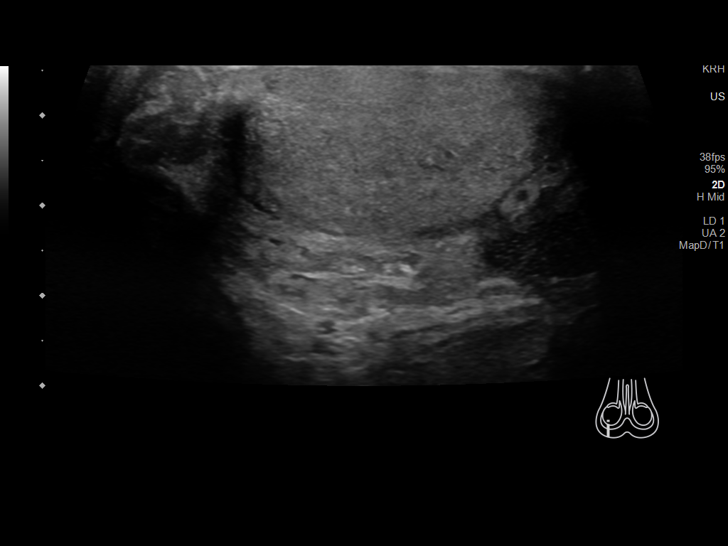
[im 9/49]
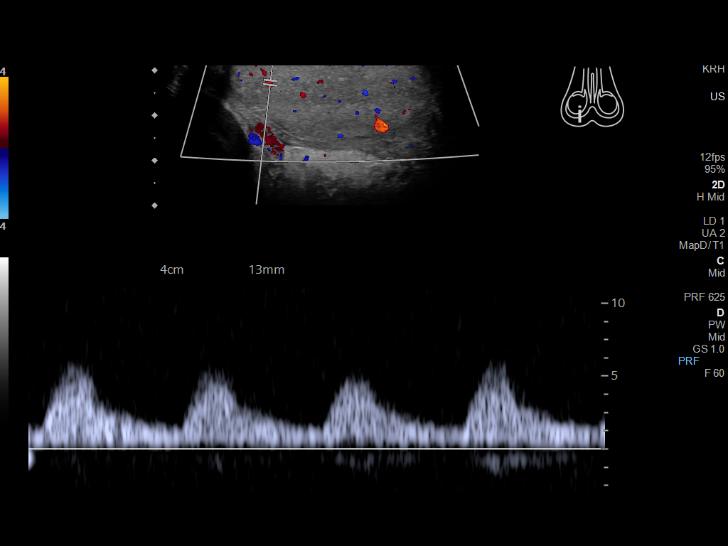
[im 13/49]
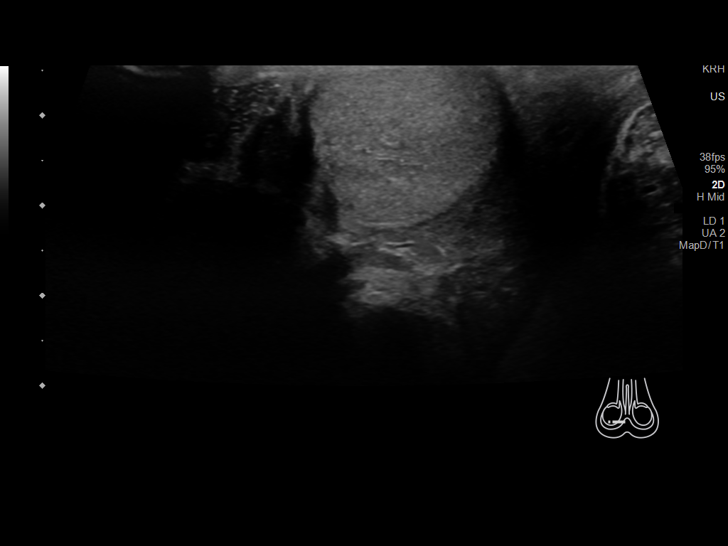
[im 17/49]
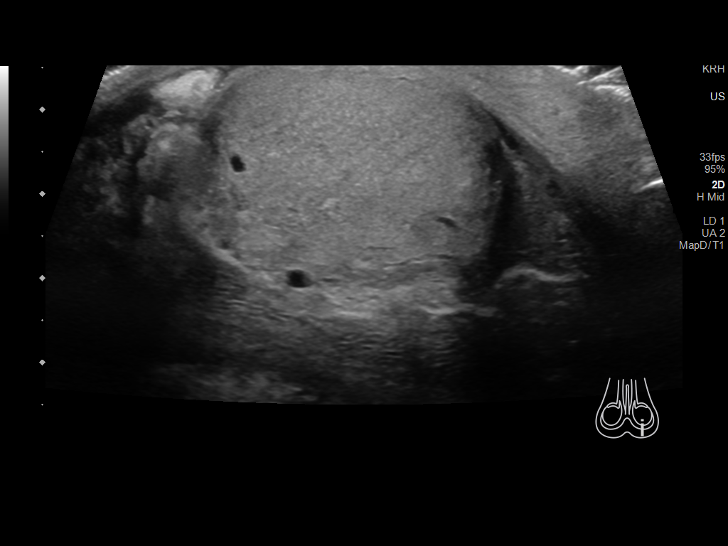
[im 19/49]
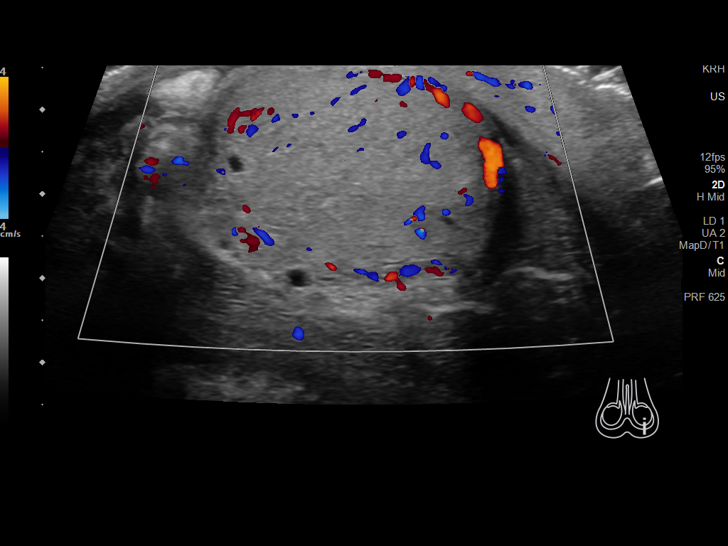
[im 23/49]
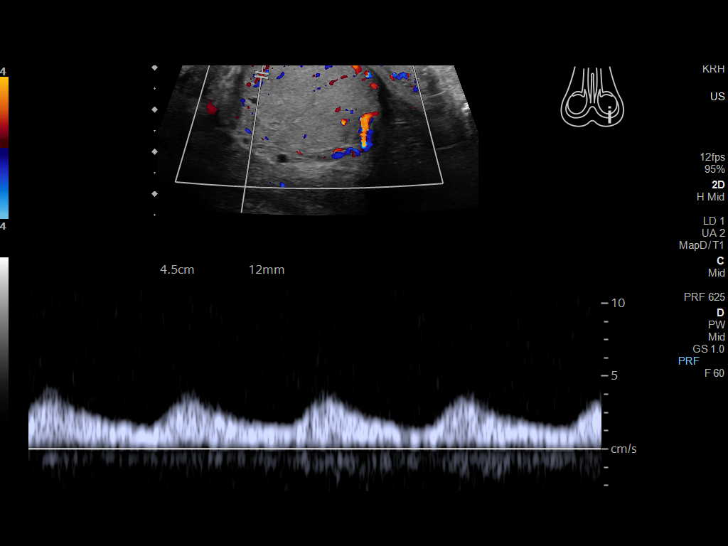
[im 27/49]
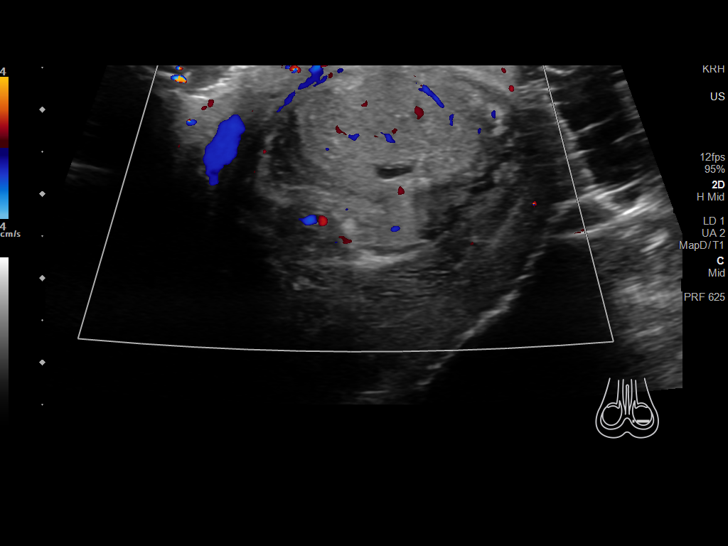
[im 31/49]
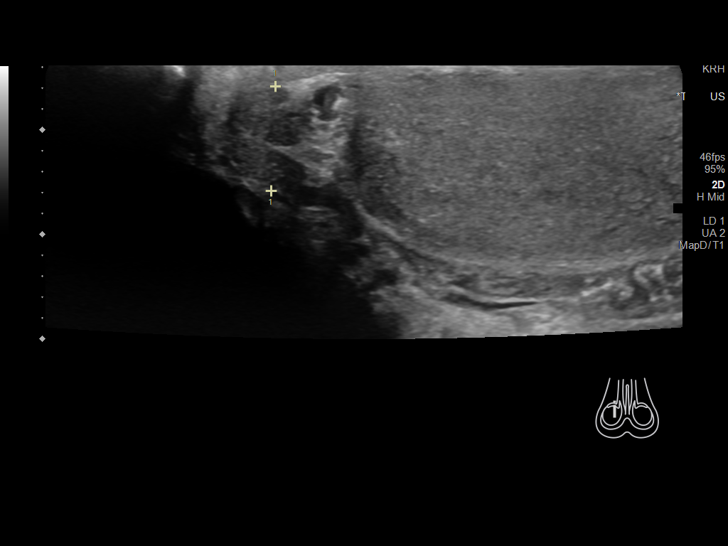
[im 33/49]
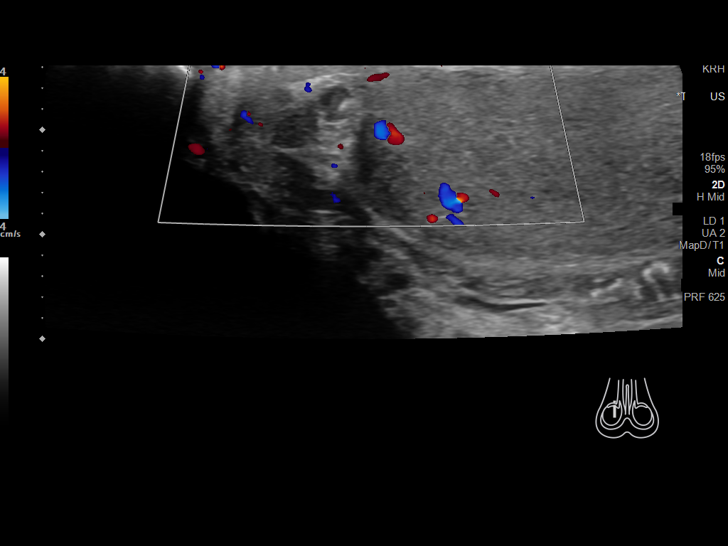
[im 37/49]
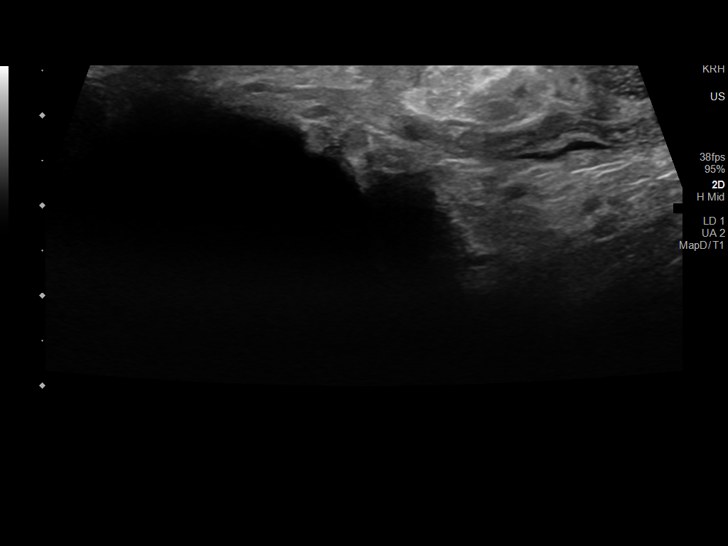
[im 41/49]
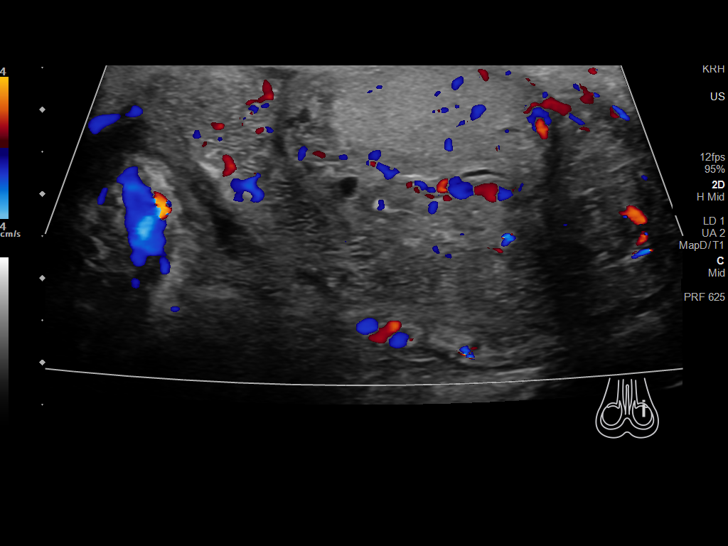
[im 45/49]
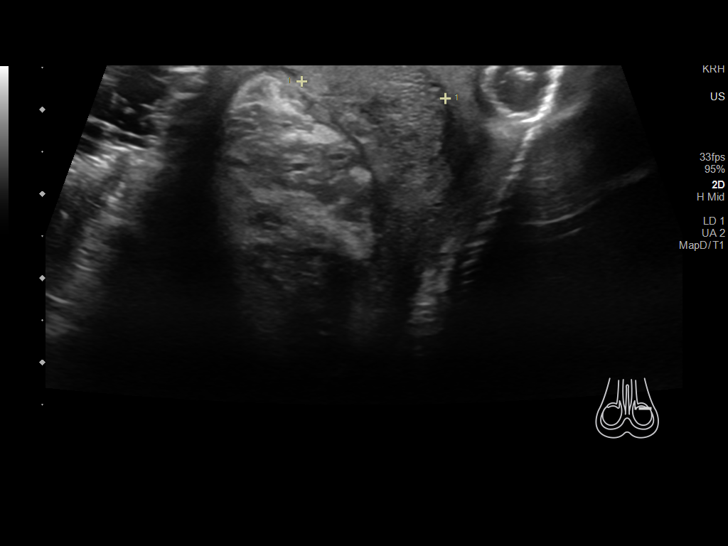
[im 49/49]
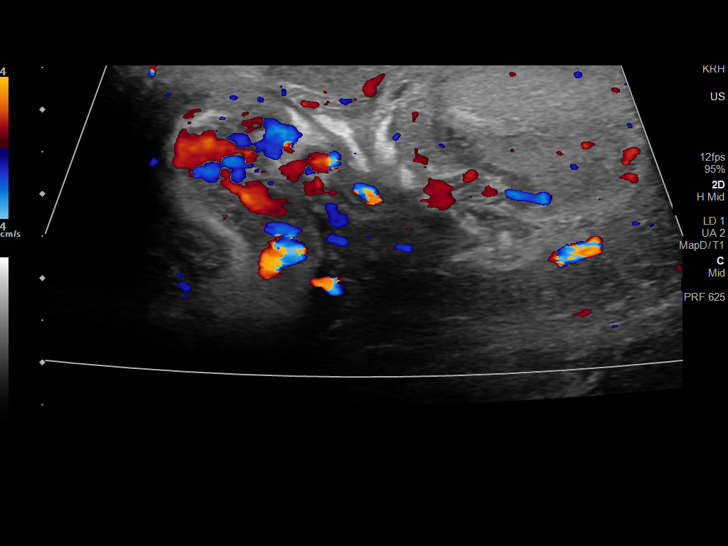

[14 of 25 positions shown; findings below may reference images not displayed]

FINDINGS: Right testicle

Measurements: 4.7 x 2.1 x 2.9 cm. No mass or microlithiasis
visualized.

Left testicle

Measurements: 3.6 x 2.2 x 2.8 cm. No mass or microlithiasis
visualized. 5 mm cyst in the superior aspect of the testicle.

Right epididymis:  Normal in size and appearance.

Left epididymis: Asymmetrically enlarged left epididymis with
increased Doppler flow

Hydrocele:  None visualized.

Varicocele:  None visualized.

Pulsed Doppler interrogation of both testes demonstrates normal low
resistance arterial and venous waveforms bilaterally.
IMPRESSION: 1. No testicular torsion.
2. Left epididymitis.

## 2019-02-21 IMAGING — CT CT ABD-PELV W/O CM
2 of 4 series · 16 of 46 positions shown, 18 images · non-contrast
Comparison: None.

CLINICAL DATA: Left lower quadrant pain

EXAM:
CT ABDOMEN AND PELVIS WITHOUT CONTRAST
TECHNIQUE: Multidetector CT imaging of the abdomen and pelvis was performed
following the standard protocol without IV contrast.

[Series 2: axial st · axial · 0.76mm/px · z∈[-450,+5]mm · 13 of 101 slices shown, 15 images]
[im 5/101  soft-tissue]
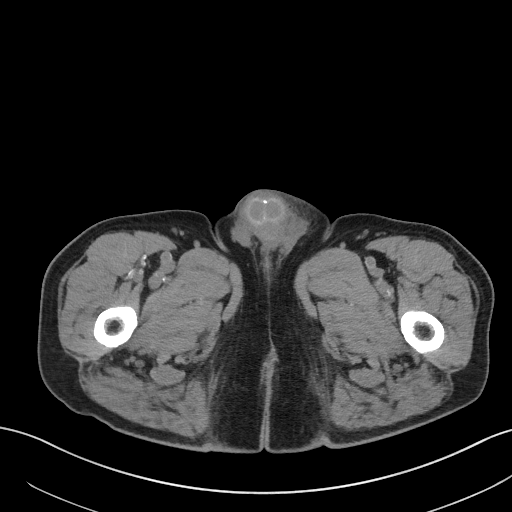
[im 5/101  bone]
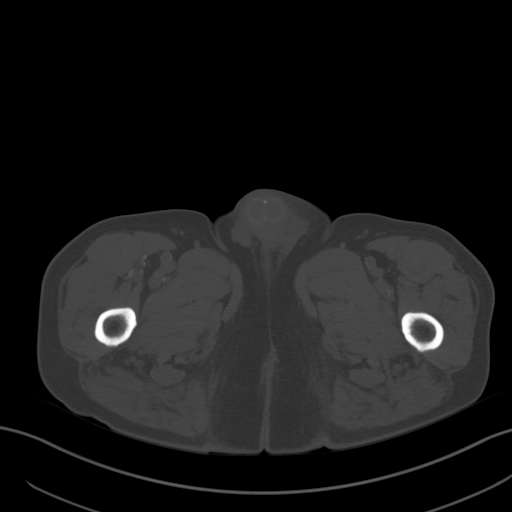
[im 14/101  soft-tissue]
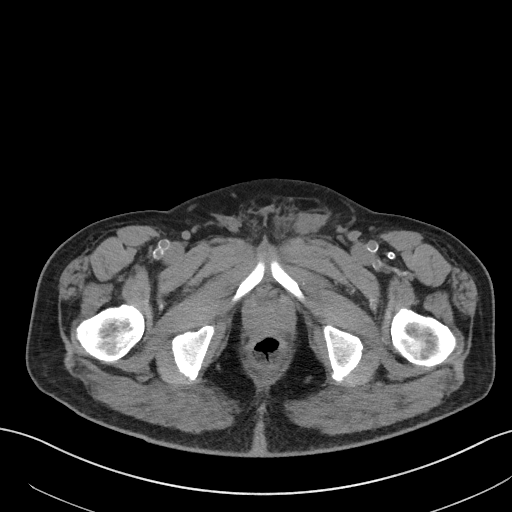
[im 22/101  soft-tissue]
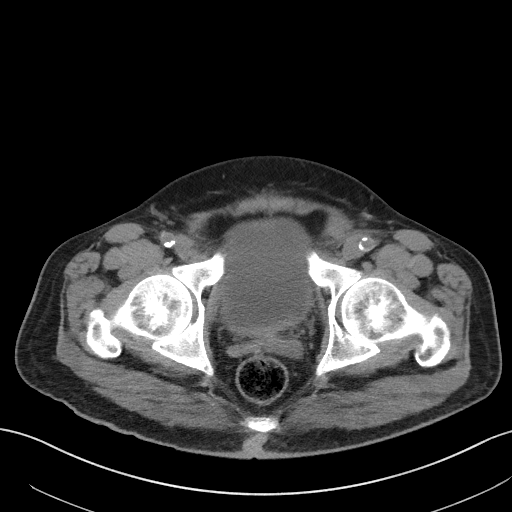
[im 27/101  soft-tissue]
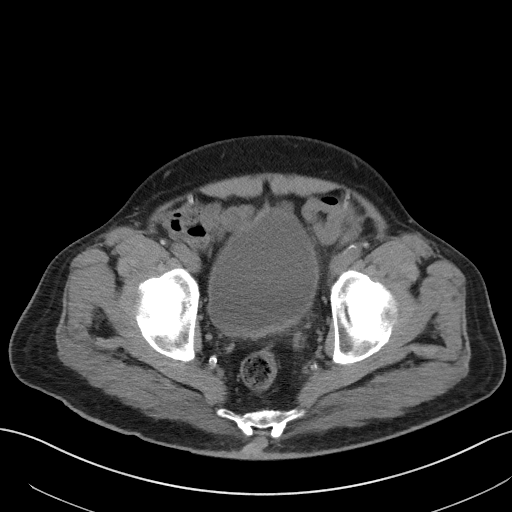
[im 35/101  soft-tissue]
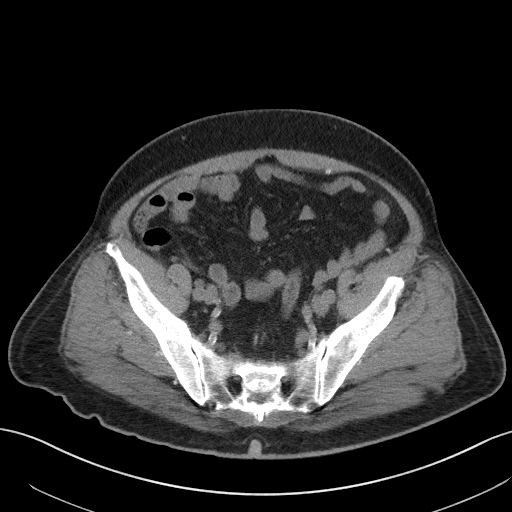
[im 44/101  soft-tissue]
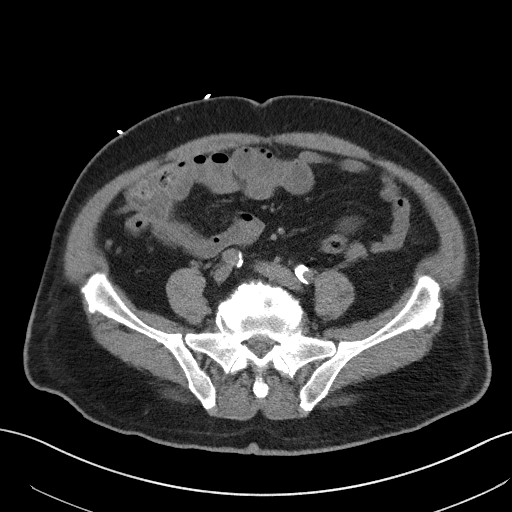
[im 53/101  soft-tissue]
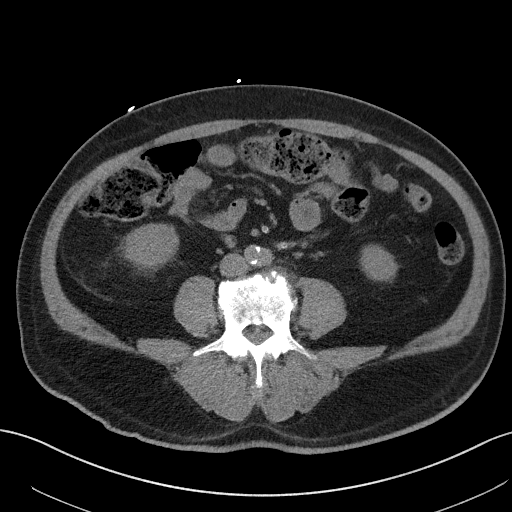
[im 57/101  soft-tissue]
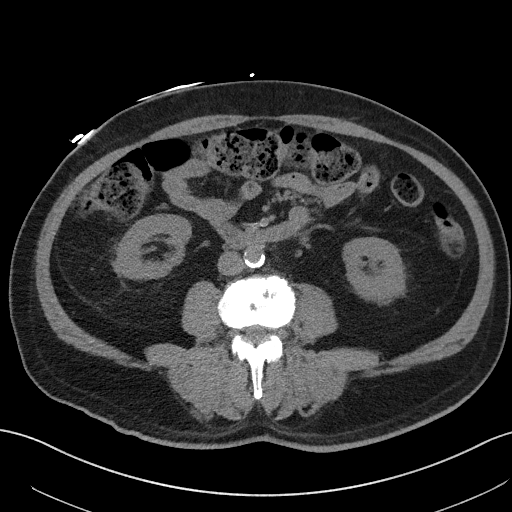
[im 66/101  soft-tissue]
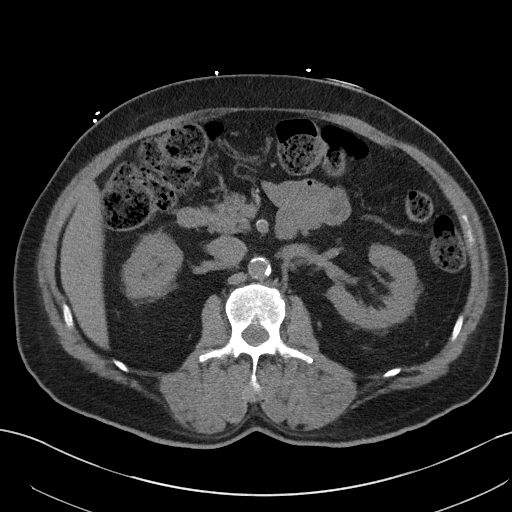
[im 66/101  bone]
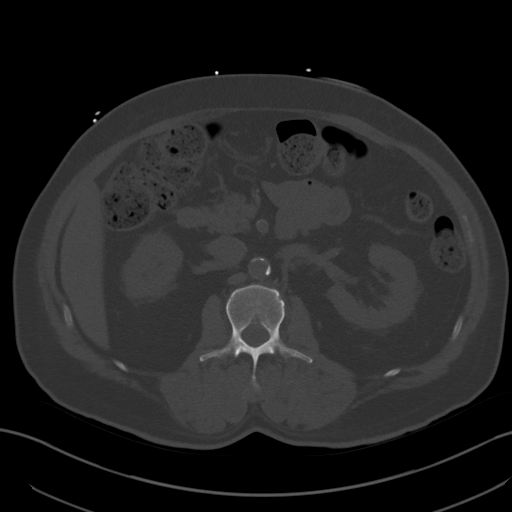
[im 74/101  soft-tissue]
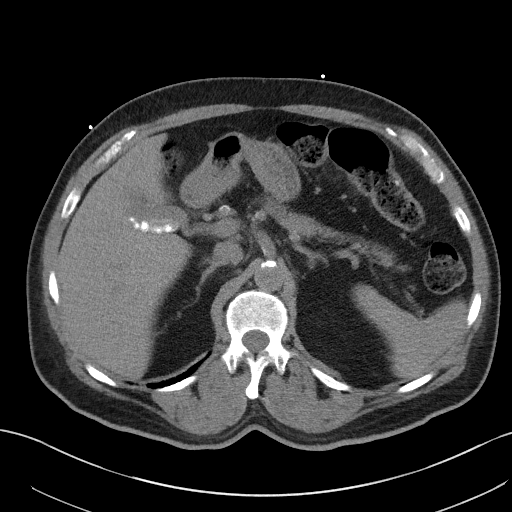
[im 79/101  soft-tissue]
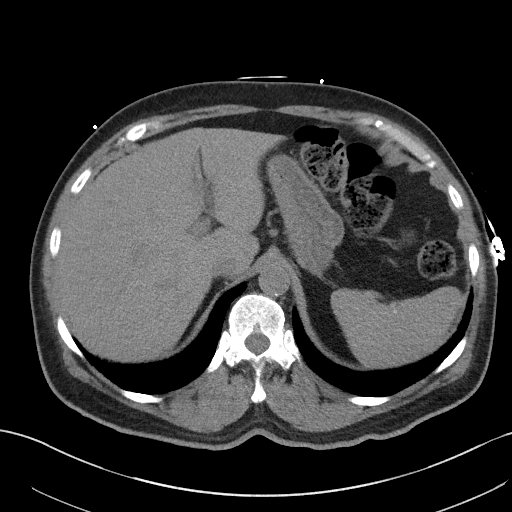
[im 87/101  soft-tissue]
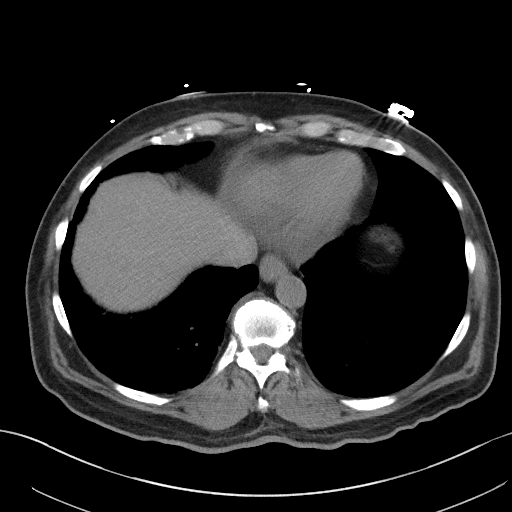
[im 96/101  soft-tissue]
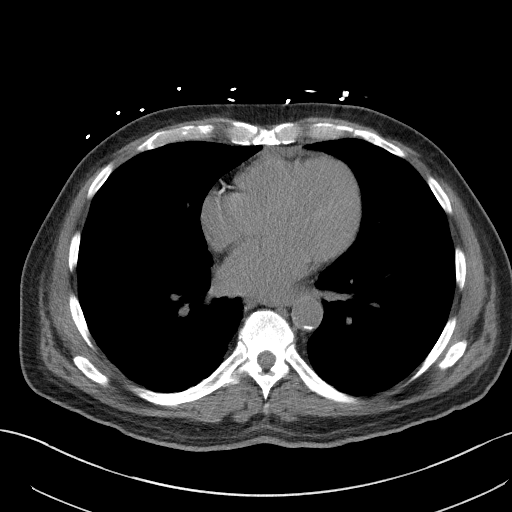

[Series 5: coronal st · coronal · 0.99mm/px · 3 of 101 slices shown]
[im 34/101  soft-tissue]
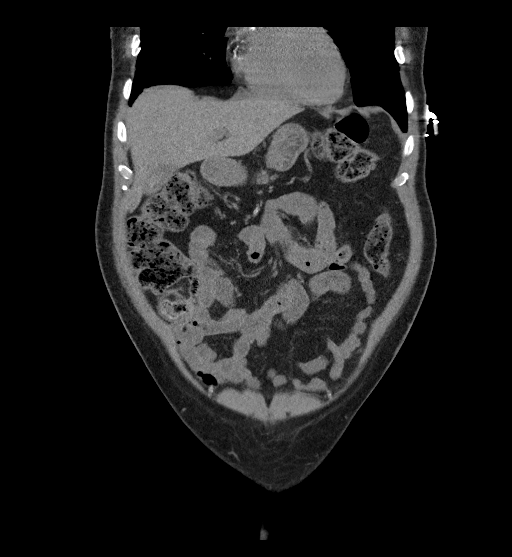
[im 45/101  soft-tissue]
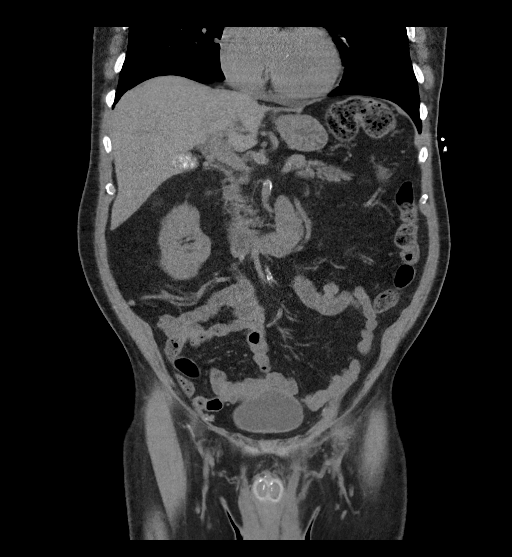
[im 56/101  soft-tissue]
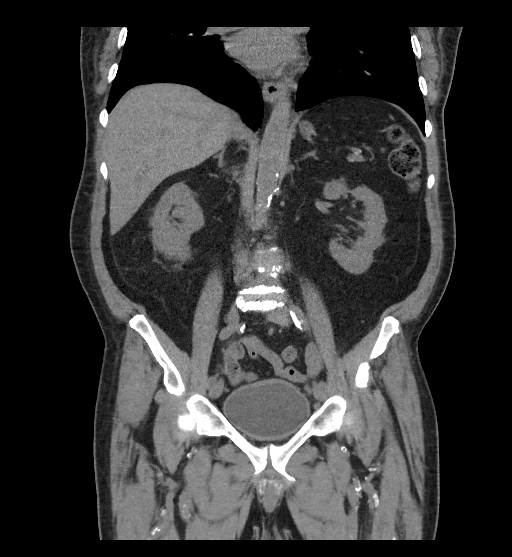

[16 of 46 positions shown; findings below may reference images not displayed]

FINDINGS: Lower chest: The visualized heart size within normal limits. No
pericardial fluid/thickening.

A small hiatal hernia is present.

The visualized portions of the lungs are clear.

Hepatobiliary: Although limited due to the lack of intravenous
contrast, normal in appearance without gross focal abnormality.
Multiple calcified gallstones are present.

Pancreas:  Unremarkable.  No surrounding inflammatory changes.

Spleen: Normal in size. Although limited due to the lack of
intravenous contrast, normal in appearance.

Adrenals/Urinary Tract: Both adrenal glands appear normal. The
kidneys and collecting system appear normal without evidence of
urinary tract calculus or hydronephrosis. Bladder is unremarkable.

Stomach/Bowel: The stomach and small bowel are normal in size and
contour. There is a moderate to large amount of colonic stool
without evidence of obstruction. Appendix is normal.

Vascular/Lymphatic: There are no enlarged abdominal or pelvic lymph
nodes. Scattered aortic atherosclerotic calcifications are seen
without aneurysmal dilatation.

Reproductive: The prostate is unremarkable.

Other: No evidence of abdominal wall mass or hernia.

Musculoskeletal: There are advanced degenerative changes seen from
L3-L5 with Schmorl's nodes endplate sclerosis and air within the
disc spaces.
IMPRESSION: No acute intra-abdominal or pelvic pathology.

Cholelithiasis

Moderate to large amount of colonic stool without evidence of
obstruction

Advanced degenerative changes seen from L3 through L5.

## 2019-02-21 IMAGING — CT CT HEAD W/O CM
3 series · 14 of 47 positions shown, 16 images · non-contrast
Comparison: None.

CLINICAL DATA: Weakness and left leg pain.

EXAM:
CT HEAD WITHOUT CONTRAST
TECHNIQUE: Contiguous axial images were obtained from the base of the skull
through the vertex without intravenous contrast.

[Series 2: head wo · axial · 0.49mm/px · z∈[-159,-29]mm · 8 of 32 slices shown, 10 images]
[im 3/32  brain]
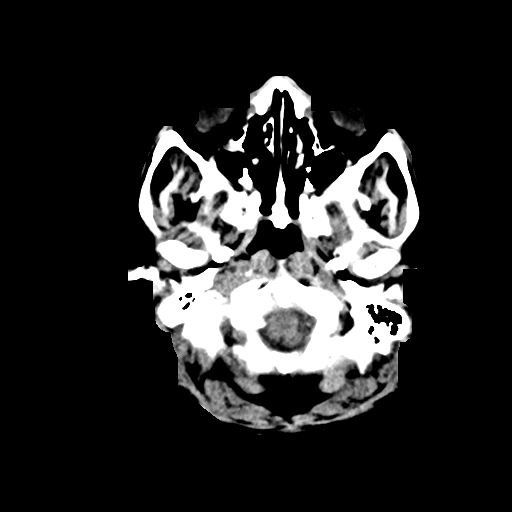
[im 3/32  bone]
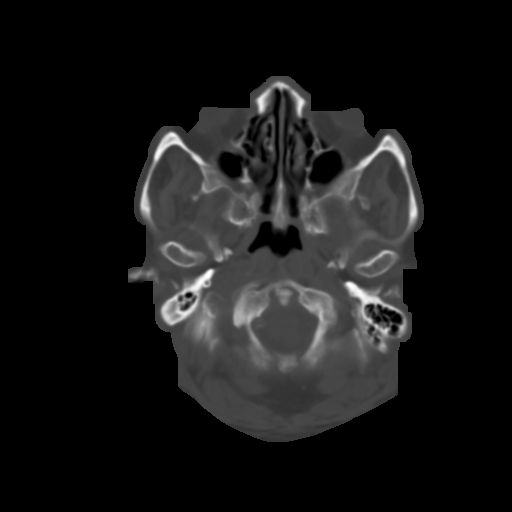
[im 7/32  brain]
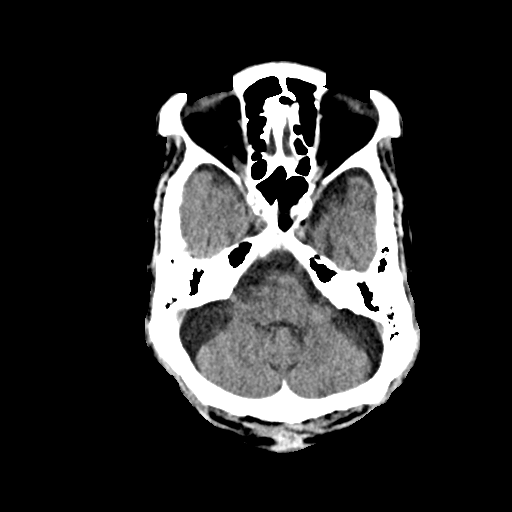
[im 10/32  brain]
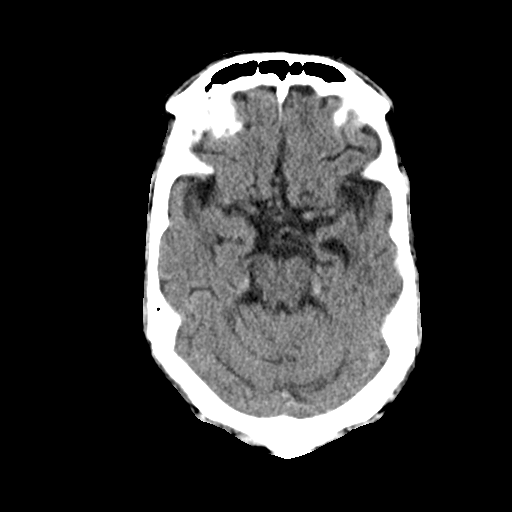
[im 14/32  brain]
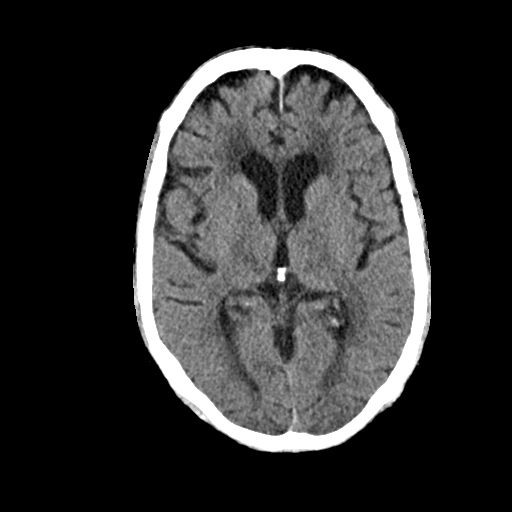
[im 18/32  brain]
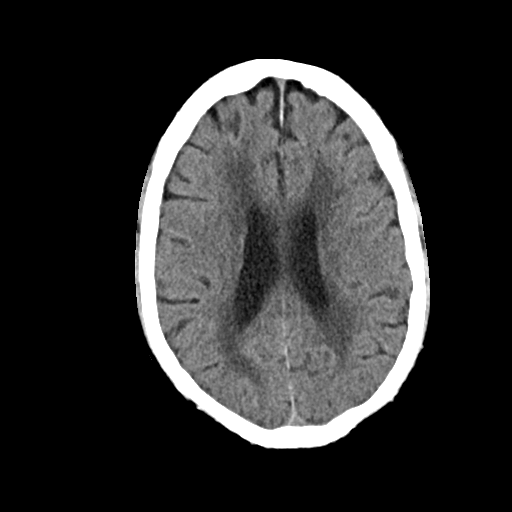
[im 18/32  bone]
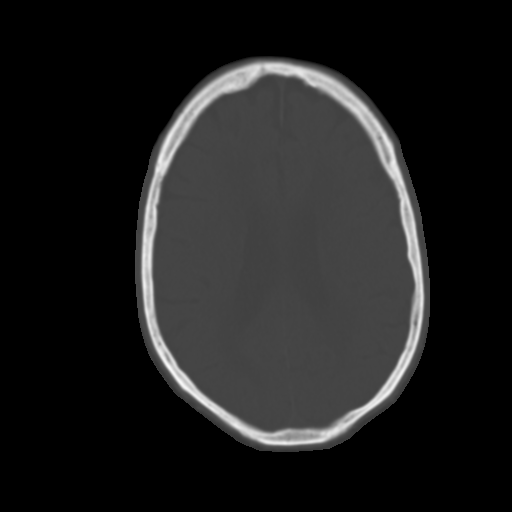
[im 22/32  brain]
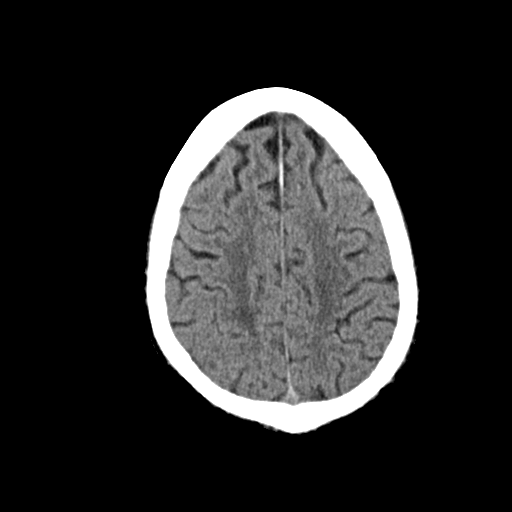
[im 25/32  brain]
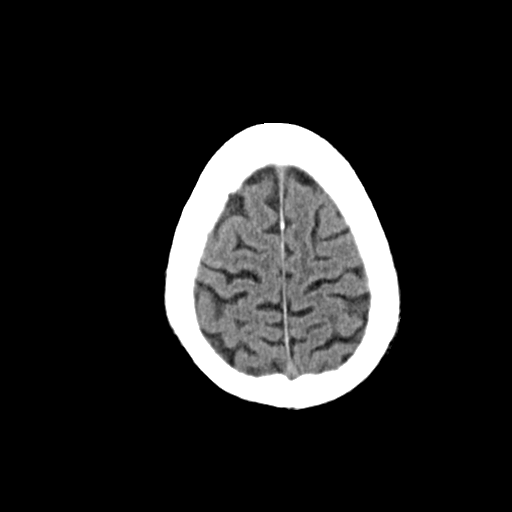
[im 29/32  brain]
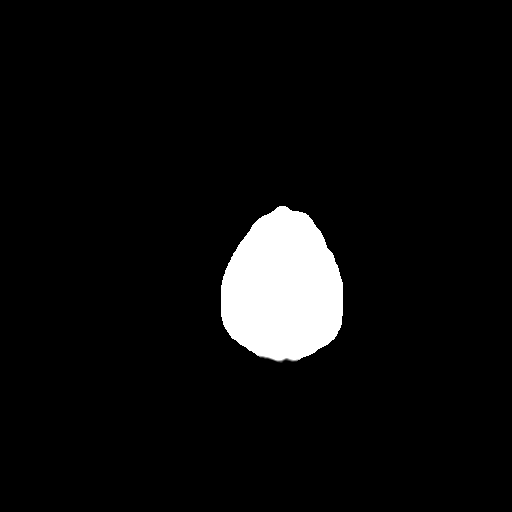

[Series 4: coronal soft · coronal · 0.30mm/px · 3 of 73 slices shown]
[im 25/73  brain]
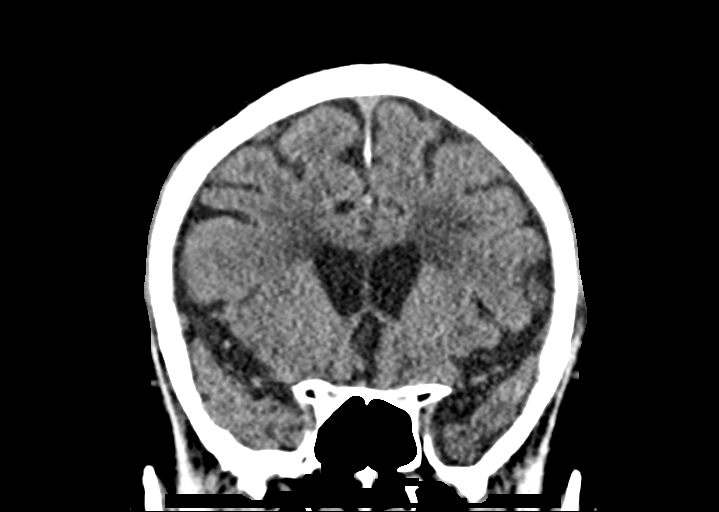
[im 33/73  brain]
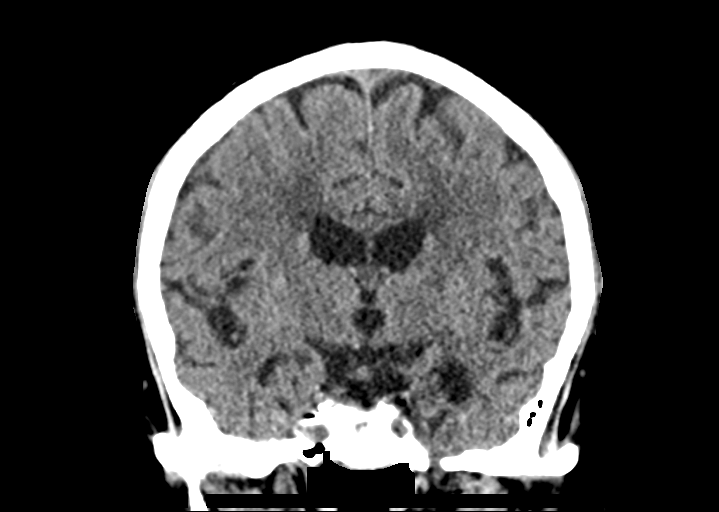
[im 41/73  brain]
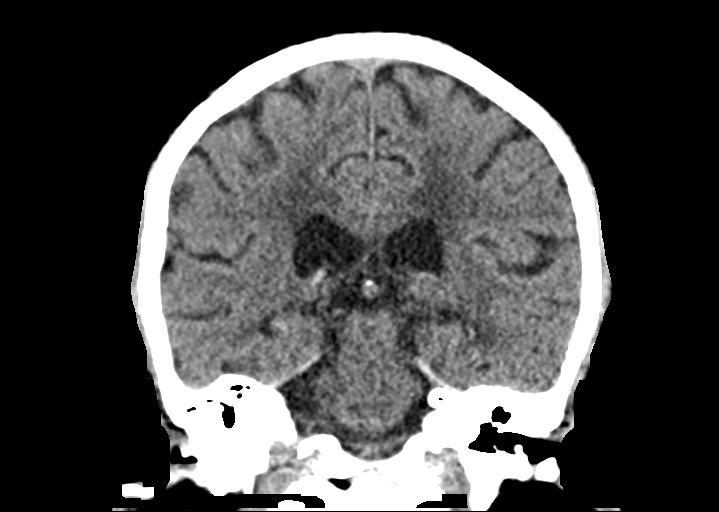

[Series 5: sag soft · sagittal · 0.30mm/px · 3 of 74 slices shown]
[im 25/74  brain]
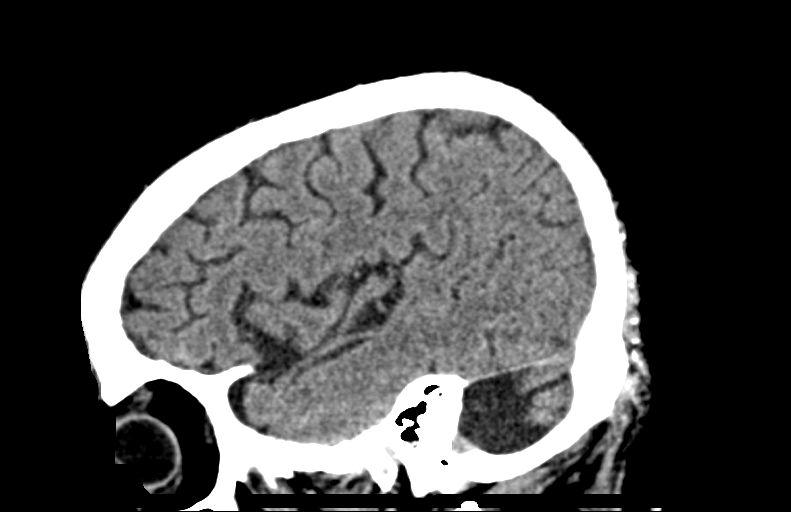
[im 37/74  brain]
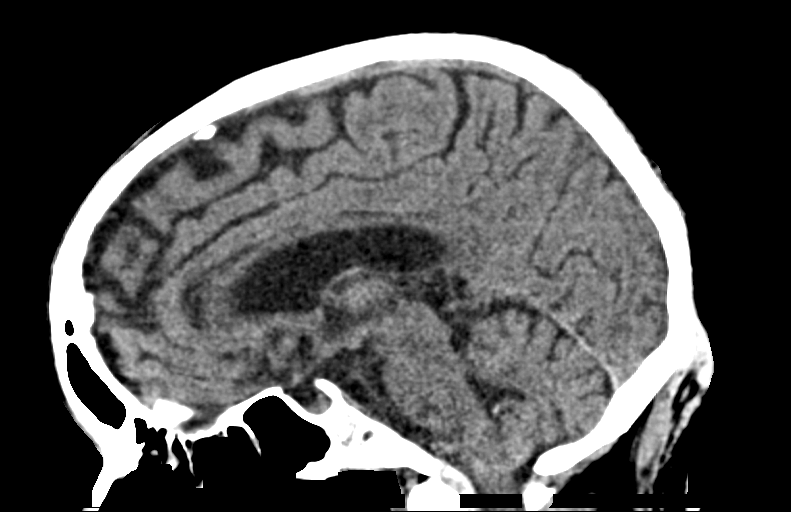
[im 49/74  brain]
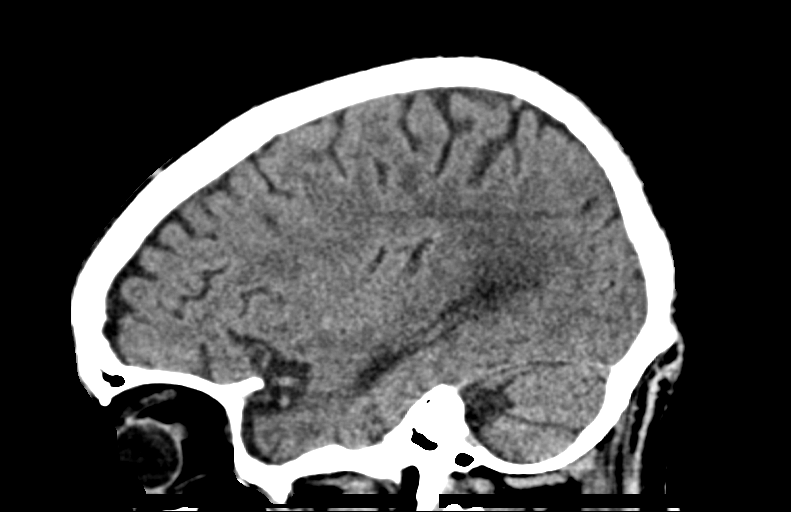

[14 of 47 positions shown; findings below may reference images not displayed]

FINDINGS: Brain: No evidence of acute infarction, hemorrhage, hydrocephalus,
extra-axial collection or mass lesion/mass effect. There is mild
diffuse cerebral volume loss with associated ex vacuo ventricular
dilatation.

Vascular: No hyperdense vessel or unexpected calcification.

Skull: Normal. Negative for fracture or focal lesion.

Sinuses/Orbits: No acute finding.

Other: None.
IMPRESSION: No acute intracranial process.

## 2019-02-21 IMAGING — CT CT HIP*L* W/O CM
2 of 3 series · 17 of 46 positions shown, 19 images · non-contrast
Comparison: None.

CLINICAL DATA: Head trauma, fracture suspected

EXAM:
CT OF THE LEFT HIP WITHOUT CONTRAST
TECHNIQUE: Multidetector CT imaging of the left hip was performed according to
the standard protocol. Multiplanar CT image reconstructions were
also generated.

[Series 5: axial soft tissue · axial · 0.42mm/px · z∈[-938,-722]mm · 14 of 124 slices shown, 16 images]
[im 8/124  soft-tissue]
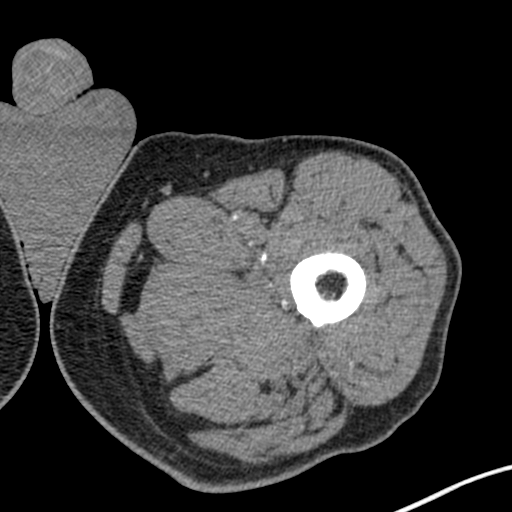
[im 8/124  bone]
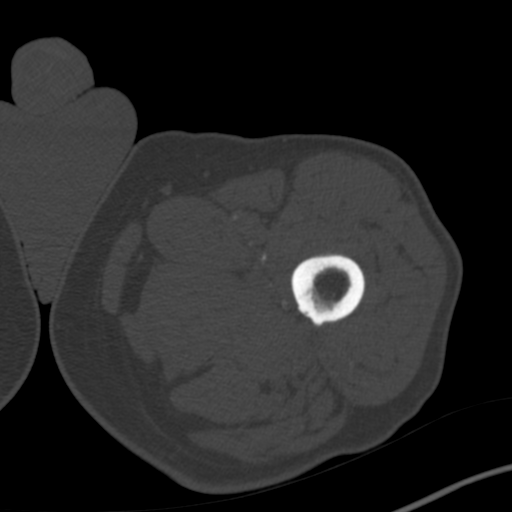
[im 16/124  soft-tissue]
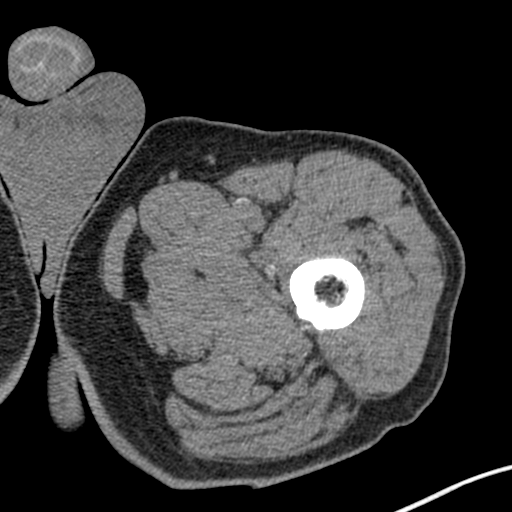
[im 24/124  soft-tissue]
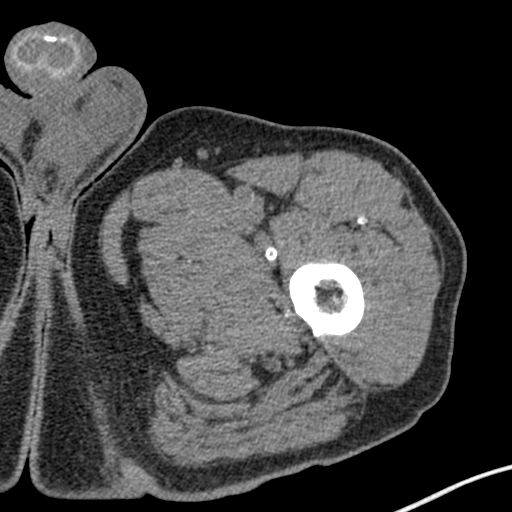
[im 32/124  soft-tissue]
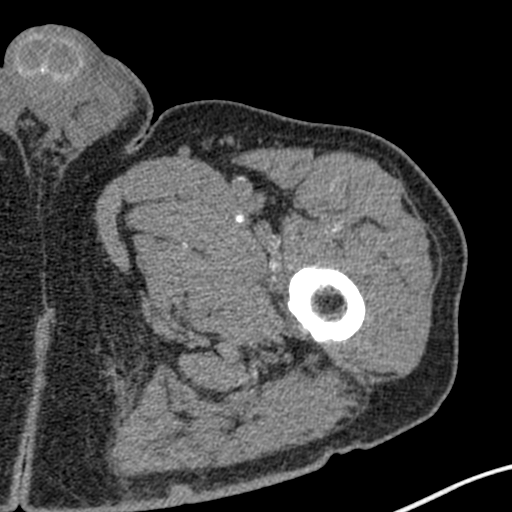
[im 40/124  soft-tissue]
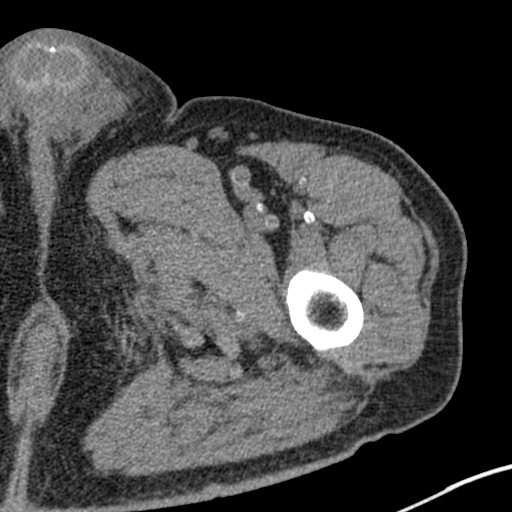
[im 48/124  soft-tissue]
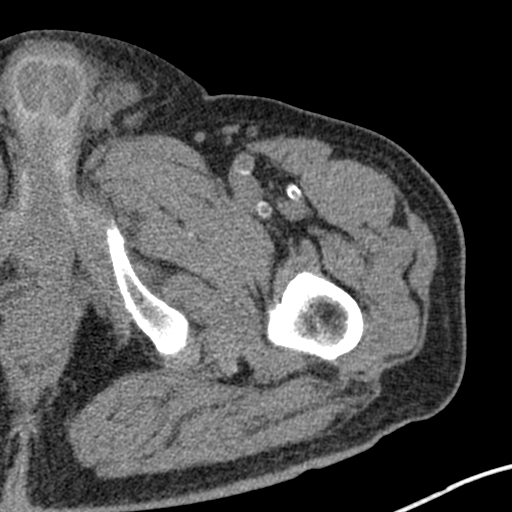
[im 56/124  soft-tissue]
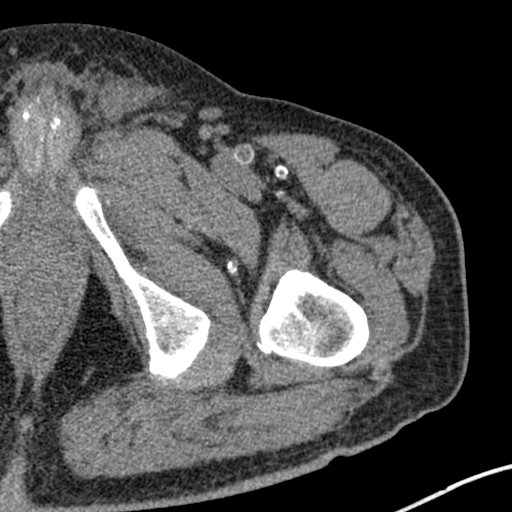
[im 68/124  soft-tissue]
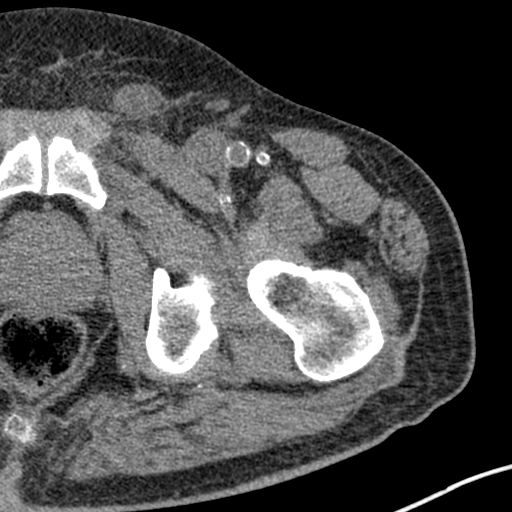
[im 76/124  soft-tissue]
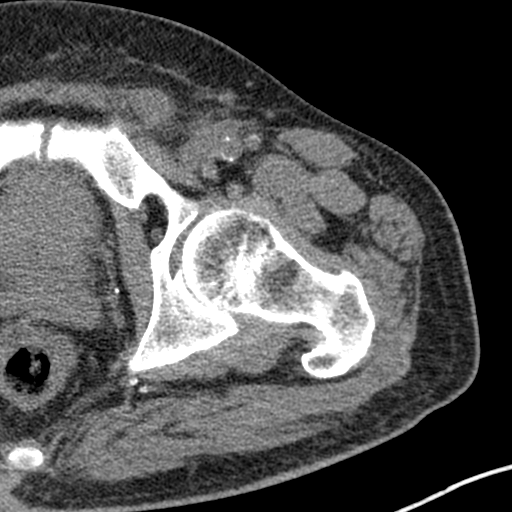
[im 76/124  bone]
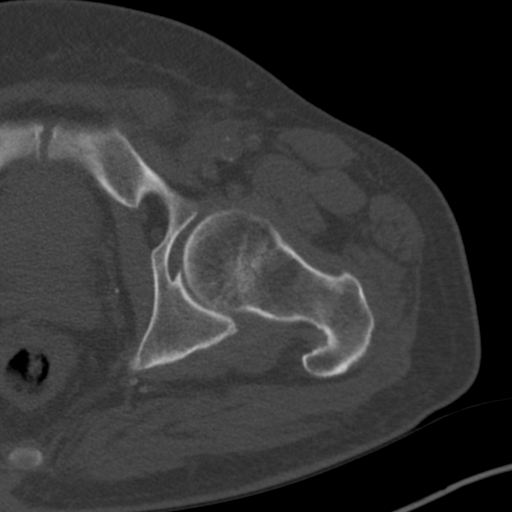
[im 84/124  soft-tissue]
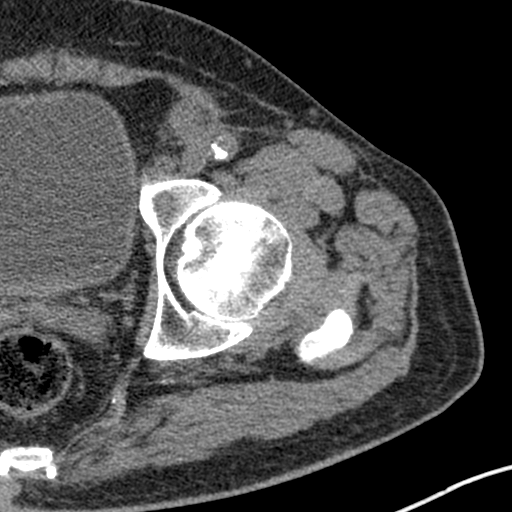
[im 92/124  soft-tissue]
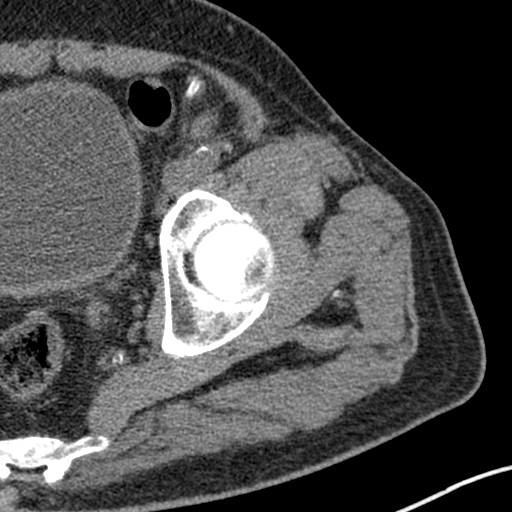
[im 100/124  soft-tissue]
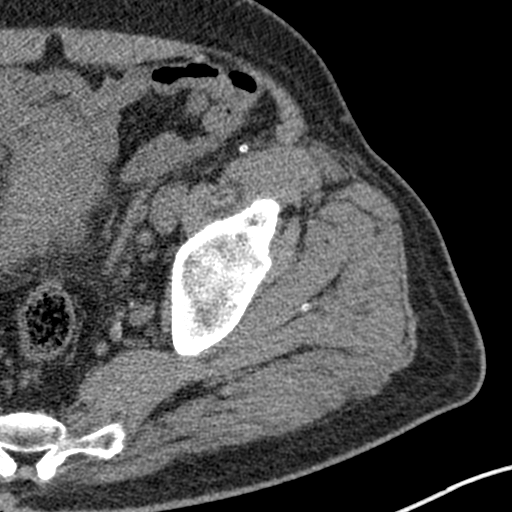
[im 108/124  soft-tissue]
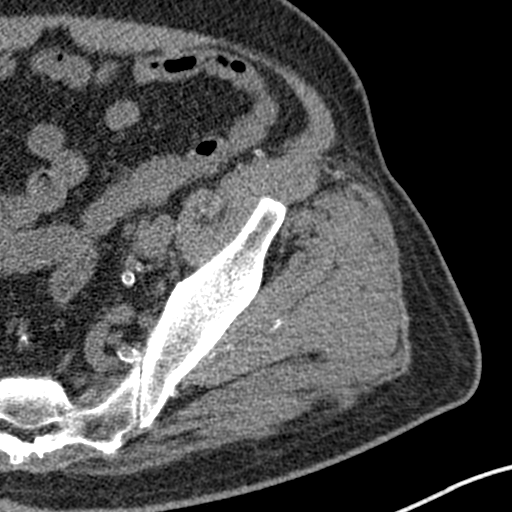
[im 116/124  soft-tissue]
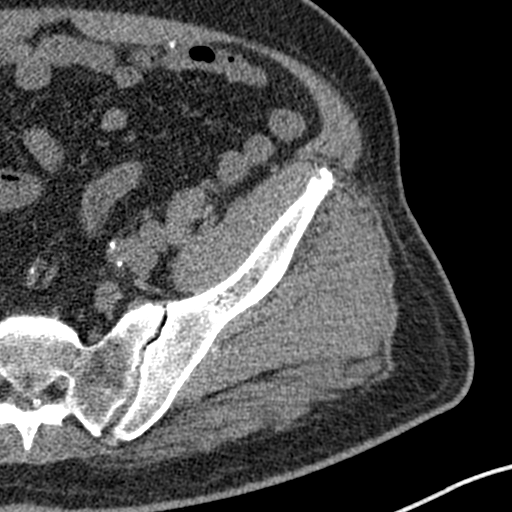

[Series 7: coronal st · coronal · 0.50mm/px · 3 of 85 slices shown]
[im 29/85  soft-tissue]
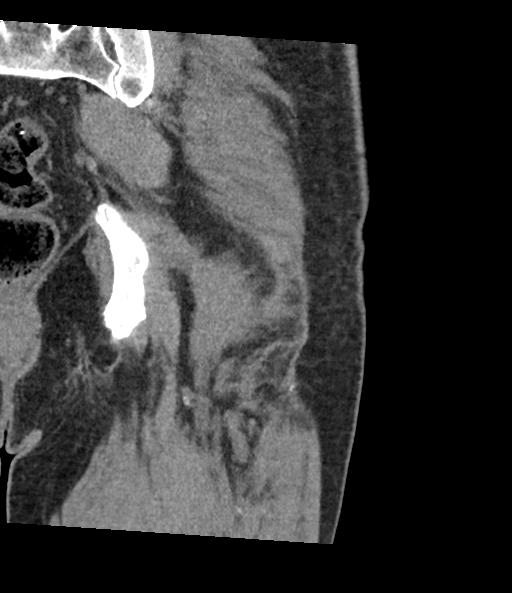
[im 38/85  soft-tissue]
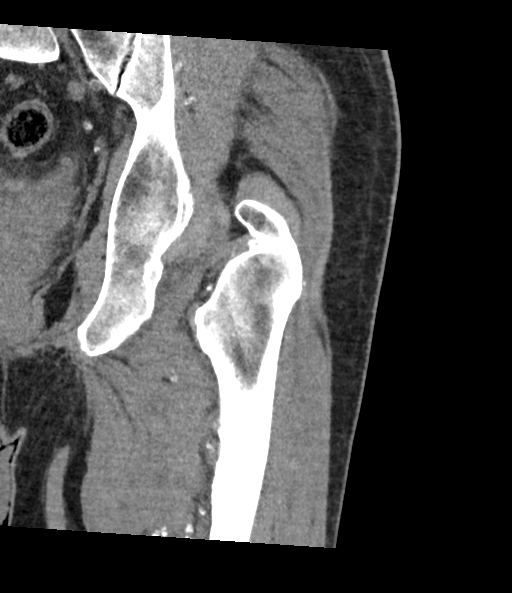
[im 47/85  soft-tissue]
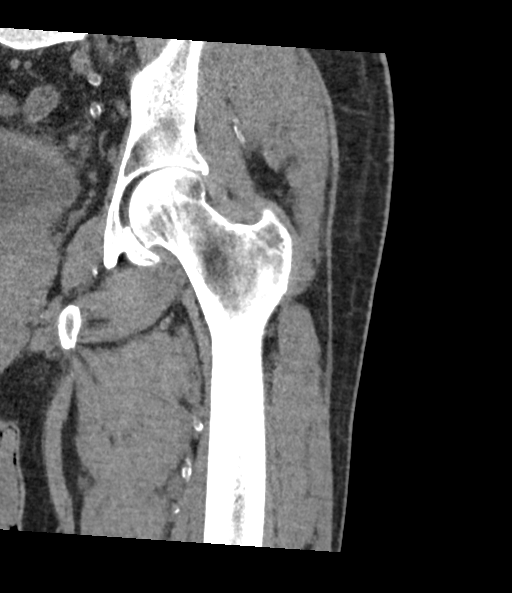

[17 of 46 positions shown; findings below may reference images not displayed]

FINDINGS: Bones/Joint/Cartilage

No fracture or dislocation. There is diffuse osteopenia.
Femoroacetabular joint osteoarthritis is seen with superior joint
space loss and subchondral sclerosis. The is a fights seen at the
greater trochanter posterior ischial tuberosity.

Ligaments

Suboptimally assessed by CT.

Muscles and Tendons

There is mild fatty atrophy of the muscles, however they appear to
be intact. The tendons appear to be intact.

Soft tissues

No focal soft tissue mass or soft tissue swelling. The visualized
deep pelvis is unremarkable.
IMPRESSION: No acute fracture or malalignment.

## 2019-02-21 MED ORDER — SODIUM CHLORIDE 0.9 % IV BOLUS
1000.0000 mL | Freq: Once | INTRAVENOUS | Status: AC
  Administered 2019-02-21: 1000 mL via INTRAVENOUS

## 2019-02-21 MED ORDER — SODIUM CHLORIDE 0.9 % IV SOLN
1.0000 g | Freq: Once | INTRAVENOUS | Status: AC
  Administered 2019-02-21: 1 g via INTRAVENOUS
  Filled 2019-02-21: qty 10

## 2019-02-21 MED ORDER — FENTANYL CITRATE (PF) 100 MCG/2ML IJ SOLN
25.0000 ug | Freq: Once | INTRAMUSCULAR | Status: AC
  Administered 2019-02-21: 25 ug via INTRAVENOUS
  Filled 2019-02-21: qty 2

## 2019-02-21 NOTE — Telephone Encounter (Signed)
Will need to follow up on this first of the week. He is not in ED yet.

## 2019-02-21 NOTE — ED Notes (Signed)
Pt was unable to provide urine at this time. Will try later.
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
## 2019-02-21 NOTE — Telephone Encounter (Signed)
Noted  
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
## 2019-02-21 NOTE — ED Notes (Signed)
[facility] notified (Tammy) - patient ready for transport 

## 2019-02-21 NOTE — ED Notes (Signed)
Patient transported to X-ray 

## 2019-02-21 NOTE — ED Notes (Signed)
RN at [facility] requesting this RN to verify if pt need tele. Per EDP- pt admission for med/obs and tele is not needed at this time. [facility] enroute for pt as planned.

## 2019-02-21 NOTE — ED Notes (Signed)
Pt on monitor 
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
## 2019-02-21 NOTE — ED Provider Notes (Signed)
Patient care assumed at 1500. Patient here for evaluation of left hip pain that began when sitting on Wednesday. He has also been feeling poorly for the last few days. Imaging and labs pending at this time.   UA is consistent with UTI, will start antibiotics and check lactate.  On repeat assessment he is having lower abdominal pain, lower left lower quadrant pain and a CT abdomen pelvis was obtained, which demonstrates colonic stool. On recheck he is complaining of left scrotal pain. On examination have scrotal edema with significant tenderness to palpation. An ultrasound was ordered.  Ultrasound demonstrates epididymitis. Given this finding in conjunction with UTI, leukocytosis and low blood pressure on ED presentation as well as patients difficulty on ambulation secondary pain will consult medicine service for observation admission. Hospitalist consulted for admission. Patient and wife updated findings of studies recommendation for mission and their agreement with treatment plan.   Quintella Reichert, MD 02/22/19 [phone]

## 2019-02-21 NOTE — ED Notes (Signed)
[facility] arrived to transfer pt to [facility] 

## 2019-02-21 NOTE — ED Notes (Signed)
Pt to room 12, assisted into bed from w/c x 3 staff max assist.

## 2019-02-21 NOTE — ED Triage Notes (Signed)
Presents with left hip and abdominal pain that began Wednesday. Pt reports the pain began when he went to sit down and heard a loud crack and felt pain. Since he has been having difficulty with left hip and abdominal pain and increased fatigue.

## 2019-02-21 NOTE — ED Notes (Signed)
Patient transported to CT 

## 2019-02-21 NOTE — Progress Notes (Signed)
Patient ID: Jermaine Mitchell, male   DOB: 1941/02/18, 78 y.o.   MRN: [id number]  78 yo male with hypertension, hyperlipidemia, Dm2, elevated Psa, anemia, apparently c/o L hip pain, L abdominal pain, found to have acute lower UTI, and epidydmitis.  ED requesting admission due to initial low bp 98/50, currently 141/85  .   Pt started on Rocephin.

## 2019-02-21 NOTE — ED Notes (Signed)
Pt wife updated with room number and transfer at this time.

## 2019-02-22 ENCOUNTER — Observation Stay (HOSPITAL_COMMUNITY): Payer: Medicare HMO

## 2019-02-22 ENCOUNTER — Encounter (HOSPITAL_COMMUNITY): Payer: Self-pay | Admitting: Internal Medicine

## 2019-02-22 DIAGNOSIS — I5032 Chronic diastolic (congestive) heart failure: Secondary | ICD-10-CM | POA: Diagnosis not present

## 2019-02-22 DIAGNOSIS — I10 Essential (primary) hypertension: Secondary | ICD-10-CM

## 2019-02-22 DIAGNOSIS — R531 Weakness: Secondary | ICD-10-CM

## 2019-02-22 DIAGNOSIS — N451 Epididymitis: Secondary | ICD-10-CM | POA: Diagnosis not present

## 2019-02-22 DIAGNOSIS — E782 Mixed hyperlipidemia: Secondary | ICD-10-CM | POA: Diagnosis not present

## 2019-02-22 DIAGNOSIS — R1032 Left lower quadrant pain: Secondary | ICD-10-CM | POA: Diagnosis not present

## 2019-02-22 DIAGNOSIS — K5900 Constipation, unspecified: Secondary | ICD-10-CM | POA: Diagnosis not present

## 2019-02-22 DIAGNOSIS — D649 Anemia, unspecified: Secondary | ICD-10-CM

## 2019-02-22 DIAGNOSIS — N390 Urinary tract infection, site not specified: Secondary | ICD-10-CM | POA: Diagnosis not present

## 2019-02-22 DIAGNOSIS — R5381 Other malaise: Secondary | ICD-10-CM | POA: Diagnosis not present

## 2019-02-22 DIAGNOSIS — M25552 Pain in left hip: Secondary | ICD-10-CM | POA: Diagnosis not present

## 2019-02-22 LAB — COMPREHENSIVE METABOLIC PANEL
ALT: 15 U/L (ref 0–44)
AST: 20 U/L (ref 15–41)
Albumin: 2.8 g/dL — ABNORMAL LOW (ref 3.5–5.0)
Alkaline Phosphatase: 76 U/L (ref 38–126)
Anion gap: 10 (ref 5–15)
BUN: 60 mg/dL — ABNORMAL HIGH (ref 8–23)
CO2: 19 mmol/L — ABNORMAL LOW (ref 22–32)
Calcium: 8.6 mg/dL — ABNORMAL LOW (ref 8.9–10.3)
Chloride: 109 mmol/L (ref 98–111)
Creatinine, Ser: 2.83 mg/dL — ABNORMAL HIGH (ref 0.61–1.24)
GFR calc Af Amer: 24 mL/min — ABNORMAL LOW (ref >60)
GFR calc non Af Amer: 21 mL/min — ABNORMAL LOW (ref >60)
Glucose, Bld: 264 mg/dL — ABNORMAL HIGH (ref 70–99)
Potassium: 3.9 mmol/L (ref 3.5–5.1)
Sodium: 138 mmol/L (ref 135–145)
Total Bilirubin: 0.4 mg/dL (ref 0.3–1.2)
Total Protein: 7.4 g/dL (ref 6.5–8.1)

## 2019-02-22 LAB — GLUCOSE, CAPILLARY
Glucose-Capillary: 110 mg/dL — ABNORMAL HIGH (ref 70–99)
Glucose-Capillary: 122 mg/dL — ABNORMAL HIGH (ref 70–99)
Glucose-Capillary: 155 mg/dL — ABNORMAL HIGH (ref 70–99)
Glucose-Capillary: 198 mg/dL — ABNORMAL HIGH (ref 70–99)
Glucose-Capillary: 227 mg/dL — ABNORMAL HIGH (ref 70–99)
Glucose-Capillary: 231 mg/dL — ABNORMAL HIGH (ref 70–99)
Glucose-Capillary: 249 mg/dL — ABNORMAL HIGH (ref 70–99)
Glucose-Capillary: 90 mg/dL (ref 70–99)

## 2019-02-22 LAB — BLOOD CULTURE ID PANEL (REFLEXED)

## 2019-02-22 LAB — CBC
HCT: 32.9 % — ABNORMAL LOW (ref 39.0–52.0)
Hemoglobin: 10.7 g/dL — ABNORMAL LOW (ref 13.0–17.0)
MCH: 27.4 pg (ref 26.0–34.0)
MCHC: 32.5 g/dL (ref 30.0–36.0)
MCV: 84.1 fL (ref 80.0–100.0)
Platelets: 264 10*3/uL (ref 150–400)
RBC: 3.91 MIL/uL — ABNORMAL LOW (ref 4.22–5.81)
RDW: 13.4 % (ref 11.5–15.5)
WBC: 13.8 10*3/uL — ABNORMAL HIGH (ref 4.0–10.5)
nRBC: 0 % (ref 0.0–0.2)

## 2019-02-22 LAB — MAGNESIUM: Magnesium: 2.3 mg/dL (ref 1.7–2.4)

## 2019-02-22 LAB — TSH: TSH: 0.652 u[IU]/mL (ref 0.350–4.500)

## 2019-02-22 LAB — PHOSPHORUS: Phosphorus: 3 mg/dL (ref 2.5–4.6)

## 2019-02-22 LAB — HEMOGLOBIN A1C
Hgb A1c MFr Bld: 10 % — ABNORMAL HIGH (ref 4.8–5.6)
Mean Plasma Glucose: 240.3 mg/dL

## 2019-02-22 IMAGING — DX DG ABDOMEN 1V
2 series · 2 of 2 positions shown · non-contrast
Comparison: CT abdomen dated [DATE].

CLINICAL DATA: LEFT lower quadrant pain

EXAM:
ABDOMEN - 1 VIEW

[abdomen kub (1 of 2)]
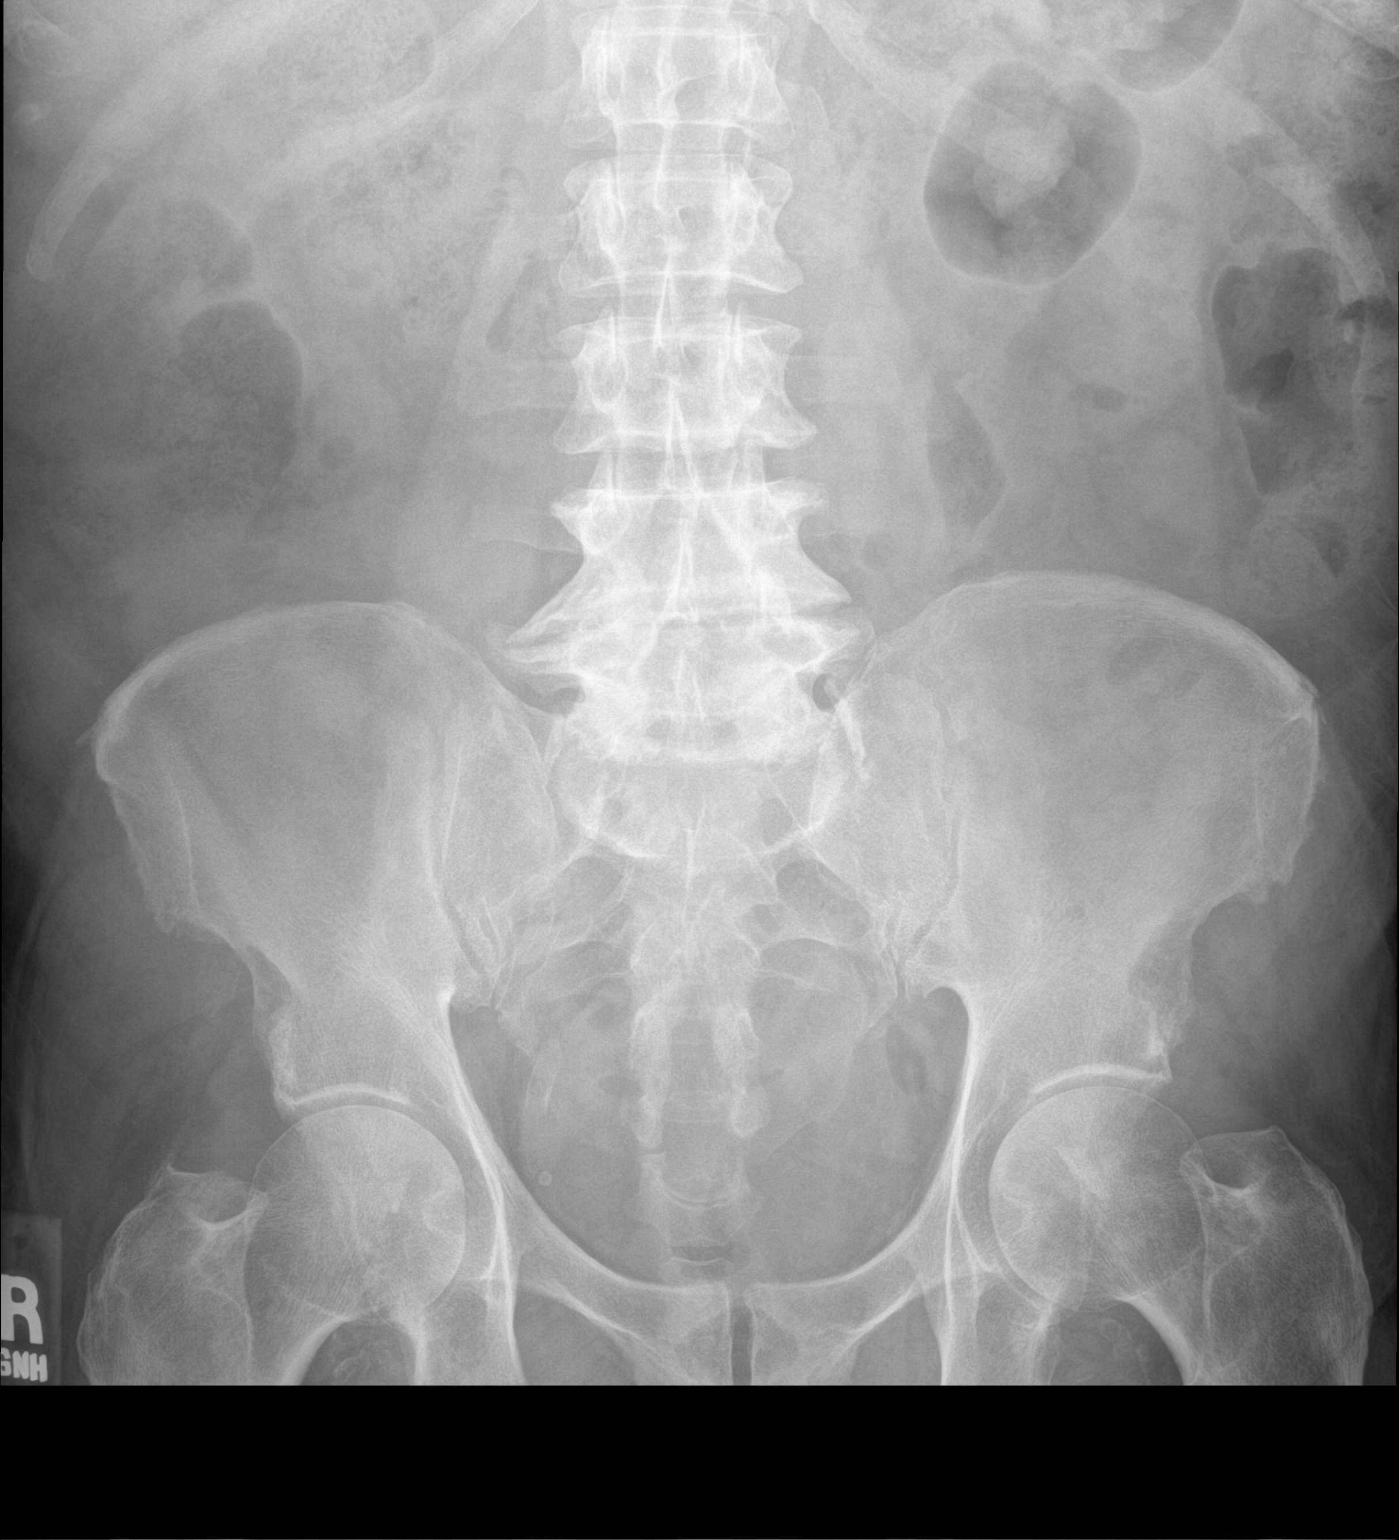

[abdomen kub (2 of 2)]
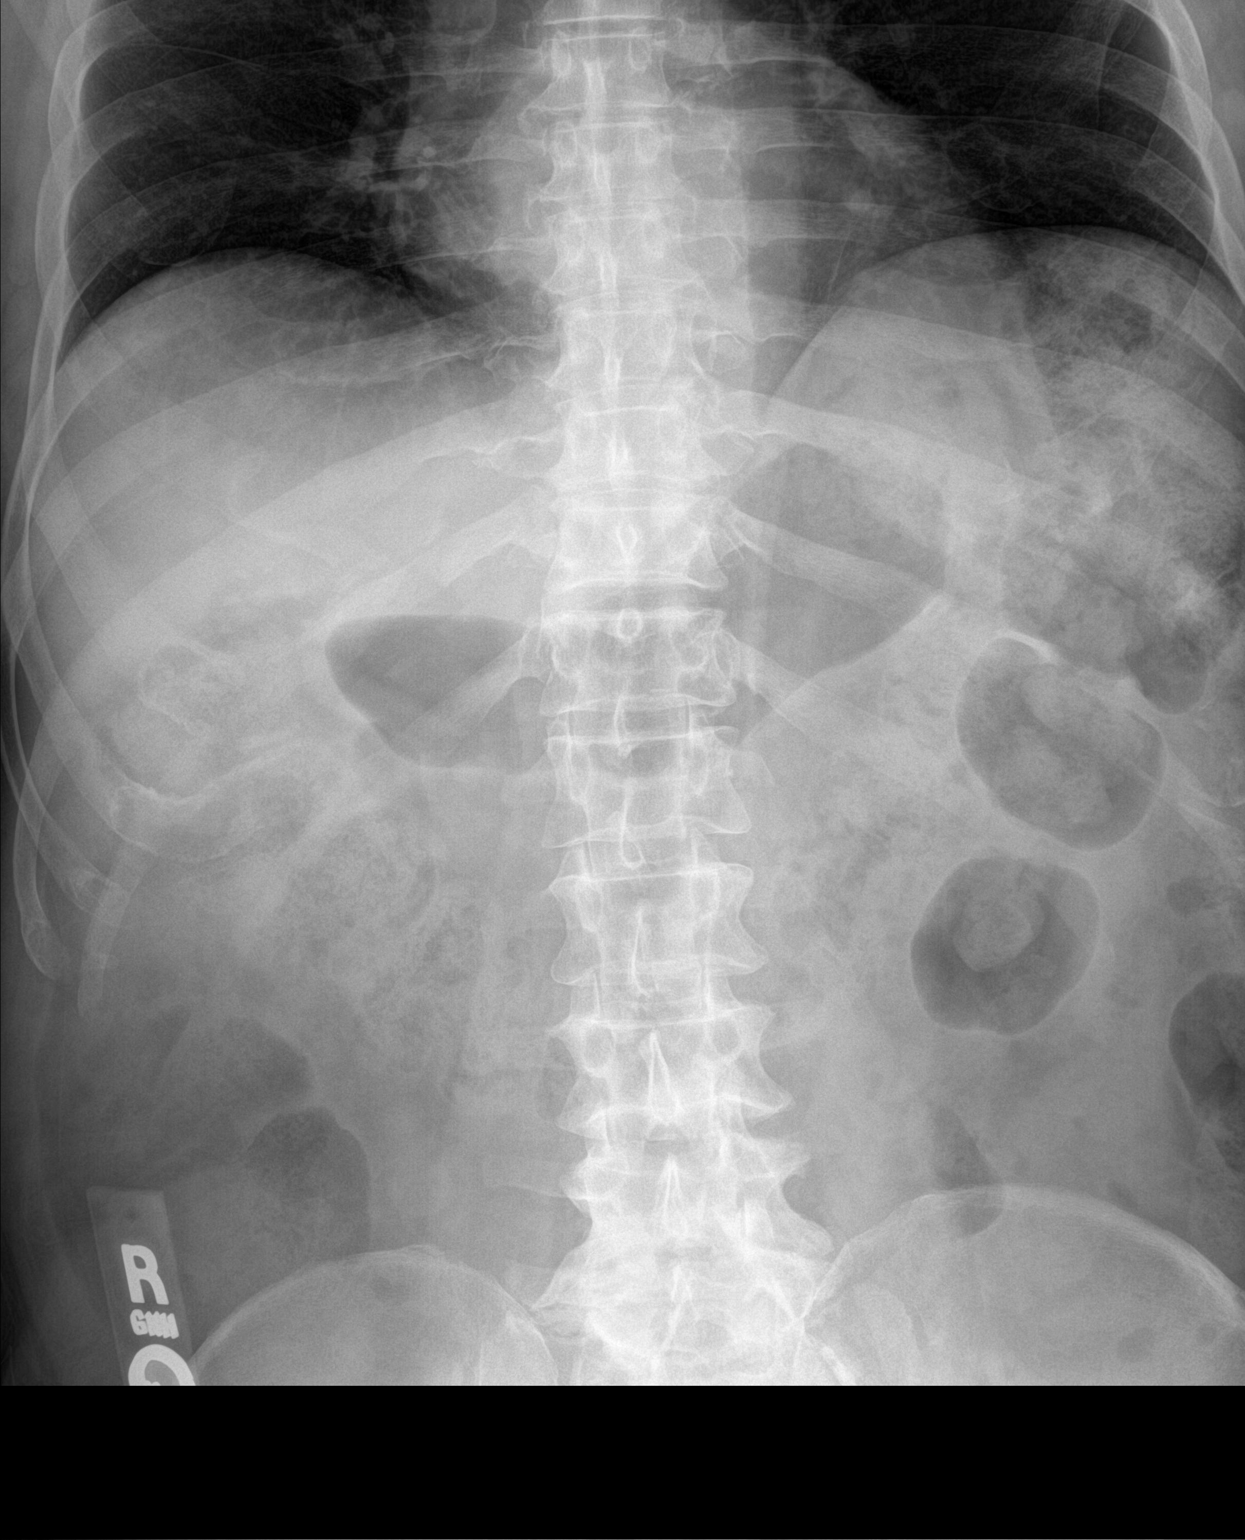

[2 of 2 positions shown; findings below may reference images not displayed]

FINDINGS: Visualized bowel gas pattern is nonobstructive. Moderate amount of
stool and gas throughout the colon. No evidence of soft tissue mass
or abnormal fluid collection. No evidence of free intraperitoneal
air. No acute appearing osseous abnormality. Lung bases are grossly
clear.

Questionable layering debris versus bladder wall thickening within
the bladder on comparison CT.
IMPRESSION: 1. Nonobstructive bowel gas pattern.
2. Moderate amount of stool and gas throughout the colon.
3. Questionable layering debris within the bladder versus asymmetric
bladder wall thickening on comparison CT. Recommend correlation with
urinalysis and would consider cystoscopy if urinalysis was
inconclusive.

## 2019-02-22 MED ORDER — INSULIN NPH (HUMAN) (ISOPHANE) 100 UNIT/ML ~~LOC~~ SUSP
20.0000 [IU] | Freq: Every day | SUBCUTANEOUS | Status: DC
  Administered 2019-02-22 – 2019-02-23 (×2): 20 [IU] via SUBCUTANEOUS
  Filled 2019-02-22: qty 10

## 2019-02-22 MED ORDER — ONDANSETRON HCL 4 MG/2ML IJ SOLN: 4.0000 mg | Freq: Four times a day (QID) | INTRAMUSCULAR | Status: DC | PRN

## 2019-02-22 MED ORDER — ACETAMINOPHEN 325 MG PO TABS
650.0000 mg | ORAL_TABLET | Freq: Four times a day (QID) | ORAL | Status: DC | PRN
  Administered 2019-02-22: 325 mg via ORAL
  Administered 2019-02-22: 650 mg via ORAL
  Filled 2019-02-22 (×3): qty 2

## 2019-02-22 MED ORDER — ACETAMINOPHEN 650 MG RE SUPP: 650.0000 mg | Freq: Four times a day (QID) | RECTAL | Status: DC | PRN

## 2019-02-22 MED ORDER — ASPIRIN EC 81 MG PO TBEC
81.0000 mg | DELAYED_RELEASE_TABLET | Freq: Every day | ORAL | Status: DC
  Administered 2019-02-22 – 2019-02-23 (×2): 81 mg via ORAL
  Filled 2019-02-22 (×2): qty 1

## 2019-02-22 MED ORDER — INSULIN ASPART 100 UNIT/ML ~~LOC~~ SOLN
0.0000 [IU] | SUBCUTANEOUS | Status: DC
  Administered 2019-02-22 (×2): 3 [IU] via SUBCUTANEOUS
  Administered 2019-02-22: 2 [IU] via SUBCUTANEOUS
  Administered 2019-02-22: 3 [IU] via SUBCUTANEOUS
  Administered 2019-02-22 – 2019-02-23 (×2): 2 [IU] via SUBCUTANEOUS

## 2019-02-22 MED ORDER — SENNA 8.6 MG PO TABS
1.0000 | ORAL_TABLET | Freq: Two times a day (BID) | ORAL | Status: DC
  Administered 2019-02-22 – 2019-02-23 (×4): 8.6 mg via ORAL
  Filled 2019-02-22 (×4): qty 1

## 2019-02-22 MED ORDER — AMLODIPINE BESYLATE 10 MG PO TABS
10.0000 mg | ORAL_TABLET | Freq: Every day | ORAL | Status: DC
  Administered 2019-02-22 – 2019-02-23 (×2): 10 mg via ORAL
  Filled 2019-02-22 (×2): qty 1

## 2019-02-22 MED ORDER — LORATADINE 10 MG PO TABS
10.0000 mg | ORAL_TABLET | Freq: Every day | ORAL | Status: DC
  Administered 2019-02-22 – 2019-02-23 (×2): 10 mg via ORAL
  Filled 2019-02-22 (×2): qty 1

## 2019-02-22 MED ORDER — BISACODYL 10 MG RE SUPP: 10.0000 mg | Freq: Every day | RECTAL | Status: DC | PRN

## 2019-02-22 MED ORDER — CARVEDILOL 25 MG PO TABS
25.0000 mg | ORAL_TABLET | Freq: Two times a day (BID) | ORAL | Status: DC
  Administered 2019-02-22 – 2019-02-23 (×3): 25 mg via ORAL
  Filled 2019-02-22 (×3): qty 1

## 2019-02-22 MED ORDER — HYDROCODONE-ACETAMINOPHEN 5-325 MG PO TABS: 1.0000 | ORAL_TABLET | ORAL | Status: DC | PRN

## 2019-02-22 MED ORDER — ATORVASTATIN CALCIUM 40 MG PO TABS
80.0000 mg | ORAL_TABLET | Freq: Every day | ORAL | Status: DC
  Administered 2019-02-22 – 2019-02-23 (×2): 80 mg via ORAL
  Filled 2019-02-22 (×2): qty 2

## 2019-02-22 MED ORDER — POLYETHYLENE GLYCOL 3350 17 G PO PACK
17.0000 g | PACK | Freq: Every day | ORAL | Status: DC | PRN
  Administered 2019-02-22: 17 g via ORAL
  Filled 2019-02-22: qty 1

## 2019-02-22 MED ORDER — MILK AND MOLASSES ENEMA
1.0000 | Freq: Once | RECTAL | Status: AC
  Administered 2019-02-22: 240 mL via RECTAL
  Filled 2019-02-22: qty 240

## 2019-02-22 MED ORDER — HYDRALAZINE HCL 20 MG/ML IJ SOLN
5.0000 mg | Freq: Four times a day (QID) | INTRAMUSCULAR | Status: DC | PRN
  Administered 2019-02-23: 5 mg via INTRAVENOUS
  Filled 2019-02-22: qty 1

## 2019-02-22 MED ORDER — SODIUM CHLORIDE 0.9 % IV SOLN
1.0000 g | INTRAVENOUS | Status: DC
  Administered 2019-02-22: 1 g via INTRAVENOUS
  Filled 2019-02-22: qty 1

## 2019-02-22 MED ORDER — ONDANSETRON HCL 4 MG PO TABS: 4.0000 mg | ORAL_TABLET | Freq: Four times a day (QID) | ORAL | Status: DC | PRN

## 2019-02-22 NOTE — Evaluation (Signed)
Occupational Therapy Evaluation Patient Details Name: Jermaine Mitchell MRN: [id number] DOB: 31-Jul-1940 Today's Date: 02/22/2019    History of Present Illness 78 y.o. male with medical history significant of hypertension, hyperlipidemia, Dm2, elevated Psa, anemia , chronic diastolic CHF, chronic kidney disease stage IV, HLD,  prolonged QT, PAD.   left hip pain after he had tried to sit down in a chair and heard a loud crack in his hip area and was worried about fx. CT hip  negative. pt admitted with UTI   Clinical Impression   Pt was admitted for the above.  He reports that he is mod I for adls at baseline. He tends to move quickly and needs cues for safety.  He needed light min A to stand and steady and will need wife to guard him initially when home.  Will follow in acute setting with supervision level goals.    Follow Up Recommendations  Home health OT;Supervision/Assistance - 24 hour    Equipment Recommendations  None recommended by OT    Recommendations for Other Services       Precautions / Restrictions Precautions Precautions: Fall Restrictions Weight Bearing Restrictions: No      Mobility Bed Mobility Overal bed mobility: Needs Assistance Bed Mobility: Supine to Sit;Sit to Supine     Supine to sit: Min guard Sit to supine: Min assist   General bed mobility comments: min guard for safety; min A for bil LEs back to bed  Transfers Overall transfer level: Needs assistance Equipment used: Rolling walker (2 wheeled) Transfers: Sit to/from Stand Sit to Stand: Min assist         General transfer comment: steadying assistance and cues for UE placement    Balance Overall balance assessment: Needs assistance         Standing balance support: During functional activity;Bilateral upper extremity supported Standing balance-Leahy Scale: Poor Standing balance comment: reliant on UEs and external assist                           ADL either performed or  assessed with clinical judgement   ADL Overall ADL's : Needs assistance/impaired Eating/Feeding: Independent   Grooming: Set up   Upper Body Bathing: Set up   Lower Body Bathing: Minimal assistance;Sit to/from stand   Upper Body Dressing : Set up   Lower Body Dressing: Minimal assistance;Sit to/from stand                       Vision Patient Visual Report: (asked for bright light to see better)       Perception     Praxis      Pertinent Vitals/Pain Pain Assessment: No/denies pain Faces Pain Scale: Hurts a little bit Pain Location: left hip pain initially--no c/o with amb Pain Descriptors / Indicators: Grimacing Pain Intervention(s): Limited activity within patient's tolerance;Monitored during session;Repositioned     Hand Dominance     Extremity/Trunk Assessment Upper Extremity Assessment Upper Extremity Assessment: Overall WFL for tasks assessed          Communication Communication Communication: No difficulties   Cognition Arousal/Alertness: Awake/alert Behavior During Therapy: Impulsive Overall Cognitive Status: No family/caregiver present to determine baseline cognitive functioning Area of Impairment: Following commands;Safety/judgement                        Safety/Judgement: Decreased awareness of safety;Decreased awareness of deficits        General Comments  Exercises     Shoulder Instructions      Home Living Family/patient expects to be discharged to:: Private residence Living Arrangements: Spouse/significant other;Children Available Help at Discharge: Family;Available 24 hours/day Type of Home: House Home Access: Stairs to enter [employer] of Steps: 4   Home Layout: One level     Bathroom Shower/Tub: Occupational psychologist: Handicapped height     Home Equipment: [place] - single point;Walker - 2 wheels   Additional Comments: pt stood in shower      Prior Functioning/Environment  Level of Independence: Independent with assistive device(s)        Comments: wife does the homemaking, doesn't drive        OT Problem List: Decreased strength;Decreased activity tolerance;Impaired balance (sitting and/or standing);Decreased safety awareness      OT Treatment/Interventions: Self-care/ADL training;DME and/or AE instruction;Patient/family education;Balance training;Therapeutic activities    OT Goals(Current goals can be found in the care plan section) Acute Rehab OT Goals Patient Stated Goal: home OT Goal Formulation: With patient Time For Goal Achievement: 03/01/19 Potential to Achieve Goals: Good ADL Goals Pt Will Transfer to Toilet: with supervision;ambulating Pt Will Perform Toileting - Clothing Manipulation and hygiene: with supervision Additional ADL Goal #1: pt will complete adl with set up and supervision  OT Frequency: Min 2X/week   Barriers to D/C:            Co-evaluation              AM-PAC OT "6 Clicks" Daily Activity     Outcome Measure Help from another person eating meals?: None Help from another person taking care of personal grooming?: A Little Help from another person toileting, which includes using toliet, bedpan, or urinal?: A Little Help from another person bathing (including washing, rinsing, drying)?: A Little Help from another person to put on and taking off regular upper body clothing?: A Little Help from another person to put on and taking off regular lower body clothing?: A Little 6 Click Score: 19   End of Session    Activity Tolerance: Patient tolerated treatment well Patient left: in bed;with call bell/phone within reach  OT Visit Diagnosis: Unsteadiness on feet (R26.81)                Time: 1247-1300 OT Time Calculation (min): 13 min Charges:  OT General Charges $OT Visit: 1 Visit OT Evaluation $OT Eval Low Complexity: [place], OTR/L Acute Rehabilitation Services [phone] [facility] pager [phone]  office 02/22/2019  [place] 02/22/2019, 2:56 PM
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
## 2019-02-22 NOTE — Progress Notes (Signed)
[facility]  PROGRESS NOTE  Narinder Dotts [id number] DOB: 10-Aug-1940 DOA: 02/21/2019 PCP: Mosie Lukes, MD  Brief History    Jermaine Mitchell is a 78 y.o. year old male with medical history significant for hypertension, hyperlipidemia, Dm2, elevated Psa, anemia , chronic diastolic CHF, chronic kidney disease stage IV HLD  prolonged QT, PAD who presented on 02/21/2019 with left hip pain after noticing a loud crack while sitting down and was found to have acute epididymitis, obstipation, transient hypotension.  ED course: Left hip x-ray negative for acute fracture.  Ultrasound for evaluation of left scrotal pain and edema consistent with epididymitis, started on IV ceftriaxone in ED.  UA consistent with UTI.  CT abdomen/pelvis for evaluation of abdominal discomfort showed colonic stool burden BP 98/50, with evidence of dehydration on exam patient required IV fluids and was admitted under observation.    A & P     Acute left epididymitis/UTI.  Given age doubt STI, has 1 partner (wife).  Seems to be responding well to IV ceftriaxone, currently awaiting urine culture to narrow antibiotic.   Transient hypotension, improved.  Likely due to dehydration in the setting of increased pain related to epididymitis and obstipation, also complicated by continued use of home diuretics., required IV fluids on admission.  Has had no other signs or symptoms of sepsis physiology, blood cultures unremarkable, seems to be maintaining BP with oral diet, closely monitor off of BP/diuretic regimen   Abdominal pain, improving.  Likely related to colonic burden.  Improved with BMs on current bowel regimen.  Other work-up was negative (CMP, lipase, CT abdomen unremarkable).  Eating well currently without any nausea/vomiting.   Hip pain no evidence of fracture on x-ray or CT imaging.  PT OT recommend home health therapy   Type 2 diabetes, A1c 10.  Insulin initially decreased given n.p.o./diminished oral  intake.  Appetite now improved, last CBG 122, continue to closely monitor as appetite improves.   Chronic CHF with preserved EF.  Does not look volume overloaded.  Currently holding diuretics given hypotension on admission   Hypertension.  BP regimen was initially held due to SBP's in the 80s.  Since then has persistently been in the 140s to 160s.  Will resume home amlodipine now, continue Coreg   CAD, stable.  Asymptomatic.  Held home Imdur due to hypotension on admission.  Will likely resume on discharge.   Hyperlipidemia, stable.  Continue home atorvastatin.   CKD, stage IV.  Baseline creatinine 3-3 0.22.  Currently 2.3.  Avoid nephrotoxins, closely monitor output.     DVT prophylaxis: SCDs Code Status: Full code Family Communication: No family at bedside Disposition Plan: Admit as observation this morning, anticipate discharge in 24 hours if continues to remain clinically stable    [facility] Direct contact: see [email or web address] (further directions at bottom of note if needed) 7PM-7AM contact night coverage as at bottom of note 02/22/2019, 3:57 PM  LOS: 0 days   Consultants  . None  Procedures  . None  Antibiotics  . IV ceftriaxone, 9/19  Interval History/Subjective  Feeling well Did well with breakfast Had several bowel movements overnight and this morning Feels well the pain is much better  Objective   Vitals:  Vitals:   02/22/19 0957 02/22/19 1422  BP: (!) 157/81 (!) 164/81  Pulse: 77 75  Resp: 16 15  Temp: 97.9 F (36.6 C) 97.6 F (36.4 C)  SpO2: 100% 99%    Exam:  Awake Alert, Oriented X 3, No new  F.N deficits, Normal affect [place].AT Symmetrical Chest wall movement, Good air movement bilaterally, CTAB RRR,No Gallops,Rubs or new Murmurs, No Parasternal Heave +ve B.Sounds, Abd Soft, No tenderness,No rebound - guarding or rigidity. Notable swelling in left scrotal area, exquisitely tender to touch, no overt erythema, no drainage        Jermaine Mitchell  [facility]

## 2019-02-22 NOTE — Evaluation (Signed)
Physical Therapy Evaluation Patient Details Name: Jermaine Mitchell MRN: [id number] DOB: [birth year] Today's Date: 02/22/2019   History of Present Illness  78 y.o. male with medical history significant of hypertension, hyperlipidemia, Dm2, elevated Psa, anemia , chronic diastolic CHF, chronic kidney disease stage IV, HLD,  prolonged QT, PAD.   left hip pain after he had tried to sit down in a chair and heard a loud crack in his hip area and was worried about fx. CT hip  negative. pt admitted iwth UTI  Clinical Impression  Pt admitted with above diagnosis.  Pt will benefit from PT in acute setting. Pt with unsteady, ataxic gait placing him at risk for falls; may benefit from HHPT at d/c depending on progress, pt may be near his functional baseline Pt currently with functional limitations due to the deficits listed below (see PT Problem List). Pt will benefit from skilled PT to increase their independence and safety with mobility to allow discharge to the venue listed below.       Follow Up Recommendations Home health PT;Supervision for mobility/OOB    Equipment Recommendations  None recommended by PT    Recommendations for Other Services       Precautions / Restrictions Precautions Precautions: Fall Restrictions Weight Bearing Restrictions: No      Mobility  Bed Mobility Overal bed mobility: Needs Assistance Bed Mobility: Supine to Sit;Sit to Supine     Supine to sit: Min guard Sit to supine: Min guard   General bed mobility comments: 2 attempts, pt initially returned to supine d/t hip pain. min/guard for safety, incr time, effortful transition  Transfers Overall transfer level: Needs assistance Equipment used: Rolling walker (2 wheeled) Transfers: Sit to/from Stand Sit to Stand: Min guard;Min assist         General transfer comment: cues for hand placement  Ambulation/Gait Ambulation/Gait assistance: Min assist   Assistive device: Rolling walker (2 wheeled) Gait  Pattern/deviations: Step-through pattern;Decreased stride length;Wide base of support;Ataxic     General Gait Details: pt with unsteady, ataxic gait but no overt LOB. requires repetitious cues  for RW position from self (tends to position RW too far forward)  Science writer    Modified Rankin (Stroke Patients Only)       Balance Overall balance assessment: Needs assistance         Standing balance support: During functional activity;Bilateral upper extremity supported Standing balance-Leahy Scale: Poor Standing balance comment: reliant on UEs and external assist                             Pertinent Vitals/Pain Pain Assessment: Faces Faces Pain Scale: Hurts a little bit Pain Location: left hip pain initially--no c/o with amb Pain Descriptors / Indicators: Grimacing Pain Intervention(s): Limited activity within patient's tolerance;Monitored during session;Repositioned    Home Living Family/patient expects to be discharged to:: Private residence Living Arrangements: Spouse/significant other;Children(daughter, and 2 sons) Available Help at Discharge: Family;Available 24 hours/day Type of Home: House Home Access: Stairs to enter   [employer] of Steps: 4 Home Layout: One level Home Equipment: Cane - single point;Walker - 2 wheels Additional Comments: pt stood in shower    Prior Function Level of Independence: Independent with assistive device(s)         Comments: wife does the homemaking, doesn't drive     Hand Dominance        Extremity/Trunk  Assessment   Upper Extremity Assessment Upper Extremity Assessment: Overall WFL for tasks assessed    Lower Extremity Assessment Lower Extremity Assessment: RLE deficits/detail;LLE deficits/detail RLE Deficits / Details: at least 3+/5 RLE Coordination: decreased gross motor LLE Deficits / Details: at least 3+/5 LLE Coordination: decreased gross motor        Communication   Communication: No difficulties  Cognition Arousal/Alertness: Awake/alert Behavior During Therapy: Impulsive Overall Cognitive Status: No family/caregiver present to determine baseline cognitive functioning Area of Impairment: Following commands;Safety/judgement                       Following Commands: Follows multi-step commands inconsistently Safety/Judgement: Decreased awareness of safety;Decreased awareness of deficits     General Comments: mildly impulsive but repsonsive to cues      General Comments      Exercises     Assessment/Plan    PT Assessment Patient needs continued PT services  PT Problem List Decreased strength;Decreased activity tolerance;Decreased mobility;Decreased coordination;Decreased balance;Decreased safety awareness       PT Treatment Interventions DME instruction;Gait training;Functional mobility training;Therapeutic activities;Therapeutic exercise;Patient/family education;Balance training    PT Goals (Current goals can be found in the Care Plan section)  Acute Rehab PT Goals Patient Stated Goal: home PT Goal Formulation: With patient Time For Goal Achievement: 03/07/19 Potential to Achieve Goals: Good    Frequency Min 3X/week   Barriers to discharge        Co-evaluation               AM-PAC PT "6 Clicks" Mobility  Outcome Measure Help needed turning from your back to your side while in a flat bed without using bedrails?: A Little Help needed moving from lying on your back to sitting on the side of a flat bed without using bedrails?: A Little Help needed moving to and from a bed to a chair (including a wheelchair)?: A Little Help needed standing up from a chair using your arms (e.g., wheelchair or bedside chair)?: A Little Help needed to walk in hospital room?: A Little Help needed climbing 3-5 steps with a railing? : A Lot 6 Click Score: 17    End of Session Equipment Utilized During Treatment: Gait  belt Activity Tolerance: Patient tolerated treatment well Patient left: in chair;with call bell/phone within reach(RN nearby, declined need for chair alarm) Nurse Communication: Mobility status PT Visit Diagnosis: Difficulty in walking, not elsewhere classified (R26.2)    Time: 1115-1130 PT Time Calculation (min) (ACUTE ONLY): 15 min   Charges:   PT Evaluation $PT Eval Low Complexity: 1 Low          Kenyon Ana, PT  Pager: [phone] Acute Rehab Dept [facility]): [phone]   02/22/2019   [facility] 02/22/2019, 1:09 PM

## 2019-02-22 NOTE — H&P (Signed)
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 [104]  PT Acc Code (Do Not Modify): Observation [10022]       Obs  Level of care       medical floor    Precautions: No active isolations  PPE: Used by the provider:   P100  eye Goggles,  Gloves     Belynda Pagaduan 02/22/2019, 1:30 AM  [facility]     after 2 AM please page floor coverage PA If 7AM-7PM, please contact the day team taking care of the patient using [email or web address]

## 2019-02-22 NOTE — Plan of Care (Signed)

## 2019-02-22 NOTE — Care Management Obs Status (Signed)
[facility] NOTIFICATION   Patient Details  Name: Jermaine Mitchell MRN: [id number] Date of Birth: May 05, 1941   Medicare Observation Status Notification Given:  Yes    Joaquin Courts, RN 02/22/2019, 4:34 PM

## 2019-02-23 DIAGNOSIS — N390 Urinary tract infection, site not specified: Secondary | ICD-10-CM | POA: Diagnosis not present

## 2019-02-23 DIAGNOSIS — M25552 Pain in left hip: Secondary | ICD-10-CM | POA: Diagnosis not present

## 2019-02-23 DIAGNOSIS — K5900 Constipation, unspecified: Secondary | ICD-10-CM | POA: Diagnosis not present

## 2019-02-23 DIAGNOSIS — D649 Anemia, unspecified: Secondary | ICD-10-CM | POA: Diagnosis not present

## 2019-02-23 DIAGNOSIS — R531 Weakness: Secondary | ICD-10-CM | POA: Diagnosis not present

## 2019-02-23 DIAGNOSIS — E782 Mixed hyperlipidemia: Secondary | ICD-10-CM | POA: Diagnosis not present

## 2019-02-23 DIAGNOSIS — N451 Epididymitis: Secondary | ICD-10-CM | POA: Diagnosis not present

## 2019-02-23 DIAGNOSIS — I10 Essential (primary) hypertension: Secondary | ICD-10-CM | POA: Diagnosis not present

## 2019-02-23 LAB — GLUCOSE, CAPILLARY
Glucose-Capillary: 94 mg/dL (ref 70–99)
Glucose-Capillary: 157 mg/dL — ABNORMAL HIGH (ref 70–99)

## 2019-02-23 LAB — SARS CORONAVIRUS 2 (TAT 6-24 HRS): SARS Coronavirus 2: NEGATIVE

## 2019-02-23 MED ORDER — SENNA 8.6 MG PO TABS: 1.0000 | ORAL_TABLET | Freq: Two times a day (BID) | ORAL | 0 refills | Status: AC

## 2019-02-23 MED ORDER — SULFAMETHOXAZOLE-TRIMETHOPRIM 800-160 MG PO TABS: 1.0000 | ORAL_TABLET | Freq: Two times a day (BID) | ORAL | 0 refills | Status: AC

## 2019-02-23 MED ORDER — SULFAMETHOXAZOLE-TRIMETHOPRIM 800-160 MG PO TABS
1.0000 | ORAL_TABLET | Freq: Once | ORAL | Status: AC
  Administered 2019-02-23: 1 via ORAL
  Filled 2019-02-23: qty 1

## 2019-02-23 MED ORDER — POLYETHYLENE GLYCOL 3350 17 G PO PACK: 17.0000 g | PACK | Freq: Every day | ORAL | 0 refills | Status: AC | PRN

## 2019-02-23 NOTE — Progress Notes (Signed)
Pt was provided with d/c instructions. After discussing the pt's plan of care upon d/c home, the pt reported no further questions or concern.

## 2019-02-23 NOTE — Progress Notes (Signed)
PHARMACY - PHYSICIAN COMMUNICATION CRITICAL VALUE ALERT - BLOOD CULTURE IDENTIFICATION (BCID)  Jermaine Mitchell is an 78 y.o. male who presented to [facility] on 02/21/2019 with a chief complaint of L hip and abd pain  Assessment:  1 of 4 aerobic with gram positive rods Name of physician (or Provider) Contacte  Current antibiotics: S Nettey  Changes to prescribed antibiotics recommended:  rec ask ID> rec accepted.  Results for orders placed or performed during the hospital encounter of 02/21/19  Blood Culture ID Panel (Reflexed) (Collected: 02/21/2019  7:10 PM)  Result Value Ref Range   Enterococcus species NOT DETECTED NOT DETECTED   Listeria monocytogenes NOT DETECTED NOT DETECTED   Staphylococcus species DETECTED (A) NOT DETECTED   Staphylococcus aureus (BCID) NOT DETECTED NOT DETECTED   Methicillin resistance DETECTED (A) NOT DETECTED   Streptococcus species NOT DETECTED NOT DETECTED   Streptococcus agalactiae NOT DETECTED NOT DETECTED   Streptococcus pneumoniae NOT DETECTED NOT DETECTED   Streptococcus pyogenes NOT DETECTED NOT DETECTED   Acinetobacter baumannii NOT DETECTED NOT DETECTED   Enterobacteriaceae species NOT DETECTED NOT DETECTED   Enterobacter cloacae complex NOT DETECTED NOT DETECTED   Escherichia coli NOT DETECTED NOT DETECTED   Klebsiella oxytoca NOT DETECTED NOT DETECTED   Klebsiella pneumoniae NOT DETECTED NOT DETECTED   Proteus species NOT DETECTED NOT DETECTED   Serratia marcescens NOT DETECTED NOT DETECTED   Haemophilus influenzae NOT DETECTED NOT DETECTED   Neisseria meningitidis NOT DETECTED NOT DETECTED   Pseudomonas aeruginosa NOT DETECTED NOT DETECTED   Candida albicans NOT DETECTED NOT DETECTED   Candida glabrata NOT DETECTED NOT DETECTED   Candida krusei NOT DETECTED NOT DETECTED   Candida parapsilosis NOT DETECTED NOT DETECTED   Candida tropicalis NOT DETECTED NOT DETECTED    Eudelia Bunch, Pharm.D 02/23/2019 9:15 AM

## 2019-02-23 NOTE — Discharge Instructions (Signed)
Take 1 tab of bactrim twice daily for 8 days to treat your urinary tract infection/epididymitis  Follow up with your regular doctor in 1 week

## 2019-02-23 NOTE — Discharge Summary (Signed)
Jermaine Mitchell [id number] DOB: 23-Jun-1940 DOA: 02/21/2019  PCP: Mosie Lukes, MD  Admit date: 02/21/2019 Discharge date: 02/23/2019  Admitted From: Home Disposition:  Home  Recommendations for Outpatient Follow-up:  1. Follow up with PCP in 1-2 weeks 2. New medications: bactrim DS BID x 8 days   Home Health:PT and OT Equipment/Devices:none Discharge Condition:Stable  CODE STATUS:FULL Diet recommendation: Heart Healthy   Brief/Interim Summary: History of present illness:  Jermaine Mitchell is a 78 y.o. year old male with medical history significant for hypertension, hyperlipidemia, Dm2, elevated Psa, anemia ,chronic diastolic CHF,chronic kidney disease stage IV HLD prolonged QT, PAD who presented on 02/21/2019 with left hip pain after noticing a loud crack while sitting down and was found to have acute epididymitis, obstipation, transient hypotension.  ED course: Left hip x-ray negative for acute fracture.  Ultrasound for evaluation of left scrotal pain and edema consistent with epididymitis, started on IV ceftriaxone in ED.  UA consistent with UTI.  CT abdomen/pelvis for evaluation of abdominal discomfort showed colonic stool burden BP 98/50, with evidence of dehydration on exam patient required IV fluids and was admitted under observation.   Remaining hospital course addressed in problem based format below:   Hospital Course:    Acute left epididymitis/UTI.    Ultrasound with color Doppler was negative for testicular torsion and confirmed left epididymitis given age doubt STI, has 1 partner (wife).    Pain significantly improved on IV ceftriaxone, urine culture is pending on discharge.  Given age patient was transitioned to Bactrim 1 tablet double strength to continue to complete total 10-day course    Transient hypotension, resolved.  BP briefly 98/50 in ED, Likely due to dehydration in the setting of increased pain related to epididymitis and obstipation, also complicated  by continued use of home diuretics.,  During observation required IV fluids x24 hours.  Had no other signs or symptoms of sepsis physiology.  Was able to maintain normal BP with oral diet alone, and able to tolerate home BP regimen without further transient hypotension.     Abdominal pain, resolved.  Likely related to colonic burden.  Improved with BMs on  bowel regimen.  Other work-up was negative (CMP, lipase, CT abdomen unremarkable).  Eating well currently without any nausea/vomiting.  Provided PRN bowel regimen on discharge   Hip pain, resolved.  Likely referred pain from acute epididymitis.  No evidence of fracture on x-ray or CT imaging.  PT OT recommend home health therapy, arranged prior to discharge with case management involvement.   Blood culture contaminant.  Monitor for aerobic blood cultures growing G PR, briefly discussed with ID who agree this is likely contaminant.  104 blood cultures with MR-coag negative staph, suspect contaminant.  No changes in antibiotics given no implanted hardware or suspicion of untreated infection.  We will continue with Bactrim as mentioned above   Type 2 diabetes, A1c 10.  Insulin initially decreased given n.p.o./diminished oral intake.  Appetite  improved during observation stay, can can resume home insulin regimen on discharge.   Chronic CHF with preserved EF.  Does not look volume overloaded.    Briefly held home diuretics given hypotension on admission.  Can resume on discharge   Hypertension.  BP regimen was initially held due to SBP's in the 80s.  Since then has persistently been in the 140s to 160s.  Resumed home amlodipine during hospitalization and no recurrent hypotension continue Coreg   CAD, stable.  Asymptomatic.  Held home Imdur due to hypotension on  admission.  Can resume on discharge.   Hyperlipidemia, stable.  Continue home atorvastatin.   CKD, stage IV.  Baseline creatinine 3-3 .22.  2.83 on discharge.       Consultations:  none  Procedures/Studies: Scrotal color doppler ultrasound: 9/18 IMPRESSION: 1. No testicular torsion. 2. Left epididymitis. Subjective: Wants to go home Feels pain in left testes much better  Discharge Exam: Vitals:   02/23/19 0451 02/23/19 0841  BP: (!) 172/82 (!) 170/86  Pulse: 75 78  Resp: 19   Temp: 97.8 F (36.6 C)   SpO2: 100%    Vitals:   02/22/19 2106 02/23/19 0055 02/23/19 0451 02/23/19 0841  BP: 140/73 (!) 156/82 (!) 172/82 (!) 170/86  Pulse: 76 73 75 78  Resp: 16 19 19    Temp: 97.8 F (36.6 C) 97.8 F (36.6 C) 97.8 F (36.6 C)   TempSrc: Oral Oral Oral   SpO2: 98% 100% 100%   Weight:      Height:        General: Lying in bed, no apparent distress Eyes: EOMI, anicteric ENT: Oral Mucosa clear and moist Cardiovascular: regular rate and rhythm, no murmurs, rubs or gallops, no edema, Respiratory: Normal respiratory effort, lungs clear to auscultation bilaterally Abdomen: soft, non-distended, non-tender, normal bowel sounds Skin: No Rash Neurologic: Grossly no focal neuro deficit.Mental status AAOx3, speech normal, Psychiatric:Appropriate affect, and mood GU: left testes slightly raised and tender with palpation but much improved from prior exam, no erythema or drainage  Discharge Diagnoses:  Principal Problem:   Acute lower UTI Active Problems:   Hyperlipidemia, mixed   Anemia   Essential hypertension   Chronic diastolic CHF (congestive heart failure) (HCC)   Epididymitis   Left hip pain   Debility   Obstipation    Discharge Instructions  Discharge Instructions    Diet - low sodium heart healthy   Complete by: As directed    Increase activity slowly   Complete by: As directed      Allergies as of 02/23/2019      Reactions   Lisinopril Swelling, Other (See Comments)   Lip swelling   Codeine Other (See Comments)   "Makes me feel funny."      Medication List    TAKE these medications   Accu-Chek Softclix  Lancets lancets Use as directed once daily to check blood sugar. DX E11.9   amLODipine 10 MG tablet Commonly known as: NORVASC Take 1 tablet (10 mg total) by mouth daily.   aspirin 81 MG EC tablet Take 81 mg by mouth daily. Swallow whole.   atorvastatin 80 MG tablet Commonly known as: LIPITOR Take 1 tablet (80 mg total) by mouth daily with breakfast.   B-D SINGLE USE SWABS REGULAR Pads Use as directed daily to check blood sugar.  DX E11.9   carvedilol 25 MG tablet Commonly known as: COREG Take 1 tablet (25 mg total) by mouth 2 (two) times daily with a meal.   glucose blood test strip Commonly known as: Accu-Chek Aviva Plus 1 each by Other route 2 (two) times daily. And lancets 2/day   isosorbide mononitrate 30 MG 24 hr tablet Commonly known as: IMDUR Take 1 tablet (30 mg total) by mouth daily.   NovoLIN 70/30 ReliOn (70-30) 100 UNIT/ML injection Generic drug: insulin NPH-regular Human 36 units with breakfast, and 13 units with evening meal, and syringes 2/day What changed:   how much to take  when to take this  additional instructions   ondansetron 4 MG disintegrating  tablet Commonly known as: Zofran ODT Take 1 tablet (4 mg total) by mouth every 8 (eight) hours as needed.   polyethylene glycol 17 g packet Commonly known as: MIRALAX / GLYCOLAX Take 17 g by mouth daily as needed for mild constipation.   senna 8.6 MG Tabs tablet Commonly known as: SENOKOT Take 1 tablet (8.6 mg total) by mouth 2 (two) times daily.   sulfamethoxazole-trimethoprim 800-160 MG tablet Commonly known as: BACTRIM DS Take 1 tablet by mouth 2 (two) times daily for 8 days.   torsemide 20 MG tablet Commonly known as: DEMADEX Take 2 tablets (40 mg total) by mouth daily.       Allergies  Allergen Reactions  . Lisinopril Swelling and Other (See Comments)    Lip swelling   . Codeine Other (See Comments)    "Makes me feel funny."        The results of significant diagnostics  from this hospitalization (including imaging, microbiology, ancillary and laboratory) are listed below for reference.     Microbiology: Recent Results (from the past 240 hour(s))  Urine culture     Status: Abnormal (Preliminary result)   Collection Time: 02/21/19  5:40 PM   Specimen: Urine, Random  Result Value Ref Range Status   Specimen Description   Final    URINE, RANDOM Performed at [facility], [address] [place], [place] [address]    Special Requests   Final    NONE Performed at [facility], [address] [place], [place] [address]    Culture >=100,000 COLONIES/mL GRAM NEGATIVE RODS (A)  Final   Report Status PENDING  Incomplete  Blood Culture (routine x 2)     Status: None (Preliminary result)   Collection Time: 02/21/19  7:04 PM   Specimen: BLOOD LEFT WRIST  Result Value Ref Range Status   Specimen Description   Final    BLOOD LEFT WRIST Performed at [facility], [address] [place], [place] [address]    Special Requests   Final    BOTTLES DRAWN AEROBIC AND ANAEROBIC Blood Culture adequate volume Performed at [facility], [address] [place], [place] [address]    Culture  Setup Time   Final    GRAM POSITIVE RODS AEROBIC BOTTLE ONLY CRITICAL RESULT CALLED TO, READ BACK BY AND VERIFIED WITH: L. Wynona Canes PHARMD, AT [id number] 02/23/19 BY Rush Landmark Performed at [facility], [place], [place], [place] [address]    Culture GRAM POSITIVE RODS  Final   Report Status PENDING  Incomplete  Blood Culture (routine x 2)     Status: None (Preliminary result)   Collection Time: 02/21/19  7:10 PM   Specimen: BLOOD LEFT ARM  Result Value Ref Range Status   Specimen Description   Final    BLOOD LEFT ARM Performed at [facility], [address] [place], [place] [address]    Special Requests   Final    BOTTLES DRAWN AEROBIC ONLY Blood Culture adequate volume Performed at [facility],  [address] [place], [place] [address]    Culture  Setup Time   Final    GRAM POSITIVE COCCI IN CLUSTERS AEROBIC BOTTLE ONLY Organism ID to follow CRITICAL RESULT CALLED TO, READ BACK BY AND VERIFIED WITH: Kelby Aline [facility] 1806 [id number] FCP Performed at [facility], [place] [address], [place], [place] [address]  Culture GRAM POSITIVE COCCI  Final   Report Status PENDING  Incomplete  Blood Culture ID Panel (Reflexed)     Status: Abnormal   Collection Time: 02/21/19  7:10 PM  Result Value Ref Range Status   Enterococcus species NOT DETECTED NOT DETECTED Final   Listeria monocytogenes NOT DETECTED NOT DETECTED Final   Staphylococcus species DETECTED (A) NOT DETECTED Final    Comment: Methicillin (oxacillin) resistant coagulase negative staphylococcus. Possible blood culture contaminant (unless isolated from more than one blood culture draw or clinical case suggests pathogenicity). No antibiotic treatment is indicated for blood  culture contaminants. CRITICAL RESULT CALLED TO, READ BACK BY AND VERIFIED WITH: PHARMD DREW [facility] 1806 [id number] FCP    Staphylococcus aureus (BCID) NOT DETECTED NOT DETECTED Final   Methicillin resistance DETECTED (A) NOT DETECTED Final    Comment: CRITICAL RESULT CALLED TO, READ BACK BY AND VERIFIED WITH: PHARMD DREW [facility] 1806 [id number] FCP    Streptococcus species NOT DETECTED NOT DETECTED Final   Streptococcus agalactiae NOT DETECTED NOT DETECTED Final   Streptococcus pneumoniae NOT DETECTED NOT DETECTED Final   Streptococcus pyogenes NOT DETECTED NOT DETECTED Final   Acinetobacter baumannii NOT DETECTED NOT DETECTED Final   Enterobacteriaceae species NOT DETECTED NOT DETECTED Final   Enterobacter cloacae complex NOT DETECTED NOT DETECTED Final   Escherichia coli NOT DETECTED NOT DETECTED Final   Klebsiella oxytoca NOT DETECTED NOT DETECTED Final   Klebsiella pneumoniae NOT DETECTED NOT DETECTED Final   Proteus species NOT DETECTED NOT  DETECTED Final   Serratia marcescens NOT DETECTED NOT DETECTED Final   Haemophilus influenzae NOT DETECTED NOT DETECTED Final   Neisseria meningitidis NOT DETECTED NOT DETECTED Final   Pseudomonas aeruginosa NOT DETECTED NOT DETECTED Final   Candida albicans NOT DETECTED NOT DETECTED Final   Candida glabrata NOT DETECTED NOT DETECTED Final   Candida krusei NOT DETECTED NOT DETECTED Final   Candida parapsilosis NOT DETECTED NOT DETECTED Final   Candida tropicalis NOT DETECTED NOT DETECTED Final    Comment: Performed at [facility], [address] [place], [place] [address]  SARS CORONAVIRUS 2 (TAT 6-24 HRS) Nasopharyngeal Nasopharyngeal Swab     Status: None   Collection Time: 02/21/19 10:40 PM   Specimen: Nasopharyngeal Swab  Result Value Ref Range Status   SARS Coronavirus 2 NEGATIVE NEGATIVE Final    Comment: (NOTE) SARS-CoV-2 target nucleic acids are NOT DETECTED. The SARS-CoV-2 RNA is generally detectable in upper and lower respiratory specimens during the acute phase of infection. Negative results do not preclude SARS-CoV-2 infection, do not rule out co-infections with other pathogens, and should not be used as the sole basis for treatment or other patient management decisions. Negative results must be combined with clinical observations, patient history, and epidemiological information. The expected result is Negative. Fact Sheet for Patients: SugarRoll.be Fact Sheet for Healthcare Providers: https://www.woods-mathews.com/ This test is not yet approved or cleared by the [place] FDA and  has been authorized for detection and/or diagnosis of SARS-CoV-2 by FDA under an Emergency Use Authorization (EUA). This EUA will remain  in effect (meaning this test can be used) for the duration of the COVID-19 declaration under Section 56 4(b)(1) of the Act, 21 U.S.C. section 360bbb-3(b)(1), unless the authorization is terminated  or revoked sooner. Performed at [facility], [place] [address], [place], [place] [address]      Labs: BNP (last 3 results) Recent Labs    11/30/18 0915  BNP 1,206.7*   Basic  Metabolic Panel: Recent Labs  Lab 02/21/19 1438 02/22/19 0233  NA 134* 138  K 3.9 3.9  CL 103 109  CO2 21* 19*  GLUCOSE 222* 264*  BUN 62* 60*  CREATININE 3.34* 2.83*  CALCIUM 8.7* 8.6*  MG  --  2.3  PHOS  --  3.0   Liver Function Tests: Recent Labs  Lab 02/21/19 1438 02/22/19 0233  AST 19 20  ALT 12 15  ALKPHOS 75 76  BILITOT 0.6 0.4  PROT 7.5 7.4  ALBUMIN 2.7* 2.8*   No results for input(s): LIPASE, AMYLASE in the last 168 hours. No results for input(s): AMMONIA in the last 168 hours. CBC: Recent Labs  Lab 02/21/19 1438 02/22/19 0233  WBC 15.6* 13.8*  NEUTROABS 13.1*  --   HGB 10.3* 10.7*  HCT 31.6* 32.9*  MCV 83.2 84.1  PLT 294 264   Cardiac Enzymes: No results for input(s): CKTOTAL, CKMB, CKMBINDEX, TROPONINI in the last 168 hours. BNP: Invalid input(s): POCBNP CBG: Recent Labs  Lab 02/22/19 1631 02/22/19 2021 02/22/19 2345 02/23/19 0406 02/23/19 0711  GLUCAP 110* 155* 90 94 157*   D-Dimer No results for input(s): DDIMER in the last 72 hours. Hgb A1c Recent Labs    02/22/19 0233  HGBA1C 10.0*   Lipid Profile No results for input(s): CHOL, HDL, LDLCALC, TRIG, CHOLHDL, LDLDIRECT in the last 72 hours. Thyroid function studies Recent Labs    02/22/19 0233  TSH 0.652   Anemia work up No results for input(s): VITAMINB12, FOLATE, FERRITIN, TIBC, IRON, RETICCTPCT in the last 72 hours. Urinalysis    Component Value Date/Time   COLORURINE YELLOW 02/21/2019 1740   APPEARANCEUR CLOUDY (A) 02/21/2019 1740   LABSPEC 1.020 02/21/2019 1740   PHURINE 5.5 02/21/2019 1740   GLUCOSEU 100 (A) 02/21/2019 1740   GLUCOSEU 250 (A) 08/10/2016 1145   HGBUR SMALL (A) 02/21/2019 1740   BILIRUBINUR NEGATIVE 02/21/2019 1740   KETONESUR NEGATIVE 02/21/2019 1740    PROTEINUR 30 (A) 02/21/2019 1740   UROBILINOGEN 0.2 08/10/2016 1145   NITRITE NEGATIVE 02/21/2019 1740   LEUKOCYTESUR MODERATE (A) 02/21/2019 1740   Sepsis Labs Invalid input(s): PROCALCITONIN,  WBC,  LACTICIDVEN Microbiology Recent Results (from the past 240 hour(s))  Urine culture     Status: Abnormal (Preliminary result)   Collection Time: 02/21/19  5:40 PM   Specimen: Urine, Random  Result Value Ref Range Status   Specimen Description   Final    URINE, RANDOM Performed at [facility], [address] [place], [place] [address]    Special Requests   Final    NONE Performed at [facility], [address] [place], [place] [address]    Culture >=100,000 COLONIES/mL GRAM NEGATIVE RODS (A)  Final   Report Status PENDING  Incomplete  Blood Culture (routine x 2)     Status: None (Preliminary result)   Collection Time: 02/21/19  7:04 PM   Specimen: BLOOD LEFT WRIST  Result Value Ref Range Status   Specimen Description   Final    BLOOD LEFT WRIST Performed at [facility], [address] [place], [place] [address]    Special Requests   Final    BOTTLES DRAWN AEROBIC AND ANAEROBIC Blood Culture adequate volume Performed at [facility], [address] [place], [place] [address]    Culture  Setup Time   Final    GRAM POSITIVE RODS AEROBIC BOTTLE ONLY CRITICAL RESULT CALLED TO, READ  BACK BY AND VERIFIED WITH: L. Wynona Canes PHARMD, AT [id number] 02/23/19 BY Rush Landmark Performed at [facility], [place] [address], [place], [place] [address]    Culture GRAM POSITIVE RODS  Final   Report Status PENDING  Incomplete  Blood Culture (routine x 2)     Status: None (Preliminary result)   Collection Time: 02/21/19  7:10 PM   Specimen: BLOOD LEFT ARM  Result Value Ref Range Status   Specimen Description   Final    BLOOD LEFT ARM Performed at [facility], [address] [place], [place] [address]    Special Requests   Final     BOTTLES DRAWN AEROBIC ONLY Blood Culture adequate volume Performed at [facility], [address] [place], [place] [address]    Culture  Setup Time   Final    GRAM POSITIVE COCCI IN CLUSTERS AEROBIC BOTTLE ONLY Organism ID to follow CRITICAL RESULT CALLED TO, READ BACK BY AND VERIFIED WITH: Kelby Aline [facility] 1806 [id number] FCP Performed at [facility], [place] [address] [place], [place] [address]    Culture GRAM POSITIVE COCCI  Final   Report Status PENDING  Incomplete  Blood Culture ID Panel (Reflexed)     Status: Abnormal   Collection Time: 02/21/19  7:10 PM  Result Value Ref Range Status   Enterococcus species NOT DETECTED NOT DETECTED Final   Listeria monocytogenes NOT DETECTED NOT DETECTED Final   Staphylococcus species DETECTED (A) NOT DETECTED Final    Comment: Methicillin (oxacillin) resistant coagulase negative staphylococcus. Possible blood culture contaminant (unless isolated from more than one blood culture draw or clinical case suggests pathogenicity). No antibiotic treatment is indicated for blood  culture contaminants. CRITICAL RESULT CALLED TO, READ BACK BY AND VERIFIED WITH: PHARMD DREW [facility] 1806 [id number] FCP    Staphylococcus aureus (BCID) NOT DETECTED NOT DETECTED Final   Methicillin resistance DETECTED (A) NOT DETECTED Final    Comment: CRITICAL RESULT CALLED TO, READ BACK BY AND VERIFIED WITH: PHARMD DREW [facility] 1806 [id number] FCP    Streptococcus species NOT DETECTED NOT DETECTED Final   Streptococcus agalactiae NOT DETECTED NOT DETECTED Final   Streptococcus pneumoniae NOT DETECTED NOT DETECTED Final   Streptococcus pyogenes NOT DETECTED NOT DETECTED Final   Acinetobacter baumannii NOT DETECTED NOT DETECTED Final   Enterobacteriaceae species NOT DETECTED NOT DETECTED Final   Enterobacter cloacae complex NOT DETECTED NOT DETECTED Final   Escherichia coli NOT DETECTED NOT DETECTED Final   Klebsiella oxytoca NOT DETECTED NOT DETECTED Final    Klebsiella pneumoniae NOT DETECTED NOT DETECTED Final   Proteus species NOT DETECTED NOT DETECTED Final   Serratia marcescens NOT DETECTED NOT DETECTED Final   Haemophilus influenzae NOT DETECTED NOT DETECTED Final   Neisseria meningitidis NOT DETECTED NOT DETECTED Final   Pseudomonas aeruginosa NOT DETECTED NOT DETECTED Final   Candida albicans NOT DETECTED NOT DETECTED Final   Candida glabrata NOT DETECTED NOT DETECTED Final   Candida krusei NOT DETECTED NOT DETECTED Final   Candida parapsilosis NOT DETECTED NOT DETECTED Final   Candida tropicalis NOT DETECTED NOT DETECTED Final    Comment: Performed at [facility], [address], [place], [place] [address]  SARS CORONAVIRUS 2 (TAT 6-24 HRS) Nasopharyngeal Nasopharyngeal Swab     Status: None   Collection Time: 02/21/19 10:40 PM   Specimen: Nasopharyngeal Swab  Result Value Ref Range Status   SARS Coronavirus 2 NEGATIVE NEGATIVE Final  Comment: (NOTE) SARS-CoV-2 target nucleic acids are NOT DETECTED. The SARS-CoV-2 RNA is generally detectable in upper and lower respiratory specimens during the acute phase of infection. Negative results do not preclude SARS-CoV-2 infection, do not rule out co-infections with other pathogens, and should not be used as the sole basis for treatment or other patient management decisions. Negative results must be combined with clinical observations, patient history, and epidemiological information. The expected result is Negative. Fact Sheet for Patients: SugarRoll.be Fact Sheet for Healthcare Providers: https://www.woods-mathews.com/ This test is not yet approved or cleared by the [place] FDA and  has been authorized for detection and/or diagnosis of SARS-CoV-2 by FDA under an Emergency Use Authorization (EUA). This EUA will remain  in effect (meaning this test can be used) for the duration of the COVID-19 declaration under Section 56 4(b)(1) of  the Act, 21 U.S.C. section 360bbb-3(b)(1), unless the authorization is terminated or revoked sooner. Performed at [facility], [place] [address], [place], [place] [address]      Time coordinating discharge: Over 30 minutes  SIGNED:   [name], MD  [facility] 02/23/2019, 10:06 AM Pager   If 7PM-7AM, please contact night-coverage [email or web address] Password [email or web address]

## 2019-02-23 NOTE — TOC Initial Note (Signed)
Transition of Care [facility]) - Initial/Assessment Note    Patient Details  Name: Jermaine Mitchell MRN: [id number] Date of Birth: [birth year]  Transition [facility]) CM/SW Contact:    Joaquin Courts, RN Phone Number: 02/23/2019, 10:46 AM  Clinical Narrative:    CM spoke with patient who referred CM to spouse for final decision regarding [place]. Spouse states patient did not participate with PT/OT  In the past and did not benefit from the service. PT/Spouse are declining HH services.               Expected Discharge Plan: Home/Self Care Barriers to Discharge: No Barriers Identified   Patient Goals and CMS Choice Patient states their goals for this hospitalization and ongoing recovery are:: to go home      Expected Discharge Plan and Services Expected Discharge Plan: Home/Self Care   Discharge Planning Services: CM Consult   Living arrangements for the past 2 months: Single Family Home Expected Discharge Date: 02/23/19               DME Arranged: N/A DME Agency: NA       HH Arranged: Refused HH          Prior Living Arrangements/Services Living arrangements for the past 2 months: Single Family Home Lives with:: Spouse Patient language and need for interpreter reviewed:: Yes Do you feel safe going back to the place where you live?: Yes      Need for Family Participation in Patient Care: Yes (Comment) Care giver support system in place?: Yes (comment)   Criminal Activity/Legal Involvement Pertinent to Current Situation/Hospitalization: No - Comment as needed  Activities of Daily Living Home Assistive Devices/Equipment: Cane (specify quad or straight), Walker (specify type) ADL Screening (condition at time of admission) Patient's cognitive ability adequate to safely complete daily activities?: Yes Is the patient deaf or have difficulty hearing?: No Does the patient have difficulty seeing, even when wearing glasses/contacts?: No Does the patient have difficulty  concentrating, remembering, or making decisions?: No Patient able to express need for assistance with ADLs?: No Does the patient have difficulty dressing or bathing?: No Independently performs ADLs?: Yes (appropriate for developmental age) Does the patient have difficulty walking or climbing stairs?: No Weakness of Legs: None Weakness of Arms/Hands: None  Permission Sought/Granted                  Emotional Assessment Appearance:: Appears stated age Attitude/Demeanor/Rapport: Engaged   Orientation: : Oriented to Place, Oriented to  Time, Oriented to Situation, Oriented to Self   Psych Involvement: No (comment)  Admission diagnosis:  Epididymitis [N45.1] Weakness [R53.1] Left hip pain [M25.552] Acute UTI [N39.0] Patient Active Problem List   Diagnosis Date Noted  . Epididymitis 02/22/2019  . Left hip pain 02/22/2019  . Debility 02/22/2019  . Obstipation 02/22/2019  . Acute lower UTI 02/21/2019  . Nausea & vomiting 01/16/2019  . CHF exacerbation ([place]) 12/01/2018  . Chronic diastolic CHF (congestive heart failure) ([place]) 11/30/2018  . Elevated troponin 11/30/2018  . Prolonged QT interval 11/30/2018  . Acute renal failure superimposed on chronic kidney disease ([place]) 11/30/2018  . Hypocalcemia 04/07/2018  . Leukocytosis 04/07/2018  . Vitamin D deficiency 04/07/2018  . Elevated PSA 08/13/2016  . Low back pain 08/10/2016  . Urinary hesitancy 08/26/2015  . Onychomycosis 04/04/2015  . Diabetes ([place]) 04/01/2015  . Preventative health care 08/24/2013  . Renal insufficiency 07/25/2012  . Allergic rhinitis 08/25/2008  . 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 NUMBNESS 06/18/2008  .  Anemia 06/17/2008  . PROTEINURIA 06/17/2008  . 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 Diabetic peripheral vascular disease ([place]) 06/15/2008  . Hyperlipidemia, mixed 06/15/2008  . Essential hypertension 06/15/2008   PCP:  Mosie Lukes, MD Pharmacy:   [facility] [address]  [place]), [place] - [id number] [place] [address] Phone: [phone] Fax: [phone]     Social Determinants of Health (SDOH) Interventions    Readmission Risk Interventions No flowsheet data found.

## 2019-02-24 ENCOUNTER — Telehealth: Payer: Self-pay | Admitting: *Deleted

## 2019-02-24 LAB — URINE CULTURE: Culture: >=100000 — AB

## 2019-02-24 NOTE — Telephone Encounter (Signed)
Patient currently in the hospital 

## 2019-02-24 NOTE — Telephone Encounter (Signed)
He is home now and treated for UTI, please see his result note to see how he is feeling, check repeat labs and see if he is improving this week. He will also need a hospital follow up and we may need to alter his antibiotic if he is not improving.

## 2019-02-24 NOTE — Telephone Encounter (Signed)
Transition Care Management Follow-up Telephone Call  Date discharged? 02/23/19   How have you been since you were released from the hospital? "responding well to medicine"   Do you understand why you were in the hospital? yes   Do you understand the discharge instructions? yes   Where were you discharged to? Home with wife.   Items Reviewed:  Medications reviewed: "they just added abx and laxative"  Allergies reviewed: "no new allergies"  Dietary changes reviewed: yes  Referrals reviewed: yes   Functional Questionnaire:   Activities of Daily Living (ADLs):   He states they are independent in the following: ambulation, bathing and hygiene, feeding, continence, grooming, toileting and dressing. Uses walker. States they require assistance with the following: na   Any transportation issues/concerns?: no   Any patient concerns? no   Pt wife going to call back to reschedule.  Confirmed with patient if condition begins to worsen call PCP or go to the ER.  Patient was given the office number and encouraged to call back with question or concerns.  : yes

## 2019-02-25 LAB — CULTURE, BLOOD (ROUTINE X 2)
Special Requests: ADEQUATE
Special Requests: ADEQUATE

## 2019-02-26 NOTE — Telephone Encounter (Signed)
Called patient left voicemail for patient to call the office back. Patient will need a hospital follow up with pcp Nurse triage may handle

## 2019-02-28 ENCOUNTER — Ambulatory Visit (INDEPENDENT_AMBULATORY_CARE_PROVIDER_SITE_OTHER): Payer: Medicare HMO | Admitting: Family Medicine

## 2019-02-28 ENCOUNTER — Other Ambulatory Visit: Payer: Self-pay

## 2019-02-28 DIAGNOSIS — N184 Chronic kidney disease, stage 4 (severe): Secondary | ICD-10-CM | POA: Diagnosis not present

## 2019-02-28 DIAGNOSIS — E559 Vitamin D deficiency, unspecified: Secondary | ICD-10-CM

## 2019-02-28 DIAGNOSIS — N390 Urinary tract infection, site not specified: Secondary | ICD-10-CM

## 2019-02-28 DIAGNOSIS — E782 Mixed hyperlipidemia: Secondary | ICD-10-CM

## 2019-02-28 DIAGNOSIS — R300 Dysuria: Secondary | ICD-10-CM

## 2019-02-28 DIAGNOSIS — Z794 Long term (current) use of insulin: Secondary | ICD-10-CM | POA: Diagnosis not present

## 2019-02-28 DIAGNOSIS — E1122 Type 2 diabetes mellitus with diabetic chronic kidney disease: Secondary | ICD-10-CM

## 2019-02-28 DIAGNOSIS — I10 Essential (primary) hypertension: Secondary | ICD-10-CM | POA: Diagnosis not present

## 2019-03-02 NOTE — Assessment & Plan Note (Signed)
Is improving since returning home from the hospital but his wife confirms he has only been taking one dose of antibiotic daily as opposed to the bid dosing he was prescribed. He agrees to increase to bid and we will recheck ua and culture next week

## 2019-03-02 NOTE — Assessment & Plan Note (Signed)
With hypocalcemia on recent labs. Repeat cmp next week and supplement

## 2019-03-02 NOTE — Assessment & Plan Note (Signed)
Monitor vitals at home and report any concerns. no changes to meds. Encouraged heart healthy diet such as the DASH diet and exercise as tolerated.
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
## 2019-03-02 NOTE — Assessment & Plan Note (Signed)
hgba1c unacceptable, minimize simple carbs. Increase exercise as tolerated. Continue current meds. Encouraged to monitor sugars at home and will review at next visit in 1 month or as needed

## 2019-03-31 ENCOUNTER — Encounter: Payer: Self-pay | Admitting: Cardiovascular Disease

## 2019-03-31 NOTE — Progress Notes (Deleted)
No show. Error

## 2019-04-01 ENCOUNTER — Ambulatory Visit: Payer: Medicare HMO | Admitting: Cardiovascular Disease

## 2019-04-06 DIAGNOSIS — I639 Cerebral infarction, unspecified: Secondary | ICD-10-CM

## 2019-04-06 HISTORY — DX: Cerebral infarction, unspecified: I63.9

## 2019-04-09 ENCOUNTER — Inpatient Hospital Stay (HOSPITAL_COMMUNITY)
Admission: EM | Admit: 2019-04-09 | Discharge: 2019-05-06 | DRG: 037 | Disposition: E | Payer: Medicare HMO | Attending: Pulmonary Disease | Admitting: Pulmonary Disease

## 2019-04-09 ENCOUNTER — Encounter (HOSPITAL_COMMUNITY): Payer: Self-pay | Admitting: Emergency Medicine

## 2019-04-09 ENCOUNTER — Emergency Department (HOSPITAL_COMMUNITY): Payer: Medicare HMO

## 2019-04-09 ENCOUNTER — Other Ambulatory Visit: Payer: Self-pay

## 2019-04-09 DIAGNOSIS — D6489 Other specified anemias: Secondary | ICD-10-CM | POA: Diagnosis present

## 2019-04-09 DIAGNOSIS — R29721 NIHSS score 21: Secondary | ICD-10-CM | POA: Diagnosis present

## 2019-04-09 DIAGNOSIS — T17998A Other foreign object in respiratory tract, part unspecified causing other injury, initial encounter: Secondary | ICD-10-CM

## 2019-04-09 DIAGNOSIS — I639 Cerebral infarction, unspecified: Secondary | ICD-10-CM | POA: Diagnosis not present

## 2019-04-09 DIAGNOSIS — Z978 Presence of other specified devices: Secondary | ICD-10-CM | POA: Diagnosis not present

## 2019-04-09 DIAGNOSIS — N401 Enlarged prostate with lower urinary tract symptoms: Secondary | ICD-10-CM | POA: Diagnosis not present

## 2019-04-09 DIAGNOSIS — Z820 Family history of epilepsy and other diseases of the nervous system: Secondary | ICD-10-CM

## 2019-04-09 DIAGNOSIS — N368 Other specified disorders of urethra: Secondary | ICD-10-CM | POA: Diagnosis not present

## 2019-04-09 DIAGNOSIS — D649 Anemia, unspecified: Secondary | ICD-10-CM | POA: Diagnosis not present

## 2019-04-09 DIAGNOSIS — L7632 Postprocedural hematoma of skin and subcutaneous tissue following other procedure: Secondary | ICD-10-CM | POA: Diagnosis not present

## 2019-04-09 DIAGNOSIS — J690 Pneumonitis due to inhalation of food and vomit: Secondary | ICD-10-CM | POA: Diagnosis not present

## 2019-04-09 DIAGNOSIS — I9581 Postprocedural hypotension: Secondary | ICD-10-CM | POA: Diagnosis not present

## 2019-04-09 DIAGNOSIS — Z7982 Long term (current) use of aspirin: Secondary | ICD-10-CM

## 2019-04-09 DIAGNOSIS — E1140 Type 2 diabetes mellitus with diabetic neuropathy, unspecified: Secondary | ICD-10-CM | POA: Diagnosis not present

## 2019-04-09 DIAGNOSIS — I63219 Cerebral infarction due to unspecified occlusion or stenosis of unspecified vertebral arteries: Secondary | ICD-10-CM | POA: Diagnosis not present

## 2019-04-09 DIAGNOSIS — R339 Retention of urine, unspecified: Secondary | ICD-10-CM

## 2019-04-09 DIAGNOSIS — R0902 Hypoxemia: Secondary | ICD-10-CM | POA: Diagnosis not present

## 2019-04-09 DIAGNOSIS — E782 Mixed hyperlipidemia: Secondary | ICD-10-CM | POA: Diagnosis not present

## 2019-04-09 DIAGNOSIS — Y838 Other surgical procedures as the cause of abnormal reaction of the patient, or of later complication, without mention of misadventure at the time of the procedure: Secondary | ICD-10-CM | POA: Diagnosis not present

## 2019-04-09 DIAGNOSIS — R680 Hypothermia, not associated with low environmental temperature: Secondary | ICD-10-CM | POA: Diagnosis not present

## 2019-04-09 DIAGNOSIS — E1122 Type 2 diabetes mellitus with diabetic chronic kidney disease: Secondary | ICD-10-CM | POA: Diagnosis present

## 2019-04-09 DIAGNOSIS — Z66 Do not resuscitate: Secondary | ICD-10-CM | POA: Diagnosis not present

## 2019-04-09 DIAGNOSIS — G8250 Quadriplegia, unspecified: Secondary | ICD-10-CM | POA: Diagnosis present

## 2019-04-09 DIAGNOSIS — Z515 Encounter for palliative care: Secondary | ICD-10-CM | POA: Diagnosis not present

## 2019-04-09 DIAGNOSIS — E1165 Type 2 diabetes mellitus with hyperglycemia: Secondary | ICD-10-CM | POA: Diagnosis present

## 2019-04-09 DIAGNOSIS — I130 Hypertensive heart and chronic kidney disease with heart failure and stage 1 through stage 4 chronic kidney disease, or unspecified chronic kidney disease: Secondary | ICD-10-CM | POA: Diagnosis present

## 2019-04-09 DIAGNOSIS — I63211 Cerebral infarction due to unspecified occlusion or stenosis of right vertebral arteries: Secondary | ICD-10-CM | POA: Diagnosis present

## 2019-04-09 DIAGNOSIS — E1159 Type 2 diabetes mellitus with other circulatory complications: Secondary | ICD-10-CM | POA: Diagnosis not present

## 2019-04-09 DIAGNOSIS — G9341 Metabolic encephalopathy: Secondary | ICD-10-CM | POA: Diagnosis not present

## 2019-04-09 DIAGNOSIS — I5032 Chronic diastolic (congestive) heart failure: Secondary | ICD-10-CM | POA: Diagnosis present

## 2019-04-09 DIAGNOSIS — J9690 Respiratory failure, unspecified, unspecified whether with hypoxia or hypercapnia: Secondary | ICD-10-CM | POA: Diagnosis not present

## 2019-04-09 DIAGNOSIS — T68XXXA Hypothermia, initial encounter: Secondary | ICD-10-CM | POA: Diagnosis not present

## 2019-04-09 DIAGNOSIS — N184 Chronic kidney disease, stage 4 (severe): Secondary | ICD-10-CM | POA: Diagnosis not present

## 2019-04-09 DIAGNOSIS — I672 Cerebral atherosclerosis: Secondary | ICD-10-CM | POA: Diagnosis present

## 2019-04-09 DIAGNOSIS — R042 Hemoptysis: Secondary | ICD-10-CM | POA: Diagnosis not present

## 2019-04-09 DIAGNOSIS — R531 Weakness: Secondary | ICD-10-CM | POA: Diagnosis not present

## 2019-04-09 DIAGNOSIS — Z794 Long term (current) use of insulin: Secondary | ICD-10-CM | POA: Diagnosis not present

## 2019-04-09 DIAGNOSIS — N179 Acute kidney failure, unspecified: Secondary | ICD-10-CM | POA: Diagnosis not present

## 2019-04-09 DIAGNOSIS — Z823 Family history of stroke: Secondary | ICD-10-CM

## 2019-04-09 DIAGNOSIS — Z885 Allergy status to narcotic agent status: Secondary | ICD-10-CM

## 2019-04-09 DIAGNOSIS — Z87891 Personal history of nicotine dependence: Secondary | ICD-10-CM

## 2019-04-09 DIAGNOSIS — Z8249 Family history of ischemic heart disease and other diseases of the circulatory system: Secondary | ICD-10-CM

## 2019-04-09 DIAGNOSIS — R338 Other retention of urine: Secondary | ICD-10-CM | POA: Diagnosis not present

## 2019-04-09 DIAGNOSIS — I6340 Cerebral infarction due to embolism of unspecified cerebral artery: Secondary | ICD-10-CM | POA: Diagnosis not present

## 2019-04-09 DIAGNOSIS — Z888 Allergy status to other drugs, medicaments and biological substances status: Secondary | ICD-10-CM

## 2019-04-09 DIAGNOSIS — Z20828 Contact with and (suspected) exposure to other viral communicable diseases: Secondary | ICD-10-CM | POA: Diagnosis present

## 2019-04-09 DIAGNOSIS — I6501 Occlusion and stenosis of right vertebral artery: Secondary | ICD-10-CM | POA: Diagnosis not present

## 2019-04-09 DIAGNOSIS — R9431 Abnormal electrocardiogram [ECG] [EKG]: Secondary | ICD-10-CM | POA: Diagnosis present

## 2019-04-09 DIAGNOSIS — Z9842 Cataract extraction status, left eye: Secondary | ICD-10-CM

## 2019-04-09 DIAGNOSIS — R4701 Aphasia: Secondary | ICD-10-CM | POA: Diagnosis present

## 2019-04-09 DIAGNOSIS — Z803 Family history of malignant neoplasm of breast: Secondary | ICD-10-CM

## 2019-04-09 DIAGNOSIS — N189 Chronic kidney disease, unspecified: Secondary | ICD-10-CM | POA: Diagnosis not present

## 2019-04-09 DIAGNOSIS — J9601 Acute respiratory failure with hypoxia: Secondary | ICD-10-CM | POA: Diagnosis not present

## 2019-04-09 DIAGNOSIS — Z807 Family history of other malignant neoplasms of lymphoid, hematopoietic and related tissues: Secondary | ICD-10-CM

## 2019-04-09 DIAGNOSIS — E1151 Type 2 diabetes mellitus with diabetic peripheral angiopathy without gangrene: Secondary | ICD-10-CM | POA: Diagnosis not present

## 2019-04-09 DIAGNOSIS — E119 Type 2 diabetes mellitus without complications: Secondary | ICD-10-CM

## 2019-04-09 DIAGNOSIS — I679 Cerebrovascular disease, unspecified: Secondary | ICD-10-CM | POA: Diagnosis not present

## 2019-04-09 DIAGNOSIS — R4781 Slurred speech: Secondary | ICD-10-CM | POA: Diagnosis not present

## 2019-04-09 DIAGNOSIS — Z9841 Cataract extraction status, right eye: Secondary | ICD-10-CM

## 2019-04-09 DIAGNOSIS — I10 Essential (primary) hypertension: Secondary | ICD-10-CM | POA: Diagnosis not present

## 2019-04-09 DIAGNOSIS — N183 Chronic kidney disease, stage 3 unspecified: Secondary | ICD-10-CM | POA: Diagnosis present

## 2019-04-09 DIAGNOSIS — N471 Phimosis: Secondary | ICD-10-CM | POA: Diagnosis not present

## 2019-04-09 DIAGNOSIS — R471 Dysarthria and anarthria: Secondary | ICD-10-CM | POA: Diagnosis not present

## 2019-04-09 DIAGNOSIS — Z79899 Other long term (current) drug therapy: Secondary | ICD-10-CM

## 2019-04-09 DIAGNOSIS — I6389 Other cerebral infarction: Secondary | ICD-10-CM | POA: Diagnosis not present

## 2019-04-09 DIAGNOSIS — Z800 Family history of malignant neoplasm of digestive organs: Secondary | ICD-10-CM

## 2019-04-09 DIAGNOSIS — D62 Acute posthemorrhagic anemia: Secondary | ICD-10-CM | POA: Diagnosis not present

## 2019-04-09 DIAGNOSIS — Z539 Procedure and treatment not carried out, unspecified reason: Secondary | ICD-10-CM | POA: Diagnosis not present

## 2019-04-09 DIAGNOSIS — Z833 Family history of diabetes mellitus: Secondary | ICD-10-CM

## 2019-04-09 DIAGNOSIS — M6281 Muscle weakness (generalized): Secondary | ICD-10-CM | POA: Diagnosis not present

## 2019-04-09 DIAGNOSIS — I6322 Cerebral infarction due to unspecified occlusion or stenosis of basilar arteries: Secondary | ICD-10-CM | POA: Diagnosis not present

## 2019-04-09 LAB — COMPREHENSIVE METABOLIC PANEL
ALT: 18 U/L (ref 0–44)
AST: 16 U/L (ref 15–41)
Albumin: 3.2 g/dL — ABNORMAL LOW (ref 3.5–5.0)
Alkaline Phosphatase: 83 U/L (ref 38–126)
Anion gap: 9 (ref 5–15)
BUN: 33 mg/dL — ABNORMAL HIGH (ref 8–23)
CO2: 19 mmol/L — ABNORMAL LOW (ref 22–32)
Calcium: 9.1 mg/dL (ref 8.9–10.3)
Chloride: 107 mmol/L (ref 98–111)
Creatinine, Ser: 2.08 mg/dL — ABNORMAL HIGH (ref 0.61–1.24)
GFR calc Af Amer: 35 mL/min — ABNORMAL LOW (ref >60)
GFR calc non Af Amer: 30 mL/min — ABNORMAL LOW (ref >60)
Glucose, Bld: 176 mg/dL — ABNORMAL HIGH (ref 70–99)
Potassium: 4.4 mmol/L (ref 3.5–5.1)
Sodium: 135 mmol/L (ref 135–145)
Total Bilirubin: 0.6 mg/dL (ref 0.3–1.2)
Total Protein: 7.9 g/dL (ref 6.5–8.1)

## 2019-04-09 LAB — CBG MONITORING, ED
Glucose-Capillary: 197 mg/dL — ABNORMAL HIGH (ref 70–99)
Glucose-Capillary: 215 mg/dL — ABNORMAL HIGH (ref 70–99)

## 2019-04-09 LAB — I-STAT CHEM 8, ED
BUN: 36 mg/dL — ABNORMAL HIGH (ref 8–23)
Calcium, Ion: 1.16 mmol/L (ref 1.15–1.40)
Chloride: 107 mmol/L (ref 98–111)
Creatinine, Ser: 2.1 mg/dL — ABNORMAL HIGH (ref 0.61–1.24)
Glucose, Bld: 174 mg/dL — ABNORMAL HIGH (ref 70–99)
HCT: 38 % — ABNORMAL LOW (ref 39.0–52.0)
Hemoglobin: 12.9 g/dL — ABNORMAL LOW (ref 13.0–17.0)
Potassium: 4.5 mmol/L (ref 3.5–5.1)
Sodium: 139 mmol/L (ref 135–145)
TCO2: 20 mmol/L — ABNORMAL LOW (ref 22–32)

## 2019-04-09 LAB — DIFFERENTIAL
Abs Immature Granulocytes: 0.03 10*3/uL (ref 0.00–0.07)
Basophils Absolute: 0 10*3/uL (ref 0.0–0.1)
Basophils Relative: 0 %
Eosinophils Absolute: 0.4 10*3/uL (ref 0.0–0.5)
Eosinophils Relative: 6 %
Immature Granulocytes: 0 %
Lymphocytes Relative: 19 %
Lymphs Abs: 1.3 10*3/uL (ref 0.7–4.0)
Monocytes Absolute: 0.5 10*3/uL (ref 0.1–1.0)
Monocytes Relative: 8 %
Neutro Abs: 4.5 10*3/uL (ref 1.7–7.7)
Neutrophils Relative %: 67 %

## 2019-04-09 LAB — APTT: aPTT: 35 seconds (ref 24–36)

## 2019-04-09 LAB — PROTIME-INR
INR: 1.1 (ref 0.8–1.2)
Prothrombin Time: 14 seconds (ref 11.4–15.2)

## 2019-04-09 LAB — CBC
HCT: 35.8 % — ABNORMAL LOW (ref 39.0–52.0)
Hemoglobin: 11.9 g/dL — ABNORMAL LOW (ref 13.0–17.0)
MCH: 27.6 pg (ref 26.0–34.0)
MCHC: 33.2 g/dL (ref 30.0–36.0)
MCV: 83.1 fL (ref 80.0–100.0)
Platelets: 232 10*3/uL (ref 150–400)
RBC: 4.31 MIL/uL (ref 4.22–5.81)
RDW: 14.4 % (ref 11.5–15.5)
WBC: 6.8 10*3/uL (ref 4.0–10.5)
nRBC: 0 % (ref 0.0–0.2)

## 2019-04-09 IMAGING — CT CT HEAD W/O CM
3 series · 14 of 47 positions shown, 16 images · non-contrast
Comparison: [DATE] head CT

CLINICAL DATA: Generalized weakness

EXAM:
CT HEAD WITHOUT CONTRAST
TECHNIQUE: Contiguous axial images were obtained from the base of the skull
through the vertex without intravenous contrast.

[Series 2: head 5.0 h30s · axial · 0.47mm/px · z∈[-106,+34]mm · 8 of 34 slices shown, 10 images]
[im 3/34  brain]
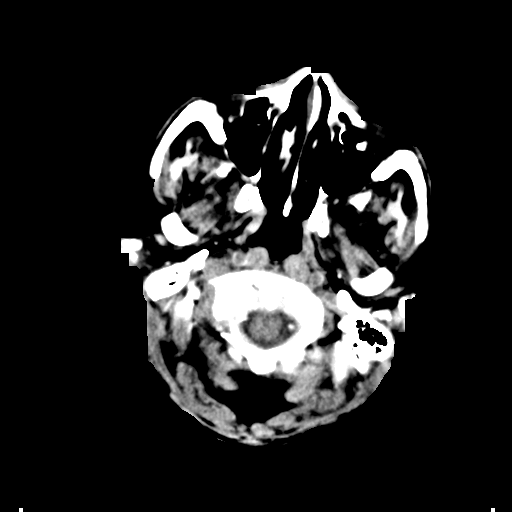
[im 3/34  bone]
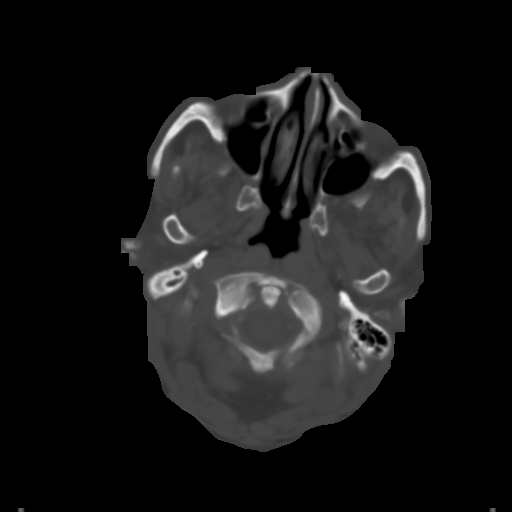
[im 7/34  brain]
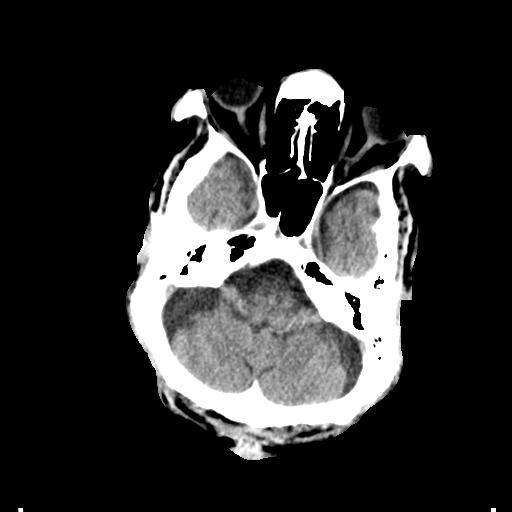
[im 11/34  brain]
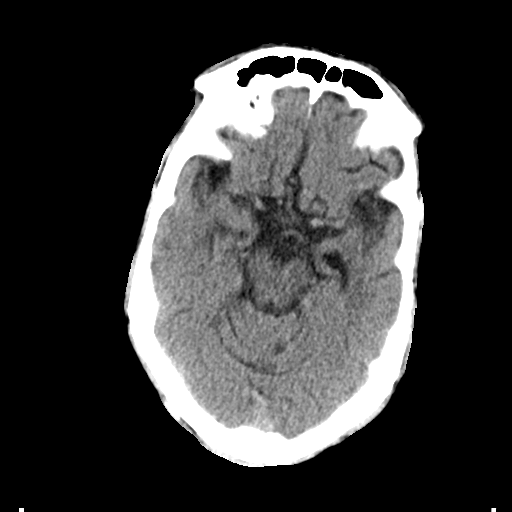
[im 15/34  brain]
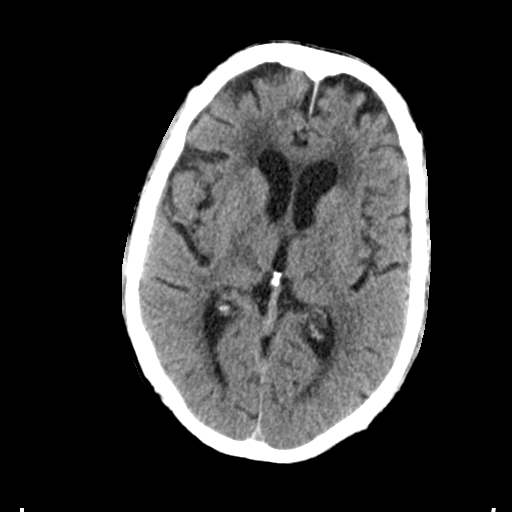
[im 19/34  brain]
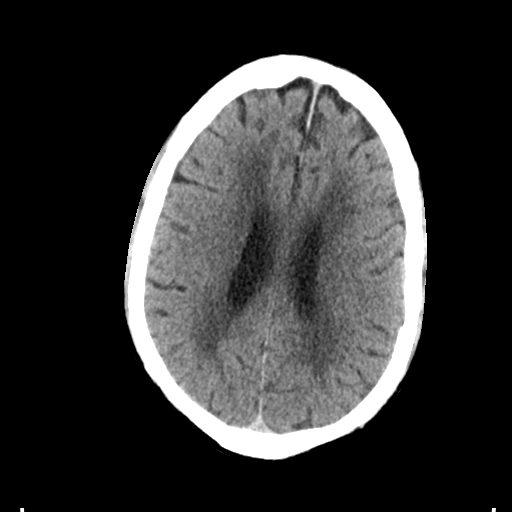
[im 19/34  bone]
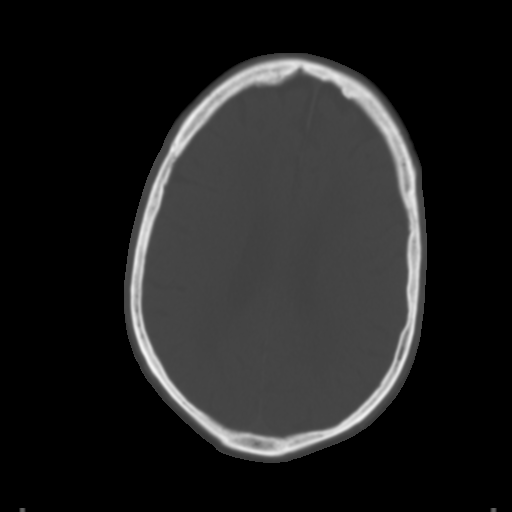
[im 23/34  brain]
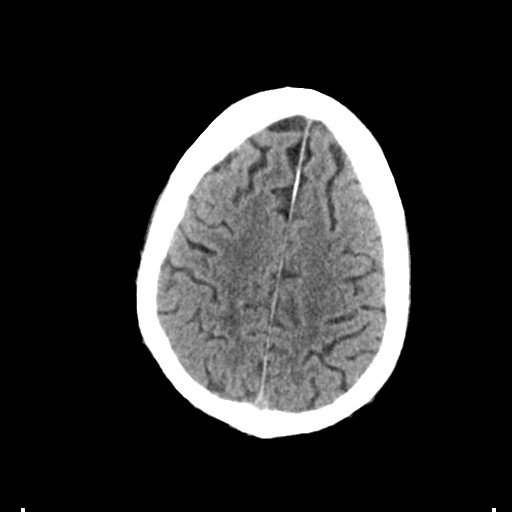
[im 27/34  brain]
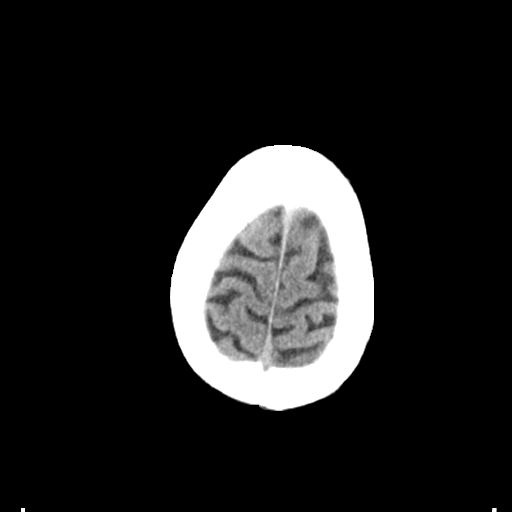
[im 31/34  brain]
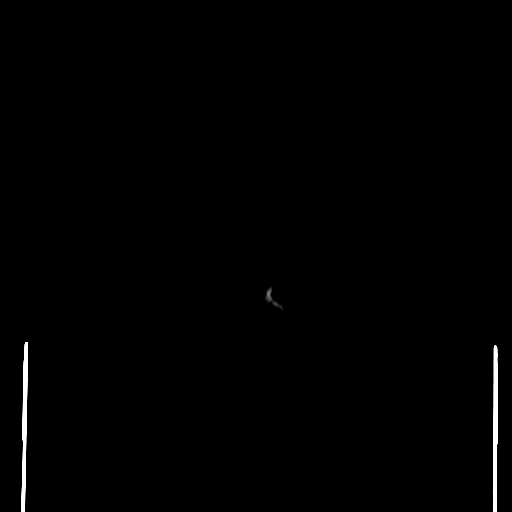

[Series 4: head 3.0 mpr cor · coronal · 0.33mm/px · 3 of 74 slices shown]
[im 25/74  brain]
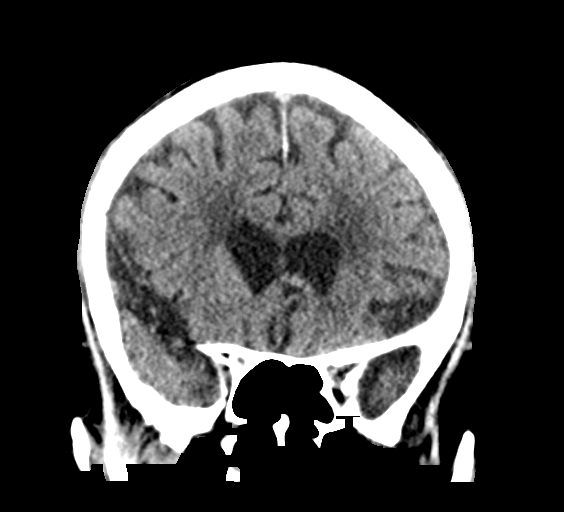
[im 33/74  brain]
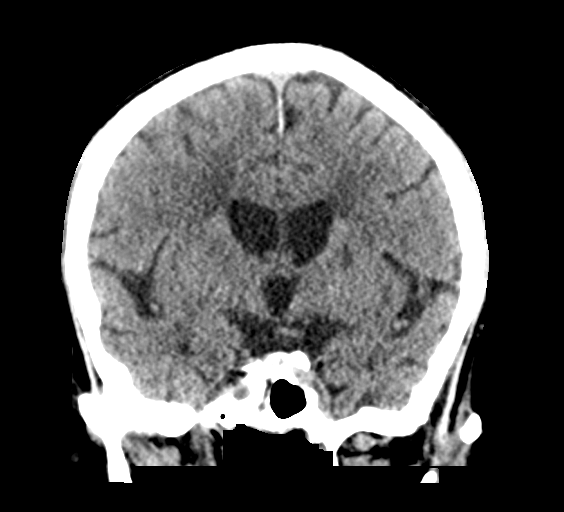
[im 41/74  brain]
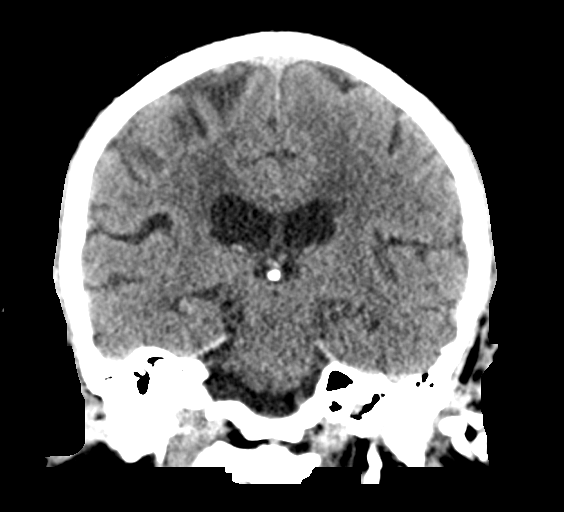

[Series 5: head 3.0 mpr sag · sagittal · 0.33mm/px · 3 of 60 slices shown]
[im 20/60  brain]
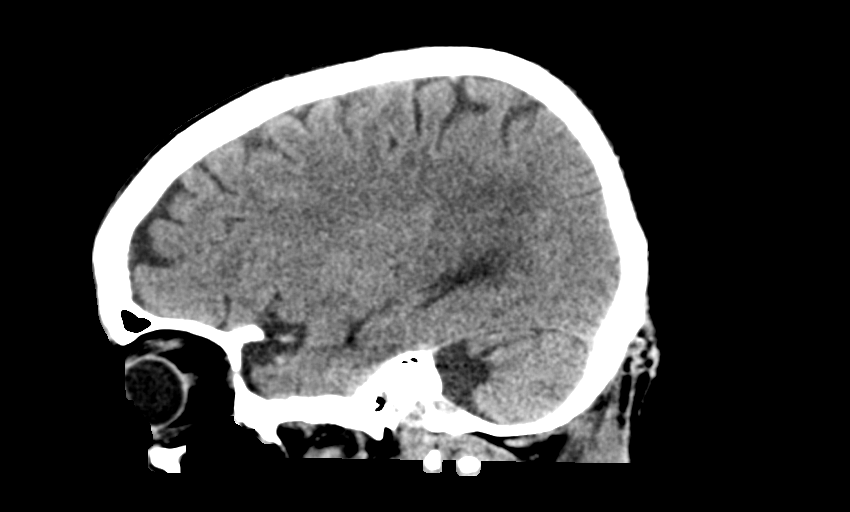
[im 30/60  brain]
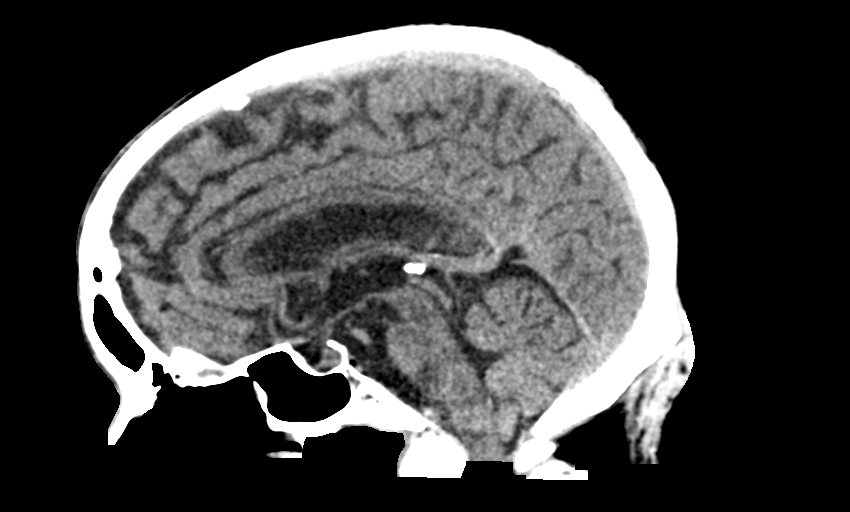
[im 40/60  brain]
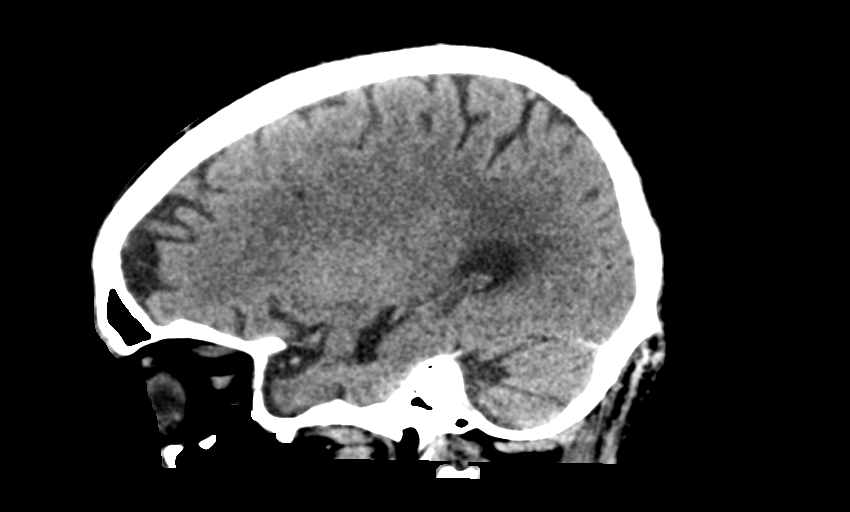

[14 of 47 positions shown; findings below may reference images not displayed]

FINDINGS: Brain: No acute territorial infarction, hemorrhage or intracranial
mass. Atrophy and moderate small vessel ischemic changes of the
white matter. Chronic lacunar infarcts in the bilateral basal
ganglia and thalamus. Stable ventricle size

Vascular: No hyperdense vessels.  Carotid vascular calcification

Skull: Normal. Negative for fracture or focal lesion.

Sinuses/Orbits: No acute finding.

Other: None
IMPRESSION: 1. No CT evidence for acute intracranial abnormality.
2. Atrophy and small vessel ischemic changes of the white matter.
Chronic appearing infarcts in the basal ganglia and thalamus

## 2019-04-09 MED ORDER — SODIUM CHLORIDE 0.9% FLUSH
3.0000 mL | Freq: Once | INTRAVENOUS | Status: AC
Start: 2019-04-09 — End: 2019-04-10
  Administered 2019-04-10: 03:00:00 3 mL via INTRAVENOUS

## 2019-04-09 NOTE — ED Notes (Signed)
Patient requesting to have blood sugar rechecked, states he feels like his blood sugar is dropping, this RN checked cbg which was 215. This RN advised patient if he needed [place] to check his sugar again to let staff know.

## 2019-04-09 NOTE — ED Triage Notes (Addendum)
Pt states generalized weakness and slurred speech x1 week. Pt states both legs are weak and he became unable to walk today.  Slight drift to left arm and slight L sided facial droop. Pt alert and oriented x4, speech slurred. This RN spoke with patient's wife who states pt had a fall Monday due to low blood sugar, ems came out but patient did not want to be transported to ED, pts wife states pt has been unable to walk normally since Saturday.

## 2019-04-09 NOTE — ED Notes (Signed)
Pt reported he felt like his sugar may be dropping to transporter while en route back to waiting room from CT. This RN checked CBG which was found to be 197. Pt remains a/ox4, speech remains slurred.

## 2019-04-10 ENCOUNTER — Emergency Department (HOSPITAL_COMMUNITY): Payer: Medicare HMO

## 2019-04-10 ENCOUNTER — Inpatient Hospital Stay (HOSPITAL_COMMUNITY): Payer: Medicare HMO

## 2019-04-10 ENCOUNTER — Inpatient Hospital Stay (HOSPITAL_COMMUNITY): Payer: Medicare HMO | Admitting: Anesthesiology

## 2019-04-10 ENCOUNTER — Encounter (HOSPITAL_COMMUNITY): Admission: EM | Disposition: E | Payer: Self-pay | Source: Home / Self Care | Attending: Pulmonary Disease

## 2019-04-10 ENCOUNTER — Encounter (HOSPITAL_COMMUNITY): Payer: Self-pay | Admitting: General Practice

## 2019-04-10 DIAGNOSIS — Y838 Other surgical procedures as the cause of abnormal reaction of the patient, or of later complication, without mention of misadventure at the time of the procedure: Secondary | ICD-10-CM | POA: Diagnosis not present

## 2019-04-10 DIAGNOSIS — R0902 Hypoxemia: Secondary | ICD-10-CM | POA: Diagnosis not present

## 2019-04-10 DIAGNOSIS — J9690 Respiratory failure, unspecified, unspecified whether with hypoxia or hypercapnia: Secondary | ICD-10-CM | POA: Diagnosis not present

## 2019-04-10 DIAGNOSIS — I5032 Chronic diastolic (congestive) heart failure: Secondary | ICD-10-CM | POA: Diagnosis present

## 2019-04-10 DIAGNOSIS — Z20828 Contact with and (suspected) exposure to other viral communicable diseases: Secondary | ICD-10-CM | POA: Diagnosis present

## 2019-04-10 DIAGNOSIS — E1165 Type 2 diabetes mellitus with hyperglycemia: Secondary | ICD-10-CM | POA: Diagnosis present

## 2019-04-10 DIAGNOSIS — G9341 Metabolic encephalopathy: Secondary | ICD-10-CM | POA: Diagnosis not present

## 2019-04-10 DIAGNOSIS — E782 Mixed hyperlipidemia: Secondary | ICD-10-CM | POA: Diagnosis not present

## 2019-04-10 DIAGNOSIS — N189 Chronic kidney disease, unspecified: Secondary | ICD-10-CM | POA: Diagnosis not present

## 2019-04-10 DIAGNOSIS — I679 Cerebrovascular disease, unspecified: Secondary | ICD-10-CM | POA: Diagnosis not present

## 2019-04-10 DIAGNOSIS — D649 Anemia, unspecified: Secondary | ICD-10-CM | POA: Diagnosis not present

## 2019-04-10 DIAGNOSIS — N184 Chronic kidney disease, stage 4 (severe): Secondary | ICD-10-CM

## 2019-04-10 DIAGNOSIS — N179 Acute kidney failure, unspecified: Secondary | ICD-10-CM | POA: Diagnosis not present

## 2019-04-10 DIAGNOSIS — D62 Acute posthemorrhagic anemia: Secondary | ICD-10-CM | POA: Diagnosis not present

## 2019-04-10 DIAGNOSIS — G8250 Quadriplegia, unspecified: Secondary | ICD-10-CM | POA: Diagnosis present

## 2019-04-10 DIAGNOSIS — I672 Cerebral atherosclerosis: Secondary | ICD-10-CM | POA: Diagnosis present

## 2019-04-10 DIAGNOSIS — I6389 Other cerebral infarction: Secondary | ICD-10-CM | POA: Diagnosis not present

## 2019-04-10 DIAGNOSIS — I639 Cerebral infarction, unspecified: Secondary | ICD-10-CM

## 2019-04-10 DIAGNOSIS — N471 Phimosis: Secondary | ICD-10-CM | POA: Diagnosis not present

## 2019-04-10 DIAGNOSIS — I6340 Cerebral infarction due to embolism of unspecified cerebral artery: Secondary | ICD-10-CM

## 2019-04-10 DIAGNOSIS — Z794 Long term (current) use of insulin: Secondary | ICD-10-CM | POA: Diagnosis not present

## 2019-04-10 DIAGNOSIS — R9431 Abnormal electrocardiogram [ECG] [EKG]: Secondary | ICD-10-CM | POA: Diagnosis present

## 2019-04-10 DIAGNOSIS — I130 Hypertensive heart and chronic kidney disease with heart failure and stage 1 through stage 4 chronic kidney disease, or unspecified chronic kidney disease: Secondary | ICD-10-CM | POA: Diagnosis present

## 2019-04-10 DIAGNOSIS — N183 Chronic kidney disease, stage 3 unspecified: Secondary | ICD-10-CM | POA: Diagnosis present

## 2019-04-10 DIAGNOSIS — R29721 NIHSS score 21: Secondary | ICD-10-CM | POA: Diagnosis present

## 2019-04-10 DIAGNOSIS — R471 Dysarthria and anarthria: Secondary | ICD-10-CM | POA: Diagnosis not present

## 2019-04-10 DIAGNOSIS — R042 Hemoptysis: Secondary | ICD-10-CM | POA: Diagnosis not present

## 2019-04-10 DIAGNOSIS — Z515 Encounter for palliative care: Secondary | ICD-10-CM | POA: Diagnosis not present

## 2019-04-10 DIAGNOSIS — L7632 Postprocedural hematoma of skin and subcutaneous tissue following other procedure: Secondary | ICD-10-CM | POA: Diagnosis not present

## 2019-04-10 DIAGNOSIS — Z66 Do not resuscitate: Secondary | ICD-10-CM | POA: Diagnosis not present

## 2019-04-10 DIAGNOSIS — Z978 Presence of other specified devices: Secondary | ICD-10-CM | POA: Diagnosis not present

## 2019-04-10 DIAGNOSIS — N401 Enlarged prostate with lower urinary tract symptoms: Secondary | ICD-10-CM | POA: Diagnosis not present

## 2019-04-10 DIAGNOSIS — I63219 Cerebral infarction due to unspecified occlusion or stenosis of unspecified vertebral arteries: Secondary | ICD-10-CM | POA: Diagnosis not present

## 2019-04-10 DIAGNOSIS — I63211 Cerebral infarction due to unspecified occlusion or stenosis of right vertebral arteries: Secondary | ICD-10-CM | POA: Diagnosis present

## 2019-04-10 DIAGNOSIS — T17998A Other foreign object in respiratory tract, part unspecified causing other injury, initial encounter: Secondary | ICD-10-CM

## 2019-04-10 DIAGNOSIS — I10 Essential (primary) hypertension: Secondary | ICD-10-CM | POA: Diagnosis not present

## 2019-04-10 DIAGNOSIS — R338 Other retention of urine: Secondary | ICD-10-CM | POA: Diagnosis not present

## 2019-04-10 DIAGNOSIS — T68XXXA Hypothermia, initial encounter: Secondary | ICD-10-CM | POA: Diagnosis not present

## 2019-04-10 DIAGNOSIS — E1140 Type 2 diabetes mellitus with diabetic neuropathy, unspecified: Secondary | ICD-10-CM | POA: Diagnosis not present

## 2019-04-10 DIAGNOSIS — E1122 Type 2 diabetes mellitus with diabetic chronic kidney disease: Secondary | ICD-10-CM | POA: Diagnosis present

## 2019-04-10 DIAGNOSIS — I9581 Postprocedural hypotension: Secondary | ICD-10-CM | POA: Diagnosis not present

## 2019-04-10 DIAGNOSIS — D6489 Other specified anemias: Secondary | ICD-10-CM | POA: Diagnosis present

## 2019-04-10 DIAGNOSIS — R4701 Aphasia: Secondary | ICD-10-CM | POA: Diagnosis present

## 2019-04-10 DIAGNOSIS — I6322 Cerebral infarction due to unspecified occlusion or stenosis of basilar arteries: Secondary | ICD-10-CM | POA: Diagnosis not present

## 2019-04-10 DIAGNOSIS — J9601 Acute respiratory failure with hypoxia: Secondary | ICD-10-CM | POA: Diagnosis not present

## 2019-04-10 HISTORY — PX: RADIOLOGY WITH ANESTHESIA: SHX6223

## 2019-04-10 LAB — URINALYSIS, ROUTINE W REFLEX MICROSCOPIC
Bilirubin Urine: NEGATIVE
Glucose, UA: 50 mg/dL — AB
Ketones, ur: NEGATIVE mg/dL
Nitrite: NEGATIVE
Protein, ur: 100 mg/dL — AB
Specific Gravity, Urine: 1.008 (ref 1.005–1.030)
pH: 5 (ref 5.0–8.0)

## 2019-04-10 LAB — CBG MONITORING, ED
Glucose-Capillary: 187 mg/dL — ABNORMAL HIGH (ref 70–99)
Glucose-Capillary: 201 mg/dL — ABNORMAL HIGH (ref 70–99)
Glucose-Capillary: 210 mg/dL — ABNORMAL HIGH (ref 70–99)

## 2019-04-10 LAB — ECHOCARDIOGRAM COMPLETE
Height: 69 in
Weight: 2680.79 oz

## 2019-04-10 LAB — GLUCOSE, CAPILLARY
Glucose-Capillary: 174 mg/dL — ABNORMAL HIGH (ref 70–99)
Glucose-Capillary: 220 mg/dL — ABNORMAL HIGH (ref 70–99)

## 2019-04-10 LAB — MRSA PCR SCREENING: MRSA by PCR: NEGATIVE

## 2019-04-10 LAB — HEPARIN LEVEL (UNFRACTIONATED): Heparin Unfractionated: 0.39 IU/mL (ref 0.30–0.70)

## 2019-04-10 LAB — SARS CORONAVIRUS 2 (TAT 6-24 HRS): SARS Coronavirus 2: NEGATIVE

## 2019-04-10 IMAGING — DX DG CHEST 1V PORT
1 series · 1 of 1 positions shown · non-contrast
Comparison: [DATE]

CLINICAL DATA: Generalized weakness with slurred speech

EXAM:
PORTABLE CHEST 1 VIEW

[chest ap]
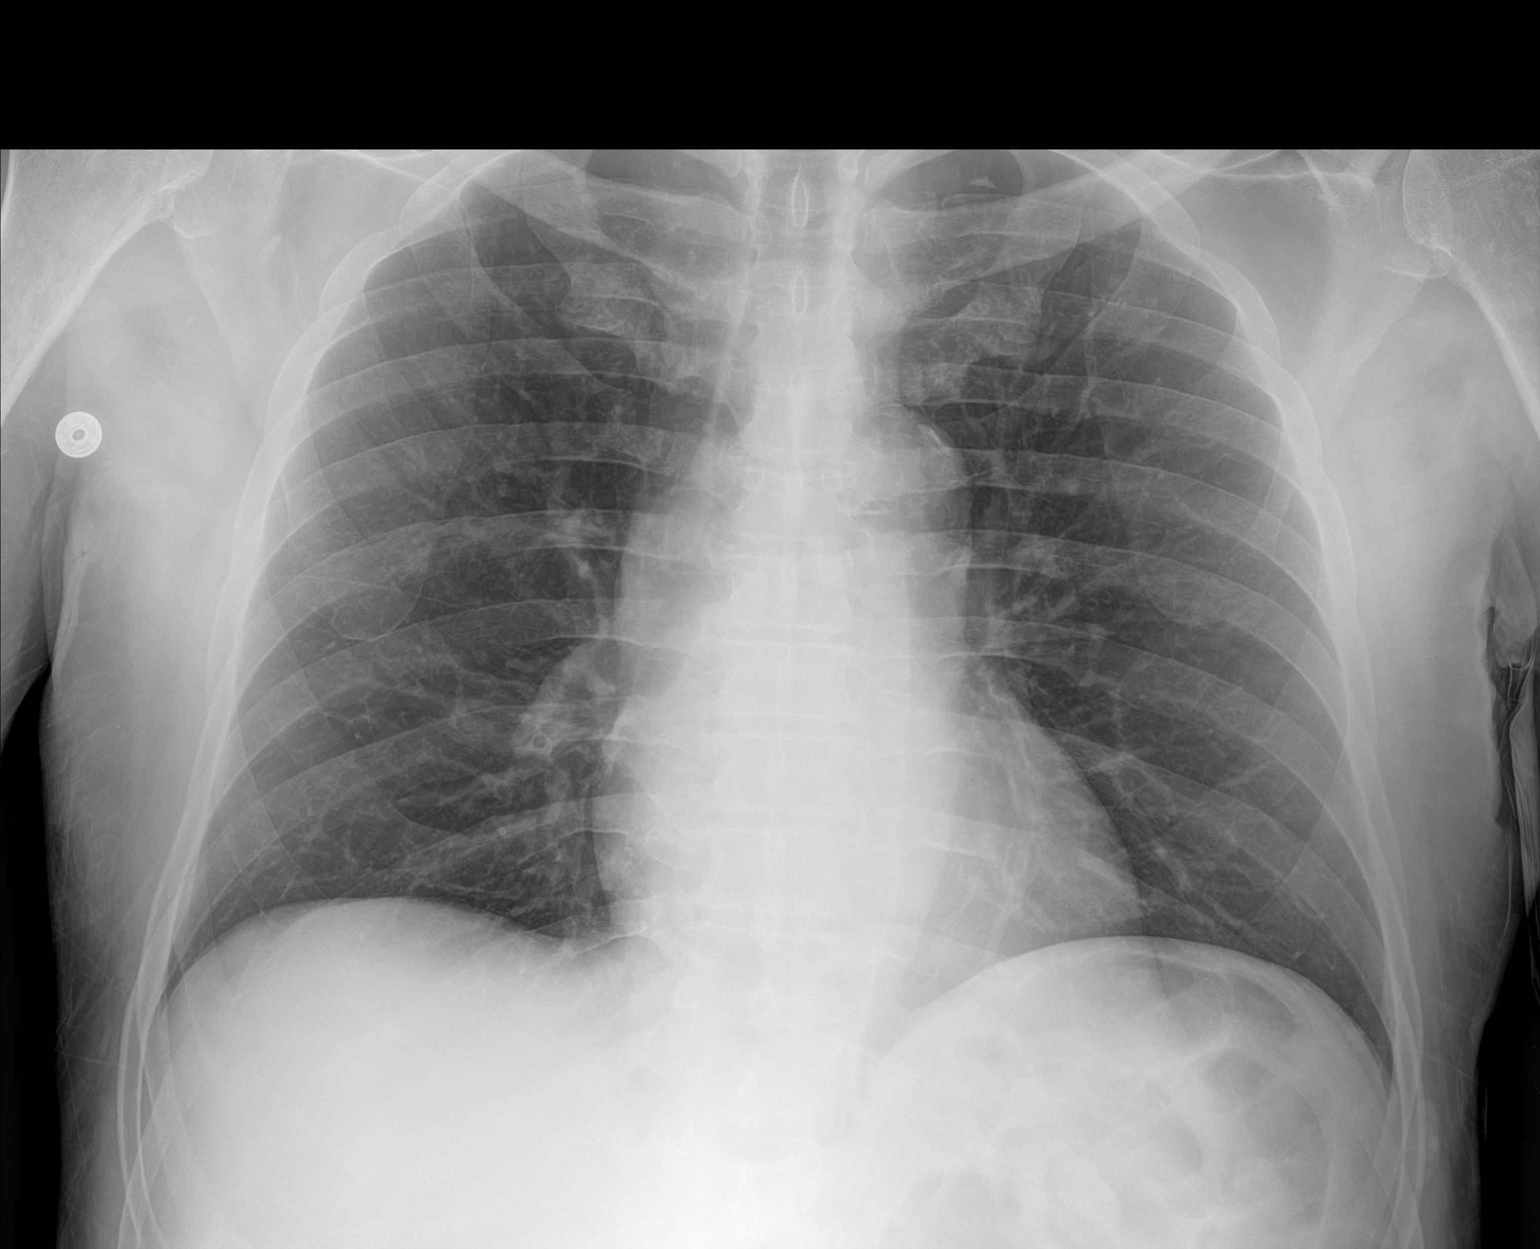

[1 of 1 positions shown; findings below may reference images not displayed]

FINDINGS: The heart size and mediastinal contours are within normal limits.
Both lungs are clear. The visualized skeletal structures are
unremarkable.
IMPRESSION: No active disease.

## 2019-04-10 IMAGING — XA IR  CT HEAD LIMITED
2 series · 13 of 24 positions shown · non-contrast
Comparison: An MRI MRA of the brain [DATE].

INDICATION: Worsening neurological function with bilateral upper and lower
extremity weakness left greater than right, and also severe
dysarthria.

EXAM:
1. EMERGENT LARGE VESSEL OCCLUSION THROMBOLYSIS (POSTERIOR
CIRCULATION)
TECHNIQUE: Following a full explanation of the procedure along with the
potential associated complications, an informed witnessed consent
was obtained patient's spouse. The risks of intracranial hemorrhage
of 10%, worsening neurological deficit, ventilator dependency, death
and inability to revascularize were all reviewed in detail with the
patient's spouse.

[Series 28: <mpr range>3 · axial · 0.5mm · 0.40mm/px · z∈[-263,-74]mm · 7 of 34 slices shown]
[im 1/34]
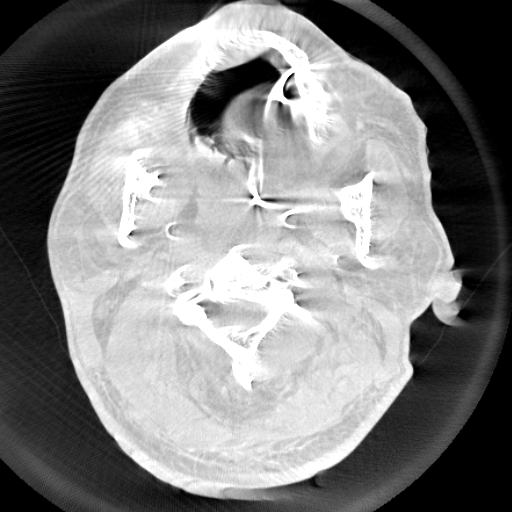
[im 6/34]
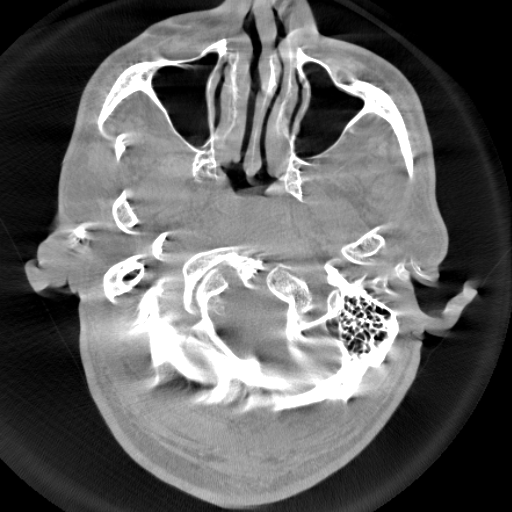
[im 12/34]
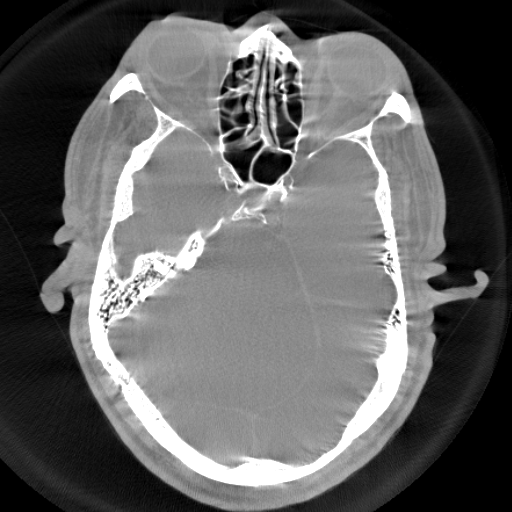
[im 17/34]
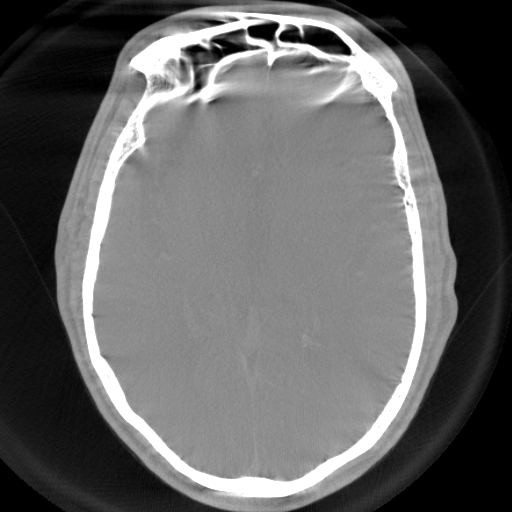
[im 23/34]
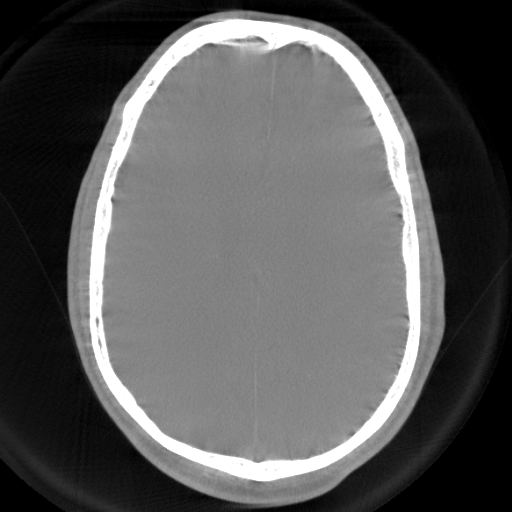
[im 28/34]
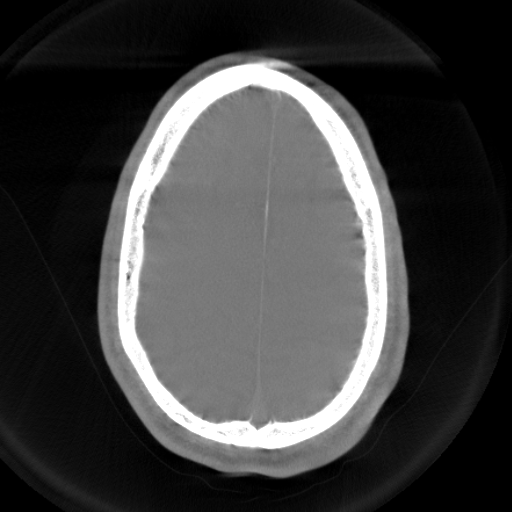
[im 34/34]
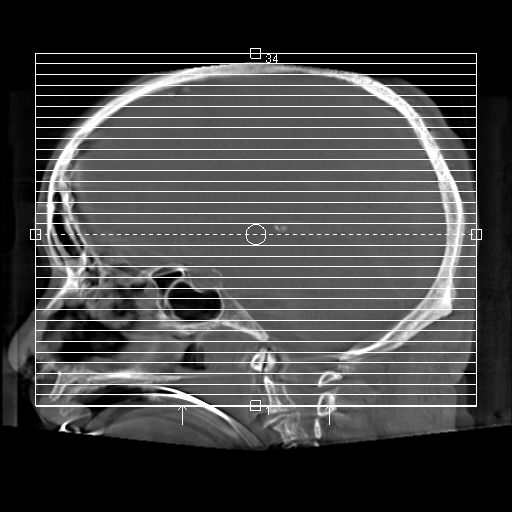

[Series 28: <mpr range>2 · axial · 0.5mm · 0.33mm/px · z∈[-249,-73]mm · 6 of 31 slices shown]
[im 1/31]
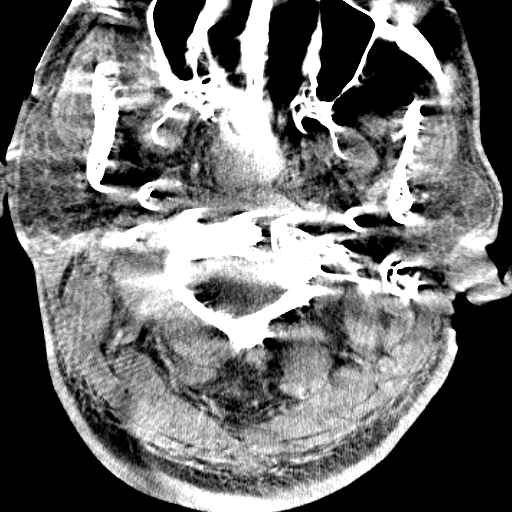
[im 7/31]
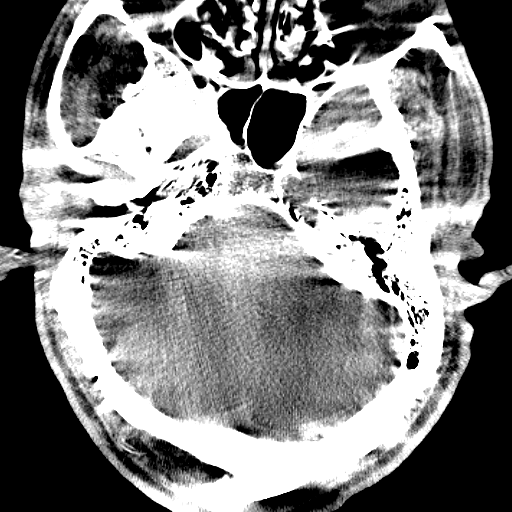
[im 13/31]
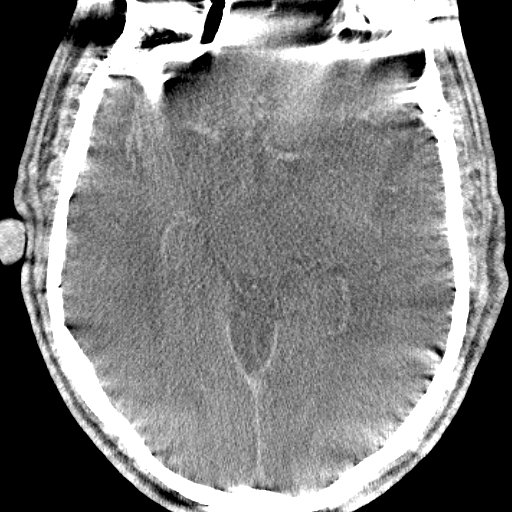
[im 19/31]
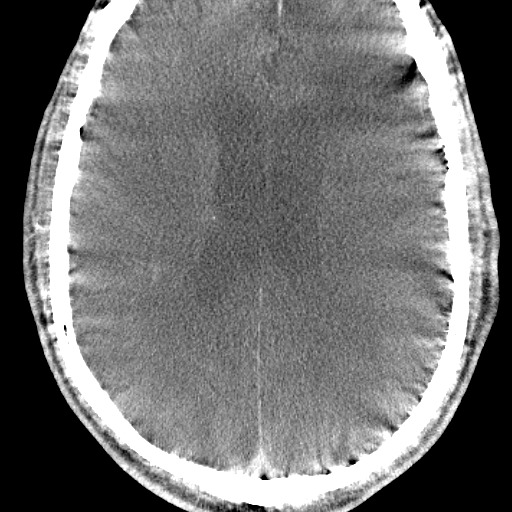
[im 25/31]
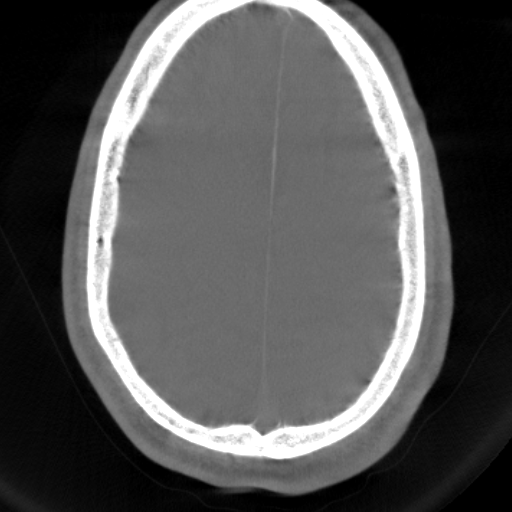
[im 31/31]
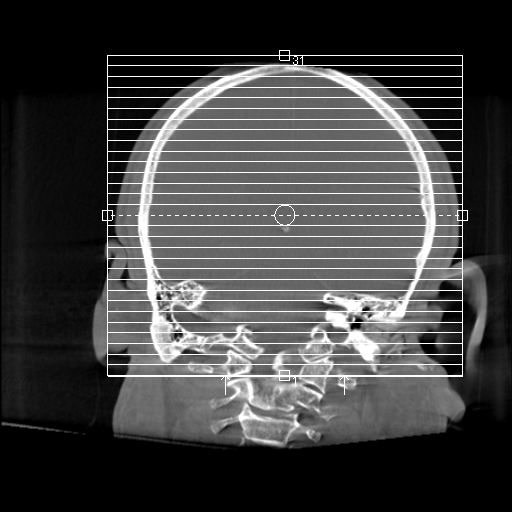

[13 of 24 positions shown; findings below may reference images not displayed]

MEDICATIONS:
Ancef 2 g IV antibiotic was administered within 1 hour of the
procedure.

ANESTHESIA/SEDATION:
General anesthesia.

CONTRAST:  Isovue 300 approximately 150 mL.

FLUOROSCOPY TIME:  Fluoroscopy Time: 65 minutes 18 seconds ([R9]
mGy).

COMPLICATIONS:
None immediate.
The patient was then put under general anesthesia by the [REDACTED] at [HOSPITAL].

The right groin was prepped and draped in the usual sterile fashion.
Thereafter using modified Seldinger technique, transfemoral access
into the right common femoral artery was obtained without
difficulty. Over a 0.035 inch guidewire a 5 French Pinnacle sheath
was inserted. Through this, and also over a 0.035 inch guidewire a 5
French JB 1 catheter was advanced to the aortic arch region and
selectively positioned in the right subclavian artery and the right
common carotid artery.
FINDINGS: The right subclavian arteriogram demonstrates approximately 50%
stenosis of the proximal [DATE] of the right vertebral artery.

A selective right vertebral artery angiogram demonstrates the 50%
right proximal vertebral artery stenosis with slow ascent of
contrast to the cranial skull base. Severe near occlusive stenosis
is seen at the mid cervical right vertebral artery level.

Distal to this there is slow ascent of contrast to the right
vertebral artery at the cranial skull base. There is slow
advancement of contrast in an antegrade fashion to the right
vertebrobasilar junction and the proximal basilar artery.

Poor filling of contrast is noted in the proximal basilar artery and
the distal basilar artery.

The right common carotid arteriogram demonstrates the right external
carotid artery and its major branches to be widely patent.

The right internal carotid artery at the bulb to the cranial skull
base demonstrates a tortuous unimpeded course.

The petrous, the cavernous and the supraclinoid segments are widely
patent.

There is mild to moderate stenosis of the petrous cavernous
junction.

A right posterior communicating artery is seen opacifying the right
posterior cerebral distribution.

The right middle cerebral artery and the right anterior cerebral
artery opacify into the capillary and venous phases.

PROCEDURE:
The diagnostic JB 1 catheter in the distal right subclavian artery
was then exchanged for a 0.035 inch 300 cm Rosen exchange guidewire
for an 8 French Pinnacle sheath in the right groin and was connected
to continuous heparinized saline infusion.

Over the Rosen exchange guidewire, a 80 cm Neuron 7 guide catheter
was advanced and positioned just proximal to the right vertebral
artery origin.

The exchange guidewire was removed. Good aspiration obtained from
the hub of the Neuron guide catheter.

Over a 0.014 inch standard Synchro micro guidewire, an 021 Phenom
microcatheter was advanced to just distal to the tip of the Neuron
guide catheter.

Using a torque device, after multiple manipulations with micro
guidewire tip, access was obtained into the right vertebral artery
followed by the microcatheter to the distal right vertebral artery.

The guidewire was removed. Good aspiration was obtained from the hub
of the microcatheter. This in turn was then exchanged for a 014 inch
Softip standard BMW exchange micro guidewire. The micro guidewire
tip had a J configuration to avoid dissections or inducing spasm.
The microcatheter was retrieved and removed. A control arteriogram
performed through the Neuron guide catheter at the origin of the
right vertebral artery demonstrated the proximal 50% stenosis at the
proximal aspect of the right vertebral artery.

Again demonstrated was a severe high-grade pre occlusive segmental
stenosis of the mid right vertebral artery.

Measurements were performed of the right vertebral artery in its
most normal segment. It was elected to proceed with balloon
angioplasty to improve revascularization at the level of the pre
occlusive mid cervical right vertebral artery stenosis.

[REDACTED] 3 mm x 15 mm angioplasty balloon catheter was prepped and
draped in the usual fashion.

Using the over-the-wire technique, this was then advanced and
positioned with the distal and the proximal markers adequate
distance from the site of severe stenosis.

Control inflation was then performed to cover the proximal and
distal portions of the long segment pre occlusive stenosis.

Inflations were performed to approximately 3 mm where it was
maintained for approximately 45 seconds. Upon deflation and more
proximal angioplasty followed by deflation, control arteriogram
performed through the Neuron guide catheter at the origin of the
right vertebral artery demonstrated significantly improved caliber
and flow hemodynamically to the right vertebrobasilar junction.
There was now much clearer angiographic opacification of the right
vertebrobasilar junction, the proximal basilar artery and also the
distal basilar artery.

High-grade stenosis was identified of the right vertebrobasilar
junction just distal to the right posterior-inferior cerebellar
artery, and also the proximal basilar artery.

There was now opacification also noted of the superior cerebellar
arteries and also the anterior-inferior cerebellar arteries
bilaterally.

Also demonstrated was retrograde opacification of the left
vertebrobasilar junction to the left posterior-inferior cerebellar
artery.

Also 70% degree stenosis was identified in the right vertebrobasilar
junction distal to the right posterior-inferior cerebellar artery,
and also the proximal basilar artery. There continued to be
excellent flow unimpeded, however, through this to the distal
basilar artery.

Partial filling of the posterior cerebral arteries at the origin was
also noted.

Four attempts were made to access the distal right vertebrobasilar
junction and proximal basilar artery as areas of approximately 70%
narrowing for balloon angioplasty with a 1.5 mm x 15 mm [REDACTED]
balloon catheter.

However, the balloon angioplasty microcatheter was not long enough
to reach the two areas mentioned. It was also partially complicated
by the inability to advance the Neuron guide catheter into the
distal vertebral artery on account of the stenosis in the proximal
right vertebral artery.

This was despite using multiple different wires and micro catheters
for support. Given that there was significant flow to the right
vertebrobasilar junction and hypoplastic vertebrobasilar system on
developmental basis by balloon angioplasty of the near occlusive mid
cervical segment of the right vertebral artery it was decided to
stop.

The exchange micro guidewire was removed. A final control
arteriogram performed through the Neuron guide catheter at the
origin of the right vertebral artery continued to demonstrate
excellent flow proximally and also distally.

The patient was given a loading dose of aspirin of 81 mg, and
Brilinta 180 mg prior to the balloon angioplasty. Also the patient
was given a total of [R9] units of IV heparin.

The Neuron guide catheter was removed. The 8 French Pinnacle sheath
was then removed with hemostasis achieved with manual compression
over about 30 minutes.

Distal pulses remained Dopplerable in the posterior tibial, and the
dorsalis pedis arteries bilaterally.

Patient's CT scan obtained of the brain demonstrated no evidence of
hemorrhage, or mass effect or midline shift.

The patient was then extubated. He was slow in recovering but
eventually was able to open his eyes to command. He was breathing
adequately on his own. However, there was minimal movement in his
upper and lower extremities.

He was then transferred to the neuro ICU for post revascularization
management.
IMPRESSION: Status post endovascular revascularization of near occlusive long
segment stenosis of the mid [DATE] of the right vertebral artery with
significantly improved caliber and flow through angioplastied vessel
into the basilar artery and the right vertebrobasilar junction.

PLAN:
Close follow-up with potential for proximal basilar artery and right
vertebral artery angioplasty at a future time probably via the right
radial artery route.

## 2019-04-10 IMAGING — CT CT HEAD W/O CM
3 of 4 series · 13 of 47 positions shown, 15 images · non-contrast
Comparison: Prior MRI from earlier the same day.

CLINICAL DATA: Follow-up examination for acute stroke.

EXAM:
CT HEAD WITHOUT CONTRAST
TECHNIQUE: Contiguous axial images were obtained from the base of the skull
through the vertex without intravenous contrast.

[Series 3: head without · axial · non-contrast · 0.49mm/px · z∈[-160,-25]mm · 7 of 37 slices shown, 9 images]
[im 5/37  brain]
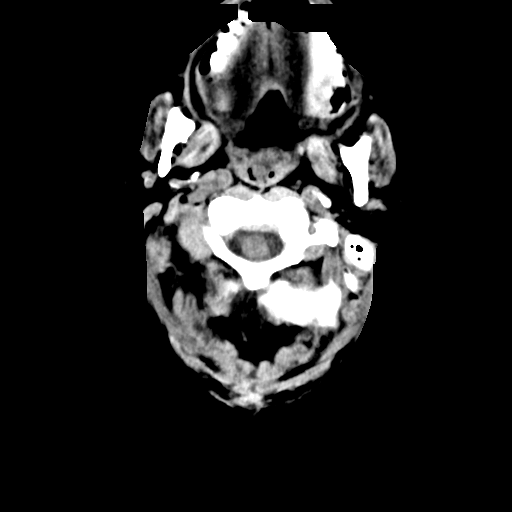
[im 5/37  bone]
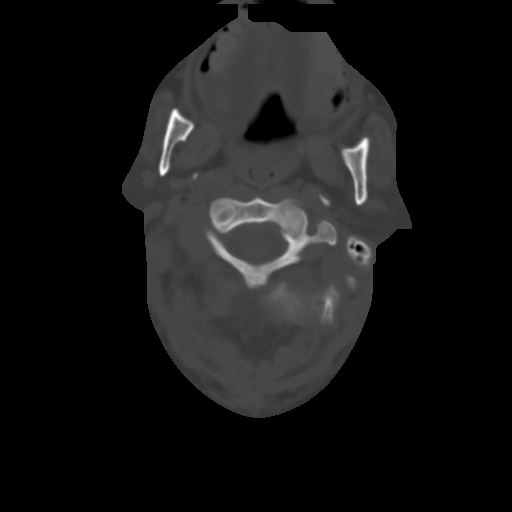
[im 10/37  brain]
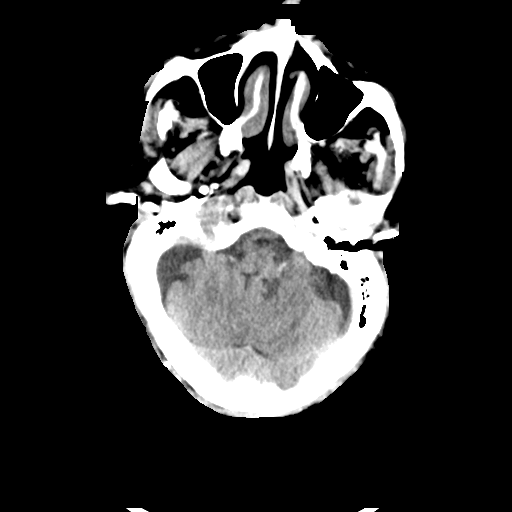
[im 14/37  brain]
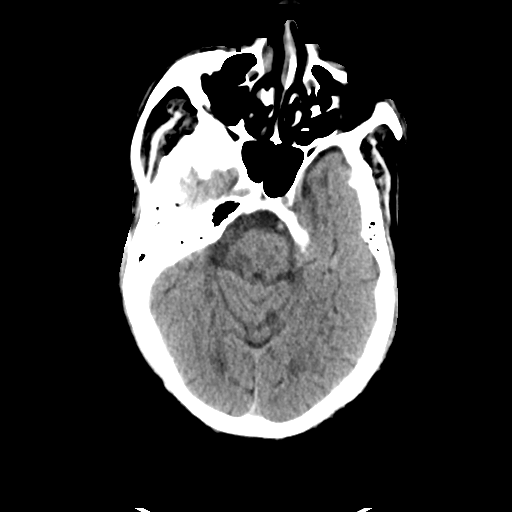
[im 19/37  brain]
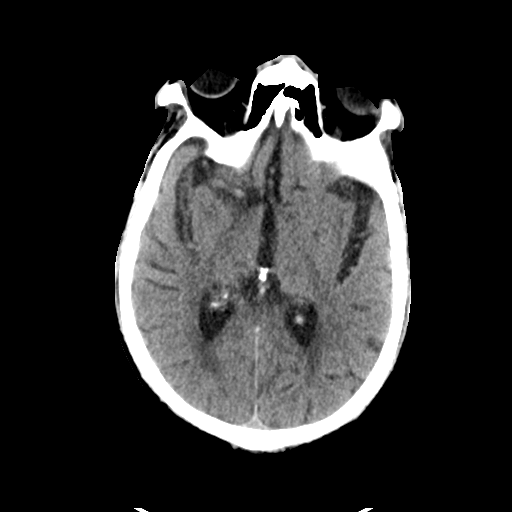
[im 23/37  brain]
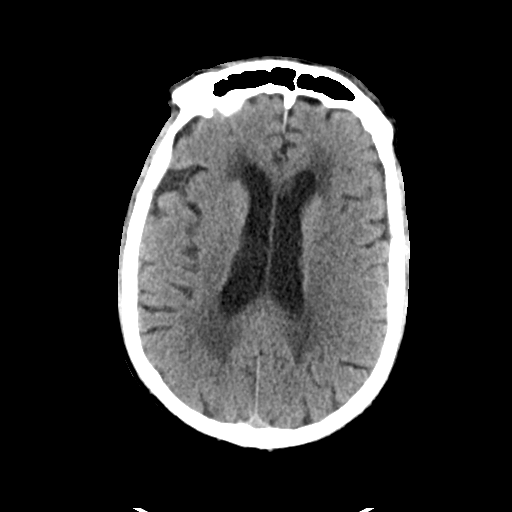
[im 23/37  bone]
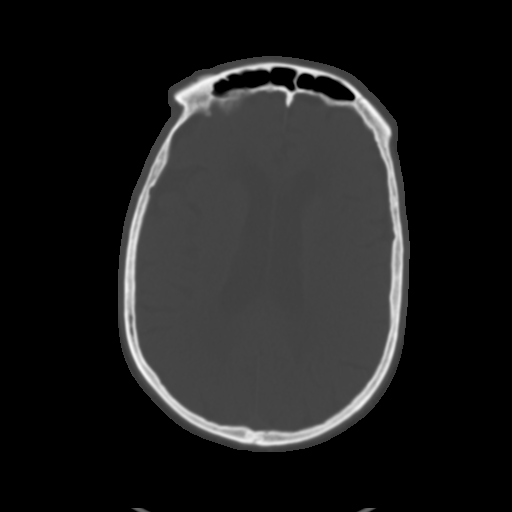
[im 28/37  brain]
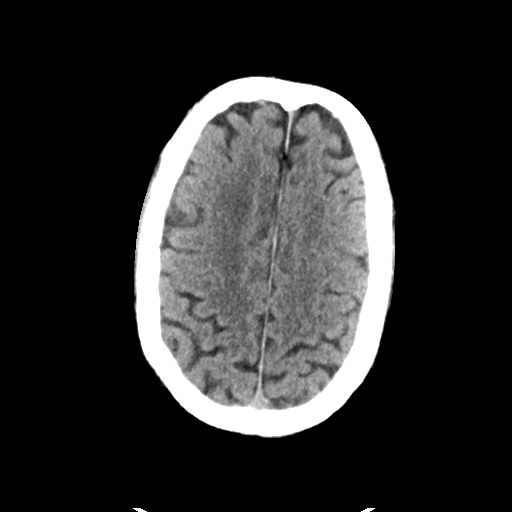
[im 32/37  brain]
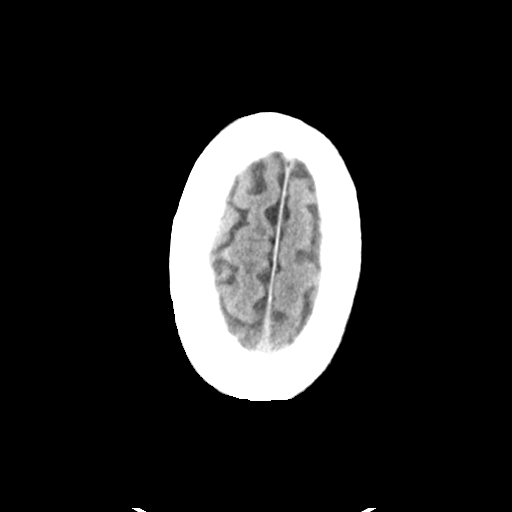

[Series 5: head without cor · coronal · non-contrast · 0.36mm/px · 3 of 84 slices shown]
[im 28/84  brain]
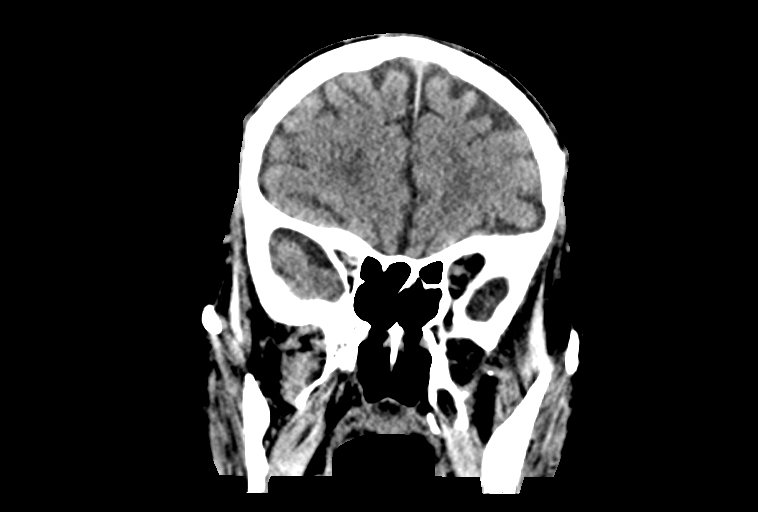
[im 37/84  brain]
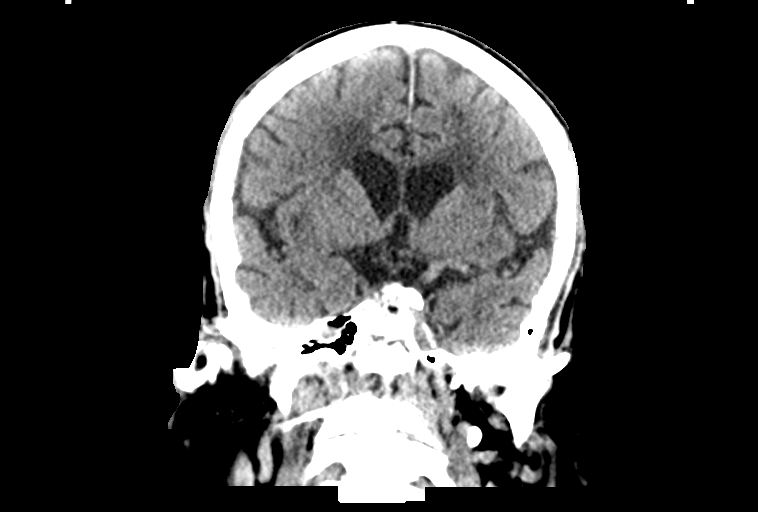
[im 47/84  brain]
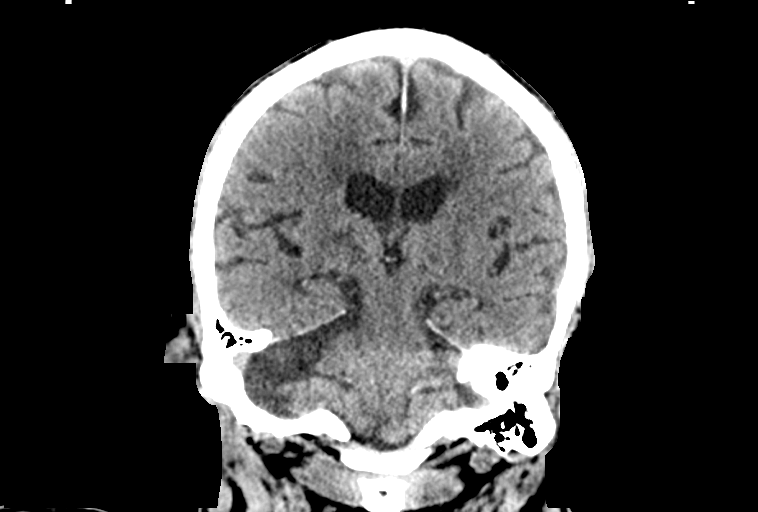

[Series 6: head without sag · sagittal · non-contrast · 0.36mm/px · 3 of 67 slices shown]
[im 23/67  brain]
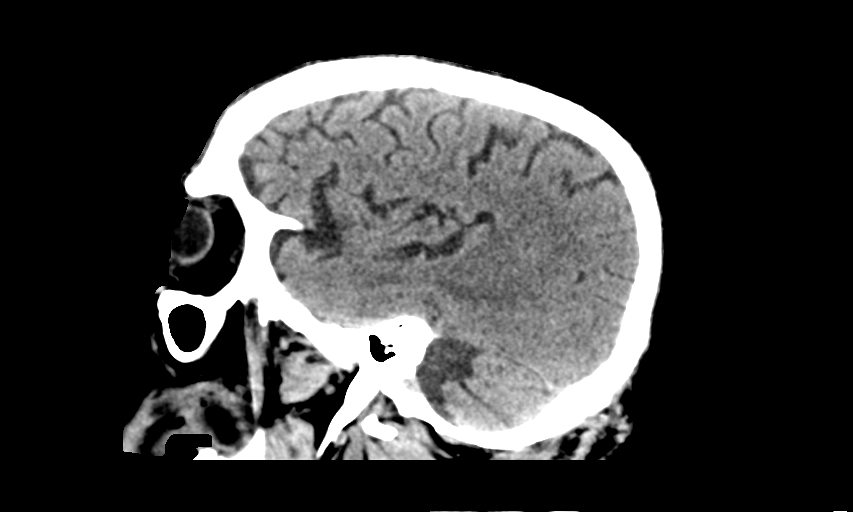
[im 34/67  brain]
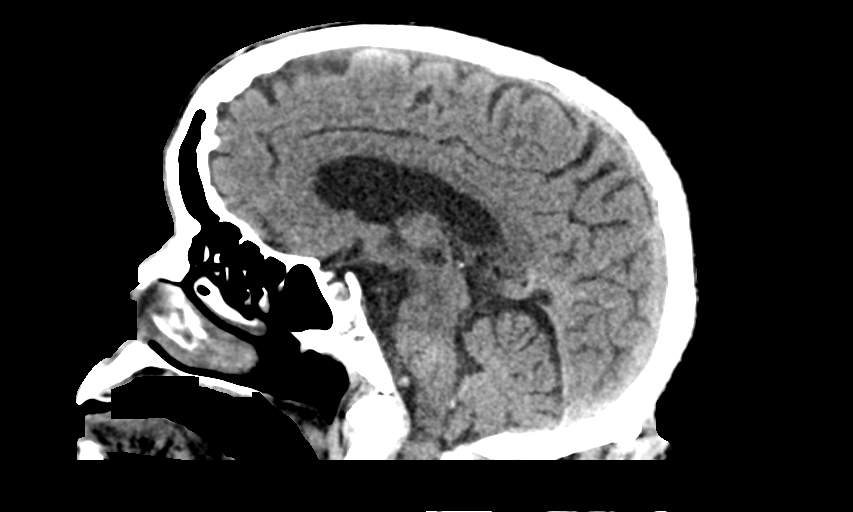
[im 45/67  brain]
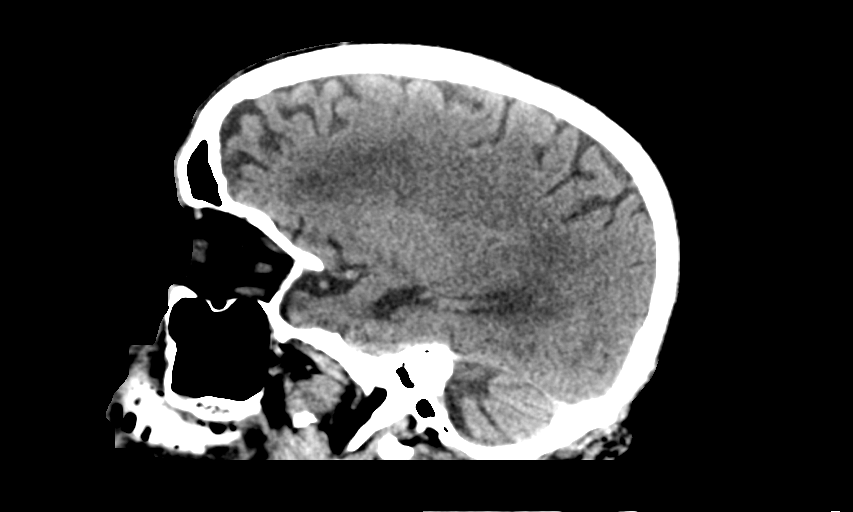

[13 of 47 positions shown; findings below may reference images not displayed]

FINDINGS: Brain: Previously identified small acute ischemic infarcts involving
the ventral medulla, left cerebellum, and right perisylvian cortex
are grossly stable, better seen on recent MRI. No evidence for
hemorrhagic transformation or other complication. No other acute
large vessel territory infarct. No intracranial hemorrhage. Atrophy
with chronic microvascular ischemic disease again noted.
Superimposed remote lacunar infarcts seen involving the left basal
ganglia and bilateral thalami. No mass lesion or midline shift. No
hydrocephalus. No extra-axial fluid collection.

Vascular: No hyperdense vessel. Calcified atherosclerosis at the
skull base.

Skull: Scalp soft tissues and calvarium within normal limits.

Sinuses/Orbits: Globes and orbital soft tissues within normal
limits. Paranasal sinuses remain largely clear. No significant
mastoid effusion.

Other: None.
IMPRESSION: 1. Grossly stable small acute ischemic infarcts involving the
ventral medulla, left cerebellum, and right perisylvian cortex. No
evidence for hemorrhagic transformation or other complication.
2. No other new acute intracranial abnormality.

## 2019-04-10 IMAGING — XA IR PTA INTRACRANIAL
10 of 24 series · 10 of 24 positions shown · IV contrast (IODINE)
Comparison: An MRI MRA of the brain [DATE].

INDICATION: Worsening neurological function with bilateral upper and lower
extremity weakness left greater than right, and also severe
dysarthria.

EXAM:
1. EMERGENT LARGE VESSEL OCCLUSION THROMBOLYSIS (POSTERIOR
CIRCULATION)
TECHNIQUE: Following a full explanation of the procedure along with the
potential associated complications, an informed witnessed consent
was obtained patient's spouse. The risks of intracranial hemorrhage
of 10%, worsening neurological deficit, ventilator dependency, death
and inability to revascularize were all reviewed in detail with the
patient's spouse.

[Series 17: cerebral · 1 of 8 slices shown (1 of 10)]
[im 1/8]
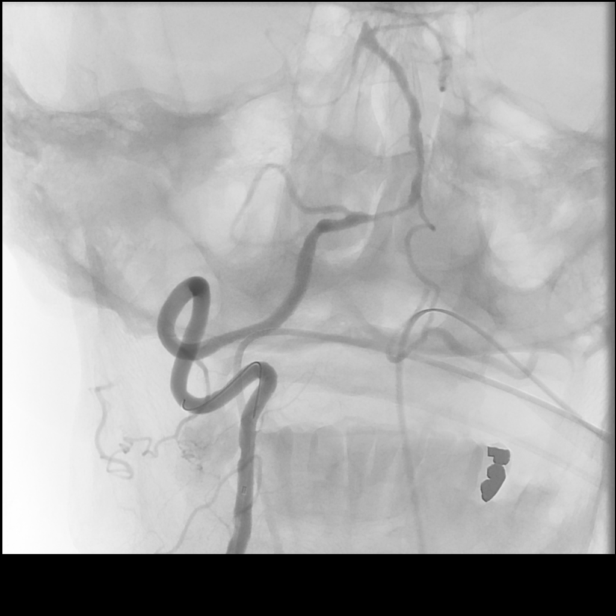

[Series 18: cerebral · 1 of 8 slices shown (2 of 10)]
[im 1/8]
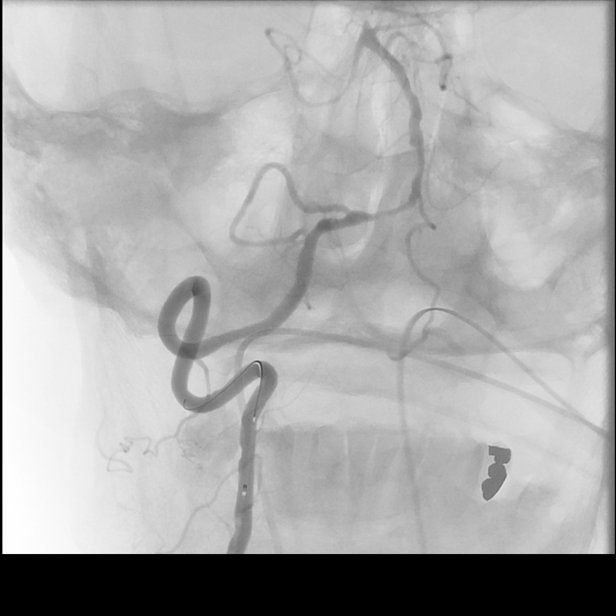

[Series 20: cerebral · 1 of 7 slices shown (3 of 10)]
[im 1/7]
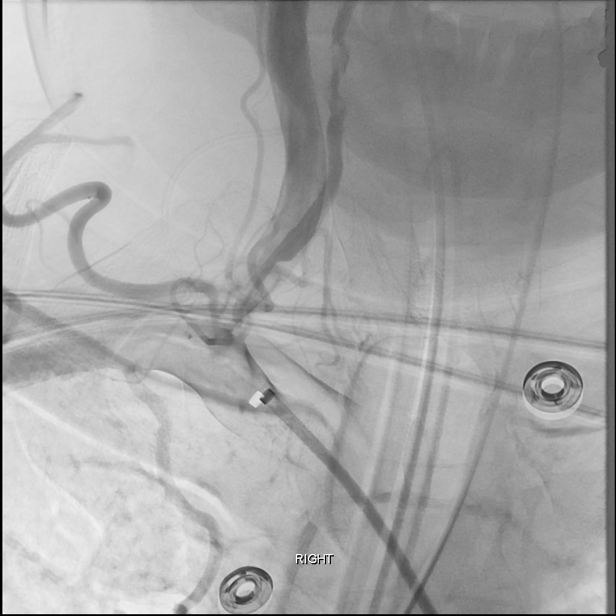

[Series 20: cerebral · 1 of 7 slices shown (4 of 10)]
[im 1/7]
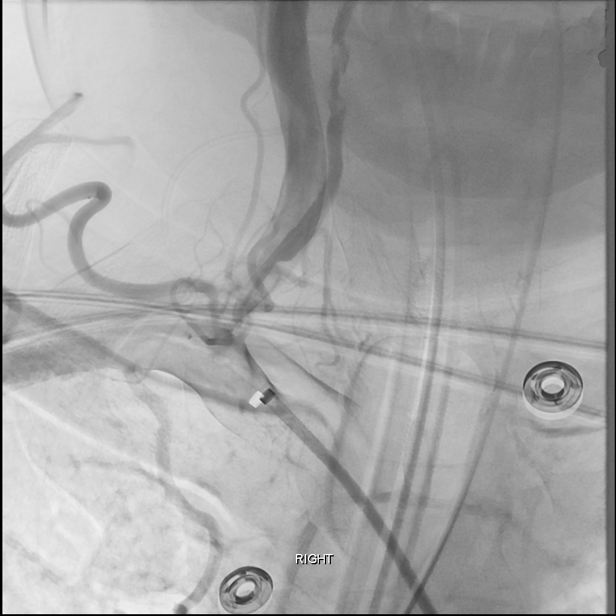

[Series 22: cerebral · 1 of 6 slices shown (5 of 10)]
[im 1/6]
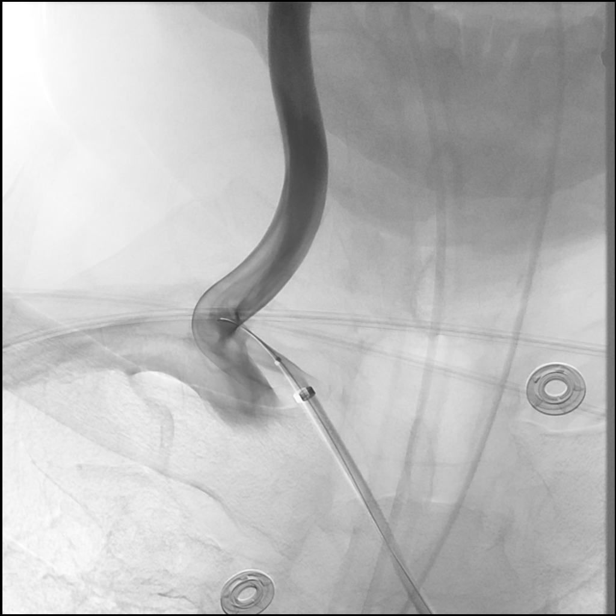

[Series 22: cerebral · 1 of 6 slices shown (6 of 10)]
[im 1/6]
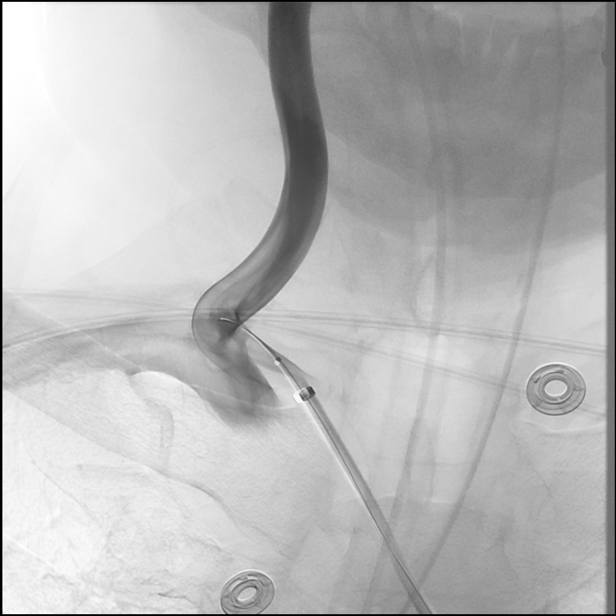

[Series 24: cerebral · 1 of 10 slices shown (7 of 10)]
[im 1/10]
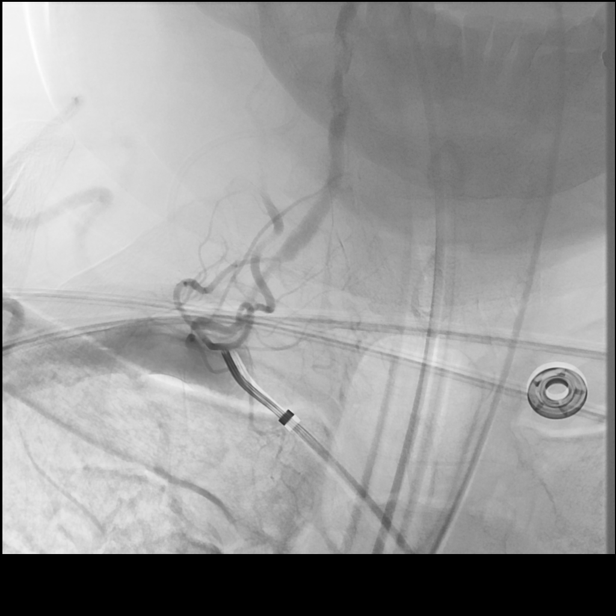

[Series 25: cerebral · 1 of 9 slices shown (8 of 10)]
[im 1/9]
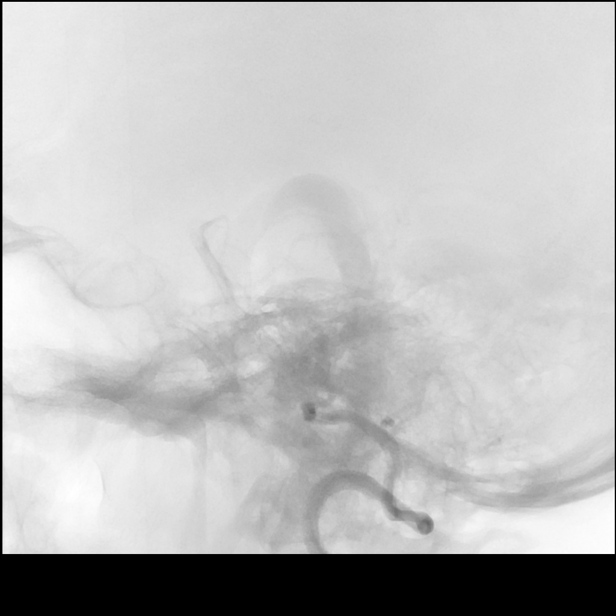

[Series 25: cerebral · 1 of 9 slices shown (9 of 10)]
[im 1/9]
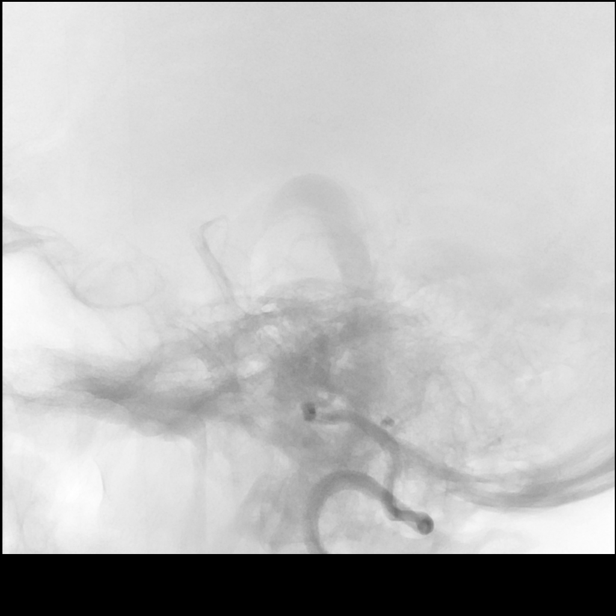

[Series 27: cerebral · 1 of 8 slices shown (10 of 10)]
[im 1/8]
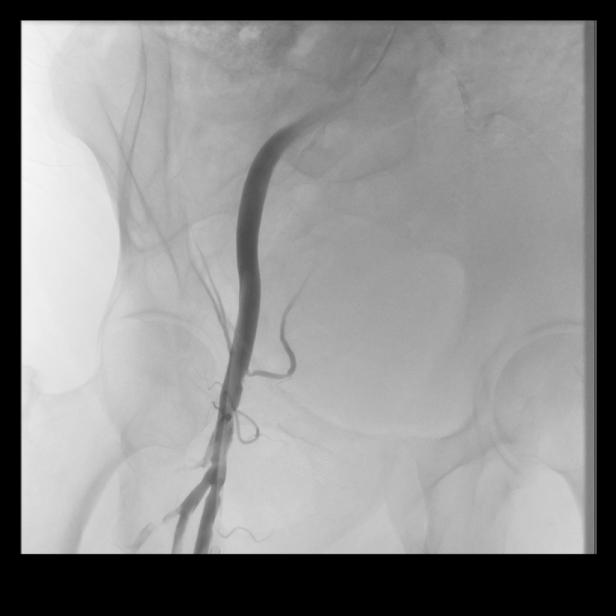

[10 of 24 positions shown; findings below may reference images not displayed]

MEDICATIONS:
Ancef 2 g IV antibiotic was administered within 1 hour of the
procedure.

ANESTHESIA/SEDATION:
General anesthesia.

CONTRAST:  Isovue 300 approximately 150 mL.

FLUOROSCOPY TIME:  Fluoroscopy Time: 65 minutes 18 seconds ([R9]
mGy).

COMPLICATIONS:
None immediate.
The patient was then put under general anesthesia by the [REDACTED] at [HOSPITAL].

The right groin was prepped and draped in the usual sterile fashion.
Thereafter using modified Seldinger technique, transfemoral access
into the right common femoral artery was obtained without
difficulty. Over a 0.035 inch guidewire a 5 French Pinnacle sheath
was inserted. Through this, and also over a 0.035 inch guidewire a 5
French JB 1 catheter was advanced to the aortic arch region and
selectively positioned in the right subclavian artery and the right
common carotid artery.
FINDINGS: The right subclavian arteriogram demonstrates approximately 50%
stenosis of the proximal [DATE] of the right vertebral artery.

A selective right vertebral artery angiogram demonstrates the 50%
right proximal vertebral artery stenosis with slow ascent of
contrast to the cranial skull base. Severe near occlusive stenosis
is seen at the mid cervical right vertebral artery level.

Distal to this there is slow ascent of contrast to the right
vertebral artery at the cranial skull base. There is slow
advancement of contrast in an antegrade fashion to the right
vertebrobasilar junction and the proximal basilar artery.

Poor filling of contrast is noted in the proximal basilar artery and
the distal basilar artery.

The right common carotid arteriogram demonstrates the right external
carotid artery and its major branches to be widely patent.

The right internal carotid artery at the bulb to the cranial skull
base demonstrates a tortuous unimpeded course.

The petrous, the cavernous and the supraclinoid segments are widely
patent.

There is mild to moderate stenosis of the petrous cavernous
junction.

A right posterior communicating artery is seen opacifying the right
posterior cerebral distribution.

The right middle cerebral artery and the right anterior cerebral
artery opacify into the capillary and venous phases.

PROCEDURE:
The diagnostic JB 1 catheter in the distal right subclavian artery
was then exchanged for a 0.035 inch 300 cm Rosen exchange guidewire
for an 8 French Pinnacle sheath in the right groin and was connected
to continuous heparinized saline infusion.

Over the Rosen exchange guidewire, a 80 cm Neuron 7 guide catheter
was advanced and positioned just proximal to the right vertebral
artery origin.

The exchange guidewire was removed. Good aspiration obtained from
the hub of the Neuron guide catheter.

Over a 0.014 inch standard Synchro micro guidewire, an 021 Phenom
microcatheter was advanced to just distal to the tip of the Neuron
guide catheter.

Using a torque device, after multiple manipulations with micro
guidewire tip, access was obtained into the right vertebral artery
followed by the microcatheter to the distal right vertebral artery.

The guidewire was removed. Good aspiration was obtained from the hub
of the microcatheter. This in turn was then exchanged for a 014 inch
Softip standard BMW exchange micro guidewire. The micro guidewire
tip had a J configuration to avoid dissections or inducing spasm.
The microcatheter was retrieved and removed. A control arteriogram
performed through the Neuron guide catheter at the origin of the
right vertebral artery demonstrated the proximal 50% stenosis at the
proximal aspect of the right vertebral artery.

Again demonstrated was a severe high-grade pre occlusive segmental
stenosis of the mid right vertebral artery.

Measurements were performed of the right vertebral artery in its
most normal segment. It was elected to proceed with balloon
angioplasty to improve revascularization at the level of the pre
occlusive mid cervical right vertebral artery stenosis.

[REDACTED] 3 mm x 15 mm angioplasty balloon catheter was prepped and
draped in the usual fashion.

Using the over-the-wire technique, this was then advanced and
positioned with the distal and the proximal markers adequate
distance from the site of severe stenosis.

Control inflation was then performed to cover the proximal and
distal portions of the long segment pre occlusive stenosis.

Inflations were performed to approximately 3 mm where it was
maintained for approximately 45 seconds. Upon deflation and more
proximal angioplasty followed by deflation, control arteriogram
performed through the Neuron guide catheter at the origin of the
right vertebral artery demonstrated significantly improved caliber
and flow hemodynamically to the right vertebrobasilar junction.
There was now much clearer angiographic opacification of the right
vertebrobasilar junction, the proximal basilar artery and also the
distal basilar artery.

High-grade stenosis was identified of the right vertebrobasilar
junction just distal to the right posterior-inferior cerebellar
artery, and also the proximal basilar artery.

There was now opacification also noted of the superior cerebellar
arteries and also the anterior-inferior cerebellar arteries
bilaterally.

Also demonstrated was retrograde opacification of the left
vertebrobasilar junction to the left posterior-inferior cerebellar
artery.

Also 70% degree stenosis was identified in the right vertebrobasilar
junction distal to the right posterior-inferior cerebellar artery,
and also the proximal basilar artery. There continued to be
excellent flow unimpeded, however, through this to the distal
basilar artery.

Partial filling of the posterior cerebral arteries at the origin was
also noted.

Four attempts were made to access the distal right vertebrobasilar
junction and proximal basilar artery as areas of approximately 70%
narrowing for balloon angioplasty with a 1.5 mm x 15 mm [REDACTED]
balloon catheter.

However, the balloon angioplasty microcatheter was not long enough
to reach the two areas mentioned. It was also partially complicated
by the inability to advance the Neuron guide catheter into the
distal vertebral artery on account of the stenosis in the proximal
right vertebral artery.

This was despite using multiple different wires and micro catheters
for support. Given that there was significant flow to the right
vertebrobasilar junction and hypoplastic vertebrobasilar system on
developmental basis by balloon angioplasty of the near occlusive mid
cervical segment of the right vertebral artery it was decided to
stop.

The exchange micro guidewire was removed. A final control
arteriogram performed through the Neuron guide catheter at the
origin of the right vertebral artery continued to demonstrate
excellent flow proximally and also distally.

The patient was given a loading dose of aspirin of 81 mg, and
Brilinta 180 mg prior to the balloon angioplasty. Also the patient
was given a total of [R9] units of IV heparin.

The Neuron guide catheter was removed. The 8 French Pinnacle sheath
was then removed with hemostasis achieved with manual compression
over about 30 minutes.

Distal pulses remained Dopplerable in the posterior tibial, and the
dorsalis pedis arteries bilaterally.

Patient's CT scan obtained of the brain demonstrated no evidence of
hemorrhage, or mass effect or midline shift.

The patient was then extubated. He was slow in recovering but
eventually was able to open his eyes to command. He was breathing
adequately on his own. However, there was minimal movement in his
upper and lower extremities.

He was then transferred to the neuro ICU for post revascularization
management.
IMPRESSION: Status post endovascular revascularization of near occlusive long
segment stenosis of the mid [DATE] of the right vertebral artery with
significantly improved caliber and flow through angioplastied vessel
into the basilar artery and the right vertebrobasilar junction.

PLAN:
Close follow-up with potential for proximal basilar artery and right
vertebral artery angioplasty at a future time probably via the right
radial artery route.

## 2019-04-10 IMAGING — XA IR CARO CERE HEAD/NECK UNILAT RIGHT (MS)
10 of 24 series · 10 of 24 positions shown · IV contrast (IODINE)
Comparison: An MRI MRA of the brain [DATE].

INDICATION: Worsening neurological function with bilateral upper and lower
extremity weakness left greater than right, and also severe
dysarthria.

EXAM:
1. EMERGENT LARGE VESSEL OCCLUSION THROMBOLYSIS (POSTERIOR
CIRCULATION)
TECHNIQUE: Following a full explanation of the procedure along with the
potential associated complications, an informed witnessed consent
was obtained patient's spouse. The risks of intracranial hemorrhage
of 10%, worsening neurological deficit, ventilator dependency, death
and inability to revascularize were all reviewed in detail with the
patient's spouse.

[Series 2: cerebral · 1 of 18 slices shown (1 of 9)]
[im 1/18]
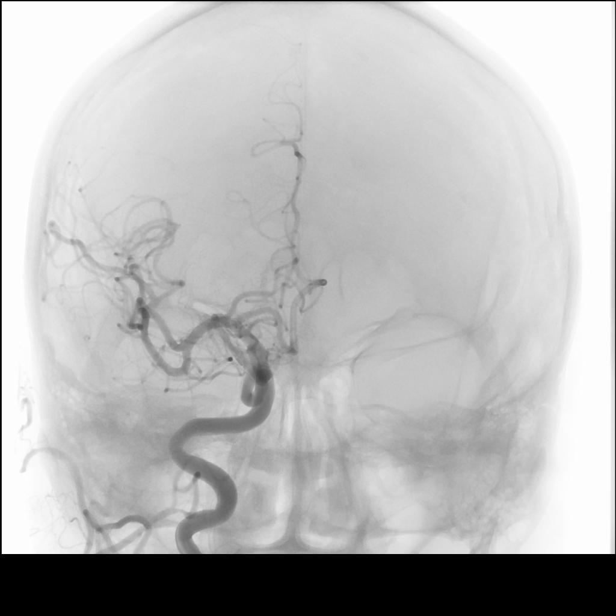

[Series 5: cerebral · 1 of 8 slices shown (2 of 9)]
[im 1/8]
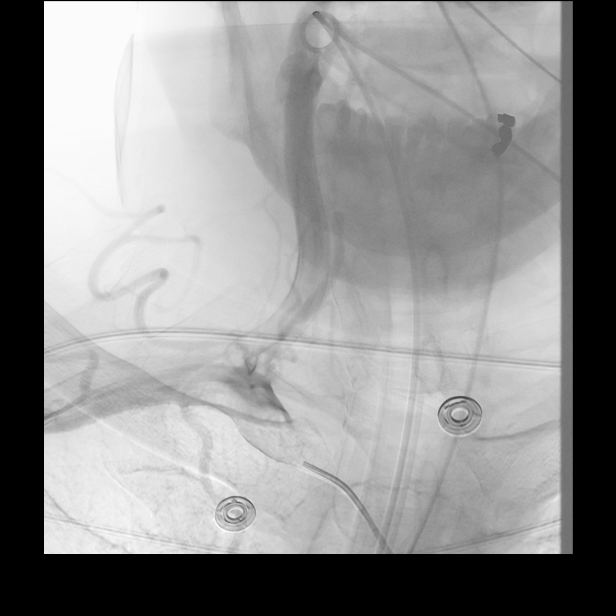

[Series 9: cerebral · 1 of 8 slices shown (3 of 9)]
[im 1/8]
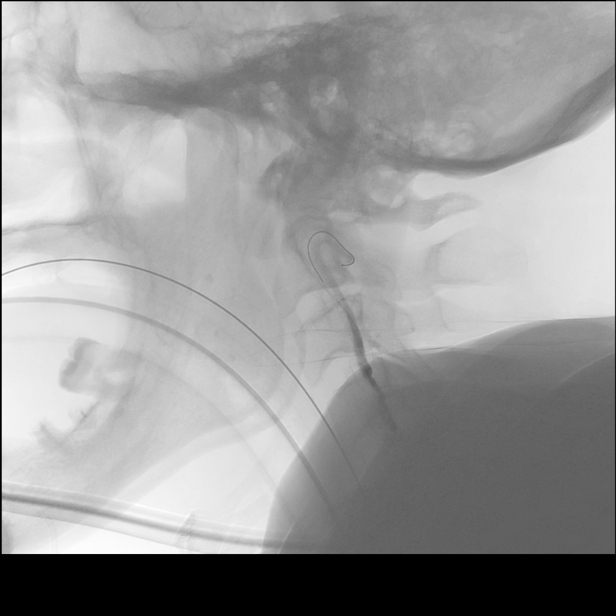

[Series 11: cerebral · 1 of 5 slices shown (4 of 9)]
[im 1/5]
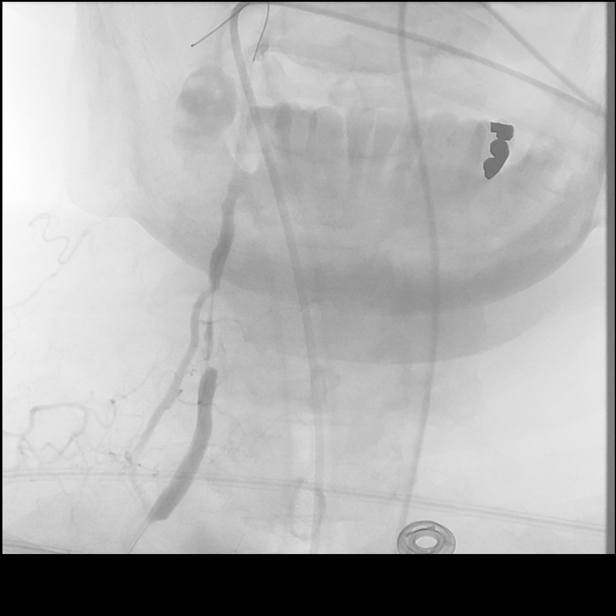

[Series 13: cerebral · 1 of 8 slices shown (5 of 9)]
[im 1/8]
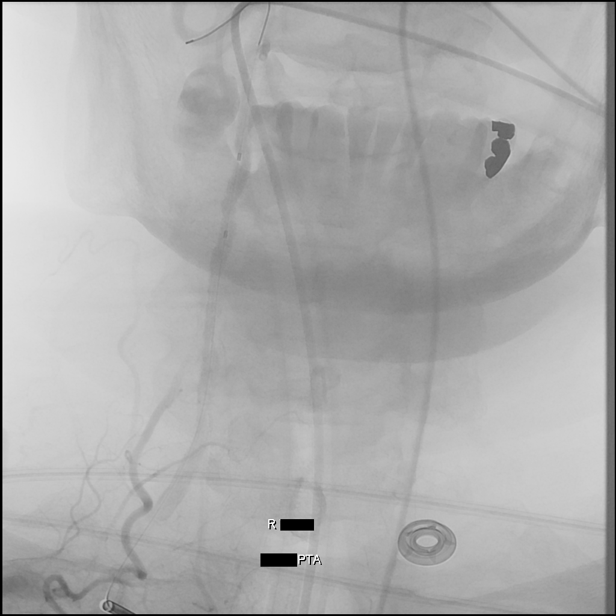

[Series 16: cerebral · 1 of 8 slices shown (6 of 9)]
[im 1/8]
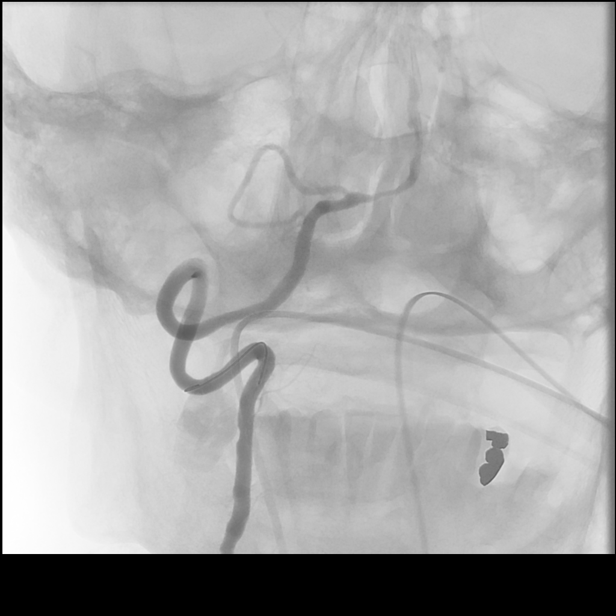

[Series 18: cerebral · 1 of 8 slices shown (7 of 9)]
[im 1/8]
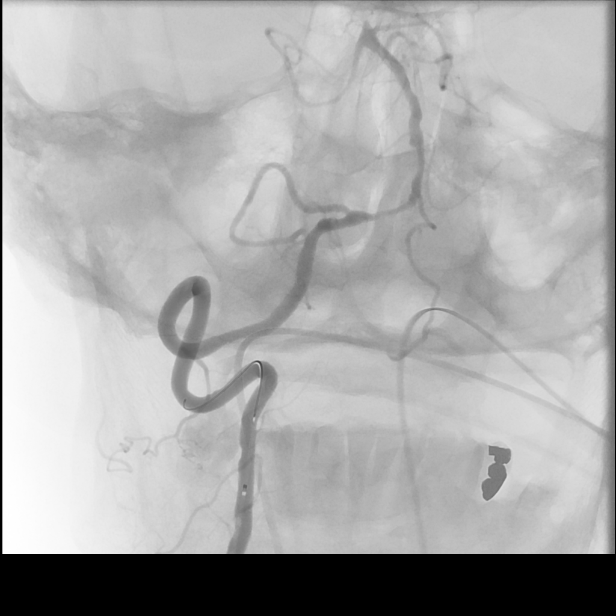

[Series 24: cerebral · 1 of 10 slices shown (8 of 9)]
[im 1/10]
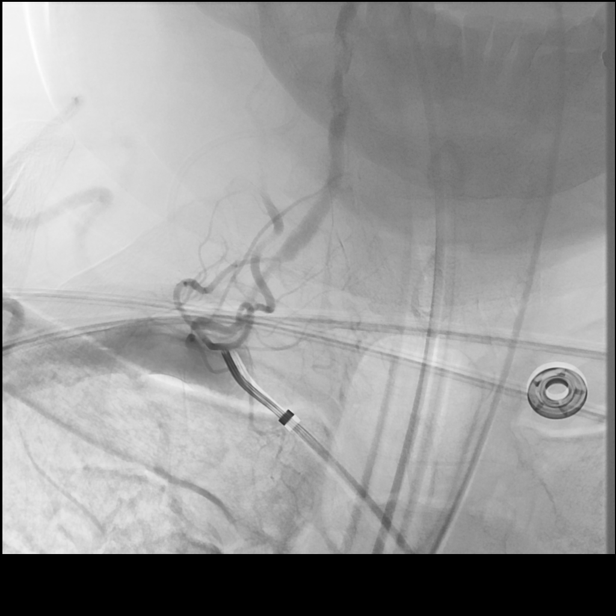

[Series 27: cerebral · 1 of 8 slices shown (9 of 9)]
[im 1/8]
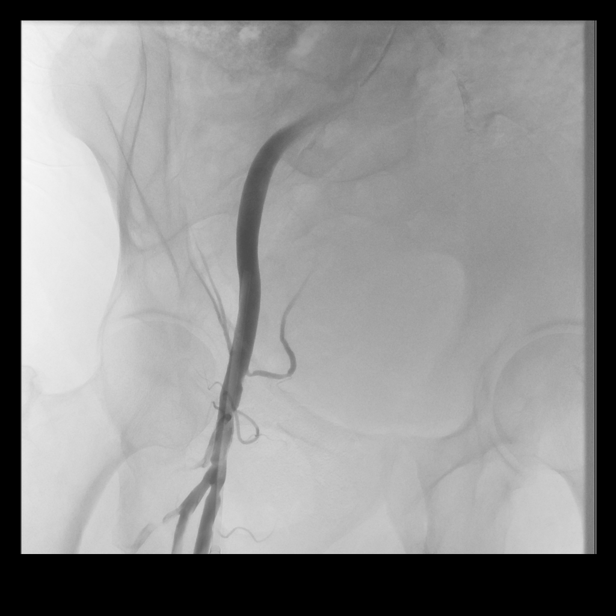

[Series 28: <mpr range>3 · axial · 0.5mm · 0.40mm/px · 1 of 34 slices shown]
[im 1/34]
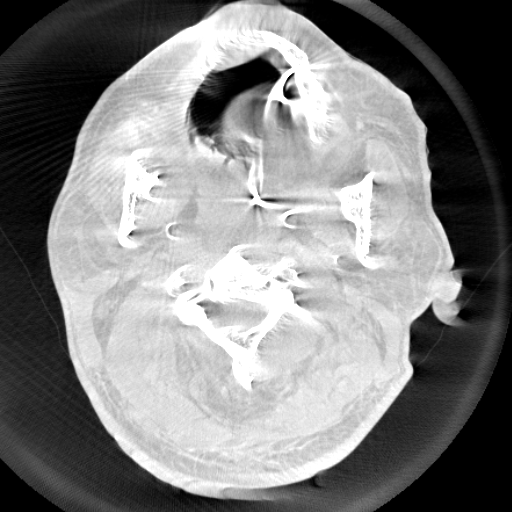

[10 of 24 positions shown; findings below may reference images not displayed]

MEDICATIONS:
Ancef 2 g IV antibiotic was administered within 1 hour of the
procedure.

ANESTHESIA/SEDATION:
General anesthesia.

CONTRAST:  Isovue 300 approximately 150 mL.

FLUOROSCOPY TIME:  Fluoroscopy Time: 65 minutes 18 seconds ([R9]
mGy).

COMPLICATIONS:
None immediate.
The patient was then put under general anesthesia by the [REDACTED] at [HOSPITAL].

The right groin was prepped and draped in the usual sterile fashion.
Thereafter using modified Seldinger technique, transfemoral access
into the right common femoral artery was obtained without
difficulty. Over a 0.035 inch guidewire a 5 French Pinnacle sheath
was inserted. Through this, and also over a 0.035 inch guidewire a 5
French JB 1 catheter was advanced to the aortic arch region and
selectively positioned in the right subclavian artery and the right
common carotid artery.
FINDINGS: The right subclavian arteriogram demonstrates approximately 50%
stenosis of the proximal [DATE] of the right vertebral artery.

A selective right vertebral artery angiogram demonstrates the 50%
right proximal vertebral artery stenosis with slow ascent of
contrast to the cranial skull base. Severe near occlusive stenosis
is seen at the mid cervical right vertebral artery level.

Distal to this there is slow ascent of contrast to the right
vertebral artery at the cranial skull base. There is slow
advancement of contrast in an antegrade fashion to the right
vertebrobasilar junction and the proximal basilar artery.

Poor filling of contrast is noted in the proximal basilar artery and
the distal basilar artery.

The right common carotid arteriogram demonstrates the right external
carotid artery and its major branches to be widely patent.

The right internal carotid artery at the bulb to the cranial skull
base demonstrates a tortuous unimpeded course.

The petrous, the cavernous and the supraclinoid segments are widely
patent.

There is mild to moderate stenosis of the petrous cavernous
junction.

A right posterior communicating artery is seen opacifying the right
posterior cerebral distribution.

The right middle cerebral artery and the right anterior cerebral
artery opacify into the capillary and venous phases.

PROCEDURE:
The diagnostic JB 1 catheter in the distal right subclavian artery
was then exchanged for a 0.035 inch 300 cm Rosen exchange guidewire
for an 8 French Pinnacle sheath in the right groin and was connected
to continuous heparinized saline infusion.

Over the Rosen exchange guidewire, a 80 cm Neuron 7 guide catheter
was advanced and positioned just proximal to the right vertebral
artery origin.

The exchange guidewire was removed. Good aspiration obtained from
the hub of the Neuron guide catheter.

Over a 0.014 inch standard Synchro micro guidewire, an 021 Phenom
microcatheter was advanced to just distal to the tip of the Neuron
guide catheter.

Using a torque device, after multiple manipulations with micro
guidewire tip, access was obtained into the right vertebral artery
followed by the microcatheter to the distal right vertebral artery.

The guidewire was removed. Good aspiration was obtained from the hub
of the microcatheter. This in turn was then exchanged for a 014 inch
Softip standard BMW exchange micro guidewire. The micro guidewire
tip had a J configuration to avoid dissections or inducing spasm.
The microcatheter was retrieved and removed. A control arteriogram
performed through the Neuron guide catheter at the origin of the
right vertebral artery demonstrated the proximal 50% stenosis at the
proximal aspect of the right vertebral artery.

Again demonstrated was a severe high-grade pre occlusive segmental
stenosis of the mid right vertebral artery.

Measurements were performed of the right vertebral artery in its
most normal segment. It was elected to proceed with balloon
angioplasty to improve revascularization at the level of the pre
occlusive mid cervical right vertebral artery stenosis.

[REDACTED] 3 mm x 15 mm angioplasty balloon catheter was prepped and
draped in the usual fashion.

Using the over-the-wire technique, this was then advanced and
positioned with the distal and the proximal markers adequate
distance from the site of severe stenosis.

Control inflation was then performed to cover the proximal and
distal portions of the long segment pre occlusive stenosis.

Inflations were performed to approximately 3 mm where it was
maintained for approximately 45 seconds. Upon deflation and more
proximal angioplasty followed by deflation, control arteriogram
performed through the Neuron guide catheter at the origin of the
right vertebral artery demonstrated significantly improved caliber
and flow hemodynamically to the right vertebrobasilar junction.
There was now much clearer angiographic opacification of the right
vertebrobasilar junction, the proximal basilar artery and also the
distal basilar artery.

High-grade stenosis was identified of the right vertebrobasilar
junction just distal to the right posterior-inferior cerebellar
artery, and also the proximal basilar artery.

There was now opacification also noted of the superior cerebellar
arteries and also the anterior-inferior cerebellar arteries
bilaterally.

Also demonstrated was retrograde opacification of the left
vertebrobasilar junction to the left posterior-inferior cerebellar
artery.

Also 70% degree stenosis was identified in the right vertebrobasilar
junction distal to the right posterior-inferior cerebellar artery,
and also the proximal basilar artery. There continued to be
excellent flow unimpeded, however, through this to the distal
basilar artery.

Partial filling of the posterior cerebral arteries at the origin was
also noted.

Four attempts were made to access the distal right vertebrobasilar
junction and proximal basilar artery as areas of approximately 70%
narrowing for balloon angioplasty with a 1.5 mm x 15 mm [REDACTED]
balloon catheter.

However, the balloon angioplasty microcatheter was not long enough
to reach the two areas mentioned. It was also partially complicated
by the inability to advance the Neuron guide catheter into the
distal vertebral artery on account of the stenosis in the proximal
right vertebral artery.

This was despite using multiple different wires and micro catheters
for support. Given that there was significant flow to the right
vertebrobasilar junction and hypoplastic vertebrobasilar system on
developmental basis by balloon angioplasty of the near occlusive mid
cervical segment of the right vertebral artery it was decided to
stop.

The exchange micro guidewire was removed. A final control
arteriogram performed through the Neuron guide catheter at the
origin of the right vertebral artery continued to demonstrate
excellent flow proximally and also distally.

The patient was given a loading dose of aspirin of 81 mg, and
Brilinta 180 mg prior to the balloon angioplasty. Also the patient
was given a total of [R9] units of IV heparin.

The Neuron guide catheter was removed. The 8 French Pinnacle sheath
was then removed with hemostasis achieved with manual compression
over about 30 minutes.

Distal pulses remained Dopplerable in the posterior tibial, and the
dorsalis pedis arteries bilaterally.

Patient's CT scan obtained of the brain demonstrated no evidence of
hemorrhage, or mass effect or midline shift.

The patient was then extubated. He was slow in recovering but
eventually was able to open his eyes to command. He was breathing
adequately on his own. However, there was minimal movement in his
upper and lower extremities.

He was then transferred to the neuro ICU for post revascularization
management.
IMPRESSION: Status post endovascular revascularization of near occlusive long
segment stenosis of the mid [DATE] of the right vertebral artery with
significantly improved caliber and flow through angioplastied vessel
into the basilar artery and the right vertebrobasilar junction.

PLAN:
Close follow-up with potential for proximal basilar artery and right
vertebral artery angioplasty at a future time probably via the right
radial artery route.

## 2019-04-10 IMAGING — MR MR MRA HEAD W/O CM
1 series · 22 of 48 positions shown · non-contrast
Comparison: Brain MRI from earlier today

CLINICAL DATA: Speech difficulty

EXAM:
MRA HEAD WITHOUT CONTRAST
TECHNIQUE: Angiographic images of the Circle of Willis were obtained using MRA
technique without intravenous contrast.

[Series 18: 3d cow · axial · 0.5mm · 0.44mm/px · z∈[-96,-15]mm · 22 of 172 slices shown]
[im 1/172]
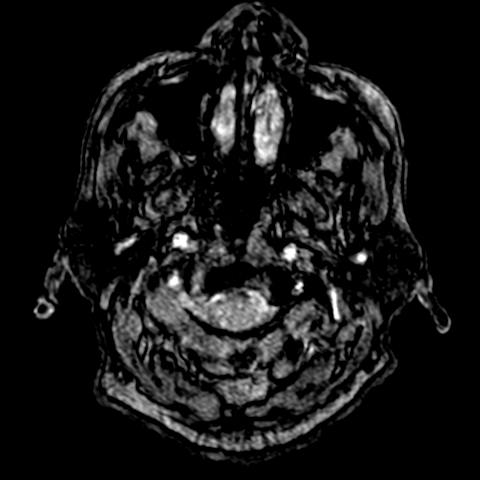
[im 4/172]
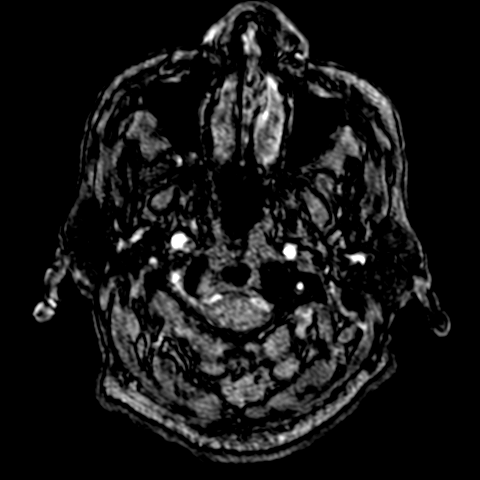
[im 8/172]
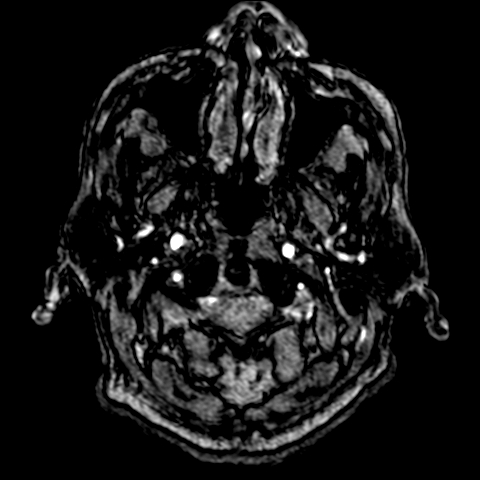
[im 11/172]
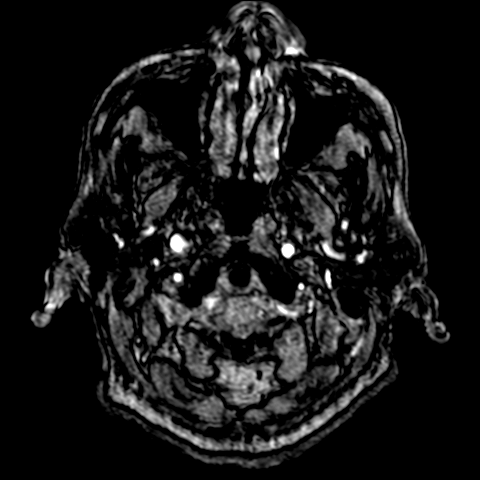
[im 15/172]
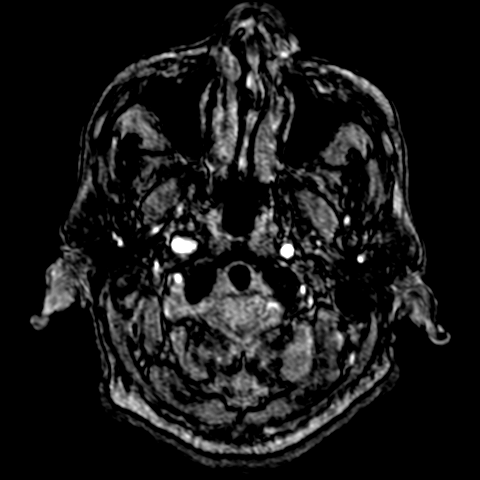
[im 19/172]
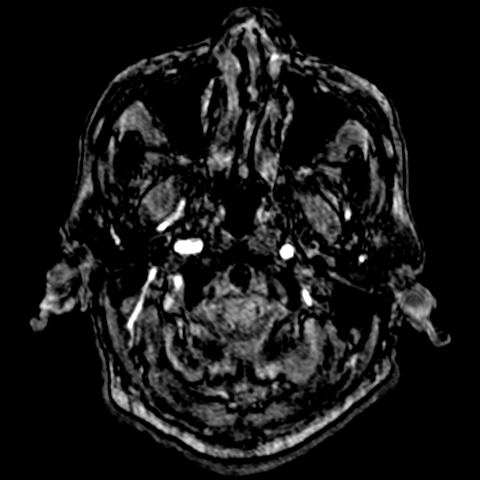
[im 22/172]
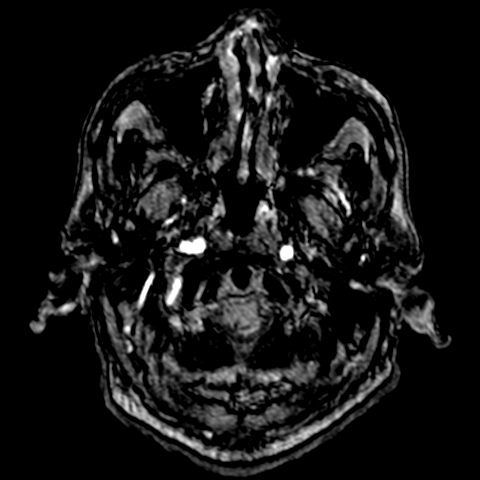
[im 26/172]
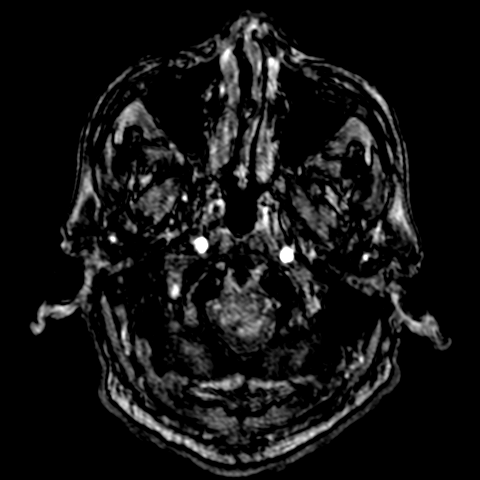
[im 30/172]
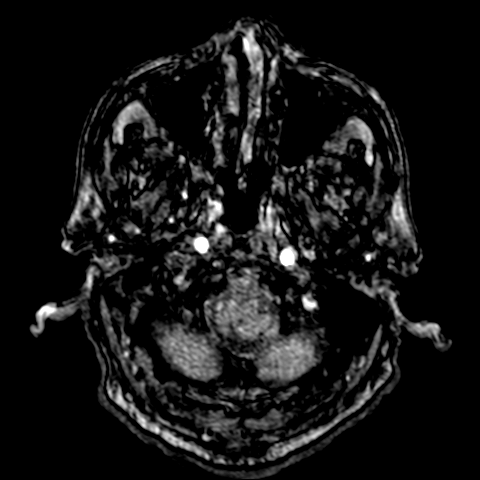
[im 33/172]
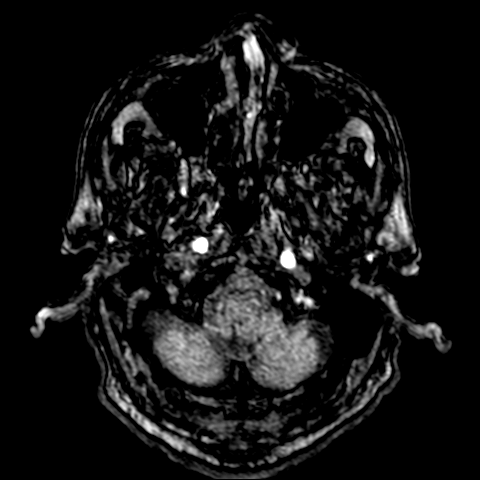
[im 37/172]
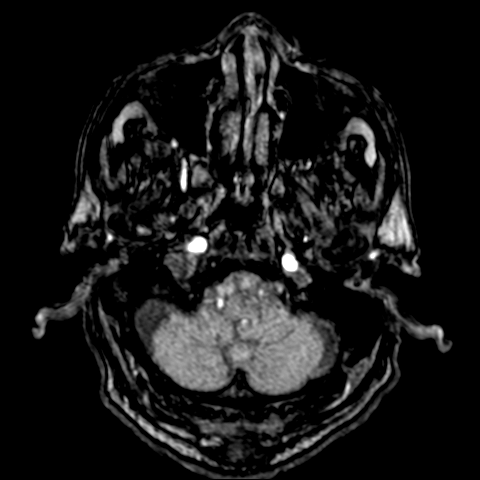
[im 41/172]
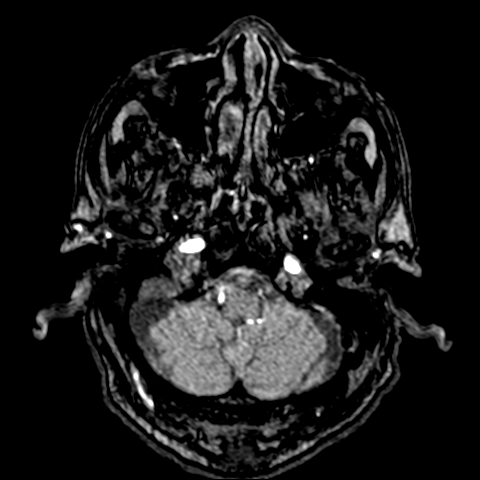
[im 44/172]
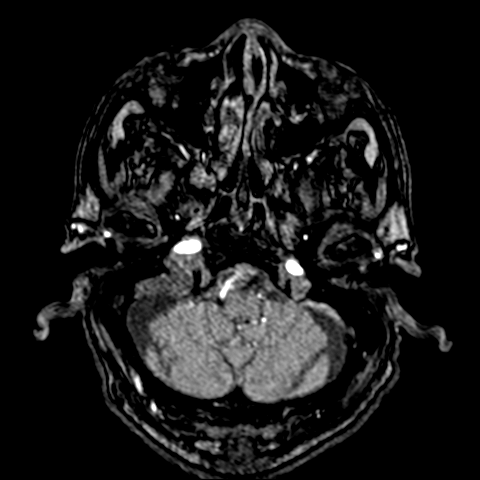
[im 48/172]
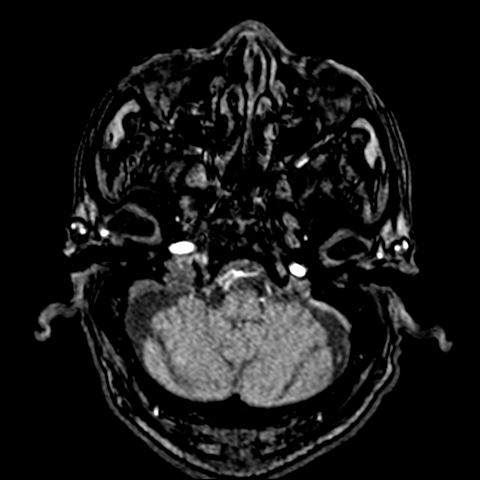
[im 55/172]
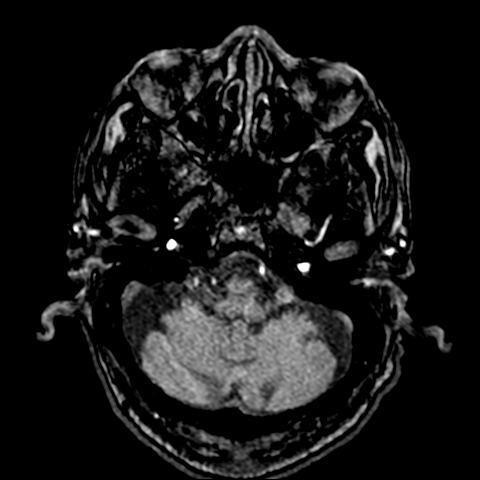
[im 77/172]
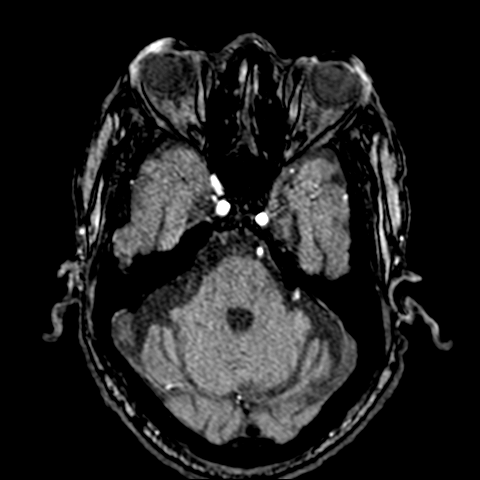
[im 88/172]
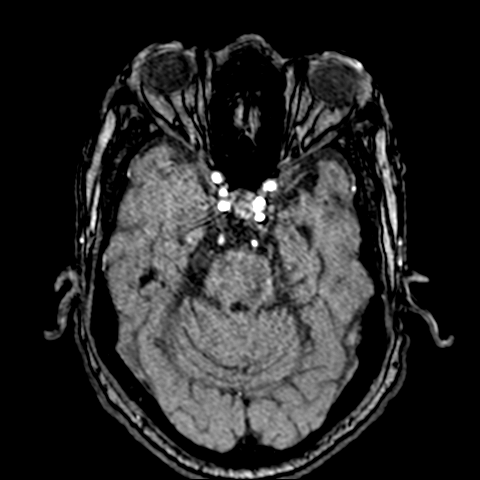
[im 99/172]
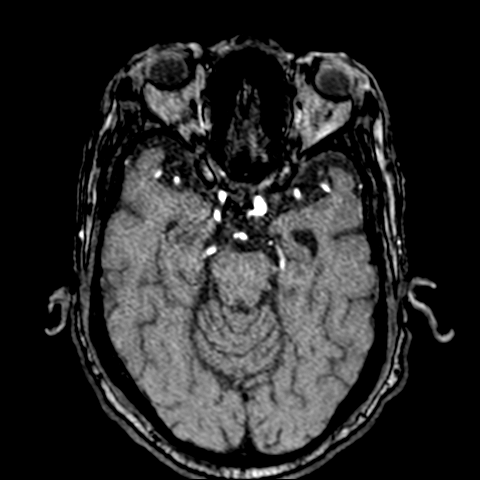
[im 121/172]
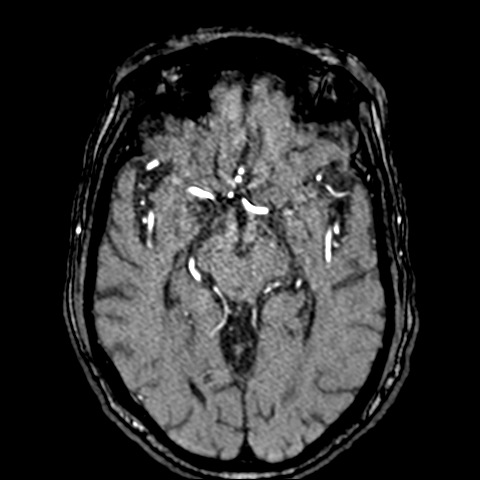
[im 142/172]
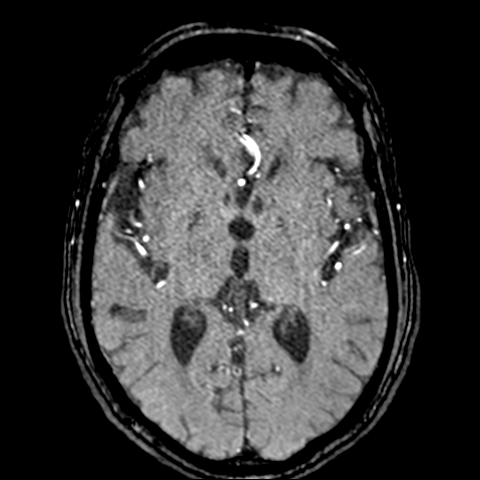
[im 146/172]
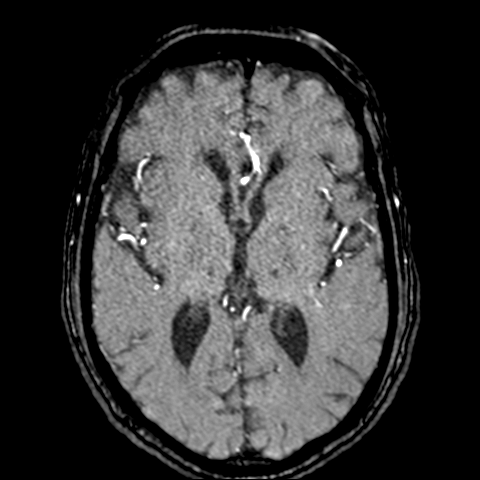
[im 164/172]
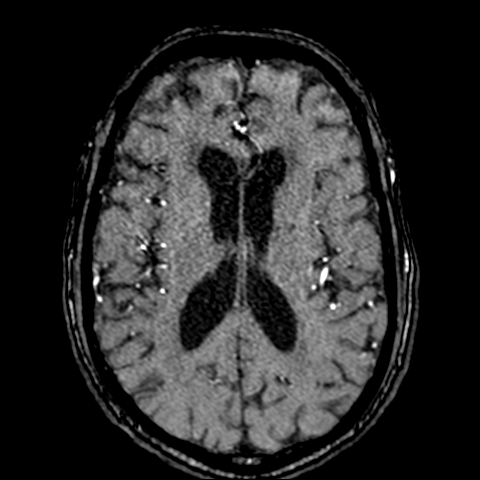

[22 of 48 positions shown; findings below may reference images not displayed]

FINDINGS: Dominant right V4 segment with high-grade narrowing past the PICA.
Much of left vertebral flow is into the left PICA which is patent on
source images. Moderate to advanced proximal basilar stenosis. Fetal
type flow in both posterior cerebral arteries. The left upper PCA
branch is highly stenotic. Mild narrowing at the left P3 segment.

Atherosclerotic irregularity along both carotid siphons with
subjective moderate narrowing at the paraclinoid segment on the
left. No branch occlusion, beading, or aneurysm.
IMPRESSION: Intracranial atherosclerosis most notable in the posterior
circulation where there is high-grade stenosis of the dominant right
V4 segment and moderate to advanced narrowing of the proximal
basilar.

## 2019-04-10 IMAGING — MR MR HEAD W/O CM
12 of 13 series · 44 of 48 positions shown · non-contrast
Comparison: Head CT from yesterday

CLINICAL DATA: Speech difficulty.

EXAM:
MRI HEAD WITHOUT CONTRAST
TECHNIQUE: Multiplanar, multiecho pulse sequences of the brain and surrounding
structures were obtained without intravenous contrast.

[Series 5: DWI · axial · 3.0mm · 0.88mm/px · z∈[-100,+40]mm · 7 of 96 slices shown (1 of 4)]
[im 1/96]
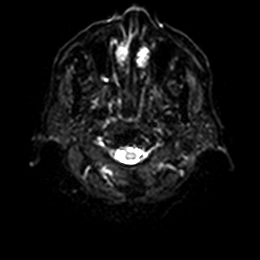
[im 16/96]
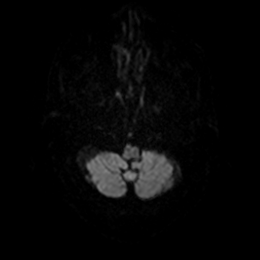
[im 32/96]
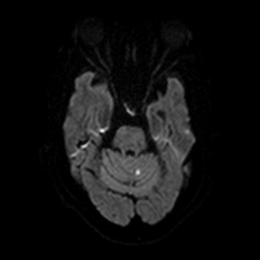
[im 48/96]
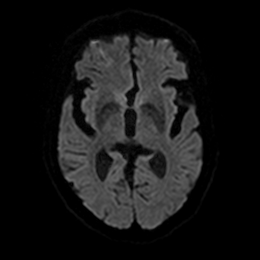
[im 64/96]
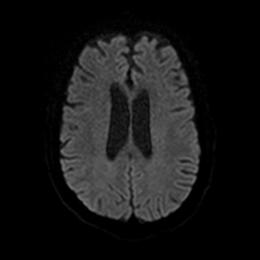
[im 80/96]
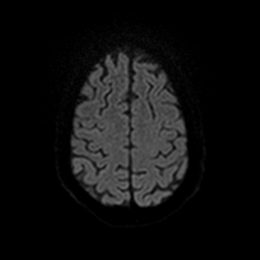
[im 96/96]
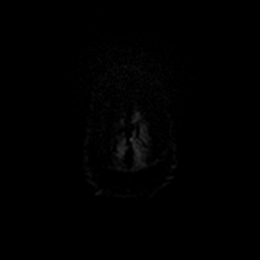

[Series 6: DWI · axial · 3.0mm · 0.88mm/px · z∈[-100,+40]mm · 4 of 48 slices shown (2 of 4)]
[im 1/48]
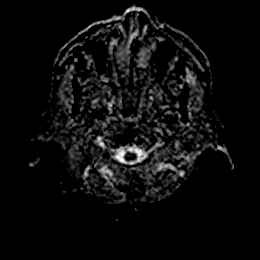
[im 16/48]
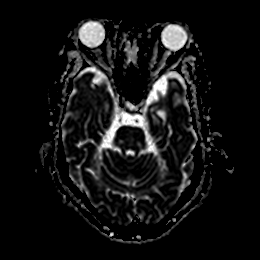
[im 32/48]
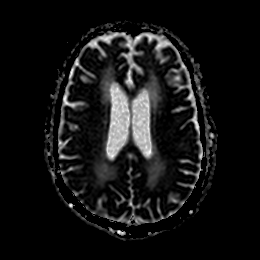
[im 48/48]
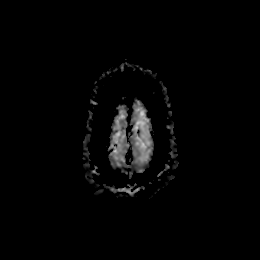

[Series 7: DWI · coronal · 4.0mm · 0.88mm/px · 6 of 74 slices shown (3 of 4)]
[im 1/74]
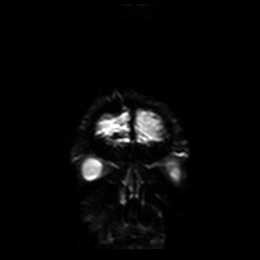
[im 15/74]
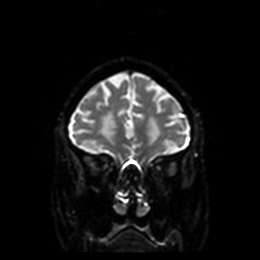
[im 30/74]
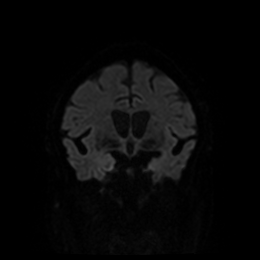
[im 44/74]
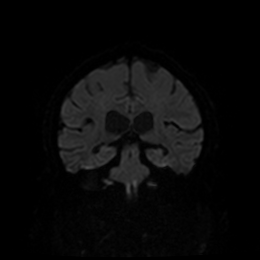
[im 59/74]
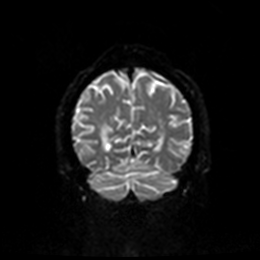
[im 74/74]
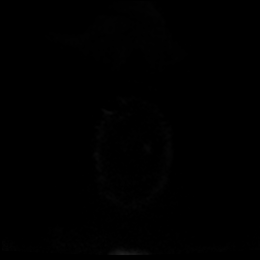

[Series 8: DWI · coronal · 4.0mm · 0.88mm/px · 3 of 37 slices shown (4 of 4)]
[im 1/37]
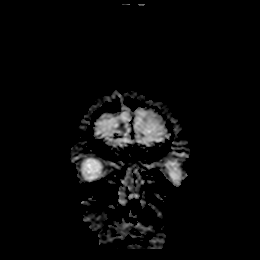
[im 19/37]
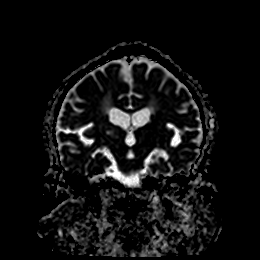
[im 37/37]
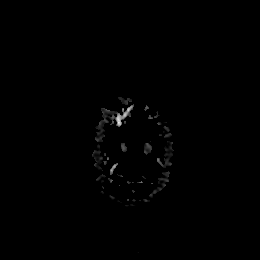

[Series 9: T1 · sagittal · 5.0mm · 0.75mm/px · 2 of 23 slices shown]
[im 1/23]
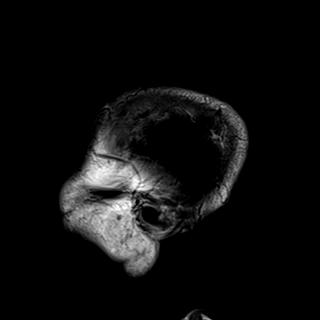
[im 23/23]
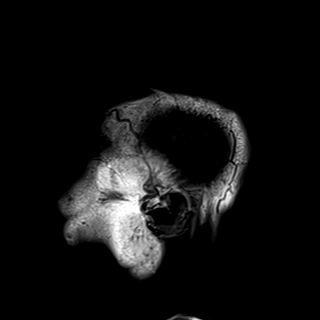

[Series 10: T2 · axial · 5.0mm · 0.72mm/px · z∈[-101,+43]mm · 2 of 25 slices shown (1 of 2)]
[im 1/25]
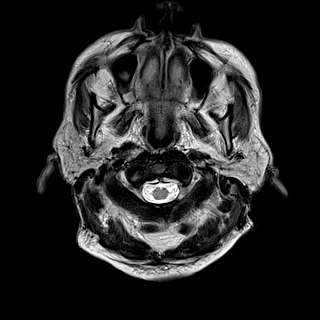
[im 25/25]
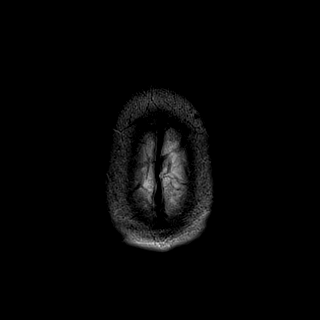

[Series 11: FLAIR · axial · 5.0mm · 0.45mm/px · z∈[-100,+43]mm · 2 of 25 slices shown]
[im 1/25]
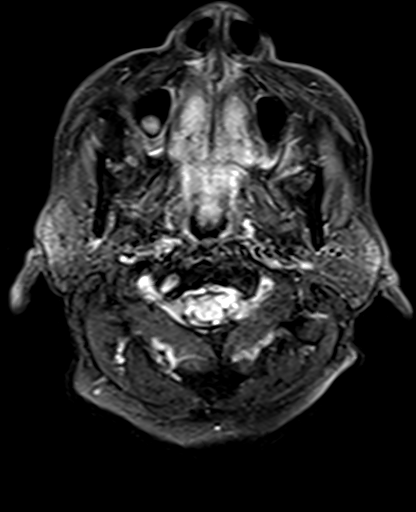
[im 25/25]
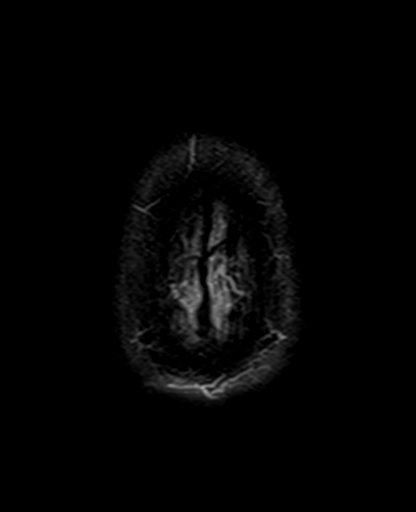

[Series 12: mag_images · axial · 3.0mm · 0.90mm/px · z∈[-109,+68]mm · 4 of 60 slices shown]
[im 1/60]
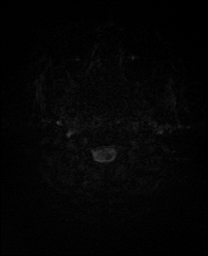
[im 20/60]
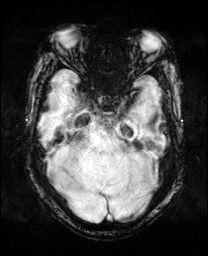
[im 40/60]
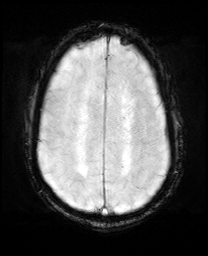
[im 60/60]
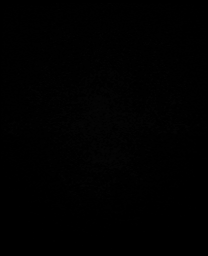

[Series 13: pha_images · axial · 3.0mm · 0.90mm/px · z∈[-106,+56]mm · 4 of 54 slices shown]
[im 1/54]
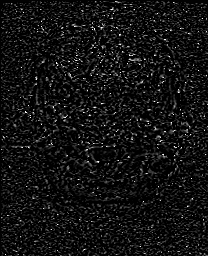
[im 18/54]
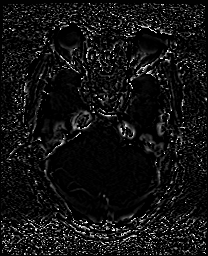
[im 36/54]
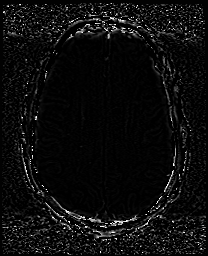
[im 54/54]
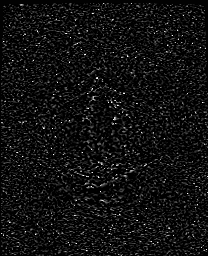

[Series 14: swi_images · axial · 3.0mm · 0.90mm/px · z∈[-109,+68]mm · 4 of 60 slices shown]
[im 1/60]
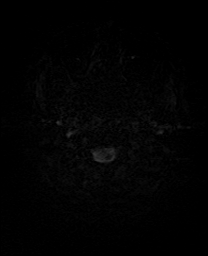
[im 20/60]
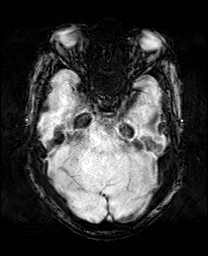
[im 40/60]
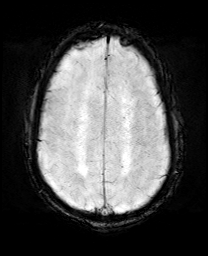
[im 60/60]
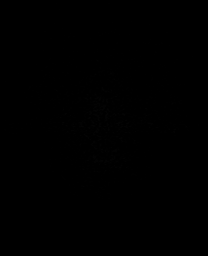

[Series 15: mip_images(sw) · axial · 24.0mm · 0.90mm/px · z∈[-98,+57]mm · 4 of 53 slices shown]
[im 1/53]
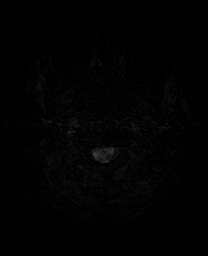
[im 18/53]
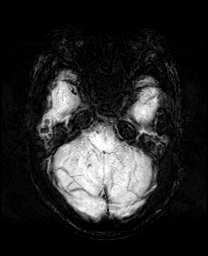
[im 35/53]
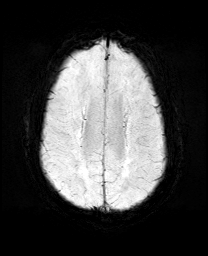
[im 53/53]
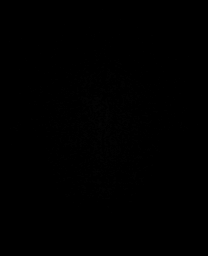

[Series 17: T2 · coronal · 5.0mm · 0.34mm/px · 2 of 30 slices shown (2 of 2)]
[im 1/30]
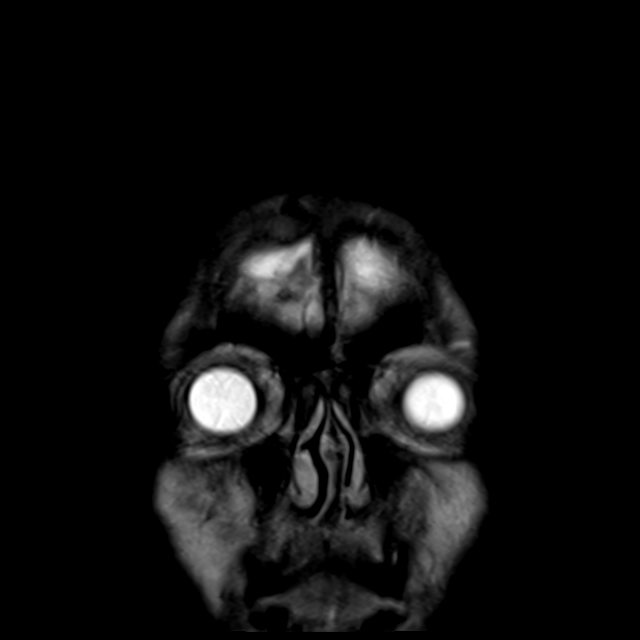
[im 30/30]
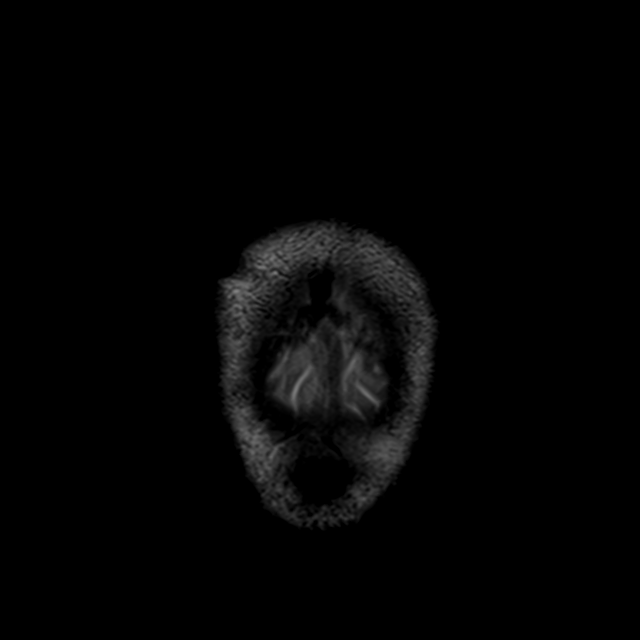

[44 of 48 positions shown; findings below may reference images not displayed]

FINDINGS: Brain: Restricted diffusion on both sides of the ventral medulla,
correlating with history of weakness. Although it is unusual for
brainstem infarcts to be bilateral, presence of small left superior
cerebellar and right posterior temporal infarcts are compatible with
multifocal infarct. There is a background of chronic small vessel
ischemia with remote infarcts in the bilateral cerebellum and
brainstem and in the bilateral deep gray nuclei, especially
thalamus. Confluent chronic small vessel ischemic gliosis in the
deep cerebral white matter. Age normal brain volume. Chronic blood
products in the right cerebellum considered post ischemic. No
generalized chronic hemorrhagic injury.

Vascular: Normal flow voids.

Skull and upper cervical spine: Negative for marrow lesion.

Sinuses/Orbits: Negative
IMPRESSION: 1. Small acute infarcts in the bilateral ventral medulla, left
superior cerebellum, and right perisylvian cortex.
2. Background of advanced chronic small vessel ischemia.

## 2019-04-10 SURGERY — IR WITH ANESTHESIA
Anesthesia: General

## 2019-04-10 MED ORDER — ROCURONIUM BROMIDE 10 MG/ML (PF) SYRINGE
PREFILLED_SYRINGE | INTRAVENOUS | Status: DC | PRN
  Administered 2019-04-10: 50 mg via INTRAVENOUS
  Administered 2019-04-10: 10 mg via INTRAVENOUS
  Administered 2019-04-10: 20 mg via INTRAVENOUS

## 2019-04-10 MED ORDER — ASPIRIN 300 MG RE SUPP: 300.0000 mg | Freq: Every day | RECTAL | Status: DC

## 2019-04-10 MED ORDER — NITROGLYCERIN 1 MG/10 ML FOR IR/CATH LAB
INTRA_ARTERIAL | Status: AC
  Filled 2019-04-10: qty 10

## 2019-04-10 MED ORDER — ATORVASTATIN CALCIUM 80 MG PO TABS
80.0000 mg | ORAL_TABLET | Freq: Every day | ORAL | Status: DC
  Administered 2019-04-10: 80 mg via ORAL
  Filled 2019-04-10: qty 1

## 2019-04-10 MED ORDER — HEPARIN SODIUM (PORCINE) 1000 UNIT/ML IJ SOLN
INTRAMUSCULAR | Status: DC | PRN
  Administered 2019-04-10 (×2): 1000 [IU] via INTRAVENOUS

## 2019-04-10 MED ORDER — MUPIROCIN 2 % EX OINT
TOPICAL_OINTMENT | Freq: Two times a day (BID) | CUTANEOUS | Status: DC
  Administered 2019-04-10 – 2019-04-11 (×2): via NASAL
  Administered 2019-04-11: 1 via NASAL
  Administered 2019-04-12 – 2019-04-13 (×3): via NASAL
  Filled 2019-04-10 (×2): qty 22

## 2019-04-10 MED ORDER — EPTIFIBATIDE 20 MG/10ML IV SOLN
INTRAVENOUS | Status: AC
  Filled 2019-04-10: qty 10

## 2019-04-10 MED ORDER — TICAGRELOR 90 MG PO TABS
ORAL_TABLET | ORAL | Status: AC
  Administered 2019-04-10: 180 mg via OROGASTRIC
  Filled 2019-04-10: qty 2

## 2019-04-10 MED ORDER — CEFAZOLIN SODIUM-DEXTROSE 2-3 GM-%(50ML) IV SOLR
INTRAVENOUS | Status: DC | PRN
  Administered 2019-04-10: 2 g via INTRAVENOUS

## 2019-04-10 MED ORDER — STROKE: EARLY STAGES OF RECOVERY BOOK
Freq: Once | Status: AC
  Administered 2019-04-10: 15:00:00 1
  Filled 2019-04-10: qty 1

## 2019-04-10 MED ORDER — SODIUM CHLORIDE 0.9 % IV SOLN
INTRAVENOUS | Status: DC
  Administered 2019-04-10: 16:00:00 1000 mL via INTRAVENOUS
  Administered 2019-04-10 – 2019-04-11 (×3): via INTRAVENOUS

## 2019-04-10 MED ORDER — ASPIRIN 325 MG PO TABS: 325.0000 mg | ORAL_TABLET | Freq: Every day | ORAL | Status: DC

## 2019-04-10 MED ORDER — ASPIRIN 300 MG RE SUPP
300.0000 mg | Freq: Once | RECTAL | Status: AC
  Administered 2019-04-10: 08:00:00 300 mg via RECTAL
  Filled 2019-04-10: qty 1

## 2019-04-10 MED ORDER — TIROFIBAN HCL IN NACL 5-0.9 MG/100ML-% IV SOLN
INTRAVENOUS | Status: AC
  Filled 2019-04-10: qty 100

## 2019-04-10 MED ORDER — FENTANYL CITRATE (PF) 250 MCG/5ML IJ SOLN
INTRAMUSCULAR | Status: DC | PRN
  Administered 2019-04-10 (×2): 50 ug via INTRAVENOUS

## 2019-04-10 MED ORDER — CEFAZOLIN SODIUM-DEXTROSE 2-4 GM/100ML-% IV SOLN
INTRAVENOUS | Status: AC
  Filled 2019-04-10: qty 100

## 2019-04-10 MED ORDER — FENTANYL CITRATE (PF) 100 MCG/2ML IJ SOLN
INTRAMUSCULAR | Status: AC
  Filled 2019-04-10: qty 2

## 2019-04-10 MED ORDER — CLOPIDOGREL BISULFATE 300 MG PO TABS
ORAL_TABLET | ORAL | Status: AC
  Filled 2019-04-10: qty 1

## 2019-04-10 MED ORDER — ASPIRIN 81 MG PO CHEW
CHEWABLE_TABLET | ORAL | Status: AC
  Administered 2019-04-10: 81 mg via OROGASTRIC
  Filled 2019-04-10: qty 1

## 2019-04-10 MED ORDER — LIDOCAINE 2% (20 MG/ML) 5 ML SYRINGE
INTRAMUSCULAR | Status: DC | PRN
  Administered 2019-04-10: 60 mg via INTRAVENOUS

## 2019-04-10 MED ORDER — ACETAMINOPHEN 325 MG PO TABS: 650.0000 mg | ORAL_TABLET | ORAL | Status: DC | PRN

## 2019-04-10 MED ORDER — ACETAMINOPHEN 160 MG/5ML PO SOLN: 650.0000 mg | ORAL | Status: DC | PRN

## 2019-04-10 MED ORDER — PROPOFOL 10 MG/ML IV BOLUS
INTRAVENOUS | Status: DC | PRN
  Administered 2019-04-10: 120 mg via INTRAVENOUS

## 2019-04-10 MED ORDER — HEPARIN (PORCINE) 25000 UT/250ML-% IV SOLN
900.0000 [IU]/h | INTRAVENOUS | Status: DC
  Administered 2019-04-10: 900 [IU]/h via INTRAVENOUS
  Filled 2019-04-10: qty 250

## 2019-04-10 MED ORDER — ACETAMINOPHEN 650 MG RE SUPP
650.0000 mg | RECTAL | Status: DC | PRN
  Administered 2019-04-10: 14:00:00 650 mg via RECTAL
  Filled 2019-04-10: qty 1

## 2019-04-10 MED ORDER — HYDRALAZINE HCL 20 MG/ML IJ SOLN
5.0000 mg | INTRAMUSCULAR | Status: DC | PRN
  Administered 2019-04-10: 15:00:00 5 mg via INTRAVENOUS
  Administered 2019-04-12 (×3): 10 mg via INTRAVENOUS
  Filled 2019-04-10 (×4): qty 1

## 2019-04-10 MED ORDER — FENTANYL CITRATE (PF) 250 MCG/5ML IJ SOLN: INTRAMUSCULAR | Status: DC | PRN

## 2019-04-10 MED ORDER — PHENYLEPHRINE HCL-NACL 10-0.9 MG/250ML-% IV SOLN
INTRAVENOUS | Status: DC | PRN
  Administered 2019-04-10: 25 ug/min via INTRAVENOUS

## 2019-04-10 MED ORDER — INSULIN ASPART 100 UNIT/ML ~~LOC~~ SOLN: 0.0000 [IU] | Freq: Three times a day (TID) | SUBCUTANEOUS | Status: DC

## 2019-04-10 MED ORDER — EPHEDRINE SULFATE-NACL 50-0.9 MG/10ML-% IV SOSY
PREFILLED_SYRINGE | INTRAVENOUS | Status: DC | PRN
  Administered 2019-04-10: 10 mg via INTRAVENOUS
  Administered 2019-04-10: 5 mg via INTRAVENOUS
  Administered 2019-04-11: 10 mg via INTRAVENOUS
  Administered 2019-04-11: 5 mg via INTRAVENOUS
  Administered 2019-04-11: 10 mg via INTRAVENOUS

## 2019-04-10 MED ORDER — INSULIN ASPART 100 UNIT/ML ~~LOC~~ SOLN
0.0000 [IU] | Freq: Three times a day (TID) | SUBCUTANEOUS | Status: DC
  Administered 2019-04-10: 5 [IU] via SUBCUTANEOUS

## 2019-04-10 MED ORDER — CHLORHEXIDINE GLUCONATE CLOTH 2 % EX PADS
6.0000 | MEDICATED_PAD | Freq: Every day | CUTANEOUS | Status: DC
  Administered 2019-04-12 – 2019-04-13 (×2): 6 via TOPICAL

## 2019-04-10 MED ORDER — PHENYLEPHRINE 40 MCG/ML (10ML) SYRINGE FOR IV PUSH (FOR BLOOD PRESSURE SUPPORT)
PREFILLED_SYRINGE | INTRAVENOUS | Status: DC | PRN
  Administered 2019-04-10 (×2): 80 ug via INTRAVENOUS
  Administered 2019-04-10: 40 ug via INTRAVENOUS
  Administered 2019-04-10 (×2): 80 ug via INTRAVENOUS
  Administered 2019-04-11 (×2): 40 ug via INTRAVENOUS
  Administered 2019-04-11: 80 ug via INTRAVENOUS

## 2019-04-10 MED ORDER — SUCCINYLCHOLINE CHLORIDE 200 MG/10ML IV SOSY
PREFILLED_SYRINGE | INTRAVENOUS | Status: DC | PRN
  Administered 2019-04-10: 80 mg via INTRAVENOUS

## 2019-04-10 NOTE — Anesthesia Preprocedure Evaluation (Signed)
Anesthesia Evaluation  Patient identified by MRN, date of birth, ID band Patient awake    Reviewed: Allergy & Precautions, NPO status , Patient's Chart, lab work & pertinent test resultsPreop documentation limited or incomplete due to emergent nature of procedure.  Airway Mallampati: II  TM Distance: >3 FB Neck ROM: Full    Dental  (+) Dental Advisory Given   Pulmonary former smoker,    breath sounds clear to auscultation       Cardiovascular hypertension, + Peripheral Vascular Disease and +CHF   Rhythm:Regular Rate:Normal  Normal EF. Valves okay   Neuro/Psych CVA    GI/Hepatic negative GI ROS, Neg liver ROS,   Endo/Other  diabetes  Renal/GU Renal InsufficiencyRenal disease     Musculoskeletal   Abdominal   Peds  Hematology   Anesthesia Other Findings   Reproductive/Obstetrics                             Lab Results  Component Value Date   WBC 6.8 05/02/2019   HGB 12.9 (L) 05/03/2019   HCT 38.0 (L) 04/10/2019   MCV 83.1 04/06/2019   PLT 232 04/15/2019   Lab Results  Component Value Date   CREATININE 2.10 (H) 04/22/2019   BUN 36 (H) 04/23/2019   NA 139 04/17/2019   K 4.5 04/14/2019   CL 107 04/15/2019   CO2 19 (L) 04/12/2019    Anesthesia Physical Anesthesia Plan  ASA: IV and emergent  Anesthesia Plan: General   Post-op Pain Management:    Induction: Intravenous and Rapid sequence  PONV Risk Score and Plan: 2 and Dexamethasone, Ondansetron and Treatment may vary due to age or medical condition  Airway Management Planned: Oral ETT  Additional Equipment: Arterial line  Intra-op Plan:   Post-operative Plan: Possible Post-op intubation/ventilation  Informed Consent: I have reviewed the patients History and Physical, chart, labs and discussed the procedure including the risks, benefits and alternatives for the proposed anesthesia with the patient or authorized  representative who has indicated his/her understanding and acceptance.     Dental advisory given  Plan Discussed with: CRNA  Anesthesia Plan Comments:         Anesthesia Quick Evaluation

## 2019-04-10 NOTE — Progress Notes (Signed)
STROKE TEAM PROGRESS NOTE   INTERVAL HISTORY Pt lying in bed, TTE tech just finished echo. Pt severe dysarthria, asking for food. Pending swallow. His COVID testing also pending. Cerebral angiogram scheduled tomorrow.   Vitals:   05/02/2019 2227 04/28/2019 0700 04/30/2019 0727 04/30/2019 0950  BP: (!) 152/90  (!) 175/87 (!) 182/93  Pulse: 66  77   Resp: 16  18 18   SpO2: 99%  100%   Weight:  74.7 kg  76 kg  Height:  5\' 9"  (1.753 m)  5\' 9"  (1.753 m)    CBC:  Recent Labs  Lab 05/05/2019 2020 04/22/2019 2101  WBC 6.8  --   NEUTROABS 4.5  --   HGB 11.9* 12.9*  HCT 35.8* 38.0*  MCV 83.1  --   PLT 232  --     Basic Metabolic Panel:  Recent Labs  Lab 04/12/2019 2020 04/20/2019 2101  NA 135 139  K 4.4 4.5  CL 107 107  CO2 19*  --   GLUCOSE 176* 174*  BUN 33* 36*  CREATININE 2.08* 2.10*  CALCIUM 9.1  --    Lipid Panel:     Component Value Date/Time   CHOL 160 12/17/2018 1515   TRIG 136.0 12/17/2018 1515   HDL 23.90 (L) 12/17/2018 1515   CHOLHDL 7 12/17/2018 1515   VLDL 27.2 12/17/2018 1515   LDLCALC 109 (H) 12/17/2018 1515   HgbA1c:  Lab Results  Component Value Date   HGBA1C 10.0 (H) 02/22/2019   Urine Drug Screen: No results found for: LABOPIA, COCAINSCRNUR, LABBENZ, AMPHETMU, THCU, LABBARB  Alcohol Level No results found for: ETH  IMAGING Ct Head Wo Contrast  Result Date: 04/26/2019 CLINICAL DATA:  Generalized weakness EXAM: CT HEAD WITHOUT CONTRAST TECHNIQUE: Contiguous axial images were obtained from the base of the skull through the vertex without intravenous contrast. COMPARISON:  02/21/2019 head CT FINDINGS: Brain: No acute territorial infarction, hemorrhage or intracranial mass. Atrophy and moderate small vessel ischemic changes of the white matter. Chronic lacunar infarcts in the bilateral basal ganglia and thalamus. Stable ventricle size Vascular: No hyperdense vessels.  Carotid vascular calcification Skull: Normal. Negative for fracture or focal lesion.  Sinuses/Orbits: No acute finding. Other: None IMPRESSION: 1. No CT evidence for acute intracranial abnormality. 2. Atrophy and small vessel ischemic changes of the white matter. Chronic appearing infarcts in the basal ganglia and thalamus Electronically Signed   By: Donavan Foil M.D.   On: 04/07/2019 21:57   Mr Angio Head Wo Contrast  Result Date: 04/17/2019 CLINICAL DATA:  Speech difficulty EXAM: MRA HEAD WITHOUT CONTRAST TECHNIQUE: Angiographic images of the Circle of Willis were obtained using MRA technique without intravenous contrast. COMPARISON:  Brain MRI from earlier today FINDINGS: Dominant right V4 segment with high-grade narrowing past the PICA. Much of left vertebral flow is into the left PICA which is patent on source images. Moderate to advanced proximal basilar stenosis. Fetal type flow in both posterior cerebral arteries. The left upper PCA branch is highly stenotic. Mild narrowing at the left P3 segment. Atherosclerotic irregularity along both carotid siphons with subjective moderate narrowing at the paraclinoid segment on the left. No branch occlusion, beading, or aneurysm. IMPRESSION: Intracranial atherosclerosis most notable in the posterior circulation where there is high-grade stenosis of the dominant right V4 segment and moderate to advanced narrowing of the proximal basilar. Electronically Signed   By: Monte Fantasia M.D.   On: 04/08/2019 06:28   Mr Brain Wo Contrast  Result Date: 04/12/2019 CLINICAL DATA:  Speech difficulty. EXAM: MRI HEAD WITHOUT CONTRAST TECHNIQUE: Multiplanar, multiecho pulse sequences of the brain and surrounding structures were obtained without intravenous contrast. COMPARISON:  Head CT from yesterday FINDINGS: Brain: Restricted diffusion on both sides of the ventral medulla, correlating with history of weakness. Although it is unusual for brainstem infarcts to be bilateral, presence of small left superior cerebellar and right posterior temporal infarcts are  compatible with multifocal infarct. There is a background of chronic small vessel ischemia with remote infarcts in the bilateral cerebellum and brainstem and in the bilateral deep gray nuclei, especially thalamus. Confluent chronic small vessel ischemic gliosis in the deep cerebral white matter. Age normal brain volume. Chronic blood products in the right cerebellum considered post ischemic. No generalized chronic hemorrhagic injury. Vascular: Normal flow voids. Skull and upper cervical spine: Negative for marrow lesion. Sinuses/Orbits: Negative IMPRESSION: 1. Small acute infarcts in the bilateral ventral medulla, left superior cerebellum, and right perisylvian cortex. 2. 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 Electronically Signed   By: Monte Fantasia M.D.   On: 04/21/2019 05:54   Dg Chest Port 1 View  Result Date: 04/09/2019 CLINICAL DATA:  Generalized weakness with slurred speech EXAM: PORTABLE CHEST 1 VIEW COMPARISON:  02/21/2019 FINDINGS: The heart size and mediastinal contours are within normal limits. Both lungs are clear. The visualized skeletal structures are unremarkable. IMPRESSION: No active disease. Electronically Signed   By: Monte Fantasia M.D.   On: 04/17/2019 04:18    PHYSICAL EXAM  Temp:  [98.7 F (37.1 C)] 98.7 F (37.1 C) (11/05 1100) Pulse Rate:  [66-79] 79 (11/05 1100) Resp:  [16-18] 18 (11/05 0950) BP: (152-182)/(87-93) 177/90 (11/05 1100) SpO2:  [99 %-100 %] 100 % (11/05 1100) Weight:  [74.7 kg-76 kg] 76 kg (11/05 0950)  General - Well nourished, well developed, in no apparent distress.  Ophthalmologic - fundi not visualized due to noncooperation.  Cardiovascular - Regular rhythm and rate.  Mental Status -  Level of arousal and orientation to place, month and person were intact, but wrong on his age. Language including expression, naming, repetition, comprehension was assessed and found intact, severe dysarthria.  Cranial Nerves II - XII - II -  Visual field intact OU. III, IV, VI - Extraocular movements intact. V - Facial sensation intact bilaterally. VII - right nasolabial fold flattening. VIII - Hearing & vestibular intact bilaterally, unsustained end gaze nystagmus. X - Palate elevates symmetrically, severe dysarthria. XI - Chin turning & shoulder shrug intact bilaterally. XII - Tongue protrusion intact.  Motor Strength - The patient's strength was normal in RUE and RLE, however, LUE 5/5 proximal but 4/5 finger grip and wrist extension. LLE 3/5 proximal and 4/5 distal and pronator drift was absent.  Bulk was normal and fasciculations were absent.   Motor Tone - Muscle tone was assessed at the neck and appendages and was normal.  Reflexes - The patient's reflexes were symmetrical in all extremities and he had no pathological reflexes.  Sensory - Light touch, temperature/pinprick were assessed and were symmetrical subjectively.    Coordination - The patient had normal movements in the hands with no ataxia or dysmetria.  Tremor was absent.  Gait and Station - deferred.   ASSESSMENT/PLAN Mr. Perlie Dechene is a 78 y.o. male with history of DM2, erectile dysfunction, HLD and HTN presenting with 4 days of difficulty ambulating with left sided weakness, followed by 2 days of dysarthria.   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 Small vessel disease. Atrophy. Old basal ganglia and thalamic infarcts.  MRI  Small B ventral medullary, left pontine, left cerebellar and R perisylvian cortex infarcts. Small vessel disease.  MRA  Intracranial atherosclerosis w/ high-grade R V4 stenosis and moderate to advanced narrowing proximal BA  Cerebral angio w/ Dr. Estanislado Pandy pending   LE venous Doppler  pending  2D Echo  pending  May also consider loop recorder on discharge  LDL 109  HgbA1c 10.0  IV heparin for VTE prophylaxis  aspirin 81 mg daily prior to admission, now on aspirin 300 mg suppository daily and heparin IV waiting on cerebral angio.   Therapy recommendations:  pending  Disposition:  pending   Hypertension  Stable . Permissive hypertension (OK if < 220/120) but gradually normalize in 5-7 days . 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 After hours, contact General Neurology

## 2019-04-10 NOTE — Progress Notes (Signed)
Plan made for diagnostic angiogram in IR tomorrow 04/16/2019 per Dr. Estanislado Pandy.   NPO after MN.  Brynda Greathouse, MS RD PA-C 3:19 PM

## 2019-04-10 NOTE — ED Notes (Signed)
Pt noted to be sliding down in the chair he is sitting in. With assistance from [facility] and Security we were able to lift him back up into the chair. Pt's speech is getting worse. Wife tried to get Pt to take a drink of her soda but Pt is also having trouble swallowing. I did make Triage RN Jolayne Haines aware of my concerns about the Pt and Social worker.

## 2019-04-10 NOTE — Progress Notes (Signed)
ANTICOAGULATION CONSULT NOTE - Initial Consult  Pharmacy Consult for heparin Indication: stroke  Allergies  Allergen Reactions  . Lisinopril Swelling and Other (See Comments)    Lip swelling   . Codeine Other (See Comments)    "Makes me feel funny."    Patient Measurements: Height: 5\' 9"  (175.3 cm) Weight: 164 lb 10.9 oz (74.7 kg) IBW/kg (Calculated) : 70.7 Heparin Dosing Weight: 74.7kg  Vital Signs: BP: 152/90 (11/04 2227) Pulse Rate: 66 (11/04 2227)  Labs: Recent Labs    04/19/2019 2020 04/19/2019 2101  HGB 11.9* 12.9*  HCT 35.8* 38.0*  PLT 232  --   APTT 35  --   LABPROT 14.0  --   INR 1.1  --   CREATININE 2.08* 2.10*    Estimated Creatinine Clearance: 29.9 mL/min (A) (by C-G formula based on SCr of 2.1 mg/dL (H)).   Medical History: Past Medical History:  Diagnosis Date  . Anemia   . Diabetes mellitus type II 1990  . Diabetic nephropathy ([place])   . Diabetic neuropathy ([place])    sees optho every 6 months  . Elevated PSA 08/13/2016  . Erectile dysfunction   . Hyperlipidemia   . Hyperlipidemia, mixed 06/15/2008   Qualifier: Diagnosis of  By: Redmond Pulling MD, Frann Rider    . Hypertension    with some white coat HTN on top of essential HTN  . Low back pain 08/10/2016  . Preventative health care 08/24/2013  . Tinea corporis 02/14/2013  . Urinary hesitancy 08/26/2015    Medications:  Infusions:  . sodium chloride    . heparin      Assessment: 89 yom presented to the ED with blurred vision, slurred speech and LUE weakness. To start IV heparin for stroke with high grade stenosis. Discussed with neurology. Baseline CBC is WNL. He is not on anticoagulation PTA.   Goal of Therapy:  Heparin level 0.3-0.5 units/ml Monitor platelets by anticoagulation protocol: Yes   Plan:  Heparin gtt 900 units/hr - NO BOLUS Check an 8 hr heparin level Daily heparin level and CBC  Karmela Bram, Rande Lawman 04/15/2019,7:25 AM

## 2019-04-10 NOTE — Progress Notes (Addendum)
Called to bedside to evaluate worsening NIHSS. He had NIHSS of 5 at 5 PM per RN when he was at his post-presentation baseline. Subsequent NIHSS was 21 with patient complaining of worsened weakness of upper and lower extremities with inability to lift arms and legs.   Currently on IV heparin with SBP stable in 140s.   Neurological exam reveals intact mentation and speech comprehension with worsened facial weakness and dysarthria, now nearly unintelligible. BUE are with flaccid tone, 0/5 proximally with trace movement at elbows and 3/5 grip strength, slightly stronger on the right. BLE are flaccid with no movement except for weak internal rotation. Sensation is intact x 4.   A/R: 78 year old male with bilateral anterior medullary infarctions secondary to severe vertebrobasilar stenotic atherosclerotic disease.  1. Discussed current clinical findings with Dr. Hyman Hopes. Will obtain STAT CT head to r/o hemorrhage and if negative will call Code VIR for emergent angiography and possible stenting.  2. Patient able to comprehend all commands and questions. He exhibits understanding of the current clinical emergency. He is amenable to endovascular procedure. VIR team will need to obtain full informed consent.  3. Continue IV heparin.   25 minutes of critical care time spent in the emergent assessment of this critically ill patient.   Consent for endovascular procedure formally obtained from patient after he arrived to [place]. Clinically unchanged with quadriplegia. Dr. Estanislado Pandy and I discussed the patient's condition with family over the telephone.   Additional 15 minutes of critical care time.   Electronically signed: Dr. Kerney Elbe

## 2019-04-10 NOTE — Progress Notes (Addendum)
Before shift change, staff checked the patient and he was complaining that he can't feel his hands. We did again neuro check and we noticed that patient has drift in all his extremities, which is new at this time. VS stable. Writer paged MD and she recommended to talk with neurology. Neuro MD was paged. Will continue to monitor.

## 2019-04-10 NOTE — Progress Notes (Signed)
Lower extremity venous has been completed.   Preliminary results in CV Proc.   Abram Sander 04/19/2019 2:19 PM
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
## 2019-04-10 NOTE — Anesthesia Procedure Notes (Signed)
Arterial Line Insertion Start/End11/28/2020 10:15 PM, 04/10/2019 10:20 PM Performed by: Suzette Battiest, MD, anesthesiologist  Patient location: Pre-op. Preanesthetic checklist: patient identified, IV checked, site marked, risks and benefits discussed, surgical consent, monitors and equipment checked, pre-op evaluation, timeout performed and anesthesia consent Lidocaine 1% used for infiltration Left, radial was placed Catheter size: 20 Fr Hand hygiene performed  and maximum sterile barriers used   Attempts: 3 Procedure performed without using ultrasound guided technique. Following insertion, dressing applied and Biopatch. Post procedure assessment: normal and unchanged  Patient tolerated the procedure well with no immediate complications.

## 2019-04-10 NOTE — Plan of Care (Signed)
  Problem: Education: Goal: Knowledge of General Education information will improve Description: Including pain rating scale, medication(s)/side effects and non-pharmacologic comfort measures Outcome: Progressing   Problem: Health Behavior/Discharge Planning: Goal: Ability to manage health-related needs will improve Outcome: Progressing   Problem: Clinical Measurements: Goal: Ability to maintain clinical measurements within normal limits will improve Outcome: Progressing Goal: Will remain free from infection Outcome: Progressing Goal: Diagnostic test results will improve Outcome: Progressing Goal: Respiratory complications will improve Outcome: Progressing Goal: Cardiovascular complication will be avoided Outcome: Progressing   Problem: Activity: Goal: Risk for activity intolerance will decrease Outcome: Progressing   Problem: Nutrition: Goal: Adequate nutrition will be maintained Outcome: Progressing   Problem: Coping: Goal: Level of anxiety will decrease Outcome: Progressing   Problem: Elimination: Goal: Will not experience complications related to bowel motility Outcome: Progressing Goal: Will not experience complications related to urinary retention Outcome: Progressing   Problem: Pain Managment: Goal: General experience of comfort will improve Outcome: Progressing   Problem: Safety: Goal: Ability to remain free from injury will improve Outcome: Progressing   Problem: Skin Integrity: Goal: Risk for impaired skin integrity will decrease Outcome: Progressing   Problem: Education: Goal: Knowledge of disease or condition will improve Outcome: Progressing Goal: Knowledge of secondary prevention will improve Outcome: Progressing Goal: Knowledge of patient specific risk factors addressed and post discharge goals established will improve Outcome: Progressing   Problem: Nutrition: Goal: Risk of aspiration will decrease Outcome: Progressing   Problem: Ischemic  Stroke/TIA Tissue Perfusion: Goal: Complications of ischemic stroke/TIA will be minimized Outcome: Progressing

## 2019-04-10 NOTE — Progress Notes (Signed)
Patient trasfered from ED to [phone] via stretcher; alert and oriented x 4; no complaints of pain; IV on Right AC with ongoing Heparin 9 ml/hr, skin intact. Orient patient to room and unit; gave patient care guide; instructed how to use the call bell and  fall risk precautions. Will continue to monitor the patient.  Place patient on Tele monitor per MD order.

## 2019-04-10 NOTE — Progress Notes (Signed)
Writer sent a message (via [place]) and left a phone message to Dr. Cheral Marker. Will continue to monitor.

## 2019-04-10 NOTE — ED Notes (Signed)
Patient transported to MRI 
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
## 2019-04-10 NOTE — Evaluation (Signed)
Physical Therapy Evaluation Patient Details Name: Jermaine Mitchell MRN: [id number] DOB: 1940-07-17 Today's Date: 04/11/2019   History of Present Illness  Pt is a 78 yo male admitted with 4 day h/o L side weakness, difficulty walking, difficulty talking and doulble vision.  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  MRA showed posterior circulation deficits.    Clinical Impression   Pt presents with LLE/UE hemiparesis, impaired sensation, inability to participate in EOB or OOB mobility on eval due to fatigue, and decreased activity tolerance. Pt to benefit from acute PT to address deficits. PT performed bed-level assessment this session, pt states "come back tomorrow, I've had a busy day". PT recommending CIR level of care post-acutely to maximize pt mobility and independence post-acutely. PT to progress mobility as tolerated, and will continue to follow acutely.      Follow Up Recommendations CIR    Equipment Recommendations  Other (comment)(defer to next venue)    Recommendations for Other Services       Precautions / Restrictions Precautions Precautions: Fall Precaution Comments: LUE/LE hemiparesis Restrictions Weight Bearing Restrictions: No      Mobility  Bed Mobility Overal bed mobility: Needs Assistance             General bed mobility comments: NT ; pt open to strength, ROM, and subjective portion of exam but states he is too fatigued for EOB or OOB activity at this time.  Transfers                    Ambulation/Gait                Stairs            Wheelchair Mobility    Modified Rankin (Stroke Patients Only)       Balance                                             Pertinent Vitals/Pain Pain Assessment: No/denies pain Faces Pain Scale: No hurt    Home Living Family/patient expects to be discharged to:: Private residence Living  Arrangements: Spouse/significant other;Children Available Help at Discharge: Family;Available 24 hours/day Type of Home: House Home Access: Stairs to enter Entrance Stairs-Rails: Psychiatric nurse of Steps: 4 Home Layout: One level Home Equipment: Cane - single point;Walker - 2 wheels;Bedside commode;Shower seat Additional Comments: Pt does not drive.    Prior Function Level of Independence: Needs assistance   Gait / Transfers Assistance Needed: Pt walks with walker most of the time.  ADL's / Homemaking Assistance Needed: Wife does household tasks, pt states "she is wonderful". Helps pt with his bathing and dressing. Pt states he could toilet, groom and feed himself.        Hand Dominance   Dominant Hand: Right    Extremity/Trunk Assessment   Upper Extremity Assessment Upper Extremity Assessment: Defer to OT evaluation    Lower Extremity Assessment Lower Extremity Assessment: LLE deficits/detail LLE Deficits / Details: PROM: WFL; AROM (in supine): 1/5 hip flexion, 3/5 knee extension, 2/5 DF; limited by pt fatigue and participation       Communication   Communication: Other (comment);Expressive difficulties(pt is dysarthric)  Cognition Arousal/Alertness: Awake/alert Behavior During Therapy: WFL for tasks assessed/performed Overall Cognitive Status: Difficult to assess  General Comments: Wife not available to know pt's baseline cognitive status but feel he is probably at baseline.  Pt was slow to answer but answered questions correctly and accurately.       General Comments General comments (skin integrity, edema, etc.): Pt with resting horizontal nystagmus, able to orient to L side with verbal and tactile cuing.    Exercises     Assessment/Plan    PT Assessment Patient needs continued PT services  PT Problem List Decreased strength;Decreased mobility;Decreased activity tolerance;Decreased  balance;Decreased knowledge of use of DME;Impaired sensation;Decreased safety awareness       PT Treatment Interventions DME instruction;Therapeutic activities;Gait training;Therapeutic exercise;Patient/family education;Balance training;Functional mobility training;Neuromuscular re-education    PT Goals (Current goals can be found in the Care Plan section)       Frequency Min 4X/week   Barriers to discharge        Co-evaluation               AM-PAC PT "6 Clicks" Mobility  Outcome Measure Help needed turning from your back to your side while in a flat bed without using bedrails?: Total Help needed moving from lying on your back to sitting on the side of a flat bed without using bedrails?: Total Help needed moving to and from a bed to a chair (including a wheelchair)?: Total Help needed standing up from a chair using your arms (e.g., wheelchair or bedside chair)?: Total Help needed to walk in hospital room?: Total Help needed climbing 3-5 steps with a railing? : Total 6 Click Score: 6    End of Session Equipment Utilized During Treatment: Oxygen(pt short of breath, sats WFL so on O2 for comfort) Activity Tolerance: Patient limited by fatigue Patient left: in bed;with call bell/phone within reach;with bed alarm set;with nursing/sitter in room Nurse Communication: Mobility status PT Visit Diagnosis: Hemiplegia and hemiparesis;Other abnormalities of gait and mobility (R26.89) Hemiplegia - Right/Left: Left Hemiplegia - dominant/non-dominant: Non-dominant Hemiplegia - caused by: Cerebral infarction    Time: [id number] PT Time Calculation (min) (ACUTE ONLY): 10 min   Charges:   PT Evaluation $PT Eval Low Complexity: 1 Low        Nivea Wojdyla E, PT Acute Rehabilitation Services Pager [phone]  Office [phone]  Jadae Steinke D Elonda Husky 04/28/2019, 4:46 PM

## 2019-04-10 NOTE — Evaluation (Signed)
Occupational Therapy Evaluation Patient Details Name: Jermaine Mitchell MRN: [id number] DOB: [birth year] Today's Date: 04/30/2019    History of Present Illness Pt is a 78 yo male admitted with 4 day h/o L side weakness, difficulty walking, difficulty talking and doulble vision.  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  MRA showed posterior circulation deficits.     Clinical Impression   Pt admitted with the above diagnosis and has the deficits outlined below. Pt would benefit from cont OT to increase independence in basic adls and adl mobility so he can return to close to baseline and d/c home with his wife.  Pt was receiving some help with adls PTA, but could walk with walker, toilet and mobilize with very little assist.  Feel inpt rehab may be an option for this pt as his wife is very supportive and he has some movement in L side to build on.  Will continue to see acutely.    Follow Up Recommendations  Supervision/Assistance - 24 hour;CIR    Equipment Recommendations  None recommended by OT    Recommendations for Other Services Rehab consult     Precautions / Restrictions Precautions Precautions: Fall Precaution Comments: Pt weak on left. Restrictions Weight Bearing Restrictions: No Other Position/Activity Restrictions: Pt very nervous about his blood sugars. Very fixated on this.  After much discussion, pt has passed out due to poor blood sugars in the past and he is very worried that they will not be checked here as he does at home.  Reassured him that the nurses will be tending to his blood sugars as the doctor orders.      Mobility Bed Mobility Overal bed mobility: Needs Assistance Bed Mobility: Supine to Sit     Supine to sit: Mod assist     General bed mobility comments: Pt with L side weak.  Pt can do more for himself with encrouagement and VCS.  Transfers Overall transfer level: Needs  assistance   Transfers: Sit to/from Stand;Stand Pivot Transfers Sit to Stand: Max assist Stand pivot transfers: Max assist       General transfer comment: Pt did not come to a complete stand.  More than a squat pivot but then started reaching with R hand to arm rail so never achieved full stand.    Balance Overall balance assessment: History of Falls;Needs assistance Sitting-balance support: Feet supported;Bilateral upper extremity supported Sitting balance-Leahy Scale: Fair Sitting balance - Comments: Pt swaying at first. When hips were righted, he was able to sit statically with min guard.   Standing balance support: Bilateral upper extremity supported;During functional activity Standing balance-Leahy Scale: Poor Standing balance comment: Pt requires significant outside assist to stand.                           ADL either performed or assessed with clinical judgement   ADL Overall ADL's : Needs assistance/impaired Eating/Feeding: NPO;Minimal assistance;Sitting Eating/Feeding Details (indicate cue type and reason): NPO until ST evals.  Physically should be able to feed self with at minimum min assist. Grooming: Wash/dry hands;Wash/dry face;Oral care;Minimal assistance;Sitting   Upper Body Bathing: Moderate assistance;Sitting   Lower Body Bathing: Maximal assistance;Sit to/from stand;Cueing for compensatory techniques Lower Body Bathing Details (indicate cue type and reason): pt unable to stand without a great amount of assist. Upper Body Dressing : Moderate assistance;Sitting   Lower Body Dressing: Maximal assistance;Sit to/from stand;Cueing  for compensatory techniques   Toilet Transfer: Maximal assistance;Squat-pivot;BSC Toilet Transfer Details (indicate cue type and reason): squat pivot to R side.  Toileting- Clothing Manipulation and Hygiene: Maximal assistance;Sit to/from stand       Functional mobility during ADLs: Maximal assistance General ADL Comments:  Pt able to assist with most UE adls. pt very limited with LE adls due to LLE weakness, poor sitting and standing balance and a mild L inattention.      Vision Baseline Vision/History: Wears glasses(has issues with vision due to blood sugars.) Wears Glasses: At all times Patient Visual Report: Other (comment)(diplopia comes and goes.  not currently present) Vision Assessment?: Vision impaired- to be further tested in functional context Additional Comments: Pt with difficulty scanning to the right and appears to move head to a place  he could see my fingers and how many were up.  Further evaluation needed.     Perception Perception Perception Tested?: No   Praxis Praxis Praxis tested?: Within functional limits    Pertinent Vitals/Pain Pain Assessment: No/denies pain     Hand Dominance Right   Extremity/Trunk Assessment Upper Extremity Assessment Upper Extremity Assessment: LUE deficits/detail LUE Deficits / Details: AROM: WFL with mild limitations in shoulder flexion. Strength:  shoulder 3-/5, biceps 4/5, triceps 4-/5, grip 4/5 LUE Sensation: decreased light touch LUE Coordination: decreased fine motor   Lower Extremity Assessment Lower Extremity Assessment: Defer to PT evaluation   Cervical / Trunk Assessment Cervical / Trunk Assessment: Normal   Communication Communication Communication: Other (comment)(pt is dysarthric)   Cognition Arousal/Alertness: Awake/alert Behavior During Therapy: WFL for tasks assessed/performed Overall Cognitive Status: Difficult to assess                                 General Comments: Wife not available to know pt's baseline cognitive status but feel he is probably at baseline.  Pt was slow to answer but answered questions correctly and accurately.    General Comments  Pt with mild L neglect, appears cognitively intact but will need significant assist at home. Feel CIR may be a good option if family supportive.    Exercises      Shoulder Instructions      Home Living Family/patient expects to be discharged to:: Private residence Living Arrangements: Spouse/significant other Available Help at Discharge: Family;Available 24 hours/day Type of Home: House Home Access: Stairs to enter [employer] of Steps: 4 Entrance Stairs-Rails: Right;Left Home Layout: One level     Bathroom Shower/Tub: Occupational psychologist: Handicapped height     Home Equipment: [place] - single point;Walker - 2 wheels;Bedside commode;Shower seat   Additional Comments: pt's wife helps him when showering. Pt does not drive.      Prior Functioning/Environment Level of Independence: Needs assistance  Gait / Transfers Assistance Needed: Pt walks with walker most of the time. ADL's / Homemaking Assistance Needed: Wife does all homemaking. Helps pt with his bathing and dressing. Pt states he could toilet, groom and feed himself. '   Comments: wife does the homemaking, doesn't drive        OT Problem List: Decreased strength;Decreased activity tolerance;Impaired balance (sitting and/or standing);Impaired vision/perception;Decreased coordination;Decreased knowledge of use of DME or AE;Impaired sensation;Impaired UE functional use      OT Treatment/Interventions: Self-care/ADL training;Neuromuscular education;Therapeutic activities;Balance training    OT Goals(Current goals can be found in the care plan section) Acute Rehab OT Goals Patient Stated Goal:  to get back to what I could do before. OT Goal Formulation: With patient Time For Goal Achievement: 04/24/19 Potential to Achieve Goals: Good ADL Goals Pt Will Perform Eating: with supervision;sitting Pt Will Perform Upper Body Dressing: with min guard assist;sitting Pt Will Perform Lower Body Dressing: with min assist;sit to/from stand Pt Will Transfer to Toilet: with min assist;stand pivot transfer;grab bars Pt Will Perform Toileting - Clothing Manipulation and  hygiene: with min assist;sit to/from stand Pt Will Perform Tub/Shower Transfer: Shower transfer;with min assist;shower seat  OT Frequency: Min 2X/week   Barriers to D/C:            Co-evaluation              AM-PAC OT "6 Clicks" Daily Activity     Outcome Measure Help from another person eating meals?: A Little Help from another person taking care of personal grooming?: A Little Help from another person toileting, which includes using toliet, bedpan, or urinal?: A Lot Help from another person bathing (including washing, rinsing, drying)?: A Lot Help from another person to put on and taking off regular upper body clothing?: A Lot Help from another person to put on and taking off regular lower body clothing?: A Lot 6 Click Score: 14   End of Session Equipment Utilized During Treatment: Rolling walker;Gait belt Nurse Communication: Mobility status  Activity Tolerance: Patient limited by fatigue Patient left: in chair;with call bell/phone within reach;with chair alarm set  OT Visit Diagnosis: Unsteadiness on feet (R26.81);Other abnormalities of gait and mobility (R26.89);Repeated falls (R29.6);Muscle weakness (generalized) (M62.81);Low vision, both eyes (H54.2);Other symptoms and signs involving the nervous system (R29.898);Hemiplegia and hemiparesis Hemiplegia - Right/Left: Left Hemiplegia - dominant/non-dominant: Non-Dominant Hemiplegia - caused by: Cerebral infarction                Time: 1030-1100 OT Time Calculation (min): 30 min Charges:  OT General Charges $OT Visit: 1 Visit OT Evaluation $OT Eval Moderate Complexity: 1 Mod OT Treatments $Self Care/Home Management : 8-22 mins  Glenford Peers 04/18/2019, 11:25 AM

## 2019-04-10 NOTE — Progress Notes (Addendum)
Inpatient Rehab Admissions:  Inpatient Rehab Consult received.  I met with patient at the bedside for rehabilitation assessment and to discuss goals and expectations of an inpatient rehab admission.  I also spoke to his wife over the phone.  She is retired and can provide 24/7 assist.  She will also have assist from her daughter and grandson (both CNAs).  I will open up insurance for prior authorization for possible admission pending approval and bed availability.   Signed: Shann Medal, PT, DPT Admissions Coordinator [phone] 05/02/2019  3:39 PM

## 2019-04-10 NOTE — ED Notes (Signed)
Pt's speech is starting to become increasingly slurred. Notified RN [employer]. Pt stated he feels like he is going to pass out. Checked Pt's CBG. CBG-[id number]. Pt's Wife is sitting with Pt at this time.

## 2019-04-10 NOTE — Progress Notes (Signed)
Patient complaining of SOB. Sat O2 99% on RA, but patient asked if he can have any oxygen. Patient on 2L via [place], verbalized that is feeling better. MD notified. Will continue to monitor.

## 2019-04-10 NOTE — Progress Notes (Signed)
According to day shift nurse, patient's NIHSS score was 14 at 1900. When I went to the patient's room then i found NIHSS score 21 in my assessment . Notified to the rapid response and neurology MD [place]. They came to the patient's bed side and neurology ordered for CT head contrast and MR Brain. patient transfer to the radiology unit for the procedure after CT head. Try to call Patient's spouse Jomel Papworth  but she didn't  Pick up the phone. Report given to the [place].

## 2019-04-10 NOTE — ED Notes (Signed)
Verbal given for 300mg  ASA suppository Cheral Marker MD

## 2019-04-10 NOTE — Progress Notes (Signed)
ANTICOAGULATION CONSULT NOTE - [place] for heparin Indication: stroke  Allergies  Allergen Reactions  . Lisinopril Swelling and Other (See Comments)    Lip swelling   . Codeine Other (See Comments)    "Makes me feel funny."    Patient Measurements: Height: 5\' 9"  (175.3 cm) Weight: 167 lb 8.8 oz (76 kg) IBW/kg (Calculated) : 70.7 Heparin Dosing Weight: 74.7kg  Vital Signs: Temp: 98.9 F (37.2 C) (11/05 1700) Temp Source: Rectal (11/05 1700) BP: 160/79 (11/05 1700) Pulse Rate: 79 (11/05 1700)  Labs: Recent Labs    05/02/2019 2020 04/08/2019 2101 04/06/2019 1602  HGB 11.9* 12.9*  --   HCT 35.8* 38.0*  --   PLT 232  --   --   APTT 35  --   --   LABPROT 14.0  --   --   INR 1.1  --   --   HEPARINUNFRC  --   --  0.39  CREATININE 2.08* 2.10*  --     Estimated Creatinine Clearance: 29.9 mL/min (A) (by C-G formula based on SCr of 2.1 mg/dL (H)).   Medical History: Past Medical History:  Diagnosis Date  . Anemia   . Diabetes mellitus type II 1990  . Diabetic nephropathy ([place])   . Diabetic neuropathy ([place])    sees optho every 6 months  . Elevated PSA 08/13/2016  . Erectile dysfunction   . Hyperlipidemia   . Hyperlipidemia, mixed 06/15/2008   Qualifier: Diagnosis of  By: Redmond Pulling MD, Frann Rider    . Hypertension    with some white coat HTN on top of essential HTN  . Low back pain 08/10/2016  . Preventative health care 08/24/2013  . Stroke ([place]) 04/2019  . Tinea corporis 02/14/2013  . Urinary hesitancy 08/26/2015    Medications:  Infusions:  . sodium chloride 1,000 mL (05/01/2019 1556)  . heparin 900 Units/hr (04/12/2019 [id number])    Assessment: 78 yr old male presented to the ED with blurred vision, slurred speech and LUE weakness; started IV heparin for stroke with high grade stenosis. Discussed with neurology. Baseline CBC is WNL. He was not on anticoagulation PTA.   Ultrasound of lower extremities revealed no evidence of DVT; planned for  diagnostic angiogram in IR tomorrow.   Initial heparin level (~8.5 hr level) on heparin infusion of 900 units/hr (no bolus) was 0.39 units/ml, which is within the goal range for this patient. Per RN, no issues with IV or bleeding observed.  Goal of Therapy:  Heparin level 0.3-0.5 units/ml Monitor platelets by anticoagulation protocol: Yes   Plan:  Continue heparin infusion at 900 units/hr - NO BOLUS Check 8-hr level Monitor daily heparin level and CBC Monitor for signs/symptoms of bleeding  Gillermina Hu, PharmD, BCPS, [facility] Clinical Pharmacist 04/10/2019,5:25 PM
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
## 2019-04-10 NOTE — Significant Event (Signed)
Rapid Response Event Note  Overview: New Neurological change  Initial Focused Assessment: Pt with known infarcts and BA stenosis leaving him with left sided weakness and dysarthria. Night RN Sarala notified my of an increase of NIHSS from 14 to now 21 with new changes of right sided weakness and worsening dysarthria. She notified Dr. Cheral Marker prior to my arrival and he came to the bedside shortly after my arrival.   Interventions: -Stat Head CT -Tx to IR if Head CT negative for hemmorage -tx to 4N ICU following intervention -New PIV  Event Summary: Call received 2038 Arrived at call 2042 Call ended [phone]  [name]

## 2019-04-10 NOTE — Progress Notes (Signed)
  Echocardiogram 2D Echocardiogram has been performed.  Jermaine Mitchell M 04/06/2019, 12:58 PM
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
## 2019-04-10 NOTE — Anesthesia Procedure Notes (Signed)
Procedure Name: Intubation Date/Time: 04/14/2019 10:08 PM Performed by: Jearld Pies, CRNA Pre-anesthesia Checklist: Patient identified, Emergency Drugs available, Suction available and Patient being monitored Patient Re-evaluated:Patient Re-evaluated prior to induction Oxygen Delivery Method: Circle System Utilized Preoxygenation: Pre-oxygenation with 100% oxygen Induction Type: IV induction and Rapid sequence Laryngoscope Size: Mac and 3 Grade View: Grade I Tube type: Oral Tube size: 7.5 mm Number of attempts: 1 Airway Equipment and Method: Stylet and Oral airway Placement Confirmation: ETT inserted through vocal cords under direct vision,  positive ETCO2 and breath sounds checked- equal and bilateral Secured at: 23 cm Tube secured with: Tape Dental Injury: Teeth and Oropharynx as per pre-operative assessment  Comments: Patient's upper level dentures removed. Added to patient's belongings.

## 2019-04-10 NOTE — Progress Notes (Signed)
Rehab Admissions Coordinator Note:  Patient was screened by Michel Santee for appropriateness for an Inpatient Acute Rehab Consult.  At this time, we are recommending Inpatient Rehab consult.  Michel Santee 05/02/2019, 11:28 AM  I can be reached at [phone].
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
## 2019-04-10 NOTE — Progress Notes (Signed)
Speech therapy was called and informed that patient has an order for SLP eval. Per therapy staff, a therapist will come to evaluate the patient when somebody will be available. Will continue to monitor.

## 2019-04-11 ENCOUNTER — Encounter (HOSPITAL_COMMUNITY): Payer: Self-pay | Admitting: Interventional Radiology

## 2019-04-11 ENCOUNTER — Inpatient Hospital Stay (HOSPITAL_COMMUNITY): Payer: Medicare HMO

## 2019-04-11 DIAGNOSIS — T68XXXA Hypothermia, initial encounter: Secondary | ICD-10-CM

## 2019-04-11 DIAGNOSIS — Z978 Presence of other specified devices: Secondary | ICD-10-CM

## 2019-04-11 DIAGNOSIS — J9601 Acute respiratory failure with hypoxia: Secondary | ICD-10-CM

## 2019-04-11 DIAGNOSIS — I63219 Cerebral infarction due to unspecified occlusion or stenosis of unspecified vertebral arteries: Secondary | ICD-10-CM | POA: Diagnosis present

## 2019-04-11 HISTORY — PX: IR PTA INTRACRANIAL: IMG2344

## 2019-04-11 HISTORY — PX: IR CT HEAD LTD: IMG2386

## 2019-04-11 HISTORY — PX: IR ANGIO INTRA EXTRACRAN SEL COM CAROTID INNOMINATE UNI R MOD SED: IMG5359

## 2019-04-11 LAB — CBC WITH DIFFERENTIAL/PLATELET
Abs Immature Granulocytes: 0.03 10*3/uL (ref 0.00–0.07)
Basophils Absolute: 0 10*3/uL (ref 0.0–0.1)
Basophils Relative: 0 %
Eosinophils Absolute: 0.3 10*3/uL (ref 0.0–0.5)
Eosinophils Relative: 3 %
HCT: 31.1 % — ABNORMAL LOW (ref 39.0–52.0)
Hemoglobin: 10.3 g/dL — ABNORMAL LOW (ref 13.0–17.0)
Immature Granulocytes: 0 %
Lymphocytes Relative: 16 %
Lymphs Abs: 1.3 10*3/uL (ref 0.7–4.0)
MCH: 27.3 pg (ref 26.0–34.0)
MCHC: 33.1 g/dL (ref 30.0–36.0)
MCV: 82.5 fL (ref 80.0–100.0)
Monocytes Absolute: 0.7 10*3/uL (ref 0.1–1.0)
Monocytes Relative: 8 %
Neutro Abs: 6.1 10*3/uL (ref 1.7–7.7)
Neutrophils Relative %: 73 %
Platelets: 195 10*3/uL (ref 150–400)
RBC: 3.77 MIL/uL — ABNORMAL LOW (ref 4.22–5.81)
RDW: 14.8 % (ref 11.5–15.5)
WBC: 8.4 10*3/uL (ref 4.0–10.5)
nRBC: 0 % (ref 0.0–0.2)

## 2019-04-11 LAB — POCT I-STAT 7, (LYTES, BLD GAS, ICA,H+H)
Acid-base deficit: 12 mmol/L — ABNORMAL HIGH (ref 0.0–2.0)
Acid-base deficit: 8 mmol/L — ABNORMAL HIGH (ref 0.0–2.0)
Bicarbonate: 13.8 mmol/L — ABNORMAL LOW (ref 20.0–28.0)
Bicarbonate: 17 mmol/L — ABNORMAL LOW (ref 20.0–28.0)
Calcium, Ion: 1.18 mmol/L (ref 1.15–1.40)
Calcium, Ion: 1.19 mmol/L (ref 1.15–1.40)
HCT: 26 % — ABNORMAL LOW (ref 39.0–52.0)
HCT: 22 % — ABNORMAL LOW (ref 39.0–52.0)
Hemoglobin: 8.8 g/dL — ABNORMAL LOW (ref 13.0–17.0)
Hemoglobin: 7.5 g/dL — ABNORMAL LOW (ref 13.0–17.0)
O2 Saturation: 100 %
O2 Saturation: 100 %
Patient temperature: 98.6
Patient temperature: 34.6
Potassium: 4.9 mmol/L (ref 3.5–5.1)
Potassium: 3.9 mmol/L (ref 3.5–5.1)
Sodium: 141 mmol/L (ref 135–145)
Sodium: 143 mmol/L (ref 135–145)
TCO2: 15 mmol/L — ABNORMAL LOW (ref 22–32)
TCO2: 18 mmol/L — ABNORMAL LOW (ref 22–32)
pCO2 arterial: 30.4 mmHg — ABNORMAL LOW (ref 32.0–48.0)
pCO2 arterial: 29.3 mmHg — ABNORMAL LOW (ref 32.0–48.0)
pH, Arterial: 7.266 — ABNORMAL LOW (ref 7.350–7.450)
pH, Arterial: 7.36 (ref 7.350–7.450)
pO2, Arterial: 258 mmHg — ABNORMAL HIGH (ref 83.0–108.0)
pO2, Arterial: 224 mmHg — ABNORMAL HIGH (ref 83.0–108.0)

## 2019-04-11 LAB — BASIC METABOLIC PANEL
Anion gap: 7 (ref 5–15)
BUN: 33 mg/dL — ABNORMAL HIGH (ref 8–23)
CO2: 17 mmol/L — ABNORMAL LOW (ref 22–32)
Calcium: 8.1 mg/dL — ABNORMAL LOW (ref 8.9–10.3)
Chloride: 114 mmol/L — ABNORMAL HIGH (ref 98–111)
Creatinine, Ser: 2.05 mg/dL — ABNORMAL HIGH (ref 0.61–1.24)
GFR calc Af Amer: 35 mL/min — ABNORMAL LOW (ref >60)
GFR calc non Af Amer: 31 mL/min — ABNORMAL LOW (ref >60)
Glucose, Bld: 212 mg/dL — ABNORMAL HIGH (ref 70–99)
Potassium: 4.5 mmol/L (ref 3.5–5.1)
Sodium: 138 mmol/L (ref 135–145)

## 2019-04-11 LAB — TSH: TSH: 2.381 u[IU]/mL (ref 0.350–4.500)

## 2019-04-11 LAB — GLUCOSE, CAPILLARY
Glucose-Capillary: 293 mg/dL — ABNORMAL HIGH (ref 70–99)
Glucose-Capillary: 238 mg/dL — ABNORMAL HIGH (ref 70–99)
Glucose-Capillary: 185 mg/dL — ABNORMAL HIGH (ref 70–99)
Glucose-Capillary: 160 mg/dL — ABNORMAL HIGH (ref 70–99)

## 2019-04-11 LAB — POCT I-STAT, CHEM 8
BUN: 29 mg/dL — ABNORMAL HIGH (ref 8–23)
Calcium, Ion: 1.18 mmol/L (ref 1.15–1.40)
Chloride: 113 mmol/L — ABNORMAL HIGH (ref 98–111)
Creatinine, Ser: 1.8 mg/dL — ABNORMAL HIGH (ref 0.61–1.24)
Glucose, Bld: 154 mg/dL — ABNORMAL HIGH (ref 70–99)
HCT: 27 % — ABNORMAL LOW (ref 39.0–52.0)
Hemoglobin: 9.2 g/dL — ABNORMAL LOW (ref 13.0–17.0)
Potassium: 4.1 mmol/L (ref 3.5–5.1)
Sodium: 143 mmol/L (ref 135–145)
TCO2: 20 mmol/L — ABNORMAL LOW (ref 22–32)

## 2019-04-11 LAB — LIPID PANEL
Cholesterol: 194 mg/dL (ref 0–200)
HDL: 33 mg/dL — ABNORMAL LOW (ref >40)
LDL Cholesterol: 152 mg/dL — ABNORMAL HIGH (ref 0–99)
Total CHOL/HDL Ratio: 5.9 RATIO
Triglycerides: 45 mg/dL (ref <150)
VLDL: 9 mg/dL (ref 0–40)

## 2019-04-11 LAB — CORTISOL: Cortisol, Plasma: 22.4 ug/dL

## 2019-04-11 LAB — HEPARIN LEVEL (UNFRACTIONATED): Heparin Unfractionated: 1.3 IU/mL — ABNORMAL HIGH (ref 0.30–0.70)

## 2019-04-11 LAB — HEMOGLOBIN A1C
Hgb A1c MFr Bld: 9 % — ABNORMAL HIGH (ref 4.8–5.6)
Mean Plasma Glucose: 211.6 mg/dL

## 2019-04-11 IMAGING — MR MR HEAD W/O CM
9 of 11 series · 33 of 48 positions shown · non-contrast
Comparison: [DATE]

CLINICAL DATA: Strokes post right vertebral artery angioplasty

EXAM:
MRI HEAD WITHOUT CONTRAST
MRA HEAD WITHOUT CONTRAST
TECHNIQUE: Multiplanar, multiecho pulse sequences of the brain and surrounding
structures were obtained without intravenous contrast. Angiographic
images of the head were obtained using MRA technique without
contrast.

[Series 3: DWI · axial · 3.0mm · 1.09mm/px · z∈[-22,+116]mm · 8 of 96 slices shown (1 of 4)]
[im 1/96]
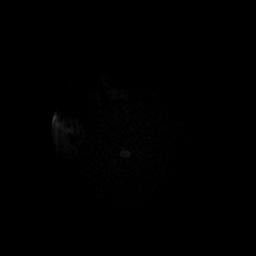
[im 14/96]
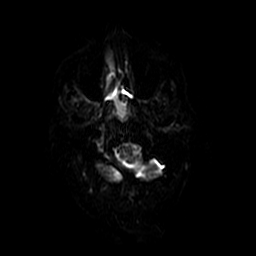
[im 28/96]
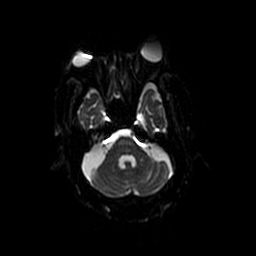
[im 41/96]
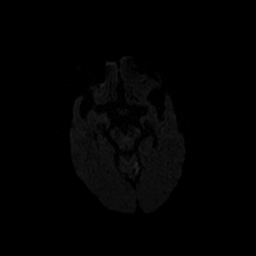
[im 55/96]
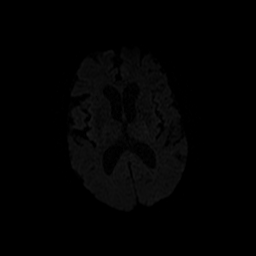
[im 68/96]
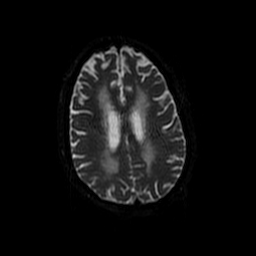
[im 82/96]
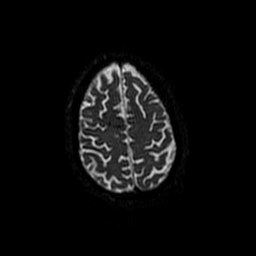
[im 96/96]
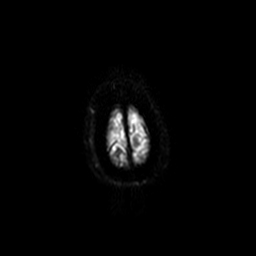

[Series 5: DWI · coronal · 5.0mm · 1.09mm/px · 6 of 80 slices shown (2 of 4)]
[im 1/80]
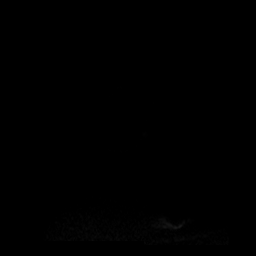
[im 16/80]
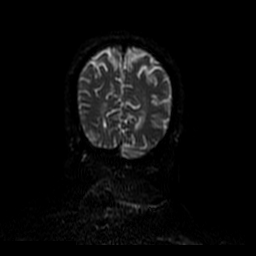
[im 32/80]
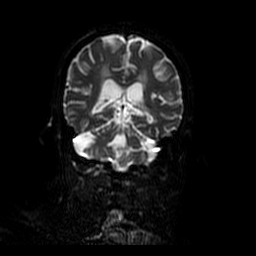
[im 48/80]
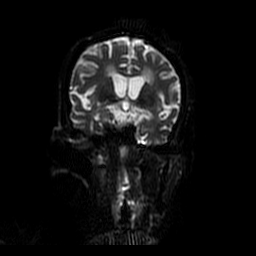
[im 64/80]
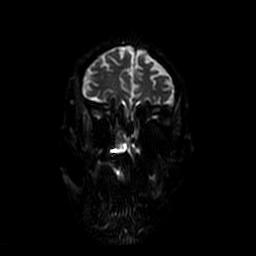
[im 80/80]
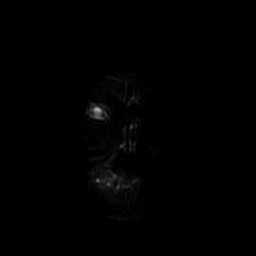

[Series 6: T2 · axial · 5.0mm · 0.43mm/px · z∈[-28,+107]mm · 2 of 24 slices shown (1 of 2)]
[im 1/24]
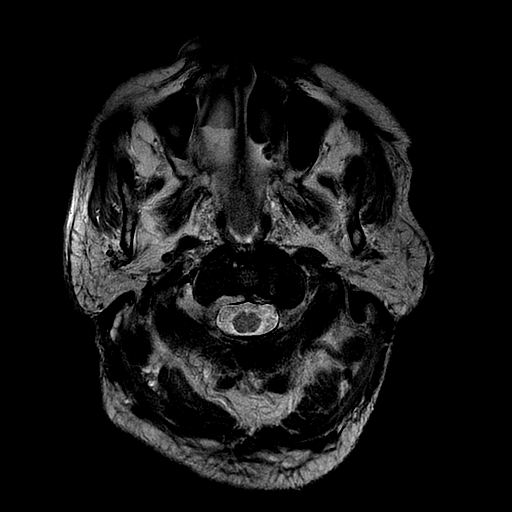
[im 24/24]
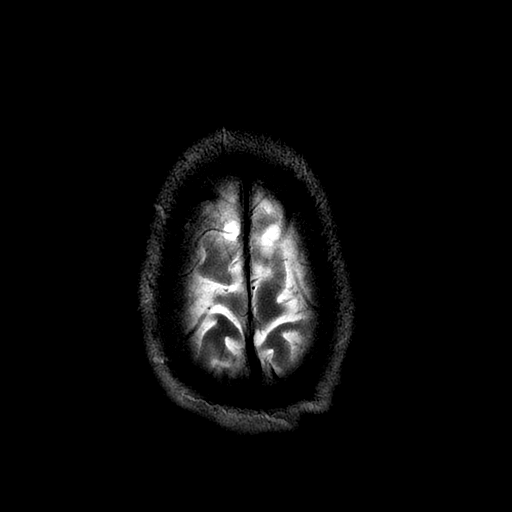

[Series 7: FLAIR · axial · 5.0mm · 0.43mm/px · z∈[-28,+107]mm · 2 of 24 slices shown]
[im 1/24]
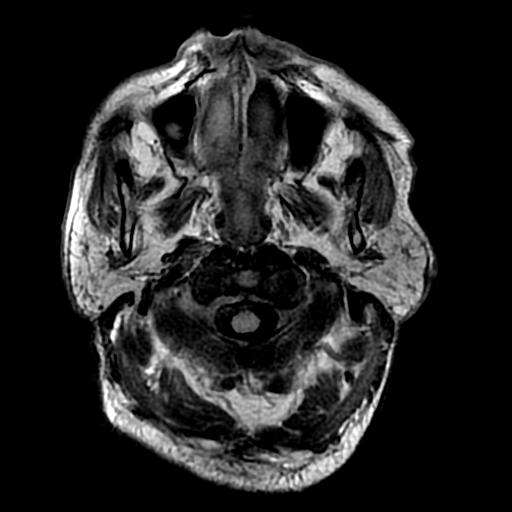
[im 24/24]
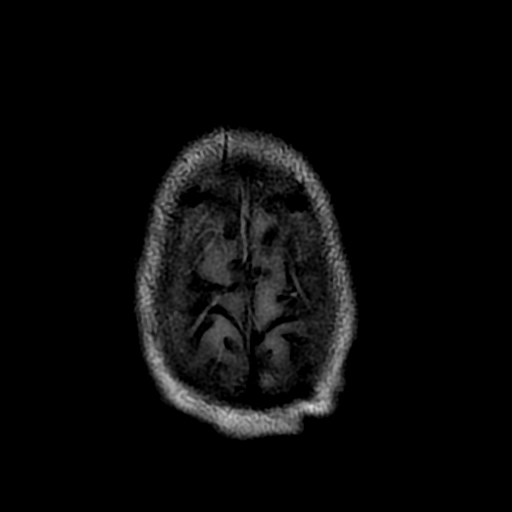

[Series 8: T1 · sagittal · 5.0mm · 0.47mm/px · 2 of 23 slices shown (1 of 2)]
[im 1/23]
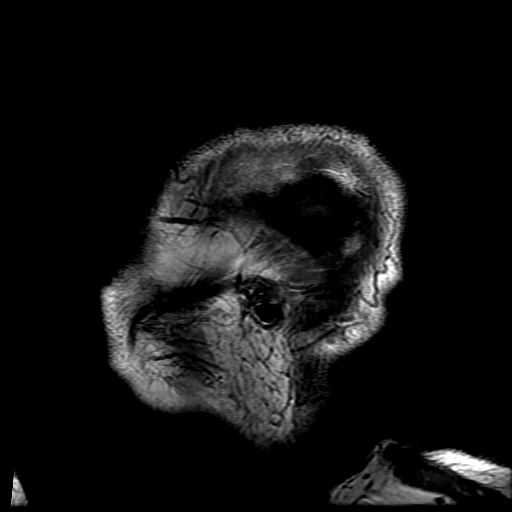
[im 23/23]
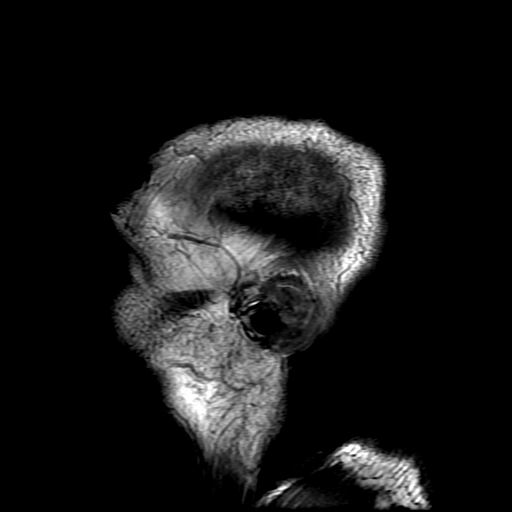

[Series 10: T1 · axial · 3.0mm · 0.47mm/px · z∈[-27,+42]mm · 4 of 96 slices shown (2 of 2)]
[im 1/96]
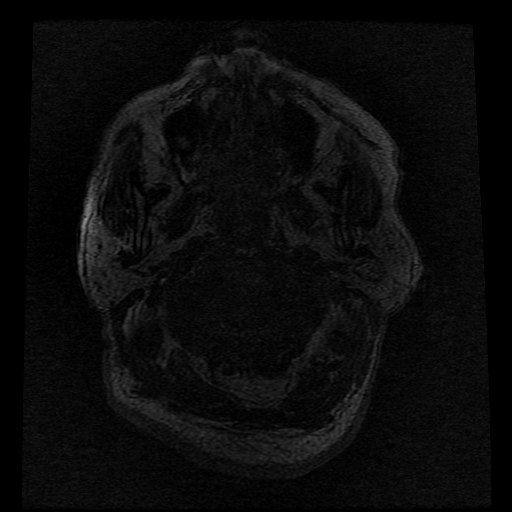
[im 16/96]
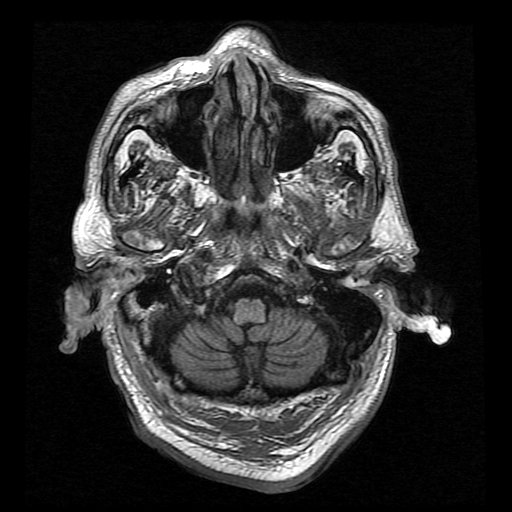
[im 32/96]
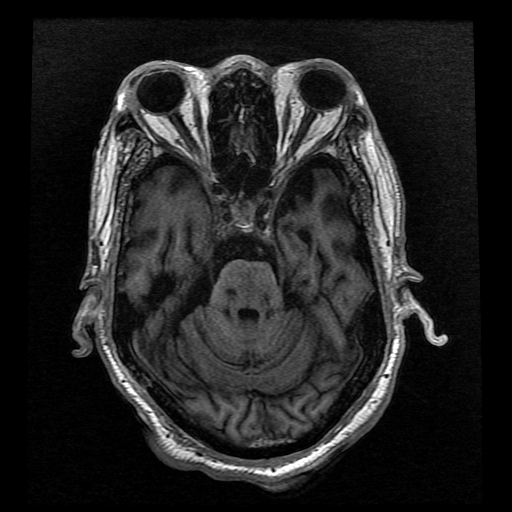
[im 48/96]
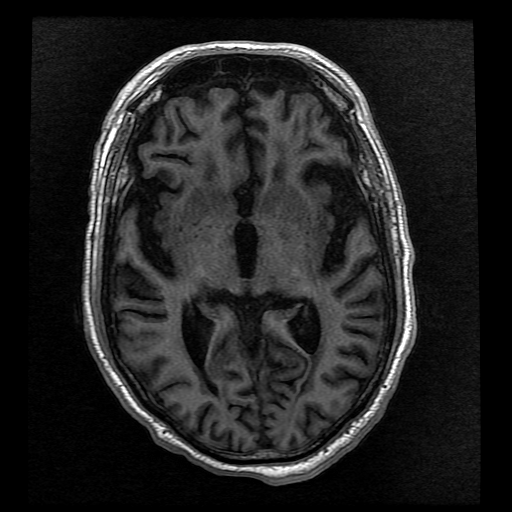

[Series 11: T2 · coronal · 5.0mm · 0.43mm/px · 2 of 32 slices shown (2 of 2)]
[im 1/32]
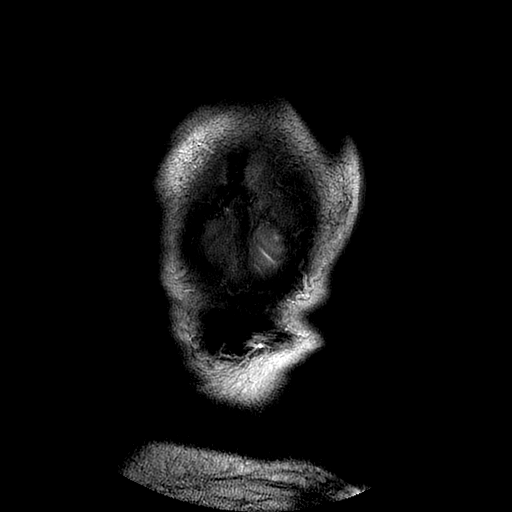
[im 32/32]
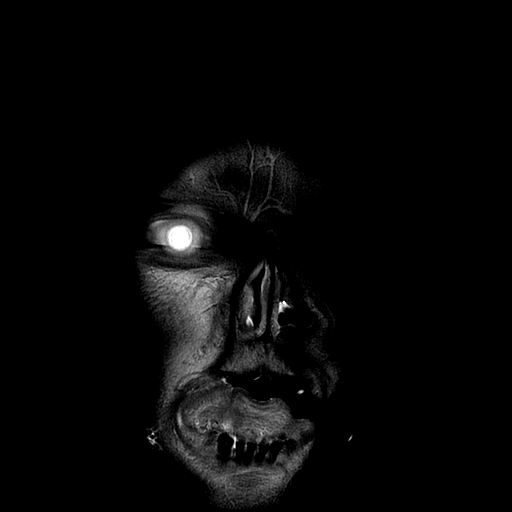

[Series 300: DWI · axial · 3.0mm · 1.09mm/px · z∈[-22,+116]mm · 4 of 48 slices shown (3 of 4)]
[im 1/48]
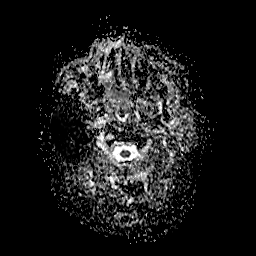
[im 16/48]
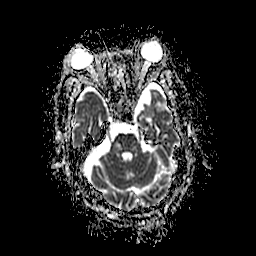
[im 32/48]
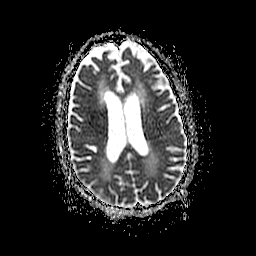
[im 48/48]
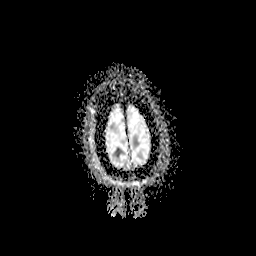

[Series 500: DWI · coronal · 5.0mm · 1.09mm/px · 3 of 40 slices shown (4 of 4)]
[im 1/40]
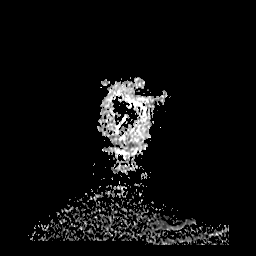
[im 20/40]
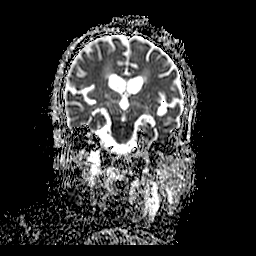
[im 40/40]
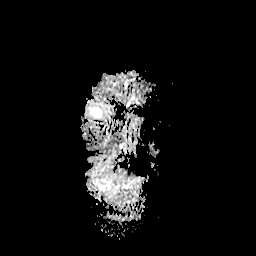

[33 of 48 positions shown; findings below may reference images not displayed]

FINDINGS: MRI HEAD FINDINGS

Brain: Restricted diffusion is again identified within the ventral
medulla bilaterally with slight increase in extent. Similar extent
of areas of additional restricted diffusion within the left
cerebellum and right posterior temporal lobe. A new punctate focus
of restricted diffusion is present within the inferior left
cerebellar hemisphere.

No evidence of new hemorrhage. Focus of chronic blood products again
identified within the right cerebellum. Patchy and confluent T2
hyperintensity in the supratentorial white likely reflects stable
chronic microvascular ischemic changes. Chronic small infarcts are
again noted within the cerebellum, brainstem, and thalami.
Ventricles are stable in size. There is no extra-axial fluid
collection.

Vascular: Normal flow voids.

Skull and upper cervical spine: Unremarkable.

Sinuses/Orbits: Paranasal sinus inflammatory changes. Bilateral lens
replacements.

Other: None.

MRA HEAD FINDINGS

There is persistent narrowing of the intracranial right vertebral
artery beyond the PICA origin. Stable small caliber of intracranial
left vertebral artery with patent left PICA. Stable diffuse
narrowing of the basilar artery with moderate to severe stenosis
particularly more proximally.

Bilateral posterior communicating arteries are patent. There is a
diminutive and likely significantly stenotic left P1 PCA. Posterior
cerebral arteries are patent.

Intracranial internal carotid arteries are patent with stable
atherosclerotic irregularity of the cavernous and proximal
supraclinoid portions. Middle and anterior cerebral arteries are
patent.
IMPRESSION: New punctate acute infarct of the inferior left cerebellum. Slight
increase in extent of evolving acute ventral medullary infarction.
Similar evolving acute left cerebellar and right posterior temporal
infarcts. No hemorrhage.

Posterior circulation atherosclerotic narrowing is similar in
appearance.

## 2019-04-11 IMAGING — MR MR MRA HEAD W/O CM
9 of 11 series · 33 of 48 positions shown · non-contrast
Comparison: [DATE]

CLINICAL DATA: Strokes post right vertebral artery angioplasty

EXAM:
MRI HEAD WITHOUT CONTRAST
MRA HEAD WITHOUT CONTRAST
TECHNIQUE: Multiplanar, multiecho pulse sequences of the brain and surrounding
structures were obtained without intravenous contrast. Angiographic
images of the head were obtained using MRA technique without
contrast.

[Series 3: DWI · axial · 3.0mm · 1.09mm/px · z∈[-22,+116]mm · 8 of 96 slices shown (1 of 4)]
[im 1/96]
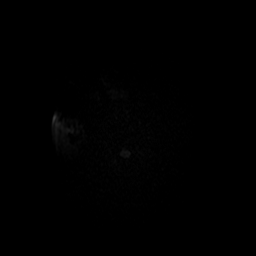
[im 14/96]
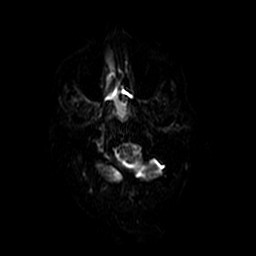
[im 28/96]
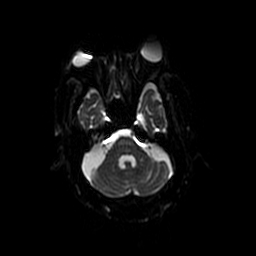
[im 41/96]
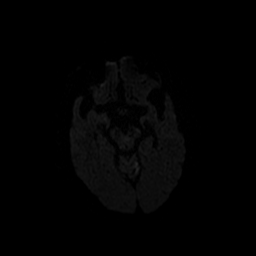
[im 55/96]
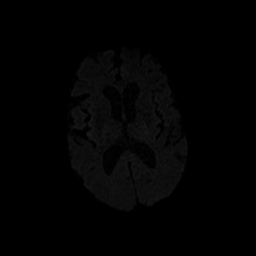
[im 68/96]
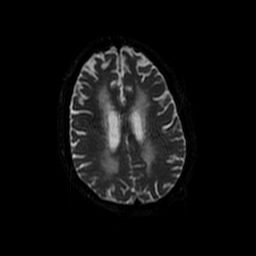
[im 82/96]
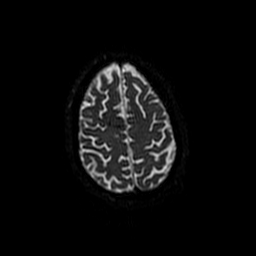
[im 96/96]
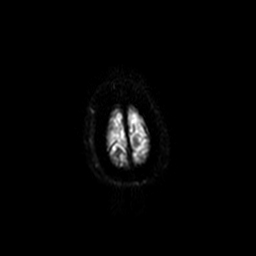

[Series 5: DWI · coronal · 5.0mm · 1.09mm/px · 6 of 80 slices shown (2 of 4)]
[im 1/80]
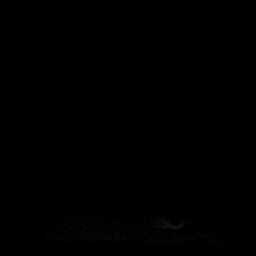
[im 16/80]
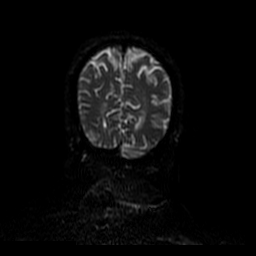
[im 32/80]
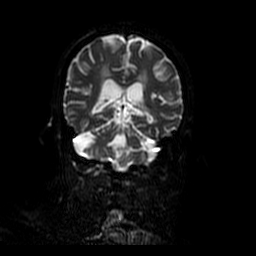
[im 48/80]
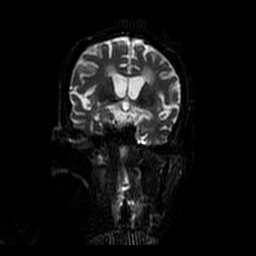
[im 64/80]
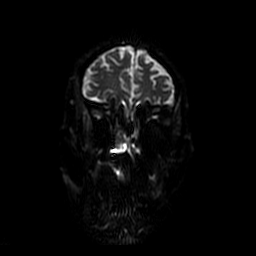
[im 80/80]
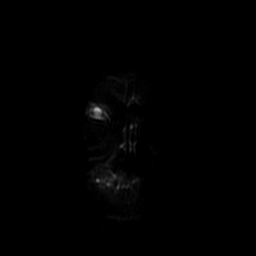

[Series 6: T2 · axial · 5.0mm · 0.43mm/px · z∈[-28,+107]mm · 2 of 24 slices shown (1 of 2)]
[im 1/24]
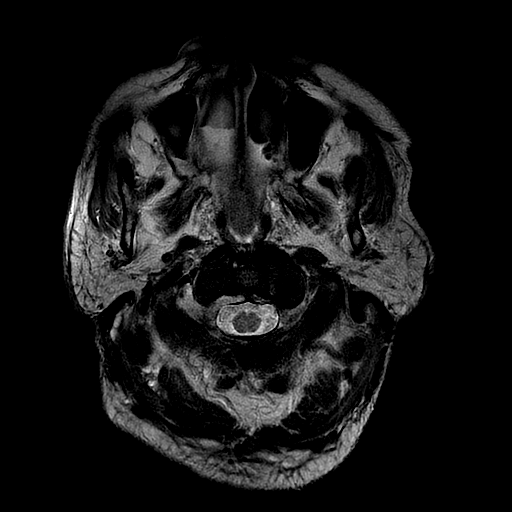
[im 24/24]
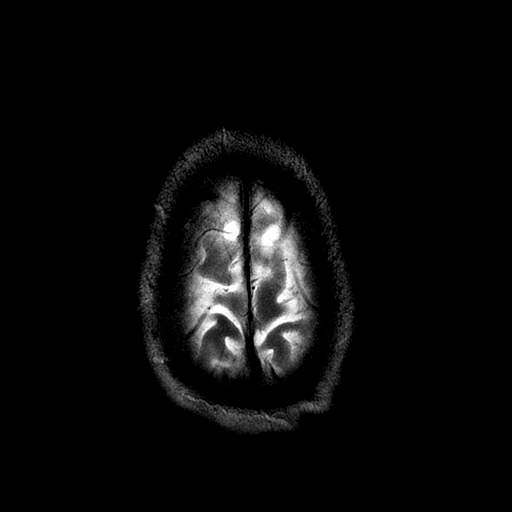

[Series 7: FLAIR · axial · 5.0mm · 0.43mm/px · z∈[-28,+107]mm · 2 of 24 slices shown]
[im 1/24]
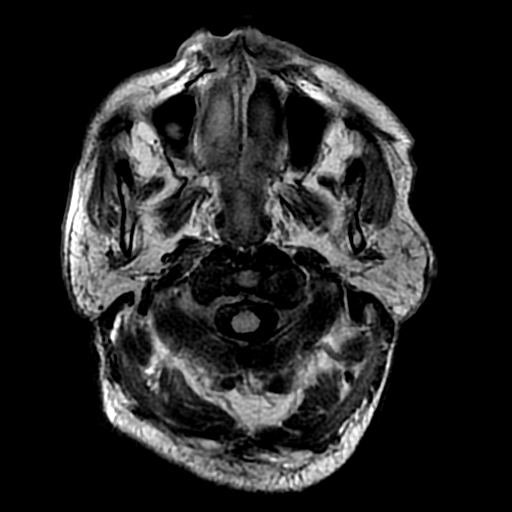
[im 24/24]
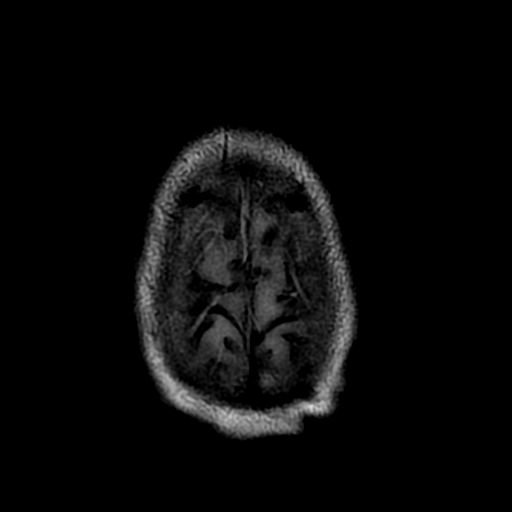

[Series 8: T1 · sagittal · 5.0mm · 0.47mm/px · 2 of 23 slices shown (1 of 2)]
[im 1/23]
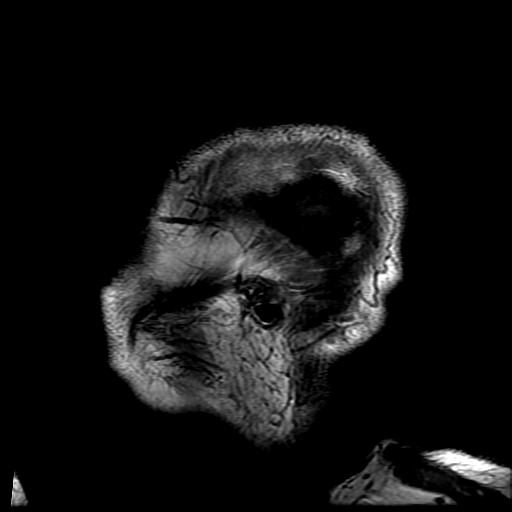
[im 23/23]
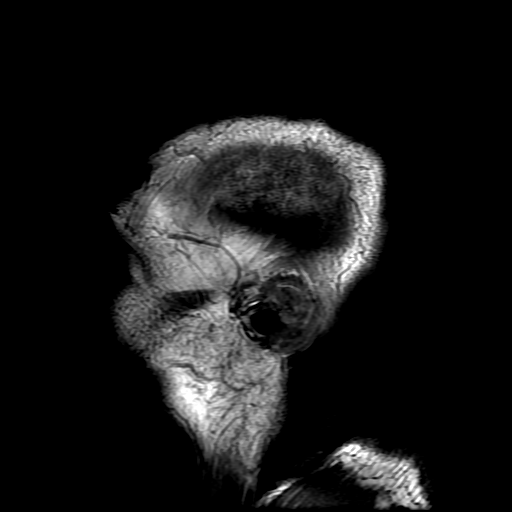

[Series 10: T1 · axial · 3.0mm · 0.47mm/px · z∈[-27,+42]mm · 4 of 96 slices shown (2 of 2)]
[im 1/96]
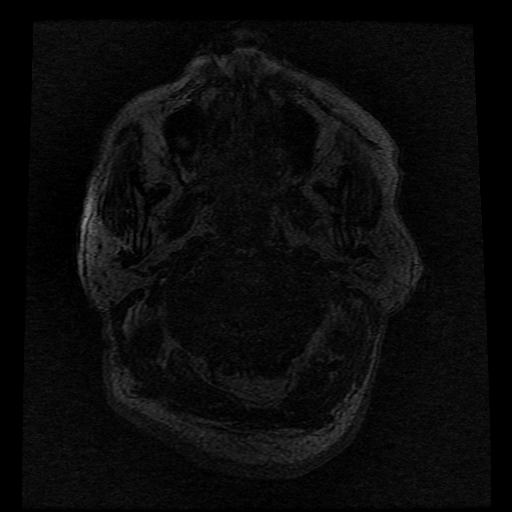
[im 16/96]
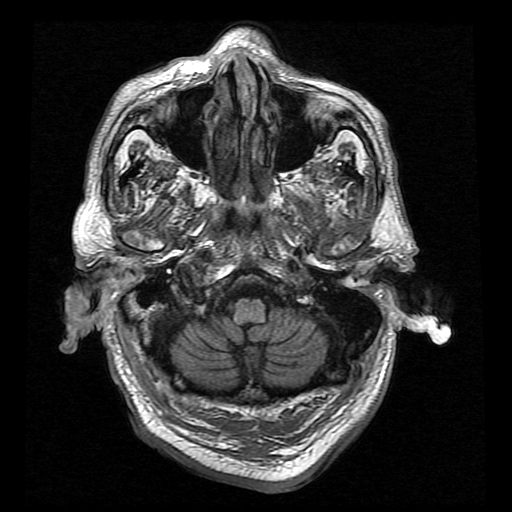
[im 32/96]
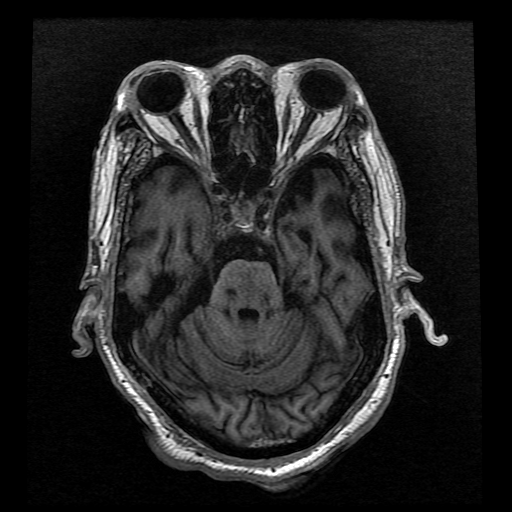
[im 48/96]
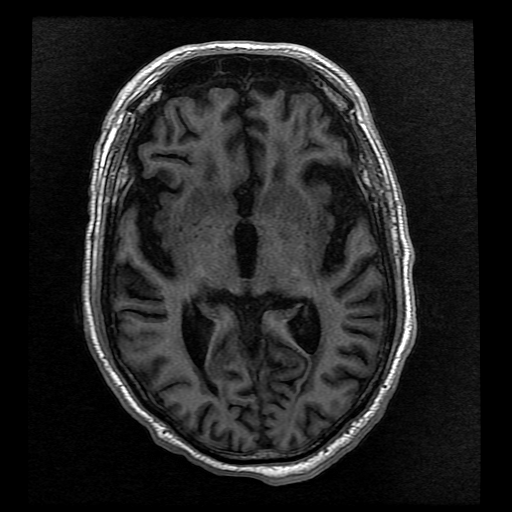

[Series 11: T2 · coronal · 5.0mm · 0.43mm/px · 2 of 32 slices shown (2 of 2)]
[im 1/32]
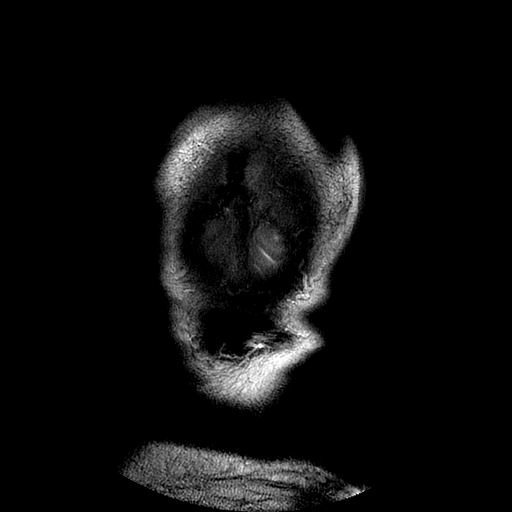
[im 32/32]
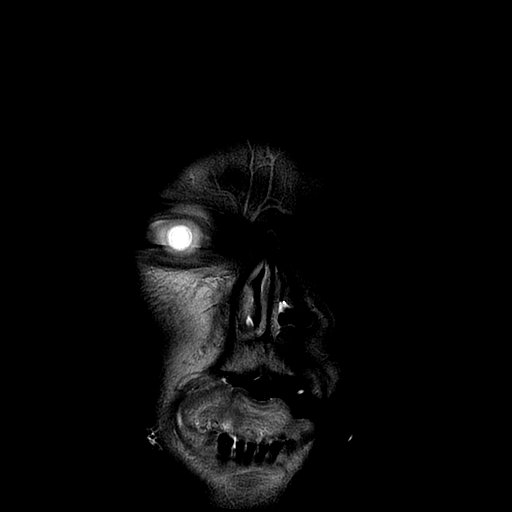

[Series 300: DWI · axial · 3.0mm · 1.09mm/px · z∈[-22,+116]mm · 4 of 48 slices shown (3 of 4)]
[im 1/48]
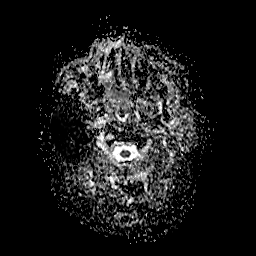
[im 16/48]
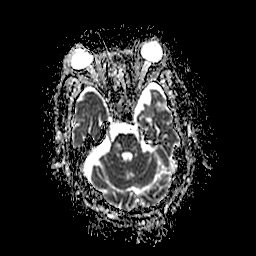
[im 32/48]
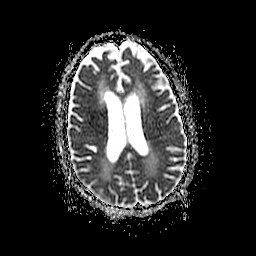
[im 48/48]
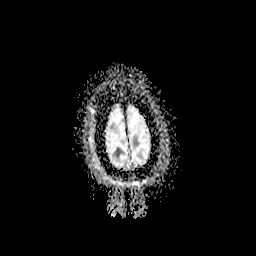

[Series 500: DWI · coronal · 5.0mm · 1.09mm/px · 3 of 40 slices shown (4 of 4)]
[im 1/40]
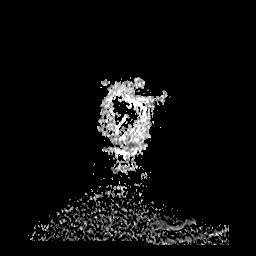
[im 20/40]
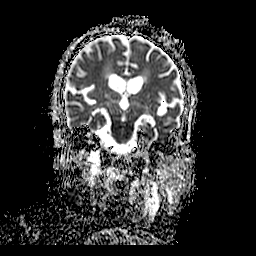
[im 40/40]
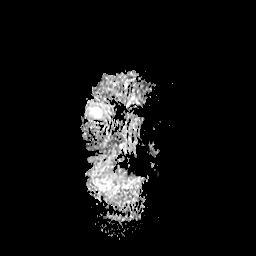

[33 of 48 positions shown; findings below may reference images not displayed]

FINDINGS: MRI HEAD FINDINGS

Brain: Restricted diffusion is again identified within the ventral
medulla bilaterally with slight increase in extent. Similar extent
of areas of additional restricted diffusion within the left
cerebellum and right posterior temporal lobe. A new punctate focus
of restricted diffusion is present within the inferior left
cerebellar hemisphere.

No evidence of new hemorrhage. Focus of chronic blood products again
identified within the right cerebellum. Patchy and confluent T2
hyperintensity in the supratentorial white likely reflects stable
chronic microvascular ischemic changes. Chronic small infarcts are
again noted within the cerebellum, brainstem, and thalami.
Ventricles are stable in size. There is no extra-axial fluid
collection.

Vascular: Normal flow voids.

Skull and upper cervical spine: Unremarkable.

Sinuses/Orbits: Paranasal sinus inflammatory changes. Bilateral lens
replacements.

Other: None.

MRA HEAD FINDINGS

There is persistent narrowing of the intracranial right vertebral
artery beyond the PICA origin. Stable small caliber of intracranial
left vertebral artery with patent left PICA. Stable diffuse
narrowing of the basilar artery with moderate to severe stenosis
particularly more proximally.

Bilateral posterior communicating arteries are patent. There is a
diminutive and likely significantly stenotic left P1 PCA. Posterior
cerebral arteries are patent.

Intracranial internal carotid arteries are patent with stable
atherosclerotic irregularity of the cavernous and proximal
supraclinoid portions. Middle and anterior cerebral arteries are
patent.
IMPRESSION: New punctate acute infarct of the inferior left cerebellum. Slight
increase in extent of evolving acute ventral medullary infarction.
Similar evolving acute left cerebellar and right posterior temporal
infarcts. No hemorrhage.

Posterior circulation atherosclerotic narrowing is similar in
appearance.

## 2019-04-11 IMAGING — DX DG CHEST 1V PORT
1 series · 1 of 1 positions shown · non-contrast
Comparison: [DATE]

CLINICAL DATA: Hypoxia

EXAM:
PORTABLE CHEST 1 VIEW

[chest ap]
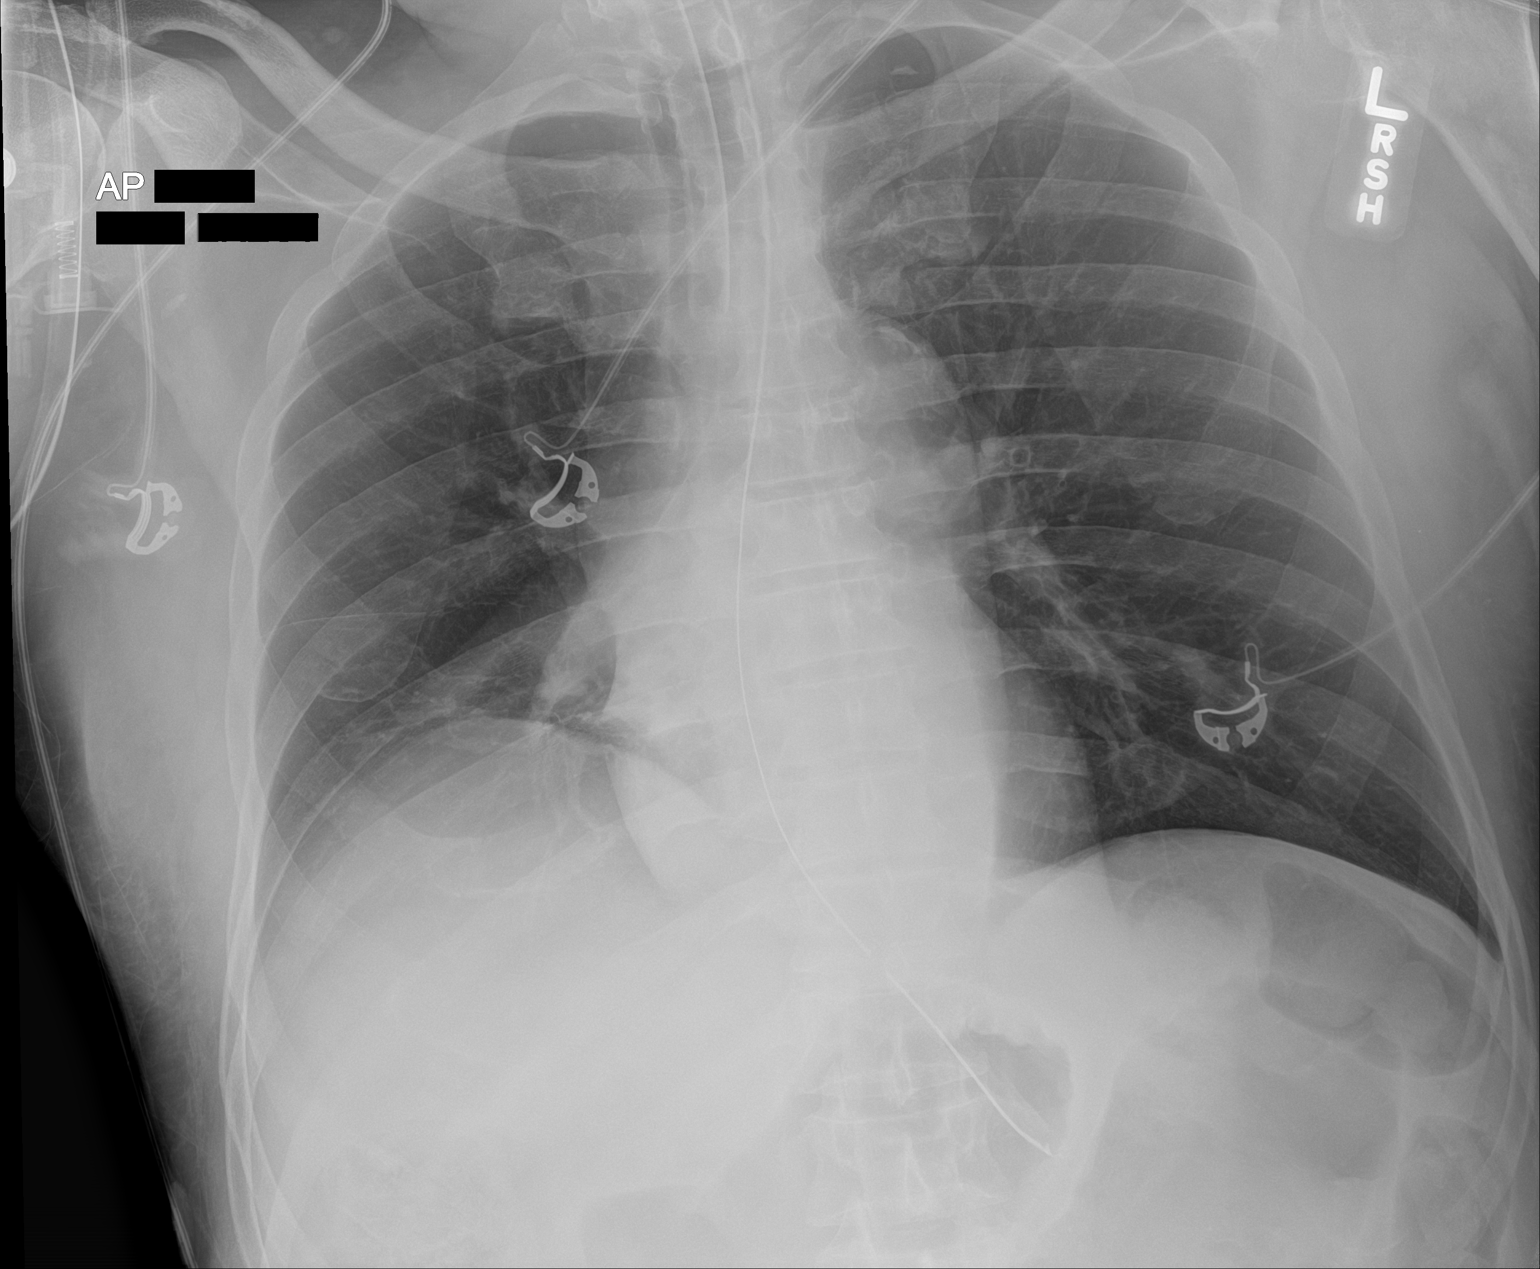

[1 of 1 positions shown; findings below may reference images not displayed]

FINDINGS: Endotracheal tube tip is 3.8 cm above the carina. Nasogastric tube
tip is in the proximal body of the stomach with the side port at the
gastroesophageal junction. No pneumothorax. No edema or
consolidation. Heart size and pulmonary vascularity are normal. No
adenopathy. There is aortic atherosclerosis. No bone lesions.
IMPRESSION: Tube positions as described without pneumothorax. Note that the
nasogastric tube side port is at the gastroesophageal junction.
Advise advancing nasogastric tube 4-5 cm.

Lungs clear. Heart size normal. Aortic Atherosclerosis
([ZX]-[ZX]).

## 2019-04-11 MED ORDER — CHLORHEXIDINE GLUCONATE 0.12% ORAL RINSE (MEDLINE KIT)
15.0000 mL | Freq: Two times a day (BID) | OROMUCOSAL | Status: DC
  Administered 2019-04-11 – 2019-04-13 (×5): 15 mL via OROMUCOSAL

## 2019-04-11 MED ORDER — ETOMIDATE 2 MG/ML IV SOLN
10.0000 mg | Freq: Once | INTRAVENOUS | Status: AC
  Administered 2019-04-11: 10 mg via INTRAVENOUS

## 2019-04-11 MED ORDER — IOHEXOL 300 MG/ML  SOLN
150.0000 mL | Freq: Once | INTRAMUSCULAR | Status: AC | PRN
  Administered 2019-04-11: 50 mL via INTRA_ARTERIAL

## 2019-04-11 MED ORDER — ATORVASTATIN CALCIUM 80 MG PO TABS
80.0000 mg | ORAL_TABLET | Freq: Every day | ORAL | Status: DC
  Administered 2019-04-11 – 2019-04-12 (×2): 80 mg
  Filled 2019-04-11 (×2): qty 1

## 2019-04-11 MED ORDER — DOCUSATE SODIUM 50 MG/5ML PO LIQD: 100.0000 mg | Freq: Two times a day (BID) | ORAL | Status: DC | PRN

## 2019-04-11 MED ORDER — MIDAZOLAM HCL 2 MG/2ML IJ SOLN: 1.0000 mg | INTRAMUSCULAR | Status: DC | PRN

## 2019-04-11 MED ORDER — TICAGRELOR 90 MG PO TABS
90.0000 mg | ORAL_TABLET | Freq: Two times a day (BID) | ORAL | Status: DC
  Administered 2019-04-12 – 2019-04-13 (×2): 90 mg
  Filled 2019-04-11 (×2): qty 1

## 2019-04-11 MED ORDER — INSULIN ASPART 100 UNIT/ML ~~LOC~~ SOLN
2.0000 [IU] | SUBCUTANEOUS | Status: DC
  Administered 2019-04-11: 4 [IU] via SUBCUTANEOUS
  Administered 2019-04-11: 6 [IU] via SUBCUTANEOUS
  Administered 2019-04-11: 4 [IU] via SUBCUTANEOUS
  Administered 2019-04-12: 2 [IU] via SUBCUTANEOUS
  Administered 2019-04-12: 4 [IU] via SUBCUTANEOUS
  Administered 2019-04-12: 2 [IU] via SUBCUTANEOUS
  Administered 2019-04-12 (×2): 4 [IU] via SUBCUTANEOUS
  Administered 2019-04-12: 04:00:00 2 [IU] via SUBCUTANEOUS
  Administered 2019-04-13 (×2): 6 [IU] via SUBCUTANEOUS

## 2019-04-11 MED ORDER — TICAGRELOR 90 MG PO TABS: 90.0000 mg | ORAL_TABLET | Freq: Two times a day (BID) | ORAL | Status: DC

## 2019-04-11 MED ORDER — TICAGRELOR 90 MG PO TABS
90.0000 mg | ORAL_TABLET | Freq: Two times a day (BID) | ORAL | Status: DC
  Administered 2019-04-11: 90 mg
  Filled 2019-04-11: qty 1

## 2019-04-11 MED ORDER — CLEVIDIPINE BUTYRATE 0.5 MG/ML IV EMUL
0.0000 mg/h | INTRAVENOUS | Status: AC
  Administered 2019-04-11 (×3): 2 mg/h via INTRAVENOUS
  Filled 2019-04-11 (×2): qty 50

## 2019-04-11 MED ORDER — ACETAMINOPHEN 325 MG PO TABS: 650.0000 mg | ORAL_TABLET | ORAL | Status: DC | PRN

## 2019-04-11 MED ORDER — SUGAMMADEX SODIUM 200 MG/2ML IV SOLN
INTRAVENOUS | Status: DC | PRN
  Administered 2019-04-11: 200 mg via INTRAVENOUS

## 2019-04-11 MED ORDER — ASPIRIN 81 MG PO CHEW
81.0000 mg | CHEWABLE_TABLET | Freq: Every day | ORAL | Status: DC
  Administered 2019-04-11 – 2019-04-13 (×3): 81 mg
  Filled 2019-04-11 (×3): qty 1

## 2019-04-11 MED ORDER — ROCURONIUM BROMIDE 50 MG/5ML IV SOLN
80.0000 mg | Freq: Once | INTRAVENOUS | Status: AC
  Administered 2019-04-11: 80 mg via INTRAVENOUS

## 2019-04-11 MED ORDER — INSULIN ASPART 100 UNIT/ML ~~LOC~~ SOLN
8.0000 [IU] | Freq: Once | SUBCUTANEOUS | Status: AC
  Administered 2019-04-11: 8 [IU] via SUBCUTANEOUS

## 2019-04-11 MED ORDER — MIDAZOLAM HCL 2 MG/2ML IJ SOLN
INTRAMUSCULAR | Status: AC
  Administered 2019-04-11: 2 mg
  Filled 2019-04-11: qty 2

## 2019-04-11 MED ORDER — ASPIRIN 81 MG PO CHEW: 81.0000 mg | CHEWABLE_TABLET | Freq: Every day | ORAL | Status: DC

## 2019-04-11 MED ORDER — SODIUM CHLORIDE 0.9 % IV SOLN
INTRAVENOUS | Status: DC
  Administered 2019-04-11 – 2019-04-12 (×3): via INTRAVENOUS

## 2019-04-11 MED ORDER — ACETAMINOPHEN 650 MG RE SUPP: 650.0000 mg | RECTAL | Status: DC | PRN

## 2019-04-11 MED ORDER — ONDANSETRON HCL 4 MG/2ML IJ SOLN
INTRAMUSCULAR | Status: DC | PRN
  Administered 2019-04-11: 4 mg via INTRAVENOUS

## 2019-04-11 MED ORDER — IOHEXOL 300 MG/ML  SOLN
150.0000 mL | Freq: Once | INTRAMUSCULAR | Status: AC | PRN
  Administered 2019-04-11: 60 mL via INTRA_ARTERIAL

## 2019-04-11 MED ORDER — SODIUM CHLORIDE 0.9 % IV BOLUS
1000.0000 mL | Freq: Once | INTRAVENOUS | Status: AC
  Administered 2019-04-11: 1000 mL via INTRAVENOUS

## 2019-04-11 MED ORDER — NOREPINEPHRINE 4 MG/250ML-% IV SOLN
2.0000 ug/min | INTRAVENOUS | Status: DC
  Administered 2019-04-11: 4 ug/min via INTRAVENOUS
  Filled 2019-04-11: qty 250

## 2019-04-11 MED ORDER — CLEVIDIPINE BUTYRATE 0.5 MG/ML IV EMUL
INTRAVENOUS | Status: DC | PRN
  Administered 2019-04-11: 2 mg/h via INTRAVENOUS

## 2019-04-11 MED ORDER — BISACODYL 10 MG RE SUPP: 10.0000 mg | Freq: Every day | RECTAL | Status: DC | PRN

## 2019-04-11 MED ORDER — ORAL CARE MOUTH RINSE
15.0000 mL | OROMUCOSAL | Status: DC
  Administered 2019-04-11 – 2019-04-13 (×21): 15 mL via OROMUCOSAL

## 2019-04-11 MED ORDER — IOHEXOL 300 MG/ML  SOLN
150.0000 mL | Freq: Once | INTRAMUSCULAR | Status: AC | PRN
  Administered 2019-04-11: 01:00:00 60 mL via INTRA_ARTERIAL

## 2019-04-11 MED ORDER — FENTANYL CITRATE (PF) 100 MCG/2ML IJ SOLN
25.0000 ug | INTRAMUSCULAR | Status: DC | PRN
  Administered 2019-04-11 – 2019-04-13 (×3): 50 ug via INTRAVENOUS
  Administered 2019-04-13 (×2): 100 ug via INTRAVENOUS
  Filled 2019-04-11 (×6): qty 2

## 2019-04-11 MED ORDER — FENTANYL CITRATE (PF) 100 MCG/2ML IJ SOLN
INTRAMUSCULAR | Status: AC
  Administered 2019-04-11: 100 ug
  Filled 2019-04-11: qty 2

## 2019-04-11 MED ORDER — ACETAMINOPHEN 160 MG/5ML PO SOLN: 650.0000 mg | ORAL | Status: DC | PRN

## 2019-04-11 MED ORDER — PANTOPRAZOLE SODIUM 40 MG PO PACK
40.0000 mg | PACK | ORAL | Status: DC
  Administered 2019-04-11 – 2019-04-13 (×3): 40 mg
  Filled 2019-04-11 (×3): qty 20

## 2019-04-11 MED ORDER — CLEVIDIPINE BUTYRATE 0.5 MG/ML IV EMUL
INTRAVENOUS | Status: AC
  Filled 2019-04-11: qty 50

## 2019-04-11 MED ORDER — SODIUM CHLORIDE 0.9 % IV SOLN: 250.0000 mL | INTRAVENOUS | Status: DC

## 2019-04-11 NOTE — Progress Notes (Signed)
STROKE TEAM PROGRESS NOTE   INTERVAL HISTORY Wife at bedside, as well as Therapist, sports. We reviewed MRI and MRA with wife at bedside. Pt able to open eyes briefly with voice but not following any commands or tracking. No movement of all extremities. Hypothermia now on warming blanket. Off neo and sedation. Only on NS. Pt had hemoptysis pre and post intubation, still has bloody suctioning. Will hold off brilinta for 24 hours. Continue ASA. Case discussed with Dr. Halford Chessman and Dr. Estanislado Pandy.    Vitals:   04/11/19 1209 04/11/19 1222 04/11/19 1230 04/11/19 1245  BP:  110/62 118/71 (!) 153/78  Pulse:    88  Resp:  17 (!) 22 (!) 22  Temp:  (!) 94.5 F (34.7 C)    TempSrc:  Rectal    SpO2: 100%   96%  Weight:      Height:        CBC:  Recent Labs  Lab 04/15/2019 2020  04/11/19 0425 04/11/19 0835  WBC 6.8  --  8.4  --   NEUTROABS 4.5  --  6.1  --   HGB 11.9*   < > 10.3* 8.8*  HCT 35.8*   < > 31.1* 26.0*  MCV 83.1  --  82.5  --   PLT 232  --  195  --    < > = values in this interval not displayed.    Basic Metabolic Panel:  Recent Labs  Lab 04/29/2019 2020  04/11/19 0047 04/11/19 0425 04/11/19 0835  NA 135   < > 143 138 141  K 4.4   < > 4.1 4.5 4.9  CL 107   < > 113* 114*  --   CO2 19*  --   --  17*  --   GLUCOSE 176*   < > 154* 212*  --   BUN 33*   < > 29* 33*  --   CREATININE 2.08*   < > 1.80* 2.05*  --   CALCIUM 9.1  --   --  8.1*  --    < > = values in this interval not displayed.   Lipid Panel:     Component Value Date/Time   CHOL 194 04/11/2019 0426   TRIG 45 04/11/2019 0426   HDL 33 (L) 04/11/2019 0426   CHOLHDL 5.9 04/11/2019 0426   VLDL 9 04/11/2019 0426   LDLCALC 152 (H) 04/11/2019 0426   HgbA1c:  Lab Results  Component Value Date   HGBA1C 9.0 (H) 04/11/2019   Urine Drug Screen: No results found for: LABOPIA, COCAINSCRNUR, LABBENZ, AMPHETMU, THCU, LABBARB  Alcohol Level No results found for: ETH  IMAGING Ct Head Wo Contrast  Result Date: 04/06/2019 CLINICAL DATA:   Follow-up examination for acute stroke. EXAM: CT HEAD WITHOUT CONTRAST TECHNIQUE: Contiguous axial images were obtained from the base of the skull through the vertex without intravenous contrast. COMPARISON:  Prior MRI from earlier the same day. FINDINGS: Brain: Previously identified small acute ischemic infarcts involving the ventral medulla, left cerebellum, and right perisylvian cortex are grossly stable, better seen on recent MRI. No evidence for hemorrhagic transformation or other complication. No other acute large vessel territory infarct. No intracranial hemorrhage. Atrophy with chronic microvascular ischemic disease again noted. Superimposed remote lacunar infarcts seen involving the left basal ganglia and bilateral thalami. No mass lesion or midline shift. No hydrocephalus. No extra-axial fluid collection. Vascular: No hyperdense vessel. Calcified atherosclerosis at the skull base. Skull: Scalp soft tissues and calvarium within normal limits. Sinuses/Orbits: Globes  and orbital soft tissues within normal limits. Paranasal sinuses remain largely clear. No significant mastoid effusion. Other: None. IMPRESSION: 1. Grossly stable small acute ischemic infarcts involving the ventral medulla, left cerebellum, and right perisylvian cortex. No evidence for hemorrhagic transformation or other complication. 2. No other new acute intracranial abnormality. Electronically Signed   By: Jeannine Boga M.D.   On: 04/23/2019 21:49   Ct Head Wo Contrast  Result Date: 04/12/2019 CLINICAL DATA:  Generalized weakness EXAM: CT HEAD WITHOUT CONTRAST TECHNIQUE: Contiguous axial images were obtained from the base of the skull through the vertex without intravenous contrast. COMPARISON:  02/21/2019 head CT FINDINGS: Brain: No acute territorial infarction, hemorrhage or intracranial mass. Atrophy and moderate small vessel ischemic changes of the white matter. Chronic lacunar infarcts in the bilateral basal ganglia and  thalamus. Stable ventricle size Vascular: No hyperdense vessels.  Carotid vascular calcification Skull: Normal. Negative for fracture or focal lesion. Sinuses/Orbits: No acute finding. Other: None IMPRESSION: 1. No CT evidence for acute intracranial abnormality. 2. Atrophy and small vessel ischemic changes of the white matter. Chronic appearing infarcts in the basal ganglia and thalamus Electronically Signed   By: Donavan Foil M.D.   On: 04/17/2019 21:57   Mr Angio Head Wo Contrast  Result Date: 04/11/2019 CLINICAL DATA:  Strokes post right vertebral artery angioplasty EXAM: MRI HEAD WITHOUT CONTRAST MRA HEAD WITHOUT CONTRAST TECHNIQUE: Multiplanar, multiecho pulse sequences of the brain and surrounding structures were obtained without intravenous contrast. Angiographic images of the head were obtained using MRA technique without contrast. COMPARISON:  April 10, 2019 FINDINGS: MRI HEAD FINDINGS Brain: Restricted diffusion is again identified within the ventral medulla bilaterally with slight increase in extent. Similar extent of areas of additional restricted diffusion within the left cerebellum and right posterior temporal lobe. A new punctate focus of restricted diffusion is present within the inferior left cerebellar hemisphere. No evidence of new hemorrhage. Focus of chronic blood products again identified within the right cerebellum. Patchy and confluent T2 hyperintensity in the supratentorial white likely reflects stable chronic microvascular ischemic changes. Chronic small infarcts are again noted within the cerebellum, brainstem, and thalami. Ventricles are stable in size. There is no extra-axial fluid collection. Vascular: Normal flow voids. Skull and upper cervical spine: Unremarkable. Sinuses/Orbits: Paranasal sinus inflammatory changes. Bilateral lens replacements. Other: None. MRA HEAD FINDINGS There is persistent narrowing of the intracranial right vertebral artery beyond the PICA origin. Stable  small caliber of intracranial left vertebral artery with patent left PICA. 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 Bilateral posterior communicating arteries are patent. There is a diminutive and likely significantly stenotic left P1 PCA. Posterior cerebral arteries are patent. Intracranial internal carotid arteries are patent with stable atherosclerotic irregularity of the cavernous and proximal supraclinoid portions. Middle and anterior cerebral arteries are patent. IMPRESSION: New punctate acute infarct of the inferior left cerebellum. Slight increase in extent of evolving acute ventral medullary infarction. Similar evolving acute left cerebellar and right posterior temporal infarcts. No hemorrhage. Posterior circulation atherosclerotic narrowing is similar in appearance. Electronically Signed   By: Macy Mis M.D.   On: 04/11/2019 12:44   Mr Angio Head Wo Contrast  Result Date: 05/04/2019 CLINICAL DATA:  Speech difficulty EXAM: MRA HEAD WITHOUT CONTRAST TECHNIQUE: Angiographic images of the Circle of Willis were obtained using MRA technique without intravenous contrast. COMPARISON:  Brain MRI from earlier today FINDINGS: Dominant right V4 segment with high-grade narrowing past the PICA. Much of left  vertebral flow is into the left PICA which is patent on source images. Moderate to advanced proximal basilar stenosis. Fetal type flow in both posterior cerebral arteries. The left upper PCA branch is highly stenotic. Mild narrowing at the left P3 segment. Atherosclerotic irregularity along both carotid siphons with subjective moderate narrowing at the paraclinoid segment on the left. No branch occlusion, beading, or aneurysm. IMPRESSION: Intracranial atherosclerosis most notable in the posterior circulation where there is high-grade stenosis of the dominant right V4 segment and moderate to advanced narrowing of the proximal basilar.  Electronically Signed   By: Monte Fantasia M.D.   On: 04/17/2019 06:28   Mr Brain Wo Contrast  Result Date: 04/11/2019 CLINICAL DATA:  Strokes post right vertebral artery angioplasty EXAM: MRI HEAD WITHOUT CONTRAST MRA HEAD WITHOUT CONTRAST TECHNIQUE: Multiplanar, multiecho pulse sequences of the brain and surrounding structures were obtained without intravenous contrast. Angiographic images of the head were obtained using MRA technique without contrast. COMPARISON:  April 10, 2019 FINDINGS: MRI HEAD FINDINGS Brain: Restricted diffusion is again identified within the ventral medulla bilaterally with slight increase in extent. Similar extent of areas of additional restricted diffusion within the left cerebellum and right posterior temporal lobe. A new punctate focus of restricted diffusion is present within the inferior left cerebellar hemisphere. No evidence of new hemorrhage. Focus of chronic blood products again identified within the right cerebellum. Patchy and confluent T2 hyperintensity in the supratentorial white likely reflects stable chronic microvascular ischemic changes. Chronic small infarcts are again noted within the cerebellum, brainstem, and thalami. Ventricles are stable in size. There is no extra-axial fluid collection. Vascular: Normal flow voids. Skull and upper cervical spine: Unremarkable. Sinuses/Orbits: Paranasal sinus inflammatory changes. Bilateral lens replacements. Other: None. MRA HEAD FINDINGS There is persistent narrowing of the intracranial right vertebral artery beyond the PICA origin. Stable small caliber of intracranial left vertebral artery with patent left PICA. 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 Bilateral posterior communicating arteries are patent. There is a diminutive and likely significantly stenotic left P1 PCA. Posterior cerebral arteries are patent. Intracranial internal carotid arteries are  patent with stable atherosclerotic irregularity of the cavernous and proximal supraclinoid portions. Middle and anterior cerebral arteries are patent. IMPRESSION: New punctate acute infarct of the inferior left cerebellum. Slight increase in extent of evolving acute ventral medullary infarction. Similar evolving acute left cerebellar and right posterior temporal infarcts. No hemorrhage. Posterior circulation atherosclerotic narrowing is similar in appearance. Electronically Signed   By: Macy Mis M.D.   On: 04/11/2019 12:44   Mr Brain Wo Contrast  Result Date: 04/19/2019 CLINICAL DATA:  Speech difficulty. EXAM: MRI HEAD WITHOUT CONTRAST TECHNIQUE: Multiplanar, multiecho pulse sequences of the brain and surrounding structures were obtained without intravenous contrast. COMPARISON:  Head CT from yesterday FINDINGS: Brain: Restricted diffusion on both sides of the ventral medulla, correlating with history of weakness. Although it is unusual for brainstem infarcts to be bilateral, presence of small left superior cerebellar and right posterior temporal infarcts are compatible with multifocal infarct. There is a background of chronic small vessel ischemia with remote infarcts in the bilateral cerebellum and brainstem and in the bilateral deep gray nuclei, especially thalamus. Confluent chronic small vessel ischemic gliosis in the deep cerebral white matter. Age normal brain volume. Chronic blood products in the right cerebellum considered post ischemic. No generalized chronic hemorrhagic injury. Vascular: Normal flow voids. Skull and upper cervical spine: Negative for marrow lesion. Sinuses/Orbits: Negative IMPRESSION:  1. Small acute infarcts in the bilateral ventral medulla, left superior cerebellum, and right perisylvian cortex. 2. 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 Electronically Signed   By: Monte Fantasia M.D.   On: 04/09/2019 05:54   Dg Chest Port 1 View  Result Date:  04/11/2019 CLINICAL DATA:  Hypoxia EXAM: PORTABLE CHEST 1 VIEW COMPARISON:  April 10, 2019 FINDINGS: Endotracheal tube tip is 3.8 cm above the carina. Nasogastric tube tip is in the proximal body of the stomach with the side port at the gastroesophageal junction. No pneumothorax. No edema or consolidation. Heart size and pulmonary vascularity are normal. No adenopathy. There is aortic atherosclerosis. No bone lesions. IMPRESSION: Tube positions as described without pneumothorax. Note that the nasogastric tube side port is at the gastroesophageal junction. Advise advancing nasogastric tube 4-5 cm. Lungs clear. Heart size normal. Aortic Atherosclerosis (ICD10-I70.0). Electronically Signed   By: Lowella Grip III M.D.   On: 04/11/2019 08:25   Dg Chest Port 1 View  Result Date: 04/09/2019 CLINICAL DATA:  Generalized weakness with slurred speech EXAM: PORTABLE CHEST 1 VIEW COMPARISON:  02/21/2019 FINDINGS: The heart size and mediastinal contours are within normal limits. Both lungs are clear. The visualized skeletal structures are unremarkable. IMPRESSION: No active disease. Electronically Signed   By: Monte Fantasia M.D.   On: 05/02/2019 04:18   Vas [place] Lower Extremity Venous (dvt)  Result Date: 04/30/2019  Lower Venous Study Indications: Stroke.  Comparison Study: no prior Performing Technologist: Abram Sander RVS  Examination Guidelines: A complete evaluation includes B-mode imaging, spectral Doppler, color Doppler, and power Doppler as needed of all accessible portions of each vessel. Bilateral testing is considered an integral part of a complete examination. Limited examinations for reoccurring indications may be performed as noted.  +---------+---------------+---------+-----------+----------+--------------+ RIGHT    CompressibilityPhasicitySpontaneityPropertiesThrombus Aging +---------+---------------+---------+-----------+----------+--------------+ CFV      Full           Yes      Yes                                  +---------+---------------+---------+-----------+----------+--------------+ SFJ      Full                                                        +---------+---------------+---------+-----------+----------+--------------+ FV Prox  Full                                                        +---------+---------------+---------+-----------+----------+--------------+ FV Mid   Full                                                        +---------+---------------+---------+-----------+----------+--------------+ FV DistalFull                                                        +---------+---------------+---------+-----------+----------+--------------+  PFV      Full                                                        +---------+---------------+---------+-----------+----------+--------------+ POP      Full           Yes      Yes                                 +---------+---------------+---------+-----------+----------+--------------+ PTV      Full                                                        +---------+---------------+---------+-----------+----------+--------------+ PERO     Full                                                        +---------+---------------+---------+-----------+----------+--------------+   +---------+---------------+---------+-----------+----------+--------------+ LEFT     CompressibilityPhasicitySpontaneityPropertiesThrombus Aging +---------+---------------+---------+-----------+----------+--------------+ CFV      Full           Yes      Yes                                 +---------+---------------+---------+-----------+----------+--------------+ SFJ      Full                                                        +---------+---------------+---------+-----------+----------+--------------+ FV Prox  Full                                                         +---------+---------------+---------+-----------+----------+--------------+ FV Mid   Full                                                        +---------+---------------+---------+-----------+----------+--------------+ FV DistalFull                                                        +---------+---------------+---------+-----------+----------+--------------+ PFV      Full                                                        +---------+---------------+---------+-----------+----------+--------------+  POP      Full           Yes      Yes                                 +---------+---------------+---------+-----------+----------+--------------+ PTV      Full                                                        +---------+---------------+---------+-----------+----------+--------------+ PERO     Full                                                        +---------+---------------+---------+-----------+----------+--------------+     Summary: Right: There is no evidence of deep vein thrombosis in the lower extremity. No cystic structure found in the popliteal fossa. Left: There is no evidence of deep vein thrombosis in the lower extremity. No cystic structure found in the popliteal fossa.  *See table(s) above for measurements and observations. Electronically signed by Monica Martinez MD on 05/05/2019 at 3:38:12 PM.    Final     PHYSICAL EXAM  Temp:  [90.3 F (32.4 C)-98.9 F (37.2 C)] 94.5 F (34.7 C) (11/06 1222) Pulse Rate:  [69-94] 88 (11/06 1245) Resp:  [9-27] 22 (11/06 1245) BP: (59-181)/(24-100) 153/78 (11/06 1245) SpO2:  [76 %-100 %] 96 % (11/06 1245) Arterial Line BP: (120-166)/(58-90) 166/90 (11/06 0400) FiO2 (%):  [70 %-100 %] 70 % (11/06 1209)  General - Well nourished, well developed, intubated off sedation.  Ophthalmologic - fundi not visualized.  Cardiovascular - Regular rate and rhythm.  Neuro - intubated off sedation and pressor, eyes  closed but briefly open with voice, not following commands. With forced eye opening, eyes in mid position, not blinking to visual threat, doll's eyes absent, not tracking, pupil bilaterally 41mm, not reactive to light. Corneal reflex absent, gag and cough present. Breathing not over the vent.  Facial symmetry not able to test due to ET tube.  Tongue protrusion not cooperative. On pain stimulation, no movement in all extremities. DTR diminished and no babinski. Sensation, coordination and gait not tested.   ASSESSMENT/PLAN Mr. Jermaine Mitchell is a 78 y.o. male with history of DM2, erectile dysfunction, HLD and HTN presenting with 4 days of difficulty ambulating with left sided weakness, followed by 2 days of dysarthria.   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 Small vessel disease. Atrophy. Old basal ganglia and thalamic infarcts.  MRI  Small B ventral medullary, left pontine, left cerebellar and R perisylvian cortex infarcts. Small vessel disease.  MRA  Intracranial atherosclerosis w/ high-grade R V4 stenosis and moderate to advanced narrowing proximal BA  LE venous Doppler  No DVT   2D Echo EF 60-65%. No source of embolus    Neuro worsening with quadriplegia and intelligible dysarthria -> Stat CT neg for hemorrhage  Cerebral  angio w/ Dr. Estanislado Pandy angioplasty mid cervical R VA w/ improved flow. Proximal R VA origin 50% stenosis. Proximal BA and R VBJ 75% stenosis.   MRI New punctate acute infarct of the inferior left cerebellum. Increase in extent of evolving acute ventral medullary infarction. Similar evolving acute left cerebellar and right posterior temporal infarcts.   LDL 109  HgbA1c 10.0  IV heparin for VTE prophylaxis  aspirin 81  mg daily prior to admission, now on aspirin 81 mg daily and bilinta (ticagrelor) 90 mg bid. Will hold off brilinta for 24 hours due to hemoptysis post intubation.    Therapy recommendations:  pending  Disposition:  pending   Acute Respiratory Failure Hemoptysis w/ concern for aspiration PNA  Intubated for IR, extubated, then desaturated, unable to protect airway  CCM consulted  Re-intubated  CXR lungs clear  On vent  Still has mild hemoptysis - hold off brilinta for 24 hours   Hypertension Hypotension post intubation  Stable BP goal 130-160 BP as low as 59/24 . Was on neo, now off   Hyperlipidemia  Home meds:  lipitor 80, resumed in hospital  LDL 109, goal < 70  Continue statin at discharge  Diabetes type II Uncontrolled  HgbA1c 10.0, goal < 7.0  CBGs  SSI  Hyperglycemia  Close PCP follow up  R groin hematoma s/p IR Acute blood loss Anemia post IR  Level 2  Pressure held  Hgb 12.9->10.3->8.8    CBC monitoring  Other Stroke Risk Factors  Advanced age  Former Cigarette smoker  Family hx stroke (mother, sister x 2)  Other Active Problems  CKD stage III, Cre 2.10->2.05  Hypothermia 90.3->91.1 - on warming blanket - likely due to medullary infarct   Hospital day # 1  This patient is critically ill due to brainstem infarct, posterior circulation occlusion / stenosis, respiratory failure, hypothermia and at significant risk of neurological worsening, death form recurrent stroke, cardiac arrest, seizure, heart failure. This patient's care requires constant monitoring of vital signs, hemodynamics, respiratory and cardiac monitoring, review of multiple databases, neurological assessment, discussion with family, other specialists and medical decision making of high complexity. I spent 40 minutes of neurocritical care time in the care of this patient. I had long discussion with wife at bedside, updated pt current condition, treatment plan and potential  prognosis, and answered all the questions. Wife expressed understanding and appreciation.     Rosalin Hawking, MD PhD Stroke Neurology 04/11/2019 2:03 PM    To contact Stroke Continuity provider, please refer to [email or web address] After hours, contact General Neurology

## 2019-04-11 NOTE — Anesthesia Postprocedure Evaluation (Signed)
Anesthesia Post Note  Patient: Harrol Placeres  Procedure(s) Performed: IR WITH ANESTHESIA (N/A )     Patient location during evaluation: PACU Anesthesia Type: General Level of consciousness: awake and alert Pain management: pain level controlled Vital Signs Assessment: post-procedure vital signs reviewed and stable Respiratory status: spontaneous breathing, nonlabored ventilation, respiratory function stable and patient connected to nasal cannula oxygen Cardiovascular status: blood pressure returned to baseline and stable Postop Assessment: no apparent nausea or vomiting Anesthetic complications: no    Last Vitals:  Vitals:   04/11/19 0730 04/11/19 0743  BP: (!) 157/71   Pulse: 87 87  Resp: (!) 27 20  Temp:    SpO2: 93% 90%    Last Pain:  Vitals:   04/11/19 0500  TempSrc: Rectal  PainSc:                  Tiajuana Amass
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
## 2019-04-11 NOTE — Progress Notes (Signed)
Upon arriving to 4N ICU from IR patient is mute, unable to follow commands and not moving extremities to pain. Patient does respond by moving his head when stimulated. Oral airway was removed and patient oxygen saturation has been 100%. The right groin access site has minimal bleeding but is currently a level 2 due to the hematoma/bruising  around the site. Constant Pressure has been held at the site and MD has been notified about hematoma. Site will continue to be monitored and leg should be kept straight until 0900.

## 2019-04-11 NOTE — Progress Notes (Signed)
Patient ID: Isaiha Drechsler, male   DOB: 1940-06-22, 78 y.o.   MRN: [id number] INR.Post procedure CT brain shows no ich or mass effect RT groin hemostasis achieved with manual compression.Distal pulses DP and PT dopplerable bilaterally. Extubated . Slow to awaken Pupils 2 to 3 mm sluggish. Grimaces to pain. Opens eyes to name. No motor movement in the limbs appreciated. S.Jamirah Zelaya MD

## 2019-04-11 NOTE — Transfer of Care (Signed)
Immediate Anesthesia Transfer of Care Note  Patient: Jermaine Mitchell  Procedure(s) Performed: IR WITH ANESTHESIA (N/A )  Patient Location: ICU  Anesthesia Type:General  Level of Consciousness: drowsy and responds to stimulation  Airway & Oxygen Therapy: Patient Spontanous Breathing and Patient connected to face mask oxygen  Post-op Assessment: Report given to RN and Post -op Vital signs reviewed and stable  Post vital signs: Reviewed and stable  Last Vitals:  Vitals Value Taken Time  BP 156/66 (ABP)   Temp    Pulse 76   Resp 14   SpO2 100    Report to [employer], VSS, patient currently has oral airway in place, connected to 8L O2 via facemask, grimaces to painful stimulation, tracks to voice, unable to follow commands at this time, VSS. Per Deveshwar MD to keep SBP < 138mmHg. Cleviprex gtt currently off. Transfer of patient care in stable position.   Last Pain:  Vitals:   04/19/2019 2012  TempSrc:   PainSc: 0-No pain      Patients Stated Pain Goal: 0 ([id number])  Complications: No apparent anesthesia complications

## 2019-04-11 NOTE — Procedures (Signed)
Intubation Procedure Note Locklan Canoy [id number] [birth year]  Procedure: Intubation Indications: Airway protection and maintenance  Procedure Details Consent: Risks of procedure as well as the alternatives and risks of each were explained to the (patient/caregiver).  Consent for procedure obtained. Time Out: Verified patient identification, verified procedure, site/side was marked, verified correct patient position, special equipment/implants available, medications/allergies/relevent history reviewed, required imaging and test results available.  Performed  Maximum sterile technique was used including gloves, hand hygiene and mask.  MAC and 4  Given 2 mg versed, 100 mcg fentanyl, 10 mg etomidate, 80 mg rocuronium.  Inserted ETT to 23 cm at lip.  Confirmed with CO2 detector and ausculation.  Evaluation Hemodynamic Status: Transient hypotension treated with pressors and fluid; O2 sats: transiently fell during during procedure Patient's Current Condition: stable Complications: No apparent complications Patient did tolerate procedure well. Chest X-ray ordered to verify placement.  CXR: tube position acceptable.   Tiffiny Worthy 04/11/2019

## 2019-04-11 NOTE — Progress Notes (Signed)
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 Estanislado Pandy. Patient laying in bed intubated with sedation- per RN patient was re-intubated this AM for airway protection. Does not respond to voice/painful stimuli and does not follow simple commands. Not moving extremities. Pupils equal and fixed. Right groin incision c/d/i.   Allergies: Lisinopril and Codeine  Medications: Prior to Admission medications   Medication Sig Start Date End Date Taking? Authorizing Provider  amLODipine (NORVASC) 10 MG tablet Take 1 tablet (10 mg total) by mouth daily. 01/16/19  Yes Mosie Lukes, MD  aspirin 81 MG EC tablet Take 81 mg by mouth daily. Swallow whole.   Yes [provider]  atorvastatin (LIPITOR) 80 MG tablet Take 1 tablet (80 mg total) by mouth daily with breakfast. 11/13/16  Yes Mosie Lukes, MD  carvedilol (COREG) 25 MG tablet Take 1 tablet (25 mg total) by mouth 2 (two) times daily with a meal. 01/16/19  Yes Mosie Lukes, MD  insulin NPH-regular Human (NOVOLIN 70/30 RELION) (70-30) 100 UNIT/ML injection 36 units with breakfast, and 13 units with evening meal, and syringes 2/day Patient taking differently: 10-30 Units See admin instructions. 30 units with breakfast, and 10 units with evening meal 12/16/18  Yes Renato Shin, MD  isosorbide mononitrate (IMDUR) 30 MG 24 hr tablet Take 1 tablet (30 mg total) by mouth daily. 01/08/19  Yes Nahser, Wonda Cheng, MD  polyethylene glycol (MIRALAX / GLYCOLAX) 17 g packet Take 17 g by mouth daily as needed for mild constipation. 02/23/19  Yes [name], MD  senna (SENOKOT) 8.6 MG TABS tablet Take 1 tablet (8.6 mg total) by mouth 2 (two) times daily. 02/23/19  Yes Oretha Milch D, MD  torsemide  (DEMADEX) 20 MG tablet Take 2 tablets (40 mg total) by mouth daily. 01/08/19  Yes Nahser, Wonda Cheng, MD  ACCU-CHEK [facility] LANCETS lancets Use as directed once daily to check blood sugar. DX E11.9 11/13/16   Mosie Lukes, MD  Alcohol Swabs (B-D SINGLE USE SWABS REGULAR) PADS Use as directed daily to check blood sugar.  DX E11.9 11/13/16   Mosie Lukes, MD  glucose blood (ACCU-CHEK AVIVA PLUS) test strip 1 each by Other route 2 (two) times daily. And lancets 2/day 07/26/16   Renato Shin, MD     Vital Signs: BP (!) 138/91    Pulse 79    Temp (!) 91.1 F (32.8 C) (Rectal)    Resp 20    Ht 5\' 9"  (1.753 m)    Wt 167 lb 8.8 oz (76 kg)    SpO2 100%    BMI 24.74 kg/m   Physical Exam Vitals signs and nursing note reviewed.  Constitutional:      General: He is not in acute distress.    Comments: Intubated with sedation.  Pulmonary:     Effort: Pulmonary effort is normal. No respiratory distress.     Comments: Intubated with sedation. Skin:    General: Skin is warm and dry.     Comments: Right groin incision soft without active bleeding or hematoma.  Neurological:     Comments: Intubated with sedation. Does not respond to voice/painful stimuli and does not follow simple commands. Pupils equal and fixed. Not moving extremities. Distal pulses palpable bilaterally with Doppler.  Psychiatric:  Comments: Intubated with sedation.     Imaging: Ct Head Wo Contrast  Result Date: 04/26/2019 CLINICAL DATA:  Follow-up examination for acute stroke. EXAM: CT HEAD WITHOUT CONTRAST TECHNIQUE: Contiguous axial images were obtained from the base of the skull through the vertex without intravenous contrast. COMPARISON:  Prior MRI from earlier the same day. FINDINGS: Brain: Previously identified small acute ischemic infarcts involving the ventral medulla, left cerebellum, and right perisylvian cortex are grossly stable, better seen on recent MRI. No evidence for hemorrhagic transformation or other  complication. No other acute large vessel territory infarct. No intracranial hemorrhage. Atrophy with chronic microvascular ischemic disease again noted. Superimposed remote lacunar infarcts seen involving the left basal ganglia and bilateral thalami. No mass lesion or midline shift. No hydrocephalus. No extra-axial fluid collection. Vascular: No hyperdense vessel. Calcified atherosclerosis at the skull base. Skull: Scalp soft tissues and calvarium within normal limits. Sinuses/Orbits: Globes and orbital soft tissues within normal limits. Paranasal sinuses remain largely clear. No significant mastoid effusion. Other: None. IMPRESSION: 1. Grossly stable small acute ischemic infarcts involving the ventral medulla, left cerebellum, and right perisylvian cortex. No evidence for hemorrhagic transformation or other complication. 2. No other new acute intracranial abnormality. Electronically Signed   By: Jeannine Boga M.D.   On: 04/29/2019 21:49   Ct Head Wo Contrast  Result Date: 04/24/2019 CLINICAL DATA:  Generalized weakness EXAM: CT HEAD WITHOUT CONTRAST TECHNIQUE: Contiguous axial images were obtained from the base of the skull through the vertex without intravenous contrast. COMPARISON:  02/21/2019 head CT FINDINGS: Brain: No acute territorial infarction, hemorrhage or intracranial mass. Atrophy and moderate small vessel ischemic changes of the white matter. Chronic lacunar infarcts in the bilateral basal ganglia and thalamus. Stable ventricle size Vascular: No hyperdense vessels.  Carotid vascular calcification Skull: Normal. Negative for fracture or focal lesion. Sinuses/Orbits: No acute finding. Other: None IMPRESSION: 1. No CT evidence for acute intracranial abnormality. 2. Atrophy and small vessel ischemic changes of the white matter. Chronic appearing infarcts in the basal ganglia and thalamus Electronically Signed   By: Donavan Foil M.D.   On: 04/30/2019 21:57   Mr Angio Head Wo  Contrast  Result Date: 04/17/2019 CLINICAL DATA:  Speech difficulty EXAM: MRA HEAD WITHOUT CONTRAST TECHNIQUE: Angiographic images of the Circle of Willis were obtained using MRA technique without intravenous contrast. COMPARISON:  Brain MRI from earlier today FINDINGS: Dominant right V4 segment with high-grade narrowing past the PICA. Much of left vertebral flow is into the left PICA which is patent on source images. Moderate to advanced proximal basilar stenosis. Fetal type flow in both posterior cerebral arteries. The left upper PCA branch is highly stenotic. Mild narrowing at the left P3 segment. Atherosclerotic irregularity along both carotid siphons with subjective moderate narrowing at the paraclinoid segment on the left. No branch occlusion, beading, or aneurysm. IMPRESSION: Intracranial atherosclerosis most notable in the posterior circulation where there is high-grade stenosis of the dominant right V4 segment and moderate to advanced narrowing of the proximal basilar. Electronically Signed   By: Monte Fantasia M.D.   On: 04/21/2019 06:28   Mr Brain Wo Contrast  Result Date: 04/23/2019 CLINICAL DATA:  Speech difficulty. EXAM: MRI HEAD WITHOUT CONTRAST TECHNIQUE: Multiplanar, multiecho pulse sequences of the brain and surrounding structures were obtained without intravenous contrast. COMPARISON:  Head CT from yesterday FINDINGS: Brain: Restricted diffusion on both sides of the ventral medulla, correlating with history of weakness. Although it is unusual for brainstem infarcts to be bilateral, presence of  small left superior cerebellar and right posterior temporal infarcts are compatible with multifocal infarct. There is a background of chronic small vessel ischemia with remote infarcts in the bilateral cerebellum and brainstem and in the bilateral deep gray nuclei, especially thalamus. Confluent chronic small vessel ischemic gliosis in the deep cerebral white matter. Age normal brain volume. Chronic  blood products in the right cerebellum considered post ischemic. No generalized chronic hemorrhagic injury. Vascular: Normal flow voids. Skull and upper cervical spine: Negative for marrow lesion. Sinuses/Orbits: Negative IMPRESSION: 1. Small acute infarcts in the bilateral ventral medulla, left superior cerebellum, and right perisylvian cortex. 2. 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 Electronically Signed   By: Monte Fantasia M.D.   On: 04/30/2019 05:54   Dg Chest Port 1 View  Result Date: 04/11/2019 CLINICAL DATA:  Hypoxia EXAM: PORTABLE CHEST 1 VIEW COMPARISON:  April 10, 2019 FINDINGS: Endotracheal tube tip is 3.8 cm above the carina. Nasogastric tube tip is in the proximal body of the stomach with the side port at the gastroesophageal junction. No pneumothorax. No edema or consolidation. Heart size and pulmonary vascularity are normal. No adenopathy. There is aortic atherosclerosis. No bone lesions. IMPRESSION: Tube positions as described without pneumothorax. Note that the nasogastric tube side port is at the gastroesophageal junction. Advise advancing nasogastric tube 4-5 cm. Lungs clear. Heart size normal. Aortic Atherosclerosis (ICD10-I70.0). Electronically Signed   By: Lowella Grip III M.D.   On: 04/11/2019 08:25   Dg Chest Port 1 View  Result Date: 04/09/2019 CLINICAL DATA:  Generalized weakness with slurred speech EXAM: PORTABLE CHEST 1 VIEW COMPARISON:  02/21/2019 FINDINGS: The heart size and mediastinal contours are within normal limits. Both lungs are clear. The visualized skeletal structures are unremarkable. IMPRESSION: No active disease. Electronically Signed   By: Monte Fantasia M.D.   On: 04/26/2019 04:18   Vas [place] Lower Extremity Venous (dvt)  Result Date: 04/25/2019  Lower Venous Study Indications: Stroke.  Comparison Study: no prior Performing Technologist: Abram Sander RVS  Examination Guidelines: A complete evaluation includes B-mode imaging, spectral  Doppler, color Doppler, and power Doppler as needed of all accessible portions of each vessel. Bilateral testing is considered an integral part of a complete examination. Limited examinations for reoccurring indications may be performed as noted.  +---------+---------------+---------+-----------+----------+--------------+  RIGHT     Compressibility Phasicity Spontaneity Properties Thrombus Aging  +---------+---------------+---------+-----------+----------+--------------+  CFV       Full            Yes       Yes                                    +---------+---------------+---------+-----------+----------+--------------+  SFJ       Full                                                             +---------+---------------+---------+-----------+----------+--------------+  FV Prox   Full                                                             +---------+---------------+---------+-----------+----------+--------------+  FV Mid    Full                                                             +---------+---------------+---------+-----------+----------+--------------+  FV Distal Full                                                             +---------+---------------+---------+-----------+----------+--------------+  PFV       Full                                                             +---------+---------------+---------+-----------+----------+--------------+  POP       Full            Yes       Yes                                    +---------+---------------+---------+-----------+----------+--------------+  PTV       Full                                                             +---------+---------------+---------+-----------+----------+--------------+  PERO      Full                                                             +---------+---------------+---------+-----------+----------+--------------+   +---------+---------------+---------+-----------+----------+--------------+  LEFT       Compressibility Phasicity Spontaneity Properties Thrombus Aging  +---------+---------------+---------+-----------+----------+--------------+  CFV       Full            Yes       Yes                                    +---------+---------------+---------+-----------+----------+--------------+  SFJ       Full                                                             +---------+---------------+---------+-----------+----------+--------------+  FV Prox   Full                                                             +---------+---------------+---------+-----------+----------+--------------+  FV Mid    Full                                                             +---------+---------------+---------+-----------+----------+--------------+  FV Distal Full                                                             +---------+---------------+---------+-----------+----------+--------------+  PFV       Full                                                             +---------+---------------+---------+-----------+----------+--------------+  POP       Full            Yes       Yes                                    +---------+---------------+---------+-----------+----------+--------------+  PTV       Full                                                             +---------+---------------+---------+-----------+----------+--------------+  PERO      Full                                                             +---------+---------------+---------+-----------+----------+--------------+     Summary: Right: There is no evidence of deep vein thrombosis in the lower extremity. No cystic structure found in the popliteal fossa. Left: There is no evidence of deep vein thrombosis in the lower extremity. No cystic structure found in the popliteal fossa.  *See table(s) above for measurements and observations. Electronically signed by Monica Martinez MD on 05/01/2019 at 3:38:12 PM.    Final     Labs:  CBC: Recent Labs     02/21/19 1438 02/22/19 0233 04/14/2019 2020 05/01/2019 2101 04/11/19 0425 04/11/19 0835  WBC 15.6* 13.8* 6.8  --  8.4  --   HGB 10.3* 10.7* 11.9* 12.9* 10.3* 8.8*  HCT 31.6* 32.9* 35.8* 38.0* 31.1* 26.0*  PLT 294 264 232  --  195  --     COAGS: Recent Labs    04/18/2019 2020  INR 1.1  APTT 35    BMP: Recent Labs    02/21/19 1438 02/22/19 0233 04/17/2019 2020 04/28/2019 2101 04/11/19 0425 04/11/19 0835  NA 134* 138 135 139 138 141  K 3.9 3.9 4.4 4.5 4.5 4.9  CL 103 109 107 107  114*  --   CO2 21* 19* 19*  --  17*  --   GLUCOSE 222* 264* 176* 174* 212*  --   BUN 62* 60* 33* 36* 33*  --   CALCIUM 8.7* 8.6* 9.1  --  8.1*  --   CREATININE 3.34* 2.83* 2.08* 2.10* 2.05*  --   GFRNONAA 17* 21* 30*  --  31*  --   GFRAA 20* 24* 35*  --  35*  --     LIVER FUNCTION TESTS: Recent Labs    01/12/19 2357 02/21/19 1438 02/22/19 0233 04/16/2019 2020  BILITOT 0.7 0.6 0.4 0.6  AST 17 19 20 16   ALT 14 12 15 18   ALKPHOS 77 75 76 83  PROT 8.0 7.5 7.4 7.9  ALBUMIN 3.5 2.7* 2.8* 3.2*    Assessment and Plan:  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 Estanislado Pandy. Patient was re-intubated this AM for airway protection (developed hypothermia and hypoxia, then post-intubation noted to have hypotension and hemoptysis)- plan for MRI/MRA brain/head today for further assessment. Right groin incision stable, distal pulses palpable bilaterally with Doppler. Continue taking Brilinta 90 mg twice daily and Aspirin 81 mg once daily. Further plans per CCM/TRH/neurology- appreciate and agree with management. NIR to follow.   Electronically Signed: Earley Abide, PA-C 04/11/2019, 10:02 AM   I spent a total of 25 Minutes at the the patient's bedside AND on the patient's hospital floor or unit, greater than 50% of which was counseling/coordinating care for mid cervical right VA preocclusive stenosis s/p revascularization.

## 2019-04-11 NOTE — Progress Notes (Signed)
Patient has been desatting on 15 L O2 [place], now with POX to 93% on NRB. Appears tenuous. Appears imminently to be unable to protect airway. Also with bloody, frothy oral secretions.   STAT CCM consult has been requested.   Electronically signed: Dr. Kerney Elbe

## 2019-04-11 NOTE — Progress Notes (Signed)
Fi02 dropped to 40% and PEEP dropped to 10.  Pt tolerating well

## 2019-04-11 NOTE — Progress Notes (Signed)
Initial Nutrition Assessment  RD working remotely.  DOCUMENTATION CODES:   Not applicable  INTERVENTION:   Recommend obtaining enteral access and initiating tube feeding: - Vital AF 1.2 @ 50 ml/hr (1200 ml/day) - Pro-stat 30 ml daily  Tube feeding regimen provides 1540 kcal, 105 grams of protein, and 973 ml of H2O.   Tube feeding regimen and current cleviprex provides 1732 total kcal (103% of needs).  NUTRITION DIAGNOSIS:   Inadequate oral intake related to inability to eat as evidenced by NPO status.  GOAL:   Patient will meet greater than or equal to 90% of their needs  MONITOR:   Vent status, Labs, Weight trends, Skin, I & O's  REASON FOR ASSESSMENT:   Ventilator    ASSESSMENT:   78 year old male who presented to the ED on 11/04 with blurred vision, slurred speech, and LUE weakness. PMH of T2DM, HLD, HTN, CHF, anemia. MRI revealed small acute infarcts bilateral ventral medulla, left superior cerebellum, and right perisylvian cortex. MRA revealedhigh-grade stenosis of the dominant right V4 segment and moderate to advanced narrowing of the proximal basilar.   11/06 - s/p emergent diagnostic angiogram, balloon angioplasty, extubated post-procedure, then reintubated  Pt noted to have hemoptysis and hypotension after intubation.  Unable to obtain diet and weight history at this time. Reviewed weight history in chart. Weight appears stable over the last year.  No documented enteral access at this time.  RD will leave tube feeding recommendations.  Patient is currently intubated on ventilator support MV: 9.5 L/min Temp (24hrs), Avg:94.7 F (34.8 C), Min:90.3 F (32.4 C), Max:98.9 F (37.2 C) BP (cuff): 133/91 MAP (cuff): 102  Drips: Cleviprex: 4 ml/hr (provides 192 kcal daily from lipid) Levophed: 15 ml/hr NS: 75 ml/hr  Medications reviewed and include: SSI, Protonix  Labs reviewed: BUN 33, creatinine 2.05, HDL 33, LDL 152, hemoglobin 8.8 CBG's: 174-293 x  24 hours  UOP: 1500 ml x 24 hours  NUTRITION - FOCUSED PHYSICAL EXAM:  Unable to complete at this time. RD working remotely.  Diet Order:   Diet Order            Diet NPO time specified  Diet effective now              EDUCATION NEEDS:   No education needs have been identified at this time  Skin:  Skin Assessment: Skin Integrity Issues: Skin Integrity Issues: Incisions: groin  Last BM:  04/24/2019  Height:   Ht Readings from Last 1 Encounters:  05/01/2019 5\' 9"  (1.753 m)    Weight:   Wt Readings from Last 1 Encounters:  04/16/2019 76 kg    Ideal Body Weight:  72.7 kg  BMI:  Body mass index is 24.74 kg/m.  Estimated Nutritional Needs:   Kcal:  1688  Protein:  95-115 grams  Fluid:  >/= 1.6 L    Gaynell Face, MS, RD, LDN Inpatient Clinical Dietitian Pager: [phone] Weekend/After Hours: [phone]

## 2019-04-11 NOTE — Progress Notes (Signed)
Patient ID: Jermaine Mitchell, male   DOB: Apr 07, 1941, 78 y.o.   MRN: [id number] INR. 86 Y RT H M with worsening neuro function despite being on IV heparin Admitted with  Small brain stem infracts. Presently with an NIH ss of 21.,almost tetraparesis.. CT brain No ICH. Emergent  diagnostic arteriogram  And endovascular revascularization of diseased post fossa circulation D/W patient by Dr Charna Archer and his spouse and daughter by me and DR Cheral Marker. Procedure,reasons ,risks of ICH of 10 % worsening neuro deficit ,death reviewed with family. Spouse expressed understanding and provided consent to proceed. S.Adena Sima MD

## 2019-04-11 NOTE — Procedures (Signed)
S/P RT CCA and RT VA angiograms followed by balloon angioplasty of preocclusive mid  Cervical RT VA  With improved flow into the RT VBJ and basilar artery and retrogradely into the Lt VBJ Approx 50 % stenosis of prox RT VA at origin. Approx 75 % stenosis of prox basilar artery and rt RT VBJ distal to RT PICA S.Dalvin Clipper MD

## 2019-04-12 ENCOUNTER — Inpatient Hospital Stay (HOSPITAL_COMMUNITY): Payer: Medicare HMO

## 2019-04-12 ENCOUNTER — Encounter (HOSPITAL_COMMUNITY): Payer: Self-pay | Admitting: Physician Assistant

## 2019-04-12 LAB — BASIC METABOLIC PANEL
Anion gap: 12 (ref 5–15)
Anion gap: 10 (ref 5–15)
BUN: 45 mg/dL — ABNORMAL HIGH (ref 8–23)
BUN: 55 mg/dL — ABNORMAL HIGH (ref 8–23)
CO2: 13 mmol/L — ABNORMAL LOW (ref 22–32)
CO2: 14 mmol/L — ABNORMAL LOW (ref 22–32)
Calcium: 8.2 mg/dL — ABNORMAL LOW (ref 8.9–10.3)
Calcium: 8 mg/dL — ABNORMAL LOW (ref 8.9–10.3)
Chloride: 115 mmol/L — ABNORMAL HIGH (ref 98–111)
Chloride: 117 mmol/L — ABNORMAL HIGH (ref 98–111)
Creatinine, Ser: 3.42 mg/dL — ABNORMAL HIGH (ref 0.61–1.24)
Creatinine, Ser: 3.98 mg/dL — ABNORMAL HIGH (ref 0.61–1.24)
GFR calc Af Amer: 19 mL/min — ABNORMAL LOW (ref >60)
GFR calc Af Amer: 16 mL/min — ABNORMAL LOW (ref >60)
GFR calc non Af Amer: 16 mL/min — ABNORMAL LOW (ref >60)
GFR calc non Af Amer: 14 mL/min — ABNORMAL LOW (ref >60)
Glucose, Bld: 141 mg/dL — ABNORMAL HIGH (ref 70–99)
Glucose, Bld: 199 mg/dL — ABNORMAL HIGH (ref 70–99)
Potassium: 4.5 mmol/L (ref 3.5–5.1)
Potassium: 4.4 mmol/L (ref 3.5–5.1)
Sodium: 140 mmol/L (ref 135–145)
Sodium: 141 mmol/L (ref 135–145)

## 2019-04-12 LAB — POCT I-STAT 7, (LYTES, BLD GAS, ICA,H+H)
Acid-base deficit: 10 mmol/L — ABNORMAL HIGH (ref 0.0–2.0)
Acid-base deficit: 9 mmol/L — ABNORMAL HIGH (ref 0.0–2.0)
Bicarbonate: 13.4 mmol/L — ABNORMAL LOW (ref 20.0–28.0)
Bicarbonate: 14.9 mmol/L — ABNORMAL LOW (ref 20.0–28.0)
Calcium, Ion: 1.17 mmol/L (ref 1.15–1.40)
Calcium, Ion: 1.2 mmol/L (ref 1.15–1.40)
HCT: 22 % — ABNORMAL LOW (ref 39.0–52.0)
HCT: 22 % — ABNORMAL LOW (ref 39.0–52.0)
Hemoglobin: 7.5 g/dL — ABNORMAL LOW (ref 13.0–17.0)
Hemoglobin: 7.5 g/dL — ABNORMAL LOW (ref 13.0–17.0)
O2 Saturation: 100 %
O2 Saturation: 100 %
Potassium: 4.4 mmol/L (ref 3.5–5.1)
Potassium: 4.6 mmol/L (ref 3.5–5.1)
Sodium: 143 mmol/L (ref 135–145)
Sodium: 143 mmol/L (ref 135–145)
TCO2: 14 mmol/L — ABNORMAL LOW (ref 22–32)
TCO2: 16 mmol/L — ABNORMAL LOW (ref 22–32)
pCO2 arterial: 20 mmHg — ABNORMAL LOW (ref 32.0–48.0)
pCO2 arterial: 24.6 mmHg — ABNORMAL LOW (ref 32.0–48.0)
pH, Arterial: 7.434 (ref 7.350–7.450)
pH, Arterial: 7.389 (ref 7.350–7.450)
pO2, Arterial: 185 mmHg — ABNORMAL HIGH (ref 83.0–108.0)
pO2, Arterial: 200 mmHg — ABNORMAL HIGH (ref 83.0–108.0)

## 2019-04-12 LAB — APTT: aPTT: 37 seconds — ABNORMAL HIGH (ref 24–36)

## 2019-04-12 LAB — GLUCOSE, CAPILLARY
Glucose-Capillary: 130 mg/dL — ABNORMAL HIGH (ref 70–99)
Glucose-Capillary: 138 mg/dL — ABNORMAL HIGH (ref 70–99)
Glucose-Capillary: 166 mg/dL — ABNORMAL HIGH (ref 70–99)
Glucose-Capillary: 180 mg/dL — ABNORMAL HIGH (ref 70–99)
Glucose-Capillary: 188 mg/dL — ABNORMAL HIGH (ref 70–99)
Glucose-Capillary: 235 mg/dL — ABNORMAL HIGH (ref 70–99)

## 2019-04-12 LAB — MAGNESIUM
Magnesium: 1.7 mg/dL (ref 1.7–2.4)
Magnesium: 1.7 mg/dL (ref 1.7–2.4)

## 2019-04-12 LAB — CBC
HCT: 25.8 % — ABNORMAL LOW (ref 39.0–52.0)
Hemoglobin: 8.6 g/dL — ABNORMAL LOW (ref 13.0–17.0)
MCH: 27.6 pg (ref 26.0–34.0)
MCHC: 33.3 g/dL (ref 30.0–36.0)
MCV: 82.7 fL (ref 80.0–100.0)
Platelets: 193 10*3/uL (ref 150–400)
RBC: 3.12 MIL/uL — ABNORMAL LOW (ref 4.22–5.81)
RDW: 15.7 % — ABNORMAL HIGH (ref 11.5–15.5)
WBC: 11.5 10*3/uL — ABNORMAL HIGH (ref 4.0–10.5)
nRBC: 0 % (ref 0.0–0.2)

## 2019-04-12 LAB — PROTIME-INR
INR: 1.2 (ref 0.8–1.2)
Prothrombin Time: 15.3 seconds — ABNORMAL HIGH (ref 11.4–15.2)

## 2019-04-12 LAB — TRIGLYCERIDES: Triglycerides: 94 mg/dL (ref <150)

## 2019-04-12 LAB — PHOSPHORUS
Phosphorus: 3.5 mg/dL (ref 2.5–4.6)
Phosphorus: 3.8 mg/dL (ref 2.5–4.6)

## 2019-04-12 IMAGING — CT CT IMAGE GUIDED DRAINAGE BY PERCUTANEOUS CATHETER
1 of 2 series · 15 of 32 positions shown, 19 images · non-contrast
Comparison: None.

CLINICAL DATA: Urethral obstruction, urinary retention, bladder
distension.

EXAM:
CT GUIDED SUPRAPUBIC CATHETER PLACEMENT

[Series 2: i-spiral 5.0 b40f · axial · 0.62mm/px · z∈[+956,+1162]mm · 15 of 65 slices shown, 19 images]
[im 3/65  soft-tissue]
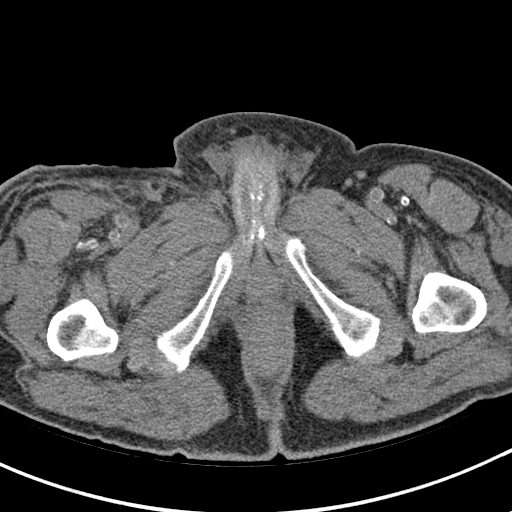
[im 3/65  bone]
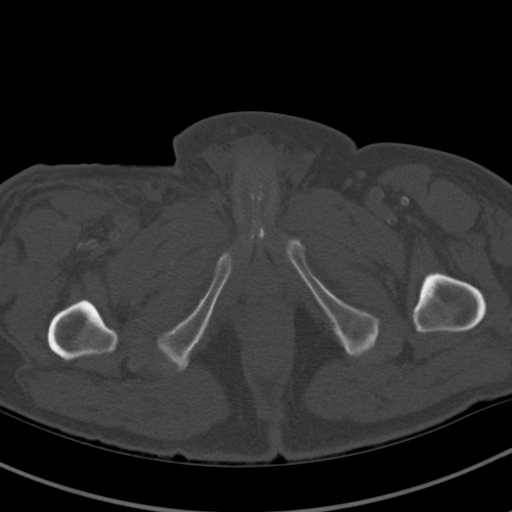
[im 8/65  soft-tissue]
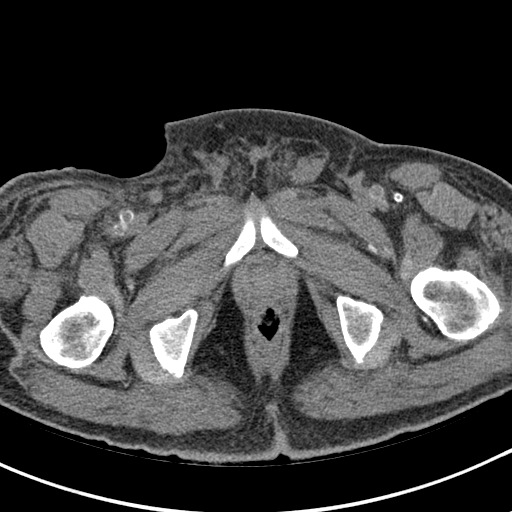
[im 13/65  soft-tissue]
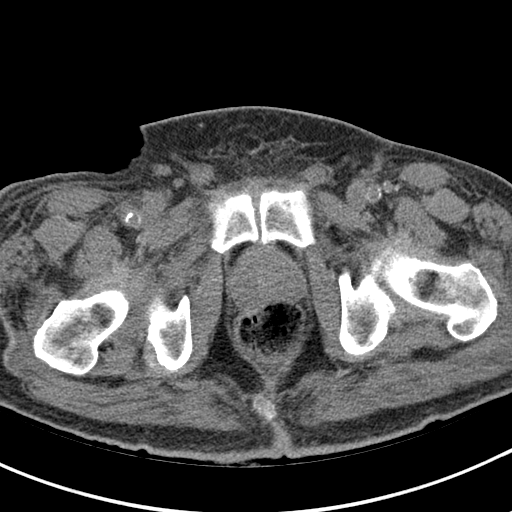
[im 18/65  soft-tissue]
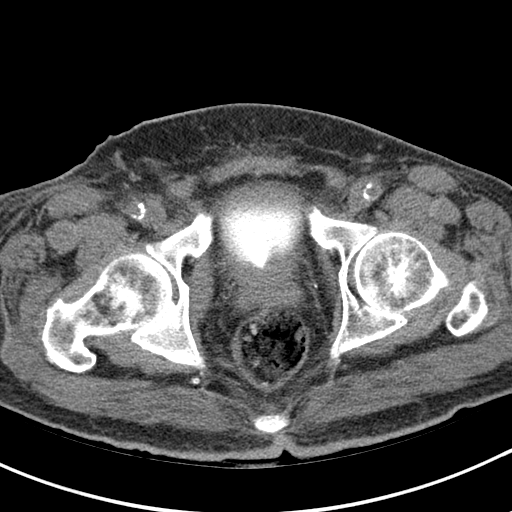
[im 24/65  soft-tissue]
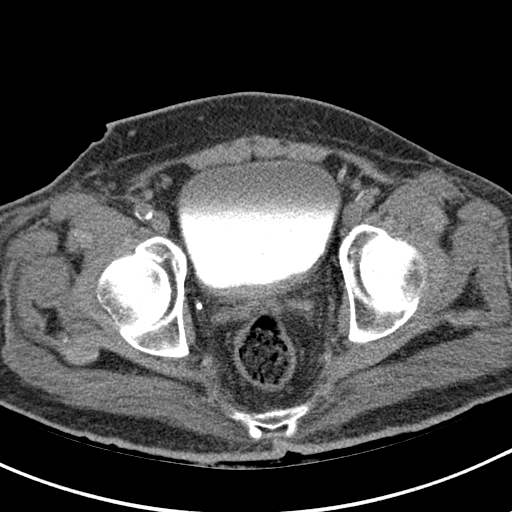
[im 29/65  soft-tissue]
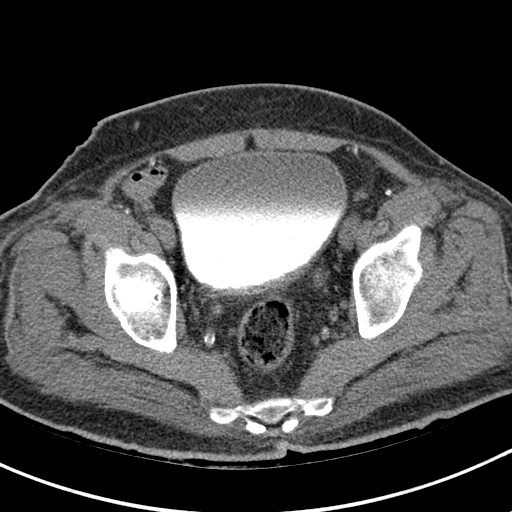
[im 34/65  soft-tissue]
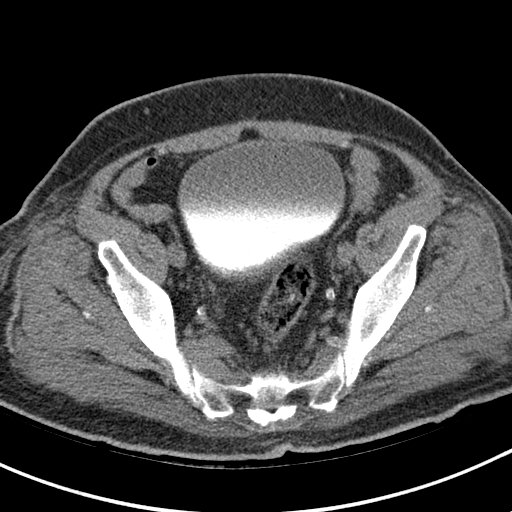
[im 36/65  soft-tissue]
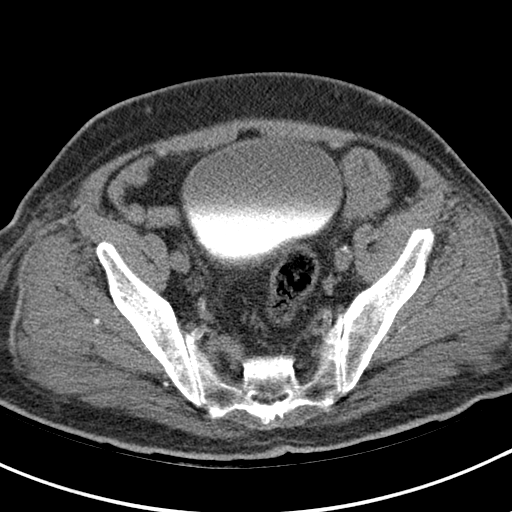
[im 41/65  soft-tissue]
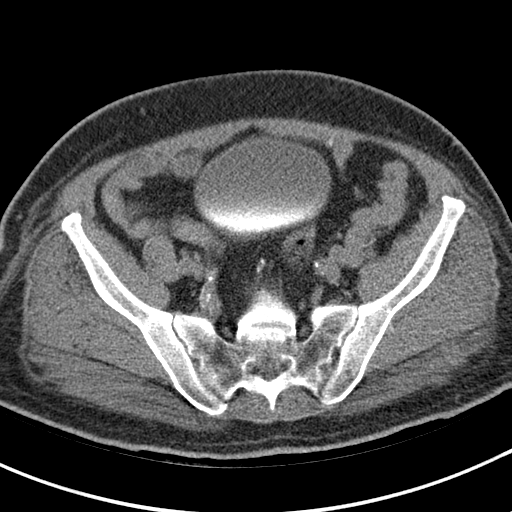
[im 41/65  bone]
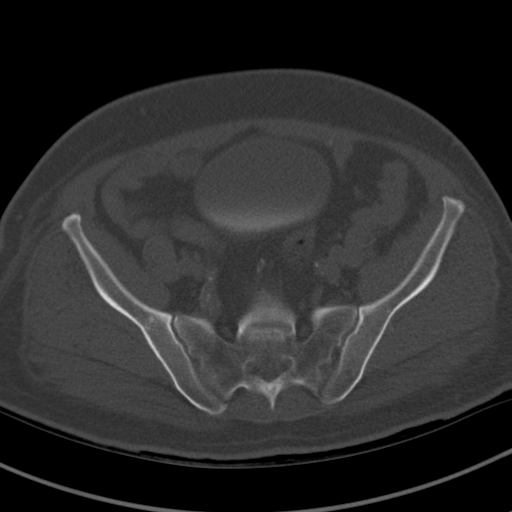
[im 47/65  soft-tissue]
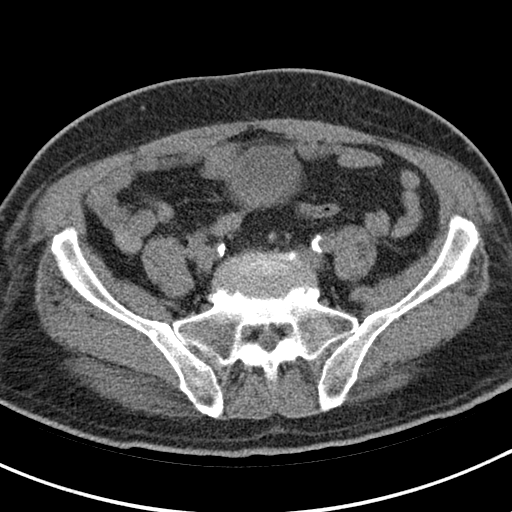
[im 52/65  soft-tissue]
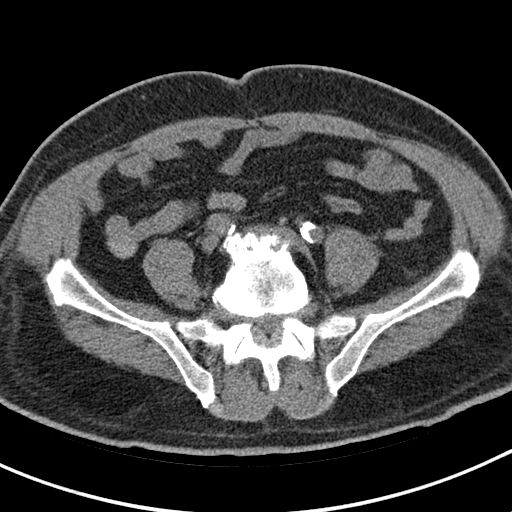
[im 54/65  lung]
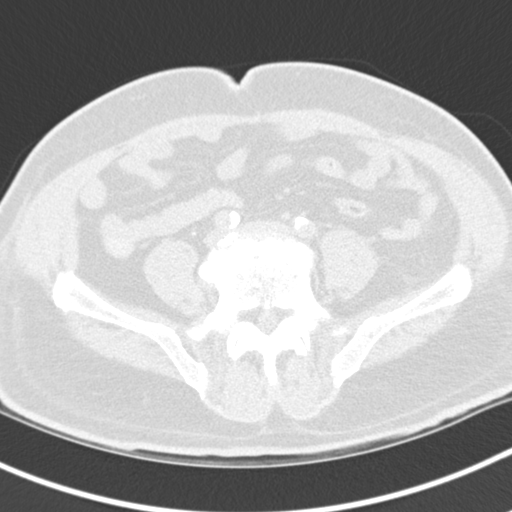
[im 57/65  soft-tissue]
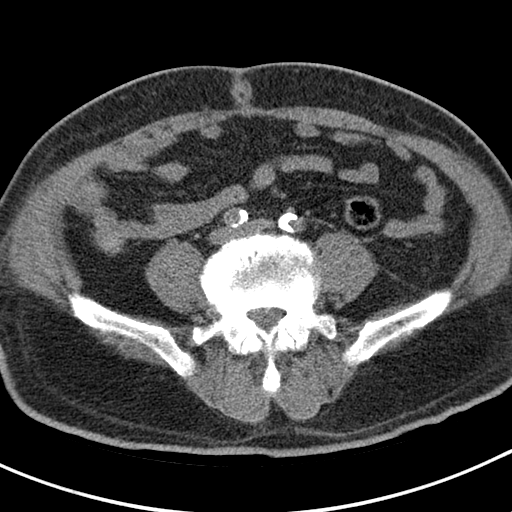
[im 57/65  lung]
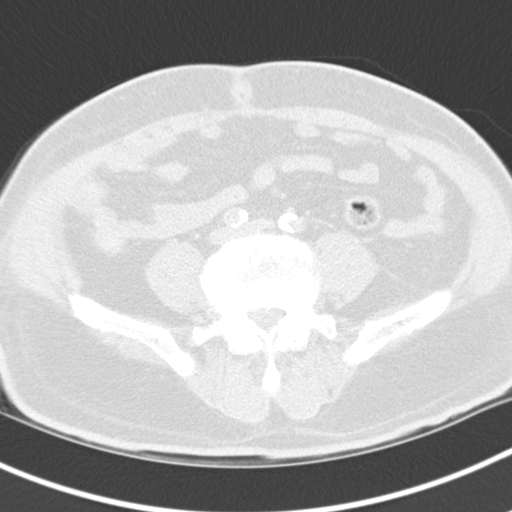
[im 59/65  lung]
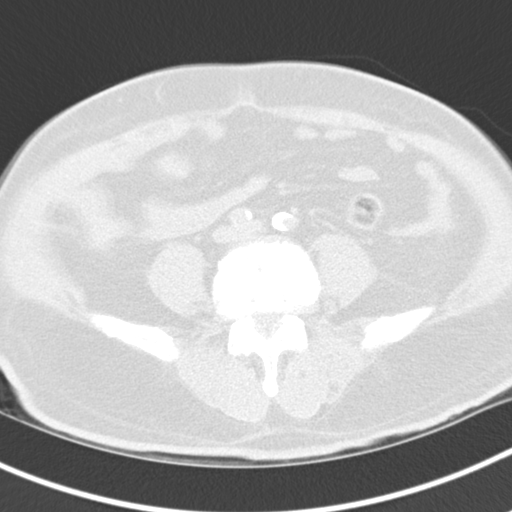
[im 62/65  soft-tissue]
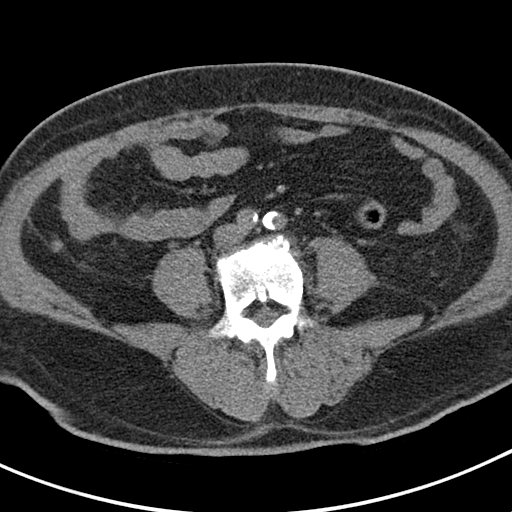
[im 62/65  lung]
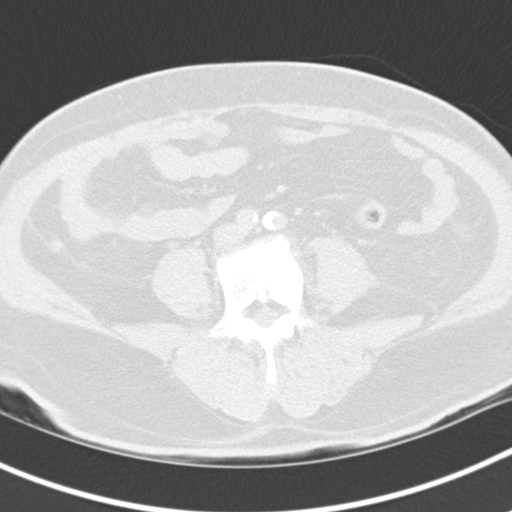

[15 of 32 positions shown; findings below may reference images not displayed]

ANESTHESIA/SEDATION:
Intravenous Fentanyl [IF] and Versed 1mg were administered as
conscious sedation during continuous monitoring of the patient's
level of consciousness and physiological / cardiorespiratory status
by the radiology RN, with a total moderate sedation time of 10
minutes.

PROCEDURE:
Informed written consent was obtained from the spouse after a
thorough discussion of the procedural risks, benefits and
alternatives. All questions were addressed. Maximal Sterile Barrier
Technique was utilized including caps, mask, sterile gowns, sterile
gloves, sterile drape, hand hygiene and skin antiseptic. A timeout
was performed prior to the initiation of the procedure.

Select axial CT scans were obtained through the urinary bladder. An
appropriate skin entry site was determined and marked. The bladder
was distended noted to be adequately distended. Skin site was
prepped with chlorhexidine, draped in usual sterile fashion,
infiltrated locally with 1% lidocaine.

Under intermittent CT fluoroscopic guidance, an 18 gauge trocar
needle was advanced into the urinary bladder. Urine returned through
the needle hub. Amplatz guidewire advanced easily, position
confirmed on CT. Tract dilated to facilitate placement of a 12
French pigtail catheter, formed within the lumen of the urinary
bladder. Position confirmed on CT. Catheter secured externally with
0 Prolene suture and StatLock and placed to gravity drain bag. The
patient tolerated the procedure well.

COMPLICATIONS:
None immediate.
IMPRESSION: 1. Technically successful CT-guided suprapubic catheter placement.

## 2019-04-12 IMAGING — DX DG CHEST 1V PORT
1 series · 1 of 1 positions shown · non-contrast
Comparison: Chest radiograph [DATE]

CLINICAL DATA: Respiratory failure.

EXAM:
PORTABLE CHEST 1 VIEW

[chest ap]
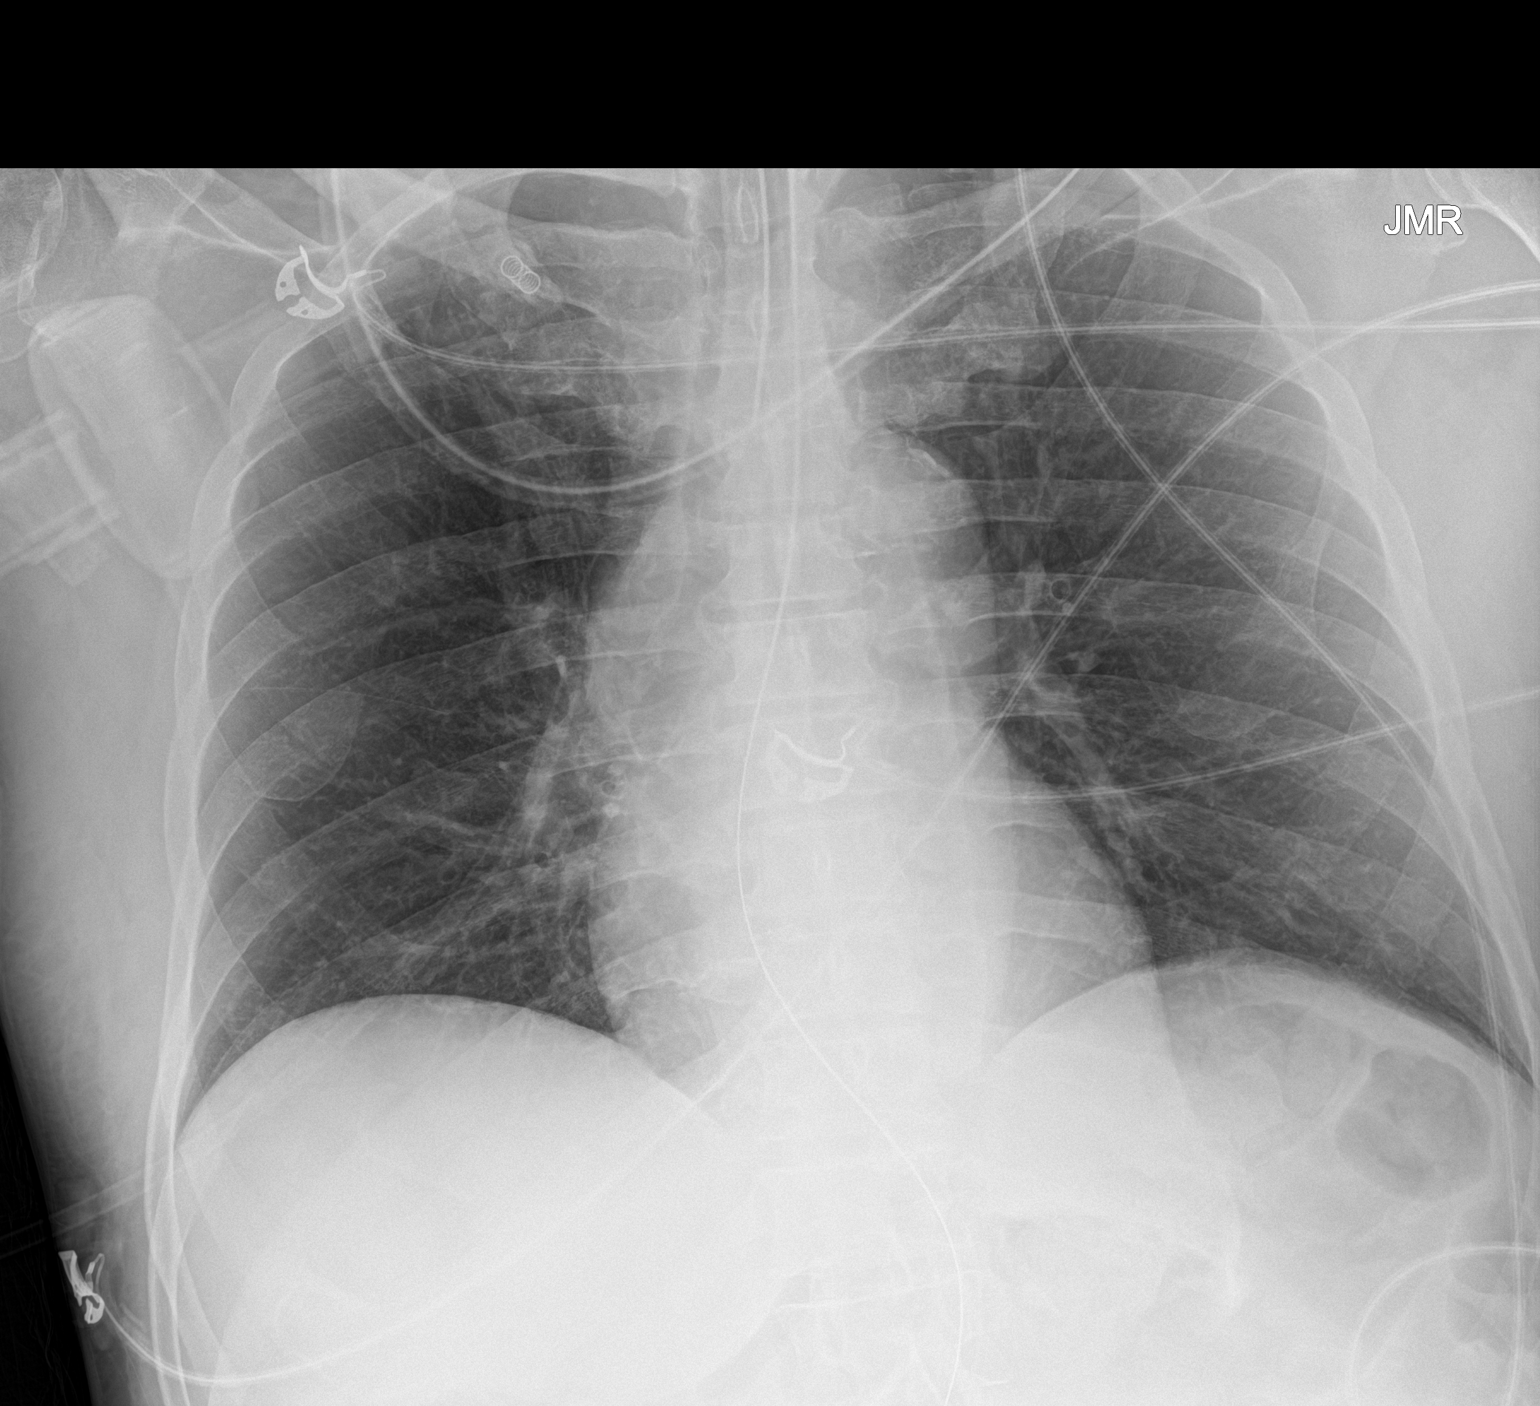

[1 of 1 positions shown; findings below may reference images not displayed]

FINDINGS: ET tube mid trachea. Enteric tube courses inferior to the diaphragm.
Monitoring leads overlie the patient. Stable cardiac and mediastinal
contours. Minimal right basilar atelectasis. No pleural effusion or
pneumothorax.
IMPRESSION: 1. Stable support apparatus.
2. No acute cardiopulmonary process.

## 2019-04-12 MED ORDER — MIDAZOLAM HCL 2 MG/2ML IJ SOLN
INTRAMUSCULAR | Status: AC | PRN
  Administered 2019-04-12: 1 mg via INTRAVENOUS

## 2019-04-12 MED ORDER — LIDOCAINE HCL 1 % IJ SOLN
INTRAMUSCULAR | Status: AC
  Filled 2019-04-12: qty 20

## 2019-04-12 MED ORDER — VITAL AF 1.2 CAL PO LIQD
1000.0000 mL | ORAL | Status: DC
  Administered 2019-04-12 – 2019-04-13 (×2): 1000 mL

## 2019-04-12 MED ORDER — VITAL HIGH PROTEIN PO LIQD
1000.0000 mL | ORAL | Status: DC
  Administered 2019-04-12: 10:00:00 1000 mL

## 2019-04-12 MED ORDER — FENTANYL CITRATE (PF) 100 MCG/2ML IJ SOLN
INTRAMUSCULAR | Status: AC | PRN
  Administered 2019-04-12: 50 ug via INTRAVENOUS

## 2019-04-12 MED ORDER — FENTANYL CITRATE (PF) 100 MCG/2ML IJ SOLN
INTRAMUSCULAR | Status: AC
  Administered 2019-04-12: 18:00:00
  Filled 2019-04-12: qty 2

## 2019-04-12 MED ORDER — MIDAZOLAM HCL 2 MG/2ML IJ SOLN
INTRAMUSCULAR | Status: AC
  Administered 2019-04-12: 18:00:00
  Filled 2019-04-12: qty 4

## 2019-04-12 MED ORDER — PRO-STAT SUGAR FREE PO LIQD
30.0000 mL | Freq: Two times a day (BID) | ORAL | Status: DC
  Administered 2019-04-12: 30 mL
  Filled 2019-04-12: qty 30

## 2019-04-12 NOTE — Progress Notes (Signed)
Transported pt to CT scan and backto [id number] without complication.

## 2019-04-12 NOTE — H&P (Signed)
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 with placement of suprapubic catheter today by Dr. Vernard Gambles.  Risks and benefits discussed with the patient including bleeding, infection, damage to adjacent structures.  All of the patient's questions were answered, patient is agreeable to proceed. Consent signed and in chart.  Thank you for this interesting consult.  I greatly enjoyed meeting Jermaine Mitchell and look forward to participating in their care.  A copy of this report was sent to the requesting provider on this date.  Electronically Signed: Murrell Redden, PA-C   04/12/2019, 3:22 PM      I spent a total of 40 Minutes in face to face in clinical consultation, greater than 50% of which was counseling/coordinating care for suprapubic catheter placement

## 2019-04-12 NOTE — Progress Notes (Signed)
NAME:  Jermaine Mitchell, MRN:  [id number], DOB:  April 16, 1941, LOS: 2 ADMISSION DATE:  04/20/2019, CONSULTATION DATE: 04/11/2019 REFERRING MD: Dr. Cheral Marker, CHIEF COMPLAINT: Respiratory failure  Brief History   78 yo male presented to ER with 4 days of difficulty walking and Lt side weakness, followed by 2 days of dysarthria and double vision.  Found to have chronic small vessel ischemia an old lacunar infarct on initial CT head.  MRI brain showed acute infarct in b/l ventral medulla, Lt cerebellum, and Rt perisylvian cortex.  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  Developed worsening neuro status with Rt sided weakness 11/05, and had cerebral angiogram.  Extubated post procedure, but then unable to protect airway.  Developed hypothermia, hypoxia and required intubation.  Noted to have hemoptysis after intubation and hypotension.  Pt unable to provide history.  History from medical record and medical team.   Past Medical History   Past Medical History:  Diagnosis Date  . Anemia   . Diabetes mellitus type II 1990  . Diabetic nephropathy ([place])   . Diabetic neuropathy ([place])    sees optho every 6 months  . Elevated PSA 08/13/2016  . Erectile dysfunction   . Hyperlipidemia   . Hyperlipidemia, mixed 06/15/2008   Qualifier: Diagnosis of  By: Redmond Pulling MD, Frann Rider    . Hypertension    with some white coat HTN on top of essential HTN  . Low back pain 08/10/2016  . Preventative health care 08/24/2013  . Stroke ([place]) 04/2019  . Tinea corporis 02/14/2013  . Urinary hesitancy 08/26/2015     Significant Hospital Events   11/04 Admit, start heparin gtt 11/05 worsening neuro status 11/5 balloon angioplasty of preocclusive mid Cervical RT VA   11/06 cerebral angiogram, extubated after procedure, unable to control airway and reintubated with hemoptysis and hypotension after procedure  Consults:  Neurology Neuro IR   Procedures:  Endotracheal tube 11/6>>  Significant Diagnostic Tests:  CT head 11/04 >> Atrophy, moderate small vessel ischemic changes of the white matter, chronic lacunar infarcts b/l BG and thalamus MRI brain 11/05 >> small acute b/l ventral medulla, lt superior cerebellum, and Rt perisylvian cortex infarcts Echo 11/05 >> EF 60 to 65%, mild LVH, grade 1 DD  Micro Data:  SARS CoV2 11/05 >> negative Sputum 11/06 >>   Antimicrobials:   Interim history/subjective:  Failed weaning this morning Urinary retention-unable to pass Foley-resistance  Objective   Blood pressure (!) 172/87, pulse 82, temperature 98.5 F (36.9 C), temperature source Oral, resp. rate (!) 22, height 5\' 9"  (1.753 m), weight 76 kg, SpO2 100 %.    Vent Mode: PRVC FiO2 (%):  [40 %-100 %] 40 % Set Rate:  [22 bmp] 22 bmp Vt Set:  [560 mL] 560 mL PEEP:  [5 cmH20-12 cmH20] 5 cmH20 Plateau Pressure:  [15 cmH20-30 cmH20] 15 cmH20   Intake/Output Summary (Last 24 hours) at 04/12/2019 0836 Last data filed at 04/12/2019 0800 Gross per 24 hour  Intake 1736.8 ml  Output 500 ml  Net 1236.8 ml   Filed Weights   05/01/2019 0700 04/22/2019 0950  Weight: 74.7 kg 76 kg    Examination: General: Elderly, does not appear to be in distress, not interactive HENT: Normocephalic, moist oral mucosa, endotracheal tube in place Lungs: Clear breath sounds bilaterally Cardiovascular: S1-S2 appreciated Abdomen: Soft, bowel sounds appreciated Extremities: No clubbing, no edema Neuro: Eyes open, occasional blinks, minimal response  to pain left upper extremity GU: Urinary retention  Resolved Hospital Problem list     Assessment & Plan:  Acute hypoxic respiratory failure with compromised airway secondary to CVA Hemoptysis with concern for aspiration pneumonitis Continue ventilator support Weaning as tolerated VAP in place Repeat ABG  Acute CVA status post cerebral angiogram 11/5 Management per neurology and neuro  interventional radiology Will defer resumption of antiplatelets, anticoagulants to neurology Will monitor for hemoptysis, at present not contraindicated to resume above  Hypertension Hyperlipidemia Continue Lipitor Hold antihypertensives  Type 2 diabetes SSI  Acute kidney injury on chronic kidney disease Chronic kidney disease Urinary retention-inability to place Foley Trend electrolytes Avoid nephrotoxic Consult urology-placed Repeat BMP at 1400 hrs.  Anemia of critical illness Trend CBC Transfuse if hemoglobin less than 7 or significant bleeding  Acute metabolic encephalopathy RASS goal of 0 to -1  Goals of care Discussions ongoing Continue aggressive care at present  Best practice:  Diet: We will start tube feeding Pain/Anxiety/Delirium protocol (if indicated): Fentanyl, midazolam VAP protocol (if indicated): In place DVT prophylaxis: SCD GI prophylaxis: Protonix Glucose control: SSI Mobility: Bedrest Code Status: Full code Family Communication: Will update Disposition: Neuro ICU  Labs   CBC: Recent Labs  Lab 04/17/2019 2020  04/11/19 0006 04/11/19 0047 04/11/19 0425 04/11/19 0835 04/12/19 0426  WBC 6.8  --   --   --  8.4  --  11.5*  NEUTROABS 4.5  --   --   --  6.1  --   --   HGB 11.9*   < > 7.5* 9.2* 10.3* 8.8* 8.6*  HCT 35.8*   < > 22.0* 27.0* 31.1* 26.0* 25.8*  MCV 83.1  --   --   --  82.5  --  82.7  PLT 232  --   --   --  195  --  193   < > = values in this interval not displayed.    Basic Metabolic Panel: Recent Labs  Lab 05/03/2019 2020 04/25/2019 2101 04/11/19 0006 04/11/19 0047 04/11/19 0425 04/11/19 0835 04/12/19 0426  NA 135 139 143 143 138 141 140  K 4.4 4.5 3.9 4.1 4.5 4.9 4.5  CL 107 107  --  113* 114*  --  115*  CO2 19*  --   --   --  17*  --  13*  GLUCOSE 176* 174*  --  154* 212*  --  141*  BUN 33* 36*  --  29* 33*  --  45*  CREATININE 2.08* 2.10*  --  1.80* 2.05*  --  3.42*  CALCIUM 9.1  --   --   --  8.1*  --  8.2*   GFR:  Estimated Creatinine Clearance: 18.4 mL/min (A) (by C-G formula based on SCr of 3.42 mg/dL (H)). Recent Labs  Lab 04/17/2019 2020 04/11/19 0425 04/12/19 0426  WBC 6.8 8.4 11.5*    Liver Function Tests: Recent Labs  Lab 04/16/2019 2020  AST 16  ALT 18  ALKPHOS 83  BILITOT 0.6  PROT 7.9  ALBUMIN 3.2*   No results for input(s): LIPASE, AMYLASE in the last 168 hours. No results for input(s): AMMONIA in the last 168 hours.  ABG    Component Value Date/Time   PHART 7.266 (L) 04/11/2019 0835   PCO2ART 30.4 (L) 04/11/2019 0835   PO2ART 258.0 (H) 04/11/2019 0835   HCO3 13.8 (L) 04/11/2019 0835   TCO2 15 (L) 04/11/2019 0835   ACIDBASEDEF 12.0 (H) 04/11/2019 0835   O2SAT 100.0  04/11/2019 0835     Coagulation Profile: Recent Labs  Lab 04/21/2019 2020  INR 1.1     CBG: Recent Labs  Lab 04/11/19 1248 04/11/19 1604 04/11/19 2057 04/12/19 0323 04/12/19 0819  GLUCAP 238* 185* 160* 130* 138*    Social History   reports that he has quit smoking. He has never used smokeless tobacco. He reports that he does not drink alcohol or use drugs.   Family History   His family history includes Alzheimer's disease in his brother; Aneurysm in his father; Arthritis in his daughter; Cancer in his brother, brother, sister, and sister; Diabetes in his mother and sister; Berenice Primas' disease in his daughter; Heart disease in his brother; Hypertension in his brother, daughter, daughter, and sister; Lupus in his daughter; Sjogren's syndrome in his daughter; Stroke in his mother, sister, and sister.   Allergies Allergies  Allergen Reactions  . Lisinopril Swelling and Other (See Comments)    Lip swelling   . Codeine Other (See Comments)    "Makes me feel funny."     The patient is critically ill with multiple organ systems failure and requires high complexity decision making for assessment and support, frequent evaluation and titration of therapies, application of advanced monitoring technologies  and extensive interpretation of multiple databases. Critical Care Time devoted to patient care services described in this note independent of APP/resident time (if applicable)  is 32 minutes.   Sherrilyn Rist MD [facility] Personal pager: [phone] If unanswered, please page CCM On-call: [phone]

## 2019-04-12 NOTE — Progress Notes (Signed)
STROKE TEAM PROGRESS NOTE   INTERVAL HISTORY RN at bedside. Dr. Erlinda Hong yesterday reviewed MRI and MRA with wife at bedside. Pt with open eyes briefly but not following any commands or tracking. No movement of any extremities. Off neo and sedation. Only on NS. Pt had hemoptysis pre and post intubation, Resolved bloody suctioning, restarted brilinta. Continue ASA.   Vitals:   04/12/19 1136 04/12/19 1200 04/12/19 1300 04/12/19 1330  BP: (!) 156/88 (!) 154/84  (!) 163/91  Pulse: 86 86 86 91  Resp: (!) 24 20 (!) 26 17  Temp:  (S) (!) 94.5 F (34.7 C)    TempSrc:  Rectal    SpO2: 100% 100% 100% 100%  Weight:      Height:        CBC:  Recent Labs  Lab 04/08/2019 2020  04/11/19 0425  04/12/19 0426 04/12/19 0937 04/12/19 1145  WBC 6.8  --  8.4  --  11.5*  --   --   NEUTROABS 4.5  --  6.1  --   --   --   --   HGB 11.9*   < > 10.3*   < > 8.6* 7.5* 7.5*  HCT 35.8*   < > 31.1*   < > 25.8* 22.0* 22.0*  MCV 83.1  --  82.5  --  82.7  --   --   PLT 232  --  195  --  193  --   --    < > = values in this interval not displayed.    Basic Metabolic Panel:  Recent Labs  Lab 04/11/19 0425  04/12/19 0426 04/12/19 0911 04/12/19 0937 04/12/19 1145  NA 138   < > 140  --  143 143  K 4.5   < > 4.5  --  4.4 4.6  CL 114*  --  115*  --   --   --   CO2 17*  --  13*  --   --   --   GLUCOSE 212*  --  141*  --   --   --   BUN 33*  --  45*  --   --   --   CREATININE 2.05*  --  3.42*  --   --   --   CALCIUM 8.1*  --  8.2*  --   --   --   MG  --   --   --  1.7  --   --   PHOS  --   --   --  3.5  --   --    < > = values in this interval not displayed.   Lipid Panel:     Component Value Date/Time   CHOL 194 04/11/2019 0426   TRIG 94 04/12/2019 0426   HDL 33 (L) 04/11/2019 0426   CHOLHDL 5.9 04/11/2019 0426   VLDL 9 04/11/2019 0426   LDLCALC 152 (H) 04/11/2019 0426   HgbA1c:  Lab Results  Component Value Date   HGBA1C 9.0 (H) 04/11/2019   Urine Drug Screen: No results found for: LABOPIA,  COCAINSCRNUR, LABBENZ, AMPHETMU, THCU, LABBARB  Alcohol Level No results found for: ETH  IMAGING  Ct Head Wo Contrast 04/11/2019 IMPRESSION:  1. Grossly stable small acute ischemic infarcts involving the ventral medulla, left cerebellum, and right perisylvian cortex. No evidence for hemorrhagic transformation or other complication.  2. No other new acute intracranial abnormality.   Mr Brain 25 Contrast Mr Angio Head Wo Contrast 04/11/2019 IMPRESSION:  New punctate acute infarct of the inferior left cerebellum. Slight increase in extent of evolving acute ventral medullary infarction. Similar evolving acute left cerebellar and right posterior temporal infarcts. No hemorrhage. Posterior circulation atherosclerotic narrowing is similar in appearance.   Dg Chest Port 1 View 04/11/2019 IMPRESSION:  Tube positions as described without pneumothorax. Note that the nasogastric tube side port is at the gastroesophageal junction. Advise advancing nasogastric tube 4-5 cm. Lungs clear. Heart size normal. Aortic Atherosclerosis (ICD10-I70.0).   Vas [place] Lower Extremity Venous (dvt) 04/09/2019 Summary:  Right: There is no evidence of deep vein thrombosis in the lower extremity. No cystic structure found in the popliteal fossa.  Left: There is no evidence of deep vein thrombosis in the lower extremity. No cystic structure found in the popliteal fossa.   Final    Cerebral Angiogram and Intervention - Interventional Neuro Radiology  04/11/2019 S/P RT CCA and RT VA angiograms followed by balloon angioplasty of preocclusive mid  Cervical RT VA  With improved flow into the RT VBJ and basilar artery and retrogradely into the Lt VBJ Approx 50 % stenosis of prox RT VA at origin. Approx 75 % stenosis of prox basilar artery and rt RT VBJ distal to RT PICA   PHYSICAL EXAM  Temp:  [94.5 F (34.7 C)-99 F (37.2 C)] 94.5 F (34.7 C) (11/07 1200) Pulse Rate:  [79-96] 91 (11/07 1330) Resp:  [15-26] 17 (11/07  1330) BP: (122-172)/(65-98) 163/91 (11/07 1330) SpO2:  [10 %-100 %] 100 % (11/07 1330) FiO2 (%):  [40 %-50 %] 40 % (11/07 1200)  General - Well nourished, well developed, intubated off sedation.  Ophthalmologic - fundi not visualized due to non cooperation and small pupils  Cardiovascular - Regular rate and rhythm.  Neuro - intubated off sedation and pressor, eyes open, not following commands.  eyes in mid position slightly disconjugate, not blinking to visual threat,  not tracking, pupils small . Patient does blink when trying to perform corneal testing, gag and cough present. Breathing not over the vent.  Facial symmetry not able to test due to ET tube.  Tongue protrusion not cooperative. On pain stimulation, no movement in all extremities. DTR diminished and toes mute. Sensation, coordination and gait not tested.   ASSESSMENT/PLAN Mr. Corri Blauer is a 78 y.o. male with history of DM2, erectile dysfunction, HLD and HTN presenting with 4 days of difficulty ambulating with left sided weakness, followed by 2 days of dysarthria.   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 Small vessel disease. Atrophy. Old basal ganglia and thalamic infarcts.  MRI  Small B ventral medullary, left pontine, left cerebellar and R perisylvian cortex infarcts. Small vessel disease.  MRA  Intracranial atherosclerosis w/ high-grade R V4 stenosis and moderate to advanced narrowing proximal BA  LE venous Doppler  No DVT   2D Echo EF 60-65%. No source of embolus    Neuro worsening with quadriplegia and intelligible dysarthria -> Stat CT neg for hemorrhage  Cerebral angio w/ Dr. Estanislado Pandy angioplasty mid cervical R VA w/ improved flow. Proximal R VA origin 50%  stenosis. Proximal BA and R VBJ 75% stenosis.   MRI New punctate acute infarct of the inferior left cerebellum. Increase in extent of evolving acute ventral medullary infarction. Similar evolving  acute left cerebellar and right posterior temporal infarcts.   LDL 109  HgbA1c 10.0  IV heparin for VTE prophylaxis  aspirin 81 mg daily prior to admission, now on aspirin 81 mg daily and brilinta (ticagrelor) 90 mg bid. Yesterday held off brilinta for 24 hours due to hemoptysis post intubation, restart    Therapy recommendations:  pending  Disposition:  pending   Acute Respiratory Failure Hemoptysis w/ concern for aspiration PNA  Intubated for IR, extubated, then desaturated, unable to protect airway  CCM consulted  Re-intubated  CXR lungs clear  On vent   hemoptysis resolved, hgb/hct stable - will restart brilinta   Back on aspirin 81 mg daily and brilinta (ticagrelor) 90 mg bid  Hypertension Hypotension post intubation  Stable BP goal 130-160 BP as low as 59/24 . Was on neo, now off  . SBP 140s -> 170s today  Hyperlipidemia  Home meds:  lipitor 80, resumed in hospital  LDL 109, goal < 70  Continue statin at discharge  Diabetes type II Uncontrolled  HgbA1c 10.0, goal < 7.0  CBGs  SSI  Hyperglycemia  Close PCP follow up  R groin hematoma s/p IR Acute blood loss Anemia post IR  Level 2  Pressure held  Hgb 12.9->10.3->8.8->8.6 (continue to monitor)  Back on aspirin 81 mg daily and brilinta (ticagrelor) 90 mg bid  CBC monitoring (recheck in AM.  Other Stroke Risk Factors  Advanced age  Former Cigarette smoker  Family hx stroke (mother, sister x 2)  Other Active Problems  CKD stage III, Cre 2.10->2.05->3.42 - recheck in AM - likely secondary to contrast during cerebral angiogram with intervention, appreciate Urology recs    Hypothermia 90.3->91.1->99->98.5 - on warming blanket - likely due to medullary infarct   Hospital day # 2  This  patient is critically ill due to brainstem infarct, posterior circulation occlusion / stenosis, respiratory failure, hypothermia and at significant risk of neurological worsening, death form recurrent stroke, cardiac arrest, seizure, heart failure. This patient's care requires constant monitoring of vital signs, hemodynamics, respiratory and cardiac monitoring, review of multiple databases, neurological assessment, discussion with family, other specialists and medical decision making of high complexity. I spent 30 minutes of neurocritical care time in the care of this patient.    04/12/2019 2:01 PM    To contact Stroke Continuity provider, please refer to [email or web address] After hours, contact General Neurology

## 2019-04-12 NOTE — Plan of Care (Signed)
  Problem: Ischemic Stroke/TIA Tissue Perfusion: Goal: Complications of ischemic stroke/TIA will be minimized Outcome: Not Progressing   

## 2019-04-12 NOTE — Progress Notes (Signed)
Patient has voided only 100cc of urine in the last 10hrs. Bladder scan revealed >400cc. Dr Genevive Bi with CCM contacted and order to place Foley was given due to recent bleeding episodes and acute retention. Foley insertion failed due to abnormal male anatomy. Dr Genevive Bi notified and Urology consult advised. RN will continue to monitor. KG RN

## 2019-04-12 NOTE — Progress Notes (Signed)
Nutrition Follow-up  DOCUMENTATION CODES:   Not applicable  INTERVENTION:  -Vital AF 1.2 @ 60 ml/hr (1440 ml/day)   Tube feeding regimen provides 1728 kcal, 108 grams of protein, and 1166 ml of H2O.   NUTRITION DIAGNOSIS:   Inadequate oral intake related to inability to eat as evidenced by NPO status. ongoing  GOAL:   Patient will meet greater than or equal to 90% of their needs progressing   MONITOR:   Vent status, Labs, Weight trends, Skin, I & O's  REASON FOR ASSESSMENT:   Consult, Ventilator Enteral/tube feeding initiation and management  ASSESSMENT:  RD working remotely.  78 year old male who presented to the ED on 11/04 with blurred vision, slurred speech, and LUE weakness. PMH of T2DM, HLD, HTN, CHF, anemia. MRI revealed small acute infarcts bilateral ventral medulla, left superior cerebellum, and right perisylvian cortex. MRA revealedhigh-grade stenosis of the dominant right V4 segment and moderate to advanced narrowing of the proximal basilar.  11/06 - s/p emergent diagnostic angiogram, balloon angioplasty, extubated post-procedure, developed hypothermia, hypoxia and required  reintubation.   Pt failed weaning trial this morning; unable to extubate today.  Unable to obtain diet and weight history at this time. Reviewed weight history in chart. Weight appears stable over the last year.  Patient is currently intubated on ventilator support MV: 10.8 L/min Temp (24hrs), Avg:97 F (36.1 C), Min:94.5 F (34.7 C), Max:99 F (37.2 C) I/Os: +1892 ml since admit     +1167 ml x 24 hrs UOP: 250 ml x 24 hrs.   Medications reviewed and include: SSI, Protonix Drips: NaCl Levo Labs: CBGS 130-166, BUN 45 (H), Cr 3.42 (H), Hgb 7.5 (L)  Diet Order:   Diet Order            Diet NPO time specified  Diet effective now              EDUCATION NEEDS:   No education needs have been identified at this time  Skin:  Skin Assessment: Skin Integrity Issues: Skin  Integrity Issues:: Incisions Incisions: groin  Last BM:  04/06/2019  Height:   Ht Readings from Last 1 Encounters:  04/23/2019 5\' 9"  (1.753 m)    Weight:   Wt Readings from Last 1 Encounters:  04/30/2019 76 kg    Ideal Body Weight:  72.7 kg  BMI:  Body mass index is 24.74 kg/m.  Estimated Nutritional Needs:   Kcal:  [id number]  Protein:  99-114  Fluid:  >/= 1.7 L/day   Lajuan Lines, RD, LDN Clinical Nutrition [place] Telephone [phone] After Hours/Weekend Pager: [phone]

## 2019-04-12 NOTE — Procedures (Signed)
Procedure NOTE  Indications: 78 y.o. male with HTN, HLD, CHF, DM, stroke, known severe phimosis, remote reports of BPH with inability to visualize or locate his urethral meatus; need for urinary drainage given acute renal failure.  Pre-operative Diagnosis: Urinary retention, phimosis  Post-operative Diagnosis: Same  Surgeon: Nancee Liter, MD  Assistants: None  Procedure Details  Consent was verbally obtained from wife via phone. Physical exam revealed dense phimosis, unable to retract. Attempt at foley catheter passage had been unsuccessful given inability to locate urethral meatus. Visually not able to identify normal penile glans tissue or meatus. Patient was in the supine position, prepped with Betadine and draped in the usual sterile fashion. Flexible cystourethroscopy was attempted with a 17Fr scope. The meatus was unable to be identified or entered. The scope passed around the prepucial and glans area - glans was pale white, dense fibrotic tissue noted through the scope, no normal contour to glans tip or ability to locate meatus. Probed with Sensor wire gently and no passage. After through attempt, the procedure was aborted.                  Complications: None; patient tolerated the procedure well.  Plan:   1.  Continue condom catheter for now.  2.  Keep NPO, stop TF, plan for IR SPT placment       Attending Attestation: Dr. Gloriann Loan was available.
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
## 2019-04-12 NOTE — Plan of Care (Signed)
  Problem: Pain Managment: Goal: General experience of comfort will improve Outcome: Progressing   

## 2019-04-12 NOTE — Procedures (Signed)
  Procedure: Suprapubic catheter placement under CT 41f EBL:   minimal Complications:  none immediate  See full dictation in [facility].  Dillard Cannon MD Main # [phone] Pager  [phone]

## 2019-04-13 LAB — BASIC METABOLIC PANEL
Anion gap: 12 (ref 5–15)
BUN: 68 mg/dL — ABNORMAL HIGH (ref 8–23)
CO2: 13 mmol/L — ABNORMAL LOW (ref 22–32)
Calcium: 7.9 mg/dL — ABNORMAL LOW (ref 8.9–10.3)
Chloride: 117 mmol/L — ABNORMAL HIGH (ref 98–111)
Creatinine, Ser: 4.97 mg/dL — ABNORMAL HIGH (ref 0.61–1.24)
GFR calc Af Amer: 12 mL/min — ABNORMAL LOW (ref >60)
GFR calc non Af Amer: 10 mL/min — ABNORMAL LOW (ref >60)
Glucose, Bld: 238 mg/dL — ABNORMAL HIGH (ref 70–99)
Potassium: 4.6 mmol/L (ref 3.5–5.1)
Sodium: 142 mmol/L (ref 135–145)

## 2019-04-13 LAB — GLUCOSE, CAPILLARY
Glucose-Capillary: 230 mg/dL — ABNORMAL HIGH (ref 70–99)
Glucose-Capillary: 201 mg/dL — ABNORMAL HIGH (ref 70–99)
Glucose-Capillary: 205 mg/dL — ABNORMAL HIGH (ref 70–99)

## 2019-04-13 LAB — CBC
HCT: 24.5 % — ABNORMAL LOW (ref 39.0–52.0)
Hemoglobin: 7.9 g/dL — ABNORMAL LOW (ref 13.0–17.0)
MCH: 27.5 pg (ref 26.0–34.0)
MCHC: 32.2 g/dL (ref 30.0–36.0)
MCV: 85.4 fL (ref 80.0–100.0)
Platelets: 174 10*3/uL (ref 150–400)
RBC: 2.87 MIL/uL — ABNORMAL LOW (ref 4.22–5.81)
RDW: 16.4 % — ABNORMAL HIGH (ref 11.5–15.5)
WBC: 10 10*3/uL (ref 4.0–10.5)
nRBC: 0 % (ref 0.0–0.2)

## 2019-04-13 LAB — CULTURE, RESPIRATORY W GRAM STAIN: Culture: NORMAL

## 2019-04-13 LAB — PHOSPHORUS: Phosphorus: 2.9 mg/dL (ref 2.5–4.6)

## 2019-04-13 LAB — MAGNESIUM: Magnesium: 1.8 mg/dL (ref 1.7–2.4)

## 2019-04-13 MED ORDER — DIPHENHYDRAMINE HCL 50 MG/ML IJ SOLN: 25.0000 mg | INTRAMUSCULAR | Status: DC | PRN

## 2019-04-13 MED ORDER — MORPHINE SULFATE (PF) 2 MG/ML IV SOLN: 2.0000 mg | INTRAVENOUS | Status: DC | PRN

## 2019-04-13 MED ORDER — ACETAMINOPHEN 325 MG PO TABS: 650.0000 mg | ORAL_TABLET | Freq: Four times a day (QID) | ORAL | Status: DC | PRN

## 2019-04-13 MED ORDER — LORAZEPAM 2 MG/ML IJ SOLN
2.0000 mg | INTRAMUSCULAR | Status: DC | PRN
  Administered 2019-04-13: 15:00:00 4 mg via INTRAVENOUS
  Filled 2019-04-13: qty 2

## 2019-04-13 MED ORDER — GLYCOPYRROLATE 0.2 MG/ML IJ SOLN: 0.2000 mg | INTRAMUSCULAR | Status: DC | PRN

## 2019-04-13 MED ORDER — MORPHINE 100MG IN NS 100ML (1MG/ML) PREMIX INFUSION
0.0000 mg/h | INTRAVENOUS | Status: DC
  Administered 2019-04-13: 16:00:00 20 mg/h via INTRAVENOUS
  Filled 2019-04-13: qty 100

## 2019-04-13 MED ORDER — DEXTROSE 5 % IV SOLN
INTRAVENOUS | Status: DC
  Administered 2019-04-13: 15:00:00 via INTRAVENOUS

## 2019-04-13 MED ORDER — GLYCOPYRROLATE 1 MG PO TABS: 1.0000 mg | ORAL_TABLET | ORAL | Status: DC | PRN

## 2019-04-13 MED ORDER — INSULIN ASPART 100 UNIT/ML ~~LOC~~ SOLN
4.0000 [IU] | SUBCUTANEOUS | Status: DC
  Administered 2019-04-13 (×2): 4 [IU] via SUBCUTANEOUS

## 2019-04-13 MED ORDER — INSULIN ASPART 100 UNIT/ML ~~LOC~~ SOLN
0.0000 [IU] | SUBCUTANEOUS | Status: DC
  Administered 2019-04-13: 3 [IU] via SUBCUTANEOUS
  Administered 2019-04-13 (×2): 5 [IU] via SUBCUTANEOUS

## 2019-04-13 MED ORDER — MORPHINE BOLUS VIA INFUSION
5.0000 mg | INTRAVENOUS | Status: DC | PRN
  Filled 2019-04-13: qty 5

## 2019-04-13 MED ORDER — POLYVINYL ALCOHOL 1.4 % OP SOLN
1.0000 [drp] | Freq: Four times a day (QID) | OPHTHALMIC | Status: DC | PRN
  Filled 2019-04-13: qty 15

## 2019-04-13 MED ORDER — ACETAMINOPHEN 650 MG RE SUPP: 650.0000 mg | Freq: Four times a day (QID) | RECTAL | Status: DC | PRN

## 2019-04-14 ENCOUNTER — Encounter (HOSPITAL_COMMUNITY): Payer: Self-pay | Admitting: Interventional Radiology

## 2019-04-14 LAB — GLUCOSE, CAPILLARY: Glucose-Capillary: 138 mg/dL — ABNORMAL HIGH (ref 70–99)

## 2019-04-17 ENCOUNTER — Encounter (HOSPITAL_COMMUNITY): Payer: Self-pay

## 2019-05-06 NOTE — Procedures (Signed)
Extubation Procedure Note  Patient Details:   Name: Jermaine Mitchell DOB: August 17, 1940 MRN: [id number]   Airway Documentation:    Vent end date: 28-Apr-2019 Vent end time: 1609   Evaluation  O2 sats: stable throughout Complications: No apparent complications Patient did tolerate procedure well. Bilateral Breath Sounds: Rhonchi, Diminished   No   Pt was extubated per MD order.   Ronaldo Miyamoto 28-Apr-2019, 4:10 PM

## 2019-05-06 NOTE — Progress Notes (Signed)
STROKE TEAM PROGRESS NOTE   INTERVAL HISTORY RN at bedside. Dr. Erlinda Hong reviewed MRI and MRA with wife at bedside. Poor prognosis. Neuro stable today. Pt with open eyes briefly but not following any commands or tracking. No movement of any extremities. Off neo and sedation. Only on NS. Pt had hemoptysis pre and post intubation, Resolved bloody suctioning, restarted brilinta. Continue ASA.   Vitals:   April 27, 2019 0900 04/27/2019 1000 Apr 27, 2019 1100 04-27-19 1148  BP: (!) 141/74 (!) 142/74 134/72   Pulse: (!) 101 97 99   Resp: (!) 26 (!) 24 15   Temp:      TempSrc:      SpO2: 100% 100% 100% 100%  Weight:      Height:        CBC:  Recent Labs  Lab 05/03/2019 2020  04/11/19 0425  04/12/19 0426  04/12/19 1145 04/27/19 0554  WBC 6.8  --  8.4  --  11.5*  --   --  10.0  NEUTROABS 4.5  --  6.1  --   --   --   --   --   HGB 11.9*   < > 10.3*   < > 8.6*   < > 7.5* 7.9*  HCT 35.8*   < > 31.1*   < > 25.8*   < > 22.0* 24.5*  MCV 83.1  --  82.5  --  82.7  --   --  85.4  PLT 232  --  195  --  193  --   --  174   < > = values in this interval not displayed.    Basic Metabolic Panel:  Recent Labs  Lab 04/12/19 1617 27-Apr-2019 0554  NA 141 142  K 4.4 4.6  CL 117* 117*  CO2 14* 13*  GLUCOSE 199* 238*  BUN 55* 68*  CREATININE 3.98* 4.97*  CALCIUM 8.0* 7.9*  MG 1.7 1.8  PHOS 3.8 2.9   Lipid Panel:     Component Value Date/Time   CHOL 194 04/11/2019 0426   TRIG 94 04/12/2019 0426   HDL 33 (L) 04/11/2019 0426   CHOLHDL 5.9 04/11/2019 0426   VLDL 9 04/11/2019 0426   LDLCALC 152 (H) 04/11/2019 0426   HgbA1c:  Lab Results  Component Value Date   HGBA1C 9.0 (H) 04/11/2019   Urine Drug Screen: No results found for: LABOPIA, COCAINSCRNUR, LABBENZ, AMPHETMU, THCU, LABBARB  Alcohol Level No results found for: ETH  IMAGING  Ct Head Wo Contrast 04/12/2019 IMPRESSION:  1. Grossly stable small acute ischemic infarcts involving the ventral medulla, left cerebellum, and right perisylvian cortex.  No evidence for hemorrhagic transformation or other complication.  2. No other new acute intracranial abnormality.   Mr Brain 24 Contrast Mr Angio Head Wo Contrast 04/11/2019 IMPRESSION:  New punctate acute infarct of the inferior left cerebellum. Slight increase in extent of evolving acute ventral medullary infarction. Similar evolving acute left cerebellar and right posterior temporal infarcts. No hemorrhage. Posterior circulation atherosclerotic narrowing is similar in appearance.   Dg Chest Port 1 View 04/11/2019 IMPRESSION:  Tube positions as described without pneumothorax. Note that the nasogastric tube side port is at the gastroesophageal junction. Advise advancing nasogastric tube 4-5 cm. Lungs clear. Heart size normal. Aortic Atherosclerosis (ICD10-I70.0).   Vas [place] Lower Extremity Venous (dvt) 04/24/2019 Summary:  Right: There is no evidence of deep vein thrombosis in the lower extremity. No cystic structure found in the popliteal fossa.  Left: There is no evidence of deep vein  thrombosis in the lower extremity. No cystic structure found in the popliteal fossa.   Final    Cerebral Angiogram and Intervention - Interventional Neuro Radiology  04/11/2019 S/P RT CCA and RT VA angiograms followed by balloon angioplasty of preocclusive mid  Cervical RT VA  With improved flow into the RT VBJ and basilar artery and retrogradely into the Lt VBJ Approx 50 % stenosis of prox RT VA at origin. Approx 75 % stenosis of prox basilar artery and rt RT VBJ distal to RT PICA   PHYSICAL EXAM  Temp:  [93.1 F (33.9 C)-100.1 F (37.8 C)] 100.1 F (37.8 C) (11/08 0800) Pulse Rate:  [72-110] 99 (11/08 1100) Resp:  [13-28] 15 (11/08 1100) BP: (116-163)/(58-91) 134/72 (11/08 1100) SpO2:  [100 %] 100 % (11/08 1148) FiO2 (%):  [40 %-100 %] 40 % (11/08 1148) Weight:  [77.4 kg] 77.4 kg (11/08 0500)  General - Well nourished, well developed, intubated off sedation.  Ophthalmologic - fundi not  visualized due to non cooperation and small pupils  Cardiovascular - Regular rate and rhythm.  Neuro - intubated off sedation and pressor, eyes open, not following commands.  eyes in mid position slightly disconjugate, not blinking to visual threat,  not tracking, pupils small . Patient does blink when trying to perform corneal testing, gag and cough present. Breathing not over the vent.  Facial symmetry not able to test due to ET tube.  Tongue protrusion not cooperative. On pain stimulation, no movement in all extremities. DTR diminished and toes mute. Sensation, coordination and gait not tested.   ASSESSMENT/PLAN Mr. Jermaine Mitchell is a 78 y.o. male with history of DM2, erectile dysfunction, HLD and HTN presenting with 4 days of difficulty ambulating with left sided weakness, followed by 2 days of dysarthria.   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 Small vessel disease. Atrophy. Old basal ganglia and thalamic infarcts.  MRI  Small B ventral medullary, left pontine, left cerebellar and R perisylvian cortex infarcts. Small vessel disease.  MRA  Intracranial atherosclerosis w/ high-grade R V4 stenosis and moderate to advanced narrowing proximal BA  LE venous Doppler  No DVT   2D Echo EF 60-65%. No source of embolus    Neuro worsening with quadriplegia and intelligible dysarthria -> Stat CT neg for hemorrhage  Cerebral angio w/ Dr. Estanislado Pandy angioplasty mid cervical R VA w/ improved flow. Proximal R VA origin 50% stenosis. Proximal BA and R VBJ 75% stenosis.   MRI New punctate acute infarct of the inferior left cerebellum. Increase in extent of evolving acute ventral medullary infarction. Similar evolving acute left cerebellar and right  posterior temporal infarcts.   LDL 109  HgbA1c 10.0  IV heparin for VTE prophylaxis  aspirin 81 mg daily prior to admission, now on aspirin 81 mg daily and brilinta (ticagrelor) 90 mg bid. Yesterday held off brilinta for 24 hours due to hemoptysis post intubation, restart    Therapy recommendations:  pending  Disposition:  pending   Acute Respiratory Failure Hemoptysis w/ concern for aspiration PNA  Intubated for IR, extubated, then desaturated, unable to protect airway  CCM consulted  Re-intubated  CXR lungs clear  On vent   hemoptysis resolved, hgb/hct stable - will restart brilinta   Back on aspirin 81  mg daily and brilinta (ticagrelor) 90 mg bid  Hypertension Hypotension post intubation  Stable BP goal 130-160 BP as low as 59/24 . Was on neo, now off  . SBP 140s -> 170s today  Hyperlipidemia  Home meds:  lipitor 80, resumed in hospital  LDL 109, goal < 70  Continue statin at discharge  Diabetes type II Uncontrolled  HgbA1c 10.0, goal < 7.0  CBGs  SSI  Hyperglycemia  Close PCP follow up  R groin hematoma s/p IR Acute blood loss Anemia post IR  Level 2  Pressure held  Hgb 12.9->10.3->8.8->8.6 (continue to monitor)  Back on aspirin 81 mg daily and brilinta (ticagrelor) 90 mg bid  CBC monitoring (recheck in AM.  Other Stroke Risk Factors  Advanced age  Former Cigarette smoker  Family hx stroke (mother, sister x 2)  Other Active Problems  CKD stage III, Cre 2.10->2.05->3.42->4.97 - recheck in AM - likely secondary to contrast during cerebral angiogram with intervention, appreciate Urology recs    Hypothermia 90.3->91.1->99->98.5 - on warming blanket - likely due to medullary infarct   Hospital day # 3  This patient is critically ill due to brainstem infarct, posterior circulation occlusion / stenosis, respiratory failure, hypothermia and at significant risk of neurological worsening, death form recurrent stroke, cardiac arrest,  seizure, heart failure. This patient's care requires constant monitoring of vital signs, hemodynamics, respiratory and cardiac monitoring, review of multiple databases, neurological assessment, discussion with family, other specialists and medical decision making of high complexity. I spent 30 minutes of neurocritical care time in the care of this patient.    04-19-2019 11:51 AM    To contact Stroke Continuity provider, please refer to [email or web address] After hours, contact General Neurology

## 2019-05-06 NOTE — Progress Notes (Signed)
Wasted 59mL morphine in sink with Candy Sledge RN

## 2019-05-06 NOTE — Progress Notes (Signed)
Spoke with patient's spouse and family members at bedside Dr. Jaynee Eagles did discuss with them her earlier  Goal is comfort measures  Withdrawal of life-sustaining care will be initiated per patients wishes

## 2019-05-06 NOTE — Progress Notes (Signed)
CDS referral made. Referral number [id number].

## 2019-05-06 NOTE — Progress Notes (Signed)
Death certificate signed.  -- Amie Portland, MD [facility] Pager: [phone] If 7pm to 7am, please call on call as listed on AMION.

## 2019-05-06 NOTE — Progress Notes (Signed)
NAME:  Jermaine Mitchell, MRN:  [id number], DOB:  [birth year], LOS: 3 ADMISSION DATE:  05/02/2019, CONSULTATION DATE: 04/11/2019 REFERRING MD: Dr. Cheral Marker, CHIEF COMPLAINT: Respiratory failure  Brief History   78 yo male presented to ER with 4 days of difficulty walking and Lt side weakness, followed by 2 days of dysarthria and double vision.  Found to have chronic small vessel ischemia an old lacunar infarct on initial CT head.  MRI brain showed acute infarct in b/l ventral medulla, Lt cerebellum, and Rt perisylvian cortex.  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  Developed worsening neuro status with Rt sided weakness 11/05, and had cerebral angiogram.  Extubated post procedure, but then unable to protect airway.  Developed hypothermia, hypoxia and required intubation.  Noted to have hemoptysis after intubation and hypotension.  Pt unable to provide history.  History from medical record and medical team.   Past Medical History   Past Medical History:  Diagnosis Date  . Anemia   . Diabetes mellitus type II 1990  . Diabetic nephropathy ([place])   . Diabetic neuropathy ([place])    sees optho every 6 months  . Elevated PSA 08/13/2016  . Erectile dysfunction   . Hyperlipidemia   . Hyperlipidemia, mixed 06/15/2008   Qualifier: Diagnosis of  By: Redmond Pulling MD, Frann Rider    . Hypertension    with some white coat HTN on top of essential HTN  . Low back pain 08/10/2016  . Preventative health care 08/24/2013  . Stroke ([place]) 04/2019  . Tinea corporis 02/14/2013  . Urinary hesitancy 08/26/2015     Significant Hospital Events   11/04 Admit, start heparin gtt 11/05 worsening neuro status 11/5 balloon angioplasty of preocclusive mid Cervical RT VA   11/06 cerebral angiogram, extubated after procedure, unable to control airway and reintubated with hemoptysis and hypotension after procedure  Consults:  Neurology Neuro IR  Urology  Procedures:  Endotracheal tube 11/6>>  Significant Diagnostic Tests:  CT head 11/04 >> Atrophy, moderate small vessel ischemic changes of the white matter, chronic lacunar infarcts b/l BG and thalamus MRI brain 11/05 >> small acute b/l ventral medulla, lt superior cerebellum, and Rt perisylvian cortex infarcts Echo 11/05 >> EF 60 to 65%, mild LVH, grade 1 DD  Micro Data:  SARS CoV2 11/05 >> negative Sputum 11/06 >>   Antimicrobials:   Interim history/subjective:  Failed weaning this morning Urinary retention-unable to pass Foley-resistance S/p suprapubic catheter placement  Objective   Blood pressure (!) 144/79, pulse (!) 107, temperature 97.7 F (36.5 C), temperature source Axillary, resp. rate (!) 26, height 5\' 9"  (1.753 m), weight 77.4 kg, SpO2 100 %.    Vent Mode: PRVC FiO2 (%):  [40 %-100 %] 40 % Set Rate:  [14 bmp] 14 bmp Vt Set:  [490 mL] 490 mL PEEP:  [5 cmH20] 5 cmH20 Pressure Support:  [10 cmH20] 10 cmH20 Plateau Pressure:  [14 cmH20-25 cmH20] 25 cmH20   Intake/Output Summary (Last 24 hours) at 04-24-19 1048 Last data filed at Apr 24, 2019 0800 Gross per 24 hour  Intake 2430.84 ml  Output 500 ml  Net 1930.84 ml   Filed Weights   04/15/2019 0700 04/27/2019 0950 04-24-19 0500  Weight: 74.7 kg 76 kg 77.4 kg    Examination: General: Elderly, does not appear to be in distress HENT: Normocephalic, moist oral mucosa, endotracheal tube in place Lungs: Clear breath sounds bilaterally Cardiovascular: S1-S2 appreciated Abdomen: Bowel sounds  appreciated, suprapubic catheter in place Extremities: No clubbing, no edema Neuro: Unresponsive  GU: Urinary retention-suprapubic catheter in place  Resolved Hospital Problem list     Assessment & Plan:  Acute hypoxic respiratory failure Hemoptysis with concern for aspiration pneumonia -Continue ventilator support -Weaning as tolerated -VAP in place  Acute CVA status post cerebral angiogram 11/5 -Management  per neurology -Defer resumption of antiplatelets anticoagulants neurology  Hypertension Hyperlipidemia Continue Lipitor Hold antihypertensives  Type 2 diabetes SSI  Acute kidney injury on chronic kidney disease S/p suprapubic catheter placement Trend electrolytes  Hypertension Hyperlipidemia Continue Lipitor Hold antihypertensives  Anemia of critical illness Trend CBC Transfuse if hemoglobin less than 7 or significant bleeding  Acute metabolic encephalopathy RASS goal of 0 to -1  Goals of care Discussions ongoing Continue aggressive care at present    Best practice:  Diet: Tube feeds Pain/Anxiety/Delirium protocol (if indicated): Fentanyl, midazolam VAP protocol (if indicated): In place DVT prophylaxis: SCD GI prophylaxis: Protonix Glucose control: SSI Mobility: Bedrest Code Status: Full code Family Communication: Will update Disposition: Neuro ICU  Labs   CBC: Recent Labs  Lab 04/07/2019 2020  04/11/19 0425 04/11/19 0835 04/12/19 0426 04/12/19 0937 04/12/19 1145 05/04/19 0554  WBC 6.8  --  8.4  --  11.5*  --   --  10.0  NEUTROABS 4.5  --  6.1  --   --   --   --   --   HGB 11.9*   < > 10.3* 8.8* 8.6* 7.5* 7.5* 7.9*  HCT 35.8*   < > 31.1* 26.0* 25.8* 22.0* 22.0* 24.5*  MCV 83.1  --  82.5  --  82.7  --   --  85.4  PLT 232  --  195  --  193  --   --  174   < > = values in this interval not displayed.    Basic Metabolic Panel: Recent Labs  Lab 05/02/2019 2020  04/11/19 0047 04/11/19 0425  04/12/19 0426 04/12/19 0911 04/12/19 0937 04/12/19 1145 04/12/19 1617 May 04, 2019 0554  NA 135   < > 143 138   < > 140  --  143 143 141 142  K 4.4   < > 4.1 4.5   < > 4.5  --  4.4 4.6 4.4 4.6  CL 107   < > 113* 114*  --  115*  --   --   --  117* 117*  CO2 19*  --   --  17*  --  13*  --   --   --  14* 13*  GLUCOSE 176*   < > 154* 212*  --  141*  --   --   --  199* 238*  BUN 33*   < > 29* 33*  --  45*  --   --   --  55* 68*  CREATININE 2.08*   < > 1.80* 2.05*  --   3.42*  --   --   --  3.98* 4.97*  CALCIUM 9.1  --   --  8.1*  --  8.2*  --   --   --  8.0* 7.9*  MG  --   --   --   --   --   --  1.7  --   --  1.7 1.8  PHOS  --   --   --   --   --   --  3.5  --   --  3.8 2.9   < > =  values in this interval not displayed.     CBG: Recent Labs  Lab 04/12/19 1613 04/12/19 2002 04/12/19 2349 04/30/19 0312 Apr 30, 2019 0734  GLUCAP 188* 180* 235* 230* 201*    The patient is critically ill with multiple organ systems failure and requires high complexity decision making for assessment and support, frequent evaluation and titration of therapies, application of advanced monitoring technologies and extensive interpretation of multiple databases. Critical Care Time devoted to patient care services described in this note independent of APP/resident time (if applicable)  is 30 minutes.   Sherrilyn Rist MD [facility] Personal pager: [phone] If unanswered, please page CCM On-call: [phone]

## 2019-05-06 NOTE — Discharge Summary (Signed)
DEATH SUMMARY   Patient Details  Name: Jermaine Mitchell MRN: [id number] DOB: Apr 24, 1941  Admission/Discharge Information   Admit Date:  2019/04/16  Date of Death: Date of Death: April 20, 2019  Time of Death: Time of Death: 09/04/23  Length of Stay: 3  Referring Physician: Mosie Lukes, MD   Reason(s) for Hospitalization  Patient was admitted with complaints of difficulty speaking, left-sided weakness.  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 year old male who was admitted 04/09/2019 with complaints of left-sided weakness, difficulty speaking. CT scan of the brain did not reveal any acute abnormality, MRI did reveal high-grade stenosis of the dominant right V4 segment and moderate to advanced narrowing of the proximal basilar. Patient did have neuro interventional radiology perform cerebral angiogram with balloon angioplasty with improved flow noted post procedure Remained on the ventilator. Patient was unable to wean off the ventilator Prognosis discussed with the family Patient family did make a decision to make him comfort measures only and patient was transition to comfort measures. Patient succumbed to his illness on 2019-04-20 at 1625 Pronounced at 1625   Pertinent Labs and Studies  Significant Diagnostic Studies Ct Head Wo Contrast  Result Date: 04/19/2019 CLINICAL DATA:  Follow-up examination for  acute stroke. EXAM: CT HEAD WITHOUT CONTRAST TECHNIQUE: Contiguous axial images were obtained from the base of the skull through the vertex without intravenous contrast. COMPARISON:  Prior MRI from earlier the same day. FINDINGS: Brain: Previously identified small acute ischemic infarcts involving the ventral medulla, left cerebellum, and right perisylvian cortex are grossly stable, better seen on recent MRI. No evidence for hemorrhagic transformation or other complication. No other acute large vessel territory infarct. No intracranial hemorrhage. Atrophy with chronic microvascular ischemic disease again noted. Superimposed remote lacunar infarcts seen involving the left basal ganglia and bilateral thalami. No mass lesion or midline shift. No hydrocephalus. No extra-axial fluid collection. Vascular: No hyperdense vessel. Calcified atherosclerosis at the skull base. Skull: Scalp soft tissues and calvarium within normal limits. Sinuses/Orbits: Globes and orbital soft tissues within normal limits. Paranasal sinuses remain largely clear. No significant mastoid effusion. Other: None. IMPRESSION: 1. Grossly stable small acute ischemic infarcts involving the ventral medulla, left cerebellum, and right perisylvian cortex. No evidence for hemorrhagic transformation or other complication. 2. No other new acute intracranial abnormality. Electronically Signed   By: Jeannine Boga M.D.   On: 04/24/2019 21:49   Ct Head Wo Contrast  Result Date: 04/16/19 CLINICAL DATA:  Generalized weakness EXAM: CT HEAD WITHOUT CONTRAST TECHNIQUE: Contiguous axial images were obtained from the base of the skull through the vertex without intravenous contrast. COMPARISON:  02/21/2019 head CT FINDINGS: Brain: No acute territorial infarction, hemorrhage or intracranial mass. Atrophy and moderate small vessel ischemic changes of the white matter. Chronic lacunar infarcts in the bilateral basal ganglia and thalamus. Stable ventricle size  Vascular: No hyperdense vessels.  Carotid vascular calcification Skull: Normal. Negative for fracture or focal lesion. Sinuses/Orbits: No acute  finding. Other: None IMPRESSION: 1. No CT evidence for acute intracranial abnormality. 2. Atrophy and small vessel ischemic changes of the white matter. Chronic appearing infarcts in the basal ganglia and thalamus Electronically Signed   By: Donavan Foil M.D.   On: 04/26/2019 21:57   Mr Angio Head Wo Contrast  Result Date: 04/11/2019 CLINICAL DATA:  Strokes post right vertebral artery angioplasty EXAM: MRI HEAD WITHOUT CONTRAST MRA HEAD WITHOUT CONTRAST TECHNIQUE: Multiplanar, multiecho pulse sequences of the brain and surrounding structures were obtained without intravenous contrast. Angiographic images of the head were obtained using MRA technique without contrast. COMPARISON:  April 10, 2019 FINDINGS: MRI HEAD FINDINGS Brain: Restricted diffusion is again identified within the ventral medulla bilaterally with slight increase in extent. Similar extent of areas of additional restricted diffusion within the left cerebellum and right posterior temporal lobe. A new punctate focus of restricted diffusion is present within the inferior left cerebellar hemisphere. No evidence of new hemorrhage. Focus of chronic blood products again identified within the right cerebellum. Patchy and confluent T2 hyperintensity in the supratentorial white likely reflects stable chronic microvascular ischemic changes. Chronic small infarcts are again noted within the cerebellum, brainstem, and thalami. Ventricles are stable in size. There is no extra-axial fluid collection. Vascular: Normal flow voids. Skull and upper cervical spine: Unremarkable. Sinuses/Orbits: Paranasal sinus inflammatory changes. Bilateral lens replacements. Other: None. MRA HEAD FINDINGS There is persistent narrowing of the intracranial right vertebral artery beyond the PICA origin. Stable small caliber of intracranial  left vertebral artery with patent left PICA. 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 Bilateral posterior communicating arteries are patent. There is a diminutive and likely significantly stenotic left P1 PCA. Posterior cerebral arteries are patent. Intracranial internal carotid arteries are patent with stable atherosclerotic irregularity of the cavernous and proximal supraclinoid portions. Middle and anterior cerebral arteries are patent. IMPRESSION: New punctate acute infarct of the inferior left cerebellum. Slight increase in extent of evolving acute ventral medullary infarction. Similar evolving acute left cerebellar and right posterior temporal infarcts. No hemorrhage. Posterior circulation atherosclerotic narrowing is similar in appearance. Electronically Signed   By: Macy Mis M.D.   On: 04/11/2019 12:44   Mr Angio Head Wo Contrast  Result Date: 04/09/2019 CLINICAL DATA:  Speech difficulty EXAM: MRA HEAD WITHOUT CONTRAST TECHNIQUE: Angiographic images of the Circle of Willis were obtained using MRA technique without intravenous contrast. COMPARISON:  Brain MRI from earlier today FINDINGS: Dominant right V4 segment with high-grade narrowing past the PICA. Much of left vertebral flow is into the left PICA which is patent on source images. Moderate to advanced proximal basilar stenosis. Fetal type flow in both posterior cerebral arteries. The left upper PCA branch is highly stenotic. Mild narrowing at the left P3 segment. Atherosclerotic irregularity along both carotid siphons with subjective moderate narrowing at the paraclinoid segment on the left. No branch occlusion, beading, or aneurysm. IMPRESSION: Intracranial atherosclerosis most notable in the posterior circulation where there is high-grade stenosis of the dominant right V4 segment and moderate to advanced narrowing of the proximal basilar. Electronically Signed   By: Monte Fantasia M.D.   On: 04/07/2019 06:28   Mr Brain Wo Contrast  Result Date: 04/11/2019 CLINICAL DATA:  Strokes post right vertebral artery angioplasty EXAM: MRI HEAD WITHOUT CONTRAST MRA HEAD WITHOUT CONTRAST TECHNIQUE: Multiplanar, multiecho pulse sequences of the brain and surrounding structures were obtained without intravenous contrast. Angiographic images of the head were obtained using MRA  technique without contrast. COMPARISON:  April 10, 2019 FINDINGS: MRI HEAD FINDINGS Brain: Restricted diffusion is again identified within the ventral medulla bilaterally with slight increase in extent. Similar extent of areas of additional restricted diffusion within the left cerebellum and right posterior temporal lobe. A new punctate focus of restricted diffusion is present within the inferior left cerebellar hemisphere. No evidence of new hemorrhage. Focus of chronic blood products again identified within the right cerebellum. Patchy and confluent T2 hyperintensity in the supratentorial white likely reflects stable chronic microvascular ischemic changes. Chronic small infarcts are again noted within the cerebellum, brainstem, and thalami. Ventricles are stable in size. There is no extra-axial fluid collection. Vascular: Normal flow voids. Skull and upper cervical spine: Unremarkable. Sinuses/Orbits: Paranasal sinus inflammatory changes. Bilateral lens replacements. Other: None. MRA HEAD FINDINGS There is persistent narrowing of the intracranial right vertebral artery beyond the PICA origin. Stable small caliber of intracranial left vertebral artery with patent left PICA. 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 Bilateral posterior communicating arteries are patent. There is a diminutive and likely significantly stenotic left P1 PCA. Posterior cerebral arteries are patent. Intracranial internal carotid arteries are patent with stable atherosclerotic  irregularity of the cavernous and proximal supraclinoid portions. Middle and anterior cerebral arteries are patent. IMPRESSION: New punctate acute infarct of the inferior left cerebellum. Slight increase in extent of evolving acute ventral medullary infarction. Similar evolving acute left cerebellar and right posterior temporal infarcts. No hemorrhage. Posterior circulation atherosclerotic narrowing is similar in appearance. Electronically Signed   By: Macy Mis M.D.   On: 04/11/2019 12:44   Mr Brain Wo Contrast  Result Date: 05/05/2019 CLINICAL DATA:  Speech difficulty. EXAM: MRI HEAD WITHOUT CONTRAST TECHNIQUE: Multiplanar, multiecho pulse sequences of the brain and surrounding structures were obtained without intravenous contrast. COMPARISON:  Head CT from yesterday FINDINGS: Brain: Restricted diffusion on both sides of the ventral medulla, correlating with history of weakness. Although it is unusual for brainstem infarcts to be bilateral, presence of small left superior cerebellar and right posterior temporal infarcts are compatible with multifocal infarct. There is a background of chronic small vessel ischemia with remote infarcts in the bilateral cerebellum and brainstem and in the bilateral deep gray nuclei, especially thalamus. Confluent chronic small vessel ischemic gliosis in the deep cerebral white matter. Age normal brain volume. Chronic blood products in the right cerebellum considered post ischemic. No generalized chronic hemorrhagic injury. Vascular: Normal flow voids. Skull and upper cervical spine: Negative for marrow lesion. Sinuses/Orbits: Negative IMPRESSION: 1. Small acute infarcts in the bilateral ventral medulla, left superior cerebellum, and right perisylvian cortex. 2. 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 Electronically Signed   By: Monte Fantasia M.D.   On: 04/17/2019 05:54   Ir Pta Intracranial  Result Date: 04/14/2019 INDICATION: Worsening neurological  function with bilateral upper and lower extremity weakness left greater than right, and also severe dysarthria. EXAM: 1. EMERGENT LARGE VESSEL OCCLUSION THROMBOLYSIS (POSTERIOR CIRCULATION) COMPARISON:  An MRI MRA of the brain of April 10, 2019. MEDICATIONS: Ancef 2 g IV antibiotic was administered within 1 hour of the procedure. ANESTHESIA/SEDATION: General anesthesia. CONTRAST:  Isovue 300 approximately 150 mL. FLUOROSCOPY TIME:  Fluoroscopy Time: 65 minutes 18 seconds (2384 mGy). COMPLICATIONS: None immediate. TECHNIQUE: Following a full explanation of the procedure along with the potential associated complications, an informed witnessed consent was obtained patient's spouse. The risks of intracranial hemorrhage of 10%, worsening neurological deficit, ventilator dependency, death and  inability to revascularize were all reviewed in detail with the patient's spouse. The patient was then put under general anesthesia by the Department of Anesthesiology at [facility]. The right groin was prepped and draped in the usual sterile fashion. Thereafter using modified Seldinger technique, transfemoral access into the right common femoral artery was obtained without difficulty. Over a 0.035 inch guidewire a 5 French Pinnacle sheath was inserted. Through this, and also over a 0.035 inch guidewire a 5 [place] JB 1 catheter was advanced to the aortic arch region and selectively positioned in the right subclavian artery and the right common carotid artery. FINDINGS: The right subclavian arteriogram demonstrates approximately 50% stenosis of the proximal 1/3 of the right vertebral artery. A selective right vertebral artery angiogram demonstrates the 50% right proximal vertebral artery stenosis with slow ascent of contrast to the cranial skull base. Severe near occlusive stenosis is seen at the mid cervical right vertebral artery level. Distal to this there is slow ascent of contrast to the right vertebral artery at the  cranial skull base. There is slow advancement of contrast in an antegrade fashion to the right vertebrobasilar junction and the proximal basilar artery. Poor filling of contrast is noted in the proximal basilar artery and the distal basilar artery. The right common carotid arteriogram demonstrates the right external carotid artery and its major branches to be widely patent. The right internal carotid artery at the bulb to the cranial skull base demonstrates a tortuous unimpeded course. The petrous, the cavernous and the supraclinoid segments are widely patent. There is mild to moderate stenosis of the petrous cavernous junction. A right posterior communicating artery is seen opacifying the right posterior cerebral distribution. The right middle cerebral artery and the right anterior cerebral artery opacify into the capillary and venous phases. PROCEDURE: The diagnostic JB 1 catheter in the distal right subclavian artery was then exchanged for a 0.035 inch 300 cm Rosen exchange guidewire for an 8 [place] Pinnacle sheath in the right groin and was connected to continuous heparinized saline infusion. Over the [facility] exchange guidewire, a 80 cm Neuron 7 guide catheter was advanced and positioned just proximal to the right vertebral artery origin. The exchange guidewire was removed. Good aspiration obtained from the hub of the Neuron guide catheter. Over a 0.014 inch standard Synchro micro guidewire, an 021 Phenom microcatheter was advanced to just distal to the tip of the Neuron guide catheter. Using a torque device, after multiple manipulations with micro guidewire tip, access was obtained into the right vertebral artery followed by the microcatheter to the distal right vertebral artery. The guidewire was removed. Good aspiration was obtained from the hub of the microcatheter. This in turn was then exchanged for a 014 inch Softip standard [employer] exchange micro guidewire. The micro guidewire tip had a J configuration to  avoid dissections or inducing spasm. The microcatheter was retrieved and removed. A control arteriogram performed through the Neuron guide catheter at the origin of the right vertebral artery demonstrated the proximal 50% stenosis at the proximal aspect of the right vertebral artery. 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 Measurements were performed of the right vertebral artery in its most normal segment. It was elected to proceed with balloon angioplasty to improve revascularization at the level of the pre occlusive mid cervical right vertebral artery stenosis. A Gateway 3 mm x 15 mm angioplasty balloon catheter was prepped and draped in the usual fashion. Using the over-the-wire  technique, this was then advanced and positioned with the distal and the proximal markers adequate distance from the site of severe stenosis. Control inflation was then performed to cover the proximal and distal portions of the long segment pre occlusive stenosis. Inflations were performed to approximately 3 mm where it was maintained for approximately 45 seconds. Upon deflation and more proximal angioplasty followed by deflation, control arteriogram performed through the Neuron guide catheter at the origin of the right vertebral artery demonstrated significantly improved caliber and flow hemodynamically to the right vertebrobasilar junction. There was now much clearer angiographic opacification of the right vertebrobasilar junction, the proximal basilar artery and also the distal basilar artery. High-grade stenosis was identified of the right vertebrobasilar junction just distal to the right posterior-inferior cerebellar artery, and also the proximal basilar artery. There was now opacification also noted of the superior cerebellar arteries and also the anterior-inferior cerebellar arteries bilaterally. Also demonstrated was retrograde opacification of the left  vertebrobasilar junction to the left posterior-inferior cerebellar artery. Also 70% degree stenosis was identified in the right vertebrobasilar junction distal to the right posterior-inferior cerebellar artery, and also the proximal basilar artery. There continued to be excellent flow unimpeded, however, through this to the distal basilar artery. Partial filling of the posterior cerebral arteries at the origin was also noted. Four attempts were made to access the distal right vertebrobasilar junction and proximal basilar artery as areas of approximately 70% narrowing for balloon angioplasty with a 1.5 mm x 15 mm Gateway balloon catheter. However, the balloon angioplasty microcatheter was not long enough to reach the two areas mentioned. It was also partially complicated by the inability to advance the Neuron guide catheter into the distal vertebral artery on account of the stenosis in the proximal right vertebral artery. This was despite using multiple different wires and micro catheters for support. Given that there was significant flow to the right vertebrobasilar junction and hypoplastic vertebrobasilar system on developmental basis by balloon angioplasty of the near occlusive mid cervical segment of the right vertebral artery it was decided to stop. The exchange micro guidewire was removed. A final control arteriogram performed through the Neuron guide catheter at the origin of the right vertebral artery continued to demonstrate excellent flow proximally and also distally. The patient was given a loading dose of aspirin of 81 mg, and Brilinta 180 mg prior to the balloon angioplasty. Also the patient was given a total of 2000 units of IV heparin. The Neuron guide catheter was removed. The 8 French Pinnacle sheath was then removed with hemostasis achieved with manual compression over about 30 minutes. Distal pulses remained Dopplerable in the posterior tibial, and the dorsalis pedis arteries bilaterally. Patient's  CT scan obtained of the brain demonstrated no evidence of hemorrhage, or mass effect or midline shift. The patient was then extubated. He was slow in recovering but eventually was able to open his eyes to command. He was breathing adequately on his own. However, there was minimal movement in his upper and lower extremities. He was then transferred to the neuro ICU for post revascularization management. 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 PLAN: Close follow-up with potential for proximal basilar artery and right vertebral artery angioplasty at a future time probably via the right radial artery route. Electronically Signed   By: Corky Downs.D.  On: 04/11/2019 16:10   [place]  Result Date: 04/11/2019 INDICATION: Worsening neurological function with bilateral upper and lower extremity weakness left greater than right, and also severe dysarthria.  EXAM: 1. EMERGENT LARGE VESSEL OCCLUSION THROMBOLYSIS (POSTERIOR CIRCULATION)  COMPARISON:  An MRI MRA of the brain of April 10, 2019.  MEDICATIONS: Ancef 2 g IV antibiotic was administered within 1 hour of the procedure.  ANESTHESIA/SEDATION: General anesthesia.  CONTRAST:  Isovue 300 approximately 150 mL.  FLUOROSCOPY TIME:  Fluoroscopy Time: 65 minutes 18 seconds (2384 mGy).  COMPLICATIONS: None immediate.  TECHNIQUE: Following a full explanation of the procedure along with the potential associated complications, an informed witnessed consent was obtained patient's spouse. The risks of intracranial hemorrhage of 10%, worsening neurological deficit, ventilator dependency, death and inability to revascularize were all reviewed in detail with the patient's spouse.  The patient was then put under general  anesthesia by the Department of Anesthesiology at [facility].  The right groin was prepped and draped in the usual sterile fashion. Thereafter using modified Seldinger technique, transfemoral access into the right common femoral artery was obtained without difficulty. Over a 0.035 inch guidewire a 5 French Pinnacle sheath was inserted. Through this, and also over a 0.035 inch guidewire a 5 [place] JB 1 catheter was advanced to the aortic arch region and selectively positioned in the right subclavian artery and the right common carotid artery.  FINDINGS: The right subclavian arteriogram demonstrates approximately 50% stenosis of the proximal 1/3 of the right vertebral artery.  A selective right vertebral artery angiogram demonstrates the 50% right proximal vertebral artery stenosis with slow ascent of contrast to the cranial skull base. Severe near occlusive stenosis is seen at the mid cervical right vertebral artery level.  Distal to this there is slow ascent of contrast to the right vertebral artery at the cranial skull base. There is slow advancement of contrast in an antegrade fashion to the right vertebrobasilar junction and the proximal basilar artery.  Poor filling of contrast is noted in the proximal basilar artery and the distal basilar artery.  The right common carotid arteriogram demonstrates the right external carotid artery and its major branches to be widely patent.  The right internal carotid artery at the bulb to the cranial skull base demonstrates a tortuous unimpeded course.  The petrous, the cavernous and the supraclinoid segments are widely patent.  There is mild to moderate stenosis of the petrous cavernous junction.  A right posterior communicating artery is seen opacifying the right posterior cerebral distribution.  The right middle cerebral artery and the right anterior cerebral artery opacify into the capillary and venous phases.  PROCEDURE: The diagnostic JB 1 catheter in  the distal right subclavian artery was then exchanged for a 0.035 inch 300 cm Rosen exchange guidewire for an 8 [place] Pinnacle sheath in the right groin and was connected to continuous heparinized saline infusion.  Over the [facility] exchange guidewire, a 80 cm Neuron 7 guide catheter was advanced and positioned just proximal to the right vertebral artery origin.  The exchange guidewire was removed. Good aspiration obtained from the hub of the Neuron guide catheter.  Over a 0.014 inch standard Synchro micro guidewire, an 021 Phenom microcatheter was advanced to just distal to the tip of the Neuron guide catheter.  Using a torque device, after multiple manipulations with micro guidewire tip, access was obtained into the right vertebral artery followed by the microcatheter to the distal right vertebral artery.  The guidewire  was removed. Good aspiration was obtained from the hub of the microcatheter. This in turn was then exchanged for a 014 inch Softip standard BMW exchange micro guidewire. The micro guidewire tip had a J configuration to avoid dissections or inducing spasm. The microcatheter was retrieved and removed. A control arteriogram performed through the Neuron guide catheter at the origin of the right vertebral artery demonstrated the proximal 50% stenosis at the proximal aspect of the right vertebral artery.  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  Measurements were performed of the right vertebral artery in its most normal segment. It was elected to proceed with balloon angioplasty to improve revascularization at the level of the pre occlusive mid cervical right vertebral artery stenosis.  A Gateway 3 mm x 15 mm angioplasty balloon catheter was prepped and draped in the usual fashion.  Using the over-the-wire technique, this was then advanced and positioned with the distal and the proximal markers adequate distance from the site  of severe stenosis.  Control inflation was then performed to cover the proximal and distal portions of the long segment pre occlusive stenosis.  Inflations were performed to approximately 3 mm where it was maintained for approximately 45 seconds. Upon deflation and more proximal angioplasty followed by deflation, control arteriogram performed through the Neuron guide catheter at the origin of the right vertebral artery demonstrated significantly improved caliber and flow hemodynamically to the right vertebrobasilar junction. There was now much clearer angiographic opacification of the right vertebrobasilar junction, the proximal basilar artery and also the distal basilar artery.  High-grade stenosis was identified of the right vertebrobasilar junction just distal to the right posterior-inferior cerebellar artery, and also the proximal basilar artery.  There was now opacification also noted of the superior cerebellar arteries and also the anterior-inferior cerebellar arteries bilaterally.  Also demonstrated was retrograde opacification of the left vertebrobasilar junction to the left posterior-inferior cerebellar artery.  Also 70% degree stenosis was identified in the right vertebrobasilar junction distal to the right posterior-inferior cerebellar artery, and also the proximal basilar artery. There continued to be excellent flow unimpeded, however, through this to the distal basilar artery.  Partial filling of the posterior cerebral arteries at the origin was also noted.  Four attempts were made to access the distal right vertebrobasilar junction and proximal basilar artery as areas of approximately 70% narrowing for balloon angioplasty with a 1.5 mm x 15 mm Gateway balloon catheter.  However, the balloon angioplasty microcatheter was not long enough to reach the two areas mentioned. It was also partially complicated by the inability to advance the Neuron guide catheter into the distal vertebral artery on  account of the stenosis in the proximal right vertebral artery.  This was despite using multiple different wires and micro catheters for support. Given that there was significant flow to the right vertebrobasilar junction and hypoplastic vertebrobasilar system on developmental basis by balloon angioplasty of the near occlusive mid cervical segment of the right vertebral artery it was decided to stop.  The exchange micro guidewire was removed. A final control arteriogram performed through the Neuron guide catheter at the origin of the right vertebral artery continued to demonstrate excellent flow proximally and also distally.  The patient was given a loading dose of aspirin of 81 mg, and Brilinta 180 mg prior to the balloon angioplasty. Also the patient was given a total of 2000 units of IV heparin.  The Neuron guide catheter was removed. The 8  [place] Pinnacle sheath was then removed with hemostasis achieved with manual compression over about 30 minutes.  Distal pulses remained Dopplerable in the posterior tibial, and the dorsalis pedis arteries bilaterally.  Patient's CT scan obtained of the brain demonstrated no evidence of hemorrhage, or mass effect or midline shift.  The patient was then extubated. He was slow in recovering but eventually was able to open his eyes to command. He was breathing adequately on his own. However, there was minimal movement in his upper and lower extremities.  He was then transferred to the neuro ICU for post revascularization management.  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  PLAN: Close follow-up with potential for proximal basilar artery and right vertebral artery angioplasty at a future time probably via the right radial artery route.    Electronically Signed   By: Luanne Bras M.D.   On: 04/11/2019 16:10   Dg Chest Port 1 View  Result Date: 04/12/2019 CLINICAL DATA:  Respiratory failure. EXAM: PORTABLE CHEST 1 VIEW COMPARISON:  Chest radiograph 04/11/2019 FINDINGS: ET tube mid trachea. Enteric tube courses inferior to the diaphragm. Monitoring leads overlie the patient. Stable cardiac and mediastinal contours. Minimal right basilar atelectasis. No pleural effusion or pneumothorax. IMPRESSION: 1. Stable support apparatus. 2. No acute cardiopulmonary process. Electronically Signed   By: Lovey Newcomer M.D.   On: 04/12/2019 10:02   Dg Chest Port 1 View  Result Date: 04/11/2019 CLINICAL DATA:  Hypoxia EXAM: PORTABLE CHEST 1 VIEW COMPARISON:  April 10, 2019 FINDINGS: Endotracheal tube tip is 3.8 cm above the carina. Nasogastric tube tip is in the proximal body of the stomach with the side port at the gastroesophageal junction. No pneumothorax. No edema or consolidation. Heart size and pulmonary vascularity are normal. No adenopathy. There is aortic atherosclerosis. No bone lesions. IMPRESSION: Tube positions as described without pneumothorax. Note that the nasogastric tube side port is at the gastroesophageal junction. Advise advancing nasogastric tube 4-5 cm. Lungs clear. Heart size normal. Aortic Atherosclerosis (ICD10-I70.0). Electronically Signed   By: Lowella Grip III M.D.   On: 04/11/2019 08:25   Dg Chest Port 1 View  Result Date: 04/30/2019 CLINICAL DATA:  Generalized weakness with slurred speech EXAM: PORTABLE CHEST 1 VIEW COMPARISON:  02/21/2019 FINDINGS: The heart size and mediastinal contours are within normal limits. Both lungs are clear. The visualized skeletal structures are unremarkable. IMPRESSION: No active disease. Electronically Signed   By: Monte Fantasia M.D.   On: 04/06/2019 04:18   Ct Image Guided Drainage By Percutaneous Catheter  Result Date: 04/19/19 CLINICAL DATA:  Urethral obstruction, urinary  retention, bladder distension. EXAM: CT GUIDED SUPRAPUBIC CATHETER PLACEMENT COMPARISON:  None. ANESTHESIA/SEDATION: Intravenous Fentanyl 93mcg and Versed 1mg  were administered as conscious sedation during continuous monitoring of the patient's level of consciousness and physiological / cardiorespiratory status by the radiology RN, with a total moderate sedation time of 10 minutes. PROCEDURE: Informed written consent was obtained from the spouse after a thorough discussion of the procedural risks, benefits and alternatives. All questions were addressed. Maximal Sterile Barrier Technique was utilized including caps, mask, sterile gowns, sterile gloves, sterile drape, hand hygiene and skin antiseptic. A timeout was performed prior to the initiation of the procedure. Select axial CT scans were obtained through the urinary bladder. An appropriate skin entry site was determined and marked.  The bladder was distended noted to be adequately distended. Skin site was prepped with chlorhexidine, draped in usual sterile fashion, infiltrated locally with 1% lidocaine. Under intermittent CT fluoroscopic guidance, an 18 gauge trocar needle was advanced into the urinary bladder. Urine returned through the needle hub. Amplatz guidewire advanced easily, position confirmed on CT. Tract dilated to facilitate placement of a 12 French pigtail catheter, formed within the lumen of the urinary bladder. Position confirmed on CT. Catheter secured externally with 0 Prolene suture and StatLock and placed to gravity drain bag. The patient tolerated the procedure well. COMPLICATIONS: None immediate. IMPRESSION: 1. Technically successful CT-guided suprapubic catheter placement. Electronically Signed   By: Lucrezia Europe M.D.   On: 02-May-2019 08:43   Vas [place] Lower Extremity Venous (dvt)  Result Date: 04/21/2019  Lower Venous Study Indications: Stroke.  Comparison Study: no prior Performing Technologist: Abram Sander RVS  Examination Guidelines: A  complete evaluation includes B-mode imaging, spectral Doppler, color Doppler, and power Doppler as needed of all accessible portions of each vessel. Bilateral testing is considered an integral part of a complete examination. Limited examinations for reoccurring indications may be performed as noted.  +---------+---------------+---------+-----------+----------+--------------+ RIGHT    CompressibilityPhasicitySpontaneityPropertiesThrombus Aging +---------+---------------+---------+-----------+----------+--------------+ CFV      Full           Yes      Yes                                 +---------+---------------+---------+-----------+----------+--------------+ SFJ      Full                                                        +---------+---------------+---------+-----------+----------+--------------+ FV Prox  Full                                                        +---------+---------------+---------+-----------+----------+--------------+ FV Mid   Full                                                        +---------+---------------+---------+-----------+----------+--------------+ FV DistalFull                                                        +---------+---------------+---------+-----------+----------+--------------+ PFV      Full                                                        +---------+---------------+---------+-----------+----------+--------------+ POP      Full           Yes      Yes                                 +---------+---------------+---------+-----------+----------+--------------+  PTV      Full                                                        +---------+---------------+---------+-----------+----------+--------------+ PERO     Full                                                        +---------+---------------+---------+-----------+----------+--------------+    +---------+---------------+---------+-----------+----------+--------------+ LEFT     CompressibilityPhasicitySpontaneityPropertiesThrombus Aging +---------+---------------+---------+-----------+----------+--------------+ CFV      Full           Yes      Yes                                 +---------+---------------+---------+-----------+----------+--------------+ SFJ      Full                                                        +---------+---------------+---------+-----------+----------+--------------+ FV Prox  Full                                                        +---------+---------------+---------+-----------+----------+--------------+ FV Mid   Full                                                        +---------+---------------+---------+-----------+----------+--------------+ FV DistalFull                                                        +---------+---------------+---------+-----------+----------+--------------+ PFV      Full                                                        +---------+---------------+---------+-----------+----------+--------------+ POP      Full           Yes      Yes                                 +---------+---------------+---------+-----------+----------+--------------+ PTV      Full                                                        +---------+---------------+---------+-----------+----------+--------------+  PERO     Full                                                        +---------+---------------+---------+-----------+----------+--------------+     Summary: Right: There is no evidence of deep vein thrombosis in the lower extremity. No cystic structure found in the popliteal fossa. Left: There is no evidence of deep vein thrombosis in the lower extremity. No cystic structure found in the popliteal fossa.  *See table(s) above for measurements and observations. Electronically signed by  Monica Martinez MD on 04/09/2019 at 3:38:12 PM.    Final    Ir Angio Intra Extracran Sel Com Carotid Innominate Uni R Mod Sed  Result Date: 04/11/2019 INDICATION: Worsening neurological function with bilateral upper and lower extremity weakness left greater than right, and also severe dysarthria.  EXAM: 1. EMERGENT LARGE VESSEL OCCLUSION THROMBOLYSIS (POSTERIOR CIRCULATION)  COMPARISON:  An MRI MRA of the brain of April 10, 2019.  MEDICATIONS: Ancef 2 g IV antibiotic was administered within 1 hour of the procedure.  ANESTHESIA/SEDATION: General anesthesia.  CONTRAST:  Isovue 300 approximately 150 mL.  FLUOROSCOPY TIME:  Fluoroscopy Time: 65 minutes 18 seconds (2384 mGy).  COMPLICATIONS: None immediate.  TECHNIQUE: Following a full explanation of the procedure along with the potential associated complications, an informed witnessed consent was obtained patient's spouse. The risks of intracranial hemorrhage of 10%, worsening neurological deficit, ventilator dependency, death and inability to revascularize were all reviewed in detail with the patient's spouse.  The patient was then put under general anesthesia by the Department of Anesthesiology at [facility].  The right groin was prepped and draped in the usual sterile fashion. Thereafter using modified Seldinger technique, transfemoral access into the right common femoral artery was obtained without difficulty. Over a 0.035 inch guidewire a 5 French Pinnacle sheath was inserted. Through this, and also over a 0.035 inch guidewire a 5 [place] JB 1 catheter was advanced to the aortic arch region and selectively positioned in the right subclavian artery and the right common carotid artery.  FINDINGS: The right subclavian arteriogram demonstrates approximately 50% stenosis of the proximal 1/3 of the right vertebral artery.  A selective right vertebral artery angiogram demonstrates the 50% right proximal vertebral artery stenosis with slow ascent  of contrast to the cranial skull base. Severe near occlusive stenosis is seen at the mid cervical right vertebral artery level.  Distal to this there is slow ascent of contrast to the right vertebral artery at the cranial skull base. There is slow advancement of contrast in an antegrade fashion to the right vertebrobasilar junction and the proximal basilar artery.  Poor filling of contrast is noted in the proximal basilar artery and the distal basilar artery.  The right common carotid arteriogram demonstrates the right external carotid artery and its major branches to be widely patent.  The right internal carotid artery at the bulb to the cranial skull base demonstrates a tortuous unimpeded course.  The petrous, the cavernous and the supraclinoid segments are widely patent.  There is mild to moderate stenosis of the petrous cavernous junction.  A right posterior communicating artery is seen opacifying the right posterior cerebral distribution.  The right middle cerebral artery and the right anterior cerebral artery opacify into the capillary and venous phases.  PROCEDURE: The diagnostic JB 1  catheter in the distal right subclavian artery was then exchanged for a 0.035 inch 300 cm Rosen exchange guidewire for an 8 [place] Pinnacle sheath in the right groin and was connected to continuous heparinized saline infusion.  Over the [facility] exchange guidewire, a 80 cm Neuron 7 guide catheter was advanced and positioned just proximal to the right vertebral artery origin.  The exchange guidewire was removed. Good aspiration obtained from the hub of the Neuron guide catheter.  Over a 0.014 inch standard Synchro micro guidewire, an 021 Phenom microcatheter was advanced to just distal to the tip of the Neuron guide catheter.  Using a torque device, after multiple manipulations with micro guidewire tip, access was obtained into the right vertebral artery followed by the microcatheter to the distal right vertebral  artery.  The guidewire was removed. Good aspiration was obtained from the hub of the microcatheter. This in turn was then exchanged for a 014 inch Softip standard BMW exchange micro guidewire. The micro guidewire tip had a J configuration to avoid dissections or inducing spasm. The microcatheter was retrieved and removed. A control arteriogram performed through the Neuron guide catheter at the origin of the right vertebral artery demonstrated the proximal 50% stenosis at the proximal aspect of the right vertebral artery.  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  Measurements were performed of the right vertebral artery in its most normal segment. It was elected to proceed with balloon angioplasty to improve revascularization at the level of the pre occlusive mid cervical right vertebral artery stenosis.  A Gateway 3 mm x 15 mm angioplasty balloon catheter was prepped and draped in the usual fashion.  Using the over-the-wire technique, this was then advanced and positioned with the distal and the proximal markers adequate distance from the site of severe stenosis.  Control inflation was then performed to cover the proximal and distal portions of the long segment pre occlusive stenosis.  Inflations were performed to approximately 3 mm where it was maintained for approximately 45 seconds. Upon deflation and more proximal angioplasty followed by deflation, control arteriogram performed through the Neuron guide catheter at the origin of the right vertebral artery demonstrated significantly improved caliber and flow hemodynamically to the right vertebrobasilar junction. There was now much clearer angiographic opacification of the right vertebrobasilar junction, the proximal basilar artery and also the distal basilar artery.  High-grade stenosis was identified of the right vertebrobasilar junction just distal to the right posterior-inferior  cerebellar artery, and also the proximal basilar artery.  There was now opacification also noted of the superior cerebellar arteries and also the anterior-inferior cerebellar arteries bilaterally.  Also demonstrated was retrograde opacification of the left vertebrobasilar junction to the left posterior-inferior cerebellar artery.  Also 70% degree stenosis was identified in the right vertebrobasilar junction distal to the right posterior-inferior cerebellar artery, and also the proximal basilar artery. There continued to be excellent flow unimpeded, however, through this to the distal basilar artery.  Partial filling of the posterior cerebral arteries at the origin was also noted.  Four attempts were made to access the distal right vertebrobasilar junction and proximal basilar artery as areas of approximately 70% narrowing for balloon angioplasty with a 1.5 mm x 15 mm Gateway balloon catheter.  However, the balloon angioplasty microcatheter was not long enough to reach the two areas mentioned. It was also partially complicated by the inability to advance the Neuron guide catheter into the distal vertebral artery on account  of the stenosis in the proximal right vertebral artery.  This was despite using multiple different wires and micro catheters for support. Given that there was significant flow to the right vertebrobasilar junction and hypoplastic vertebrobasilar system on developmental basis by balloon angioplasty of the near occlusive mid cervical segment of the right vertebral artery it was decided to stop.  The exchange micro guidewire was removed. A final control arteriogram performed through the Neuron guide catheter at the origin of the right vertebral artery continued to demonstrate excellent flow proximally and also distally.  The patient was given a loading dose of aspirin of 81 mg, and Brilinta 180 mg prior to the balloon angioplasty. Also the patient was given a total of 2000 units of IV  heparin.  The Neuron guide catheter was removed. The 8 French Pinnacle sheath was then removed with hemostasis achieved with manual compression over about 30 minutes.  Distal pulses remained Dopplerable in the posterior tibial, and the dorsalis pedis arteries bilaterally.  Patient's CT scan obtained of the brain demonstrated no evidence of hemorrhage, or mass effect or midline shift.  The patient was then extubated. He was slow in recovering but eventually was able to open his eyes to command. He was breathing adequately on his own. However, there was minimal movement in his upper and lower extremities.  He was then transferred to the neuro ICU for post revascularization management.  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  PLAN: Close follow-up with potential for proximal basilar artery and right vertebral artery angioplasty at a future time probably via the right radial artery route.   Electronically Signed   By: Luanne Bras M.D.   On: 04/11/2019 16:10    Microbiology No results found for this or any previous visit (from the past 240 hour(s)).  Lab Basic Metabolic Panel: No results for input(s): NA, K, CL, CO2, GLUCOSE, BUN, CREATININE, CALCIUM, MG, PHOS in the last 168 hours. Liver Function Tests: No results for input(s): AST, ALT, ALKPHOS, BILITOT, PROT, ALBUMIN in the last 168 hours. No results for input(s): LIPASE, AMYLASE in the last 168 hours. No results for input(s): AMMONIA in the last 168 hours. CBC: No results for input(s): WBC, NEUTROABS, HGB, HCT, MCV, PLT in the last 168 hours. Cardiac Enzymes: No results for input(s): CKTOTAL, CKMB, CKMBINDEX, TROPONINI in the last 168 hours. Sepsis Labs: No results for input(s): PROCALCITON, WBC,  LATICACIDVEN in the last 168 hours.  Procedures/Operations   Cerebral angiography 04/11/2019 Suprapubic catheter placement 04/12/2019   Adewale A Olalere 04/22/2019, 1:07 PM

## 2019-05-06 NOTE — Progress Notes (Signed)
eLink Physician-Brief Progress Note Patient Name: Jermaine Mitchell DOB: 05-Jul-1940 MRN: [id number]   Date of Service  Apr 17, 2019  HPI/Events of Note  Hyperglycemia - Blood glucose = 235 --> 230. Marland Kitchen   eICU Interventions  Will change to Q 4 hour moderate Novolog SSI + 4 units of Novolog Q 4 hours tube feed coverage.      Intervention Category Major Interventions: Hyperglycemia - active titration of insulin therapy  Ahlayah Tarkowski Eugene 04/17/19, 3:45 AM

## 2019-05-06 DEATH — deceased

## 2022-07-03 ENCOUNTER — Other Ambulatory Visit (HOSPITAL_COMMUNITY): Payer: Self-pay
